# Patient Record
Sex: Male | Born: 1955
Health system: Southern US, Community
[De-identification: ages and names within clinical notes are randomized; demographics above are authoritative.]

## PROBLEM LIST (undated history)

## (undated) DIAGNOSIS — N200 Calculus of kidney: Secondary | ICD-10-CM

## (undated) DIAGNOSIS — M719 Bursopathy, unspecified: Secondary | ICD-10-CM

## (undated) DIAGNOSIS — E669 Obesity, unspecified: Secondary | ICD-10-CM

## (undated) DIAGNOSIS — E039 Hypothyroidism, unspecified: Secondary | ICD-10-CM

## (undated) DIAGNOSIS — Z5189 Encounter for other specified aftercare: Secondary | ICD-10-CM

## (undated) DIAGNOSIS — K219 Gastro-esophageal reflux disease without esophagitis: Secondary | ICD-10-CM

## (undated) DIAGNOSIS — B1920 Unspecified viral hepatitis C without hepatic coma: Secondary | ICD-10-CM

## (undated) DIAGNOSIS — L4050 Arthropathic psoriasis, unspecified: Secondary | ICD-10-CM

## (undated) DIAGNOSIS — M779 Enthesopathy, unspecified: Secondary | ICD-10-CM

## (undated) DIAGNOSIS — J45909 Unspecified asthma, uncomplicated: Secondary | ICD-10-CM

## (undated) DIAGNOSIS — I471 Supraventricular tachycardia, unspecified: Secondary | ICD-10-CM

## (undated) DIAGNOSIS — H269 Unspecified cataract: Secondary | ICD-10-CM

## (undated) DIAGNOSIS — T7840XA Allergy, unspecified, initial encounter: Secondary | ICD-10-CM

## (undated) DIAGNOSIS — G629 Polyneuropathy, unspecified: Secondary | ICD-10-CM

## (undated) DIAGNOSIS — K2970 Gastritis, unspecified, without bleeding: Secondary | ICD-10-CM

## (undated) DIAGNOSIS — K449 Diaphragmatic hernia without obstruction or gangrene: Secondary | ICD-10-CM

## (undated) DIAGNOSIS — M329 Systemic lupus erythematosus, unspecified: Secondary | ICD-10-CM

## (undated) DIAGNOSIS — E079 Disorder of thyroid, unspecified: Secondary | ICD-10-CM

## (undated) DIAGNOSIS — IMO0002 Reserved for concepts with insufficient information to code with codable children: Secondary | ICD-10-CM

## (undated) DIAGNOSIS — I10 Essential (primary) hypertension: Secondary | ICD-10-CM

## (undated) DIAGNOSIS — T859XXA Unspecified complication of internal prosthetic device, implant and graft, initial encounter: Secondary | ICD-10-CM

## (undated) DIAGNOSIS — J439 Emphysema, unspecified: Secondary | ICD-10-CM

## (undated) DIAGNOSIS — R011 Cardiac murmur, unspecified: Secondary | ICD-10-CM

## (undated) DIAGNOSIS — C801 Malignant (primary) neoplasm, unspecified: Secondary | ICD-10-CM

## (undated) DIAGNOSIS — M1990 Unspecified osteoarthritis, unspecified site: Secondary | ICD-10-CM

## (undated) DIAGNOSIS — D649 Anemia, unspecified: Secondary | ICD-10-CM

## (undated) DIAGNOSIS — J40 Bronchitis, not specified as acute or chronic: Secondary | ICD-10-CM

## (undated) DIAGNOSIS — M810 Age-related osteoporosis without current pathological fracture: Secondary | ICD-10-CM

## (undated) DIAGNOSIS — G4730 Sleep apnea, unspecified: Secondary | ICD-10-CM

## (undated) HISTORY — DX: Emphysema, unspecified: J43.9

## (undated) HISTORY — DX: Hypothyroidism, unspecified: E03.9

## (undated) HISTORY — DX: Arthropathic psoriasis, unspecified: L40.50

## (undated) HISTORY — DX: Systemic lupus erythematosus, unspecified: M32.9

## (undated) HISTORY — DX: Anemia, unspecified: D64.9

## (undated) HISTORY — DX: Unspecified asthma, uncomplicated: J45.909

## (undated) HISTORY — DX: Calculus of kidney: N20.0

## (undated) HISTORY — DX: Encounter for other specified aftercare: Z51.89

## (undated) HISTORY — PX: PATELLA ARTHROPLASTY: SUR73

## (undated) HISTORY — DX: Bursopathy, unspecified: M71.9

## (undated) HISTORY — DX: Polyneuropathy, unspecified: G62.9

## (undated) HISTORY — DX: Gastritis, unspecified, without bleeding: K29.70

## (undated) HISTORY — DX: Age-related osteoporosis without current pathological fracture: M81.0

## (undated) HISTORY — PX: JOINT REPLACEMENT: SHX530

## (undated) HISTORY — PX: SHOULDER ARTHROSCOPY: SHX128

## (undated) HISTORY — DX: Unspecified complication of internal prosthetic device, implant and graft, initial encounter: T85.9XXA

## (undated) HISTORY — DX: Malignant (primary) neoplasm, unspecified: C80.1

## (undated) HISTORY — DX: Unspecified cataract: H26.9

## (undated) HISTORY — DX: Supraventricular tachycardia: I47.1

## (undated) HISTORY — PX: SPINAL CORD STIMULATOR REMOVAL: SHX2423

## (undated) HISTORY — DX: Bronchitis, not specified as acute or chronic: J40

## (undated) HISTORY — DX: Gastro-esophageal reflux disease without esophagitis: K21.9

## (undated) HISTORY — DX: Unspecified osteoarthritis, unspecified site: M19.90

## (undated) HISTORY — DX: Essential (primary) hypertension: I10

## (undated) HISTORY — DX: Sleep apnea, unspecified: G47.30

## (undated) HISTORY — DX: Diaphragmatic hernia without obstruction or gangrene: K44.9

## (undated) HISTORY — DX: Obesity, unspecified: E66.9

## (undated) HISTORY — PX: SPINAL CORD STIMULATOR IMPLANT: SHX2422

## (undated) HISTORY — DX: Allergy, unspecified, initial encounter: T78.40XA

## (undated) HISTORY — DX: Enthesopathy, unspecified: M77.9

## (undated) HISTORY — PX: SPINE SURGERY: SHX786

## (undated) HISTORY — DX: Supraventricular tachycardia, unspecified: I47.10

## (undated) HISTORY — PX: EYE SURGERY: SHX253

## (undated) HISTORY — DX: Cardiac murmur, unspecified: R01.1

## (undated) HISTORY — DX: Unspecified viral hepatitis C without hepatic coma: B19.20

## (undated) HISTORY — PX: FRACTURE SURGERY: SHX138

## (undated) HISTORY — DX: Disorder of thyroid, unspecified: E07.9

## (undated) HISTORY — DX: Reserved for concepts with insufficient information to code with codable children: IMO0002

---

## 1998-11-22 ENCOUNTER — Ambulatory Visit (HOSPITAL_BASED_OUTPATIENT_CLINIC_OR_DEPARTMENT_OTHER): Admission: RE | Admit: 1998-11-22 | Discharge: 1998-11-22 | Payer: Self-pay | Admitting: Orthopedic Surgery

## 1999-01-02 ENCOUNTER — Ambulatory Visit (HOSPITAL_COMMUNITY): Admission: RE | Admit: 1999-01-02 | Discharge: 1999-01-02 | Payer: Self-pay | Admitting: Orthopedic Surgery

## 1999-01-02 ENCOUNTER — Encounter: Payer: Self-pay | Admitting: Orthopedic Surgery

## 1999-02-23 ENCOUNTER — Ambulatory Visit (HOSPITAL_BASED_OUTPATIENT_CLINIC_OR_DEPARTMENT_OTHER): Admission: RE | Admit: 1999-02-23 | Discharge: 1999-02-23 | Payer: Self-pay | Admitting: Orthopedic Surgery

## 1999-08-14 ENCOUNTER — Ambulatory Visit (HOSPITAL_BASED_OUTPATIENT_CLINIC_OR_DEPARTMENT_OTHER): Admission: RE | Admit: 1999-08-14 | Discharge: 1999-08-14 | Payer: Self-pay | Admitting: Orthopedic Surgery

## 2000-02-15 ENCOUNTER — Ambulatory Visit (HOSPITAL_BASED_OUTPATIENT_CLINIC_OR_DEPARTMENT_OTHER): Admission: RE | Admit: 2000-02-15 | Discharge: 2000-02-15 | Payer: Self-pay | Admitting: Orthopedic Surgery

## 2000-12-25 ENCOUNTER — Ambulatory Visit (HOSPITAL_BASED_OUTPATIENT_CLINIC_OR_DEPARTMENT_OTHER): Admission: RE | Admit: 2000-12-25 | Discharge: 2000-12-25 | Payer: Self-pay | Admitting: Orthopedic Surgery

## 2001-07-08 ENCOUNTER — Ambulatory Visit (HOSPITAL_BASED_OUTPATIENT_CLINIC_OR_DEPARTMENT_OTHER): Admission: RE | Admit: 2001-07-08 | Discharge: 2001-07-08 | Payer: Self-pay | Admitting: Orthopedic Surgery

## 2001-07-08 ENCOUNTER — Encounter (INDEPENDENT_AMBULATORY_CARE_PROVIDER_SITE_OTHER): Payer: Self-pay | Admitting: *Deleted

## 2001-10-07 ENCOUNTER — Ambulatory Visit (HOSPITAL_COMMUNITY): Admission: RE | Admit: 2001-10-07 | Discharge: 2001-10-07 | Payer: Self-pay | Admitting: Orthopedic Surgery

## 2001-10-07 ENCOUNTER — Encounter: Payer: Self-pay | Admitting: Orthopedic Surgery

## 2001-12-31 ENCOUNTER — Ambulatory Visit (HOSPITAL_BASED_OUTPATIENT_CLINIC_OR_DEPARTMENT_OTHER): Admission: RE | Admit: 2001-12-31 | Discharge: 2001-12-31 | Payer: Self-pay | Admitting: Orthopedic Surgery

## 2003-07-26 ENCOUNTER — Ambulatory Visit (HOSPITAL_COMMUNITY): Admission: RE | Admit: 2003-07-26 | Discharge: 2003-07-26 | Payer: Self-pay | Admitting: Orthopedic Surgery

## 2003-07-29 ENCOUNTER — Ambulatory Visit (HOSPITAL_BASED_OUTPATIENT_CLINIC_OR_DEPARTMENT_OTHER): Admission: RE | Admit: 2003-07-29 | Discharge: 2003-07-29 | Payer: Self-pay | Admitting: Orthopedic Surgery

## 2003-10-06 ENCOUNTER — Ambulatory Visit (HOSPITAL_BASED_OUTPATIENT_CLINIC_OR_DEPARTMENT_OTHER): Admission: RE | Admit: 2003-10-06 | Discharge: 2003-10-06 | Payer: Self-pay | Admitting: Orthopedic Surgery

## 2003-10-06 ENCOUNTER — Ambulatory Visit (HOSPITAL_COMMUNITY): Admission: RE | Admit: 2003-10-06 | Discharge: 2003-10-06 | Payer: Self-pay | Admitting: Orthopedic Surgery

## 2004-08-30 ENCOUNTER — Ambulatory Visit: Payer: Self-pay | Admitting: Anesthesiology

## 2004-09-26 ENCOUNTER — Ambulatory Visit: Payer: Self-pay | Admitting: Anesthesiology

## 2004-11-08 ENCOUNTER — Ambulatory Visit: Payer: Self-pay | Admitting: Anesthesiology

## 2004-12-12 ENCOUNTER — Ambulatory Visit: Payer: Self-pay | Admitting: Anesthesiology

## 2005-01-02 ENCOUNTER — Ambulatory Visit: Payer: Self-pay | Admitting: Anesthesiology

## 2005-01-24 ENCOUNTER — Ambulatory Visit: Payer: Self-pay | Admitting: Pain Medicine

## 2005-01-31 ENCOUNTER — Ambulatory Visit: Payer: Self-pay | Admitting: Pain Medicine

## 2005-02-07 ENCOUNTER — Ambulatory Visit: Payer: Self-pay | Admitting: Pain Medicine

## 2005-02-12 ENCOUNTER — Ambulatory Visit: Payer: Self-pay | Admitting: Anesthesiology

## 2005-03-20 ENCOUNTER — Ambulatory Visit: Payer: Self-pay | Admitting: Anesthesiology

## 2005-04-11 ENCOUNTER — Ambulatory Visit: Payer: Self-pay | Admitting: Pain Medicine

## 2005-04-18 ENCOUNTER — Ambulatory Visit: Payer: Self-pay | Admitting: Pain Medicine

## 2005-04-26 ENCOUNTER — Ambulatory Visit: Payer: Self-pay | Admitting: Pain Medicine

## 2005-05-16 ENCOUNTER — Ambulatory Visit: Payer: Self-pay | Admitting: Pain Medicine

## 2005-06-12 ENCOUNTER — Ambulatory Visit: Payer: Self-pay | Admitting: Pain Medicine

## 2005-07-18 ENCOUNTER — Ambulatory Visit: Payer: Self-pay | Admitting: Pain Medicine

## 2005-08-20 ENCOUNTER — Ambulatory Visit: Payer: Self-pay | Admitting: Physician Assistant

## 2005-09-17 ENCOUNTER — Ambulatory Visit: Payer: Self-pay | Admitting: Physician Assistant

## 2005-10-22 ENCOUNTER — Ambulatory Visit: Payer: Self-pay | Admitting: Physician Assistant

## 2005-11-21 ENCOUNTER — Ambulatory Visit: Payer: Self-pay | Admitting: Physician Assistant

## 2005-11-21 ENCOUNTER — Other Ambulatory Visit: Payer: Self-pay

## 2005-11-23 ENCOUNTER — Ambulatory Visit: Payer: Self-pay | Admitting: Pain Medicine

## 2005-11-30 ENCOUNTER — Ambulatory Visit: Payer: Self-pay | Admitting: Physician Assistant

## 2005-12-03 ENCOUNTER — Ambulatory Visit: Payer: Self-pay | Admitting: Pain Medicine

## 2005-12-24 ENCOUNTER — Ambulatory Visit: Payer: Self-pay | Admitting: Physician Assistant

## 2006-01-23 ENCOUNTER — Ambulatory Visit: Payer: Self-pay | Admitting: Pain Medicine

## 2006-02-20 ENCOUNTER — Ambulatory Visit: Payer: Self-pay | Admitting: Physician Assistant

## 2006-02-26 ENCOUNTER — Ambulatory Visit: Payer: Self-pay | Admitting: Pain Medicine

## 2006-03-05 ENCOUNTER — Ambulatory Visit: Payer: Self-pay | Admitting: Physician Assistant

## 2006-04-04 ENCOUNTER — Ambulatory Visit: Payer: Self-pay | Admitting: Physician Assistant

## 2006-05-01 ENCOUNTER — Ambulatory Visit: Payer: Self-pay | Admitting: Physician Assistant

## 2006-06-03 ENCOUNTER — Ambulatory Visit: Payer: Self-pay | Admitting: Physician Assistant

## 2006-06-25 ENCOUNTER — Ambulatory Visit: Payer: Self-pay | Admitting: Physician Assistant

## 2006-07-09 ENCOUNTER — Ambulatory Visit: Payer: Self-pay | Admitting: Pain Medicine

## 2006-07-31 ENCOUNTER — Ambulatory Visit: Payer: Self-pay | Admitting: Pain Medicine

## 2006-08-27 ENCOUNTER — Ambulatory Visit: Payer: Self-pay | Admitting: Physician Assistant

## 2006-10-07 ENCOUNTER — Ambulatory Visit: Payer: Self-pay | Admitting: Physician Assistant

## 2006-11-07 ENCOUNTER — Ambulatory Visit: Payer: Self-pay | Admitting: Physician Assistant

## 2006-12-06 ENCOUNTER — Ambulatory Visit: Payer: Self-pay | Admitting: Physician Assistant

## 2007-01-06 ENCOUNTER — Ambulatory Visit: Payer: Self-pay | Admitting: Physician Assistant

## 2007-01-14 ENCOUNTER — Ambulatory Visit: Payer: Self-pay | Admitting: Pain Medicine

## 2007-02-05 ENCOUNTER — Ambulatory Visit: Payer: Self-pay | Admitting: Physician Assistant

## 2007-03-10 ENCOUNTER — Ambulatory Visit: Payer: Self-pay | Admitting: Physician Assistant

## 2007-04-10 ENCOUNTER — Ambulatory Visit: Payer: Self-pay | Admitting: Physician Assistant

## 2007-05-13 ENCOUNTER — Ambulatory Visit: Payer: Self-pay | Admitting: Physician Assistant

## 2007-06-12 ENCOUNTER — Ambulatory Visit: Payer: Self-pay | Admitting: Physician Assistant

## 2007-07-11 ENCOUNTER — Ambulatory Visit: Payer: Self-pay | Admitting: Physician Assistant

## 2007-07-25 ENCOUNTER — Ambulatory Visit: Payer: Self-pay | Admitting: Physician Assistant

## 2007-08-26 ENCOUNTER — Ambulatory Visit: Payer: Self-pay | Admitting: Pain Medicine

## 2007-09-10 ENCOUNTER — Ambulatory Visit: Payer: Self-pay | Admitting: Physician Assistant

## 2007-10-09 ENCOUNTER — Ambulatory Visit: Payer: Self-pay | Admitting: Physician Assistant

## 2007-11-06 HISTORY — PX: COLONOSCOPY: SHX174

## 2007-11-14 ENCOUNTER — Ambulatory Visit: Payer: Self-pay | Admitting: Physician Assistant

## 2007-12-16 ENCOUNTER — Ambulatory Visit: Payer: Self-pay | Admitting: Physician Assistant

## 2008-01-20 ENCOUNTER — Ambulatory Visit: Payer: Self-pay | Admitting: Physician Assistant

## 2008-08-11 ENCOUNTER — Ambulatory Visit: Payer: Self-pay | Admitting: Pain Medicine

## 2008-08-31 ENCOUNTER — Encounter: Admission: RE | Admit: 2008-08-31 | Discharge: 2008-08-31 | Payer: Self-pay | Admitting: Pain Medicine

## 2008-09-13 ENCOUNTER — Ambulatory Visit: Payer: Self-pay | Admitting: Pain Medicine

## 2008-09-16 ENCOUNTER — Ambulatory Visit: Payer: Self-pay | Admitting: Pain Medicine

## 2008-10-07 ENCOUNTER — Ambulatory Visit: Payer: Self-pay | Admitting: Physician Assistant

## 2008-10-25 ENCOUNTER — Ambulatory Visit: Payer: Self-pay | Admitting: Pain Medicine

## 2008-11-09 ENCOUNTER — Ambulatory Visit: Payer: Self-pay | Admitting: Physician Assistant

## 2008-11-16 ENCOUNTER — Ambulatory Visit: Payer: Self-pay | Admitting: Pain Medicine

## 2008-12-08 ENCOUNTER — Ambulatory Visit: Payer: Self-pay | Admitting: Physician Assistant

## 2008-12-13 ENCOUNTER — Ambulatory Visit: Payer: Self-pay | Admitting: Pain Medicine

## 2009-01-11 ENCOUNTER — Ambulatory Visit: Payer: Self-pay | Admitting: Physician Assistant

## 2009-04-07 ENCOUNTER — Ambulatory Visit: Payer: Self-pay | Admitting: Physician Assistant

## 2009-05-31 ENCOUNTER — Ambulatory Visit: Payer: Self-pay | Admitting: Physician Assistant

## 2009-06-07 ENCOUNTER — Ambulatory Visit: Payer: Self-pay | Admitting: Pain Medicine

## 2009-06-14 ENCOUNTER — Ambulatory Visit: Payer: Self-pay | Admitting: Physician Assistant

## 2009-06-22 ENCOUNTER — Ambulatory Visit: Payer: Self-pay | Admitting: Pain Medicine

## 2009-10-06 ENCOUNTER — Ambulatory Visit: Payer: Self-pay | Admitting: Physician Assistant

## 2009-10-11 ENCOUNTER — Ambulatory Visit: Payer: Self-pay | Admitting: Pain Medicine

## 2009-10-24 ENCOUNTER — Ambulatory Visit: Payer: Self-pay

## 2009-11-02 ENCOUNTER — Ambulatory Visit: Payer: Self-pay | Admitting: Physician Assistant

## 2009-11-17 ENCOUNTER — Ambulatory Visit: Payer: Self-pay | Admitting: Pain Medicine

## 2009-12-15 ENCOUNTER — Ambulatory Visit: Payer: Self-pay | Admitting: Pain Medicine

## 2009-12-29 ENCOUNTER — Ambulatory Visit: Payer: Self-pay | Admitting: Unknown Physician Specialty

## 2010-01-17 ENCOUNTER — Ambulatory Visit: Payer: Self-pay | Admitting: Unknown Physician Specialty

## 2010-02-07 ENCOUNTER — Ambulatory Visit: Payer: Self-pay | Admitting: Unknown Physician Specialty

## 2010-04-05 ENCOUNTER — Ambulatory Visit: Payer: Self-pay | Admitting: Pain Medicine

## 2010-06-29 ENCOUNTER — Emergency Department: Payer: Self-pay | Admitting: Emergency Medicine

## 2010-10-26 ENCOUNTER — Ambulatory Visit: Payer: Self-pay | Admitting: Pain Medicine

## 2010-11-09 ENCOUNTER — Ambulatory Visit: Payer: Self-pay | Admitting: Pain Medicine

## 2010-11-27 ENCOUNTER — Ambulatory Visit: Payer: Self-pay | Admitting: Pain Medicine

## 2010-11-30 ENCOUNTER — Ambulatory Visit: Payer: Self-pay | Admitting: Pain Medicine

## 2010-12-04 ENCOUNTER — Ambulatory Visit: Payer: Self-pay | Admitting: Pain Medicine

## 2010-12-13 ENCOUNTER — Ambulatory Visit: Payer: Self-pay | Admitting: Pain Medicine

## 2011-01-18 ENCOUNTER — Ambulatory Visit: Payer: Self-pay | Admitting: Pain Medicine

## 2011-01-25 ENCOUNTER — Ambulatory Visit: Payer: Self-pay | Admitting: Pain Medicine

## 2011-02-06 ENCOUNTER — Ambulatory Visit: Payer: Self-pay | Admitting: Pain Medicine

## 2011-02-13 ENCOUNTER — Ambulatory Visit: Payer: Self-pay | Admitting: Pain Medicine

## 2011-02-20 ENCOUNTER — Ambulatory Visit: Payer: Self-pay | Admitting: Pain Medicine

## 2011-03-07 ENCOUNTER — Ambulatory Visit: Payer: Self-pay | Admitting: Pain Medicine

## 2011-03-23 NOTE — Op Note (Signed)
[place]. [facility]  Patient:    James [name], James [name] Visit Number: [id number] MRN: [id number]          Service Type: DSU Location: [facility] Attending Physician:  Ronne Binning Dictated by:   Nicki Reaper, M.D. Proc. Date: 07/08/01 Admit Date:  07/08/2001                             Operative Report  PREOPERATIVE DIAGNOSIS:  Status post reconstruction, right forearm, with pronator syndrome, synovitis posterior elbow.  POSTOPERATIVE DIAGNOSIS:  Status post reconstruction, right forearm, with pronator syndrome, synovitis posterior elbow.  PROCEDURES:  Release right pronator, median nerve, partial synovectomy right elbow.  SURGEON:  Nicki Reaper, M.D.  ASSISTANT:  Joaquin Courts, R.N.  ANESTHESIA:  Axillary block.  ANESTHESIOLOGIST:  Guadalupe Maple, M.D.  HISTORY:  The patient is a 55 year old male with a history of a _____ injury of his right elbow.  He has undergone multiple procedures, including radial head excision, reconstruction of the forearm, titanium prosthesis with removal.  He is admitted now for release of pronator and debridement of posterior compartment of his right elbow, having had an MRI revealing a discrete fullness.  DESCRIPTION OF PROCEDURE:  The patient was brought to the operating room, where an axillary block was carried out without difficulty.  He was prepped and draped using Betadine scrub and solution with the right arm free.  The limb was exsanguinated with an Esmarch bandage, tourniquet placed in high in the arm was inflated to 250 mmHg.  The pronator was approached first and the anterior incision made over the medial side of the antecubital fossa, carried down through subcutaneous tissue.  Bleeders were electrocauterized.  The medial brachiocutaneous nerve of the forearm was protected, the dissection carried down.  The lacertus fibrosus was incised, the dissection carried down to the artery and nerve, which were  immediately apparent.  The dissection carried distally.  A very tight band of the superficial head of the pronator was noted with a fibrous arcade.  The arcade was entirely released.  No further lesions were identified.  The nerve was without further compression. The wound was irrigated.  The subcutaneous tissue was closed with interrupted 4-0 Vicryl and the skin with interrupted 5-0 nylon sutures.  The lateral elbow was approached next, the old incision used, extended proximally, carried down through subcutaneous tissue.  The interval between the brachialis and the triceps was then opened with blunt and sharp dissection.  This was dissected down to the joint.  The joint was opened posteriorly.  A large area of fibrofatty tissue was present within the posterior compartment.  This was removed with rongeur.  Very mild degenerative changes were present.  The specimen was sent to pathology.  The anterior radiocapitellar area was then exposed from the posterior aspect and a partial synovectomy performed.  There was no area of discrete compression present.  The wound was copiously irrigated with saline, the interval closed of the synovial tissue with figure-of-eight 4-0 Vicryl sutures, the triceps fascia with 4-0 Vicryl, the subcutaneous tissue with 4-0 Vicryl, and the skin with interrupted 5-0 nylon sutures.  A sterile compressive dressing and long-arm splint were applied, and the patient tolerated the procedure well and was taken to the recovery room for observation in satisfactory condition.  He is discharged home, to return to the [facility] [place] in one week on Vicodin and Septra DS. Dictated by:  Nicki Reaper, M.D. Attending Physician:  Ronne Binning DD:  07/08/01 TD:  07/08/01 Job: [id number]

## 2011-03-23 NOTE — Op Note (Signed)
[place]. [facility]  Patient:    James [name], James [name]                       MRN: [id number] Proc. Date: 02/15/00 Attending:  Nicki Reaper, M.D. Dictator:   Nicki Reaper, M.D. CC:         Nicki Reaper, M.D. 2 copies                           Operative Report  PREOPERATIVE DIAGNOSIS:  Ulnar nerve palsy, right elbow.  POSTOPERATIVE DIAGNOSIS:  Ulnar nerve palsy, right elbow.  PROCEDURE:  Subcutaneous transposition of right ulnar nerve.  SURGEON:  Dr. Merlyn Lot.  ASSISTANT:  ______.  ANESTHESIA:  General.  ANESTHESIOLOGIST:  Dr. Sondra Come.  HISTORY OF PRESENT ILLNESS:  The patient is a 55 year old male with a history of injury to his right arm with a ______ injury.  He has undergone multiple procedures of this, and is now for subcutaneous transposition of ulnar nerve. MG nerve conduction has been positive, and he has not responded to conservative treatment.  DESCRIPTION OF PROCEDURE:  The patient was brought to the operating room where  general anesthesia was carried out without difficulty.  He was prepped and draped using Betadine scrubbing solution with the right arm free.  The limb was exsanguinated with an Esmarch bandage.  Tourniquet placed high on the arm.  It as inflated to 250 mmHg.  A curvilinear incision was made over the medial epicondyle, carried down through subcutaneous tissue.  Bleeders were electrocauterized. The dissection carried down to the ulnar nerve.  The lateral brachial cutaneous nerve of the forearm was identified and protected.  The fascia over the ulnar nerve was released.  Significant scarring was present with effusions through the canal where it entered the flexor carpi ulnaris.  A fasciotomy of the flexor carpi ulnaris as released.  A release of the tissue surrounding the nerve was released.  The ______ of ______ was also released proximally.  The nerve was then easily able to be transposed anteriorly.  The medial  intermuscular septum was excised, attached to the medial epicondyle.  A portion of the flexor carpi ulnaris origin was also elevated.  The nerve root was transposed anteriorly.  The wound irrigated.  The  medial intermuscular septum was then sutured across anterior to the fascia of the flexor muscle mass holding the nerve anteriorly.  The flexor carpi ulnaris origin was then sutured to the subcutaneous tissue.  This was done with figure-of-eight 4-0 Vicryl suture.  The nerve was held into position through full flexion and full extension anterior to the epicondyle.  The wound was again irrigated. Subcutaneous tissue closed with interrupted 4-0 Vicryl, and the skin with subcuticular 3-0 monocryl sutures.  Steri-Strips were applied.  Sterile compressive dressing and  long arm splint applied.  The patient tolerated the procedure well, and was taken to the recovery room for observation in satisfactory condition.  He is discharged home to return to the [facility] [place] in one week.  DISCHARGE MEDICATIONS:  Tylox and Septra DS. DD:  02/15/00 TD:  02/15/00 Job: [id number]

## 2011-03-23 NOTE — Op Note (Signed)
[place]. [facility]  Patient:    James [name]                      MRN: [id number] Proc. Date: 08/14/99 Adm. Date:  [id number] Attending:  Ronne Binning CC:         Nicki Reaper, M.D. (2)                           Operative Report  PREOPERATIVE DIAGNOSIS:  Carpal tunnel syndrome, left hand.  Tight scar, left thumb.  POSTOPERATIVE DIAGNOSIS:  Carpal tunnel syndrome, left hand.  Tight scar, left thumb.  OPERATION:  1. Z-plasty, left thumb scar.  2. Carpal tunnel release, left hand.  SURGEON:  Nicki Reaper, M.D.  ASSISTANT:  Joaquin Courts, R.N.  ANESTHESIA:  Forearm-based IV regional.  ANESTHESIOLOGIST:  Halford Decamp, M.D.  HISTORY:  The patient is a 55 year old male with a history of injury to his left hand.  He has undergone multiple procedures in the past for _______ ________. e is admitted now for carpal tunnel release.  He has not responded to conservative treatment.  EMG nerve conductions are positive.  DESCRIPTION OF PROCEDURE:  The patient was brought to the operating room where  forearm-based IV regional anesthetic was carried out without difficulty.  He was prepped and draped using Betadine scrub and solution, with the left arm free.  longitudinal incision was made in the palm, carried down through subcutaneous tissue.  Bleeders were electrocauterized.  The palmar fascia was split.  The superficial palmar arch identified.  The flexor tendon of the ring and little finger identified to the ulnar side of the median nerve.  The carpal retinaculum was incised with sharp dissection.  A right-angle and Sewall retractor were placed between skin and forearm fascia.  The fascia released for approximately 3 cm proximal to the wrist crease.  A tight band was still apparent due to the tightness.  A separate incision was then made on the volar forearm approximately 0.5 cm in length, carried down through the subcutaneous tissue.  The  remaining fascia was then released with without difficulty protecting the nerve.  No further lesions were identified.  Tenosynovial tissue was moderately thickened.  The wound was irrigated.  Skin was closed with interrupted 5-0 nylon sutures.  A separate  incision was then made over the scar in line with two Z-plasties.  These were deepened, elevated through the subcutaneous tissue.  These were easily transposed, the thumb pulled into further extension.  These were then sutured into position  with interrupted 5-0 nylon sutures after irrigation.  A sterile compressive dressing and splint was applied.  The patient tolerated the procedure well and was taken to the recovery room for  observation in satisfactory condition.  He is discharged home to return to [facility] [place] in one week on  [place] NX and Septra DS. DD:  08/14/99 TD:  08/14/99 Job: [id number]

## 2011-03-23 NOTE — Op Note (Signed)
[place]. [facility]  Patient:    James [name], James [name]                     MRN: [id number] Proc. Date: 12/25/00 Adm. Date:  [id number] Attending:  Ronne Binning                           Operative Report  PREOPERATIVE DIAGNOSIS:  Status post Essex-Lopresti reconstruction of right elbow.  POSTOPERATIVE DIAGNOSIS:  Status post Essex-Lopresti reconstruction of right elbow.  OPERATION PERFORMED:  Removal of radial head prosthesis, right elbow.  SURGEON:  Nicki Reaper, M.D.  ASSISTANTCarolyne Fiscal, RN  ANESTHESIA:  Axillary block.  ANESTHESIOLOGIST:  Dr.Singer.  INDICATIONS FOR PROCEDURE:  The patient is a 55 year old male with a history of an Essex-Lopresti injury to his right arm.  He has undergone reconstruction with interosseous membrane reconstruction, ulnar shortening, triangular fibrocartilage repair and radial high prosthesis.  He has had continued pain in the radial capitellar joint.  He is brought in now for removal of the prosthesis.  DESCRIPTION OF PROCEDURE:  The patient was brought to the operating room where an axillary block was carried out without difficulty.  He was prepped and draped using Betadine scrub and solution with the right arm free.  The limb was exsanguinated with an Esmarch bandage.  Tourniquet placed high on the arm was inflated to 250 mmHg.  The old incision was used and carried down through subcutaneous tissues.  Bleeders were electrocauterized.  The old interval between the anconeus extensors was split.  The dissection was carried down to the prosthesis.  A very significant capsule had formed about this.  This was partially excised revealing the radial head prosthesis.  Significant wear of the capitellum was evident with near complete loss of cartilage in the central aspect.  Retractors were placed.  The prosthesis was then removed with a mild amount of difficulty by positioning the elbow.  No further lesions  were identified.  The wound was copiously irrigated with saline.  The capsule was closed with figure-of-eight 4-0 [place] sutures.  The interval in the extensors closed with figure-of-eight 4-0 M[place]ilene.  The subcutaneous tissues with 4-0 Vicryl and the skin with subcuticular 4-0 Monocryl suture. Steri-Strips were applied.  Sterile compressive dressing and long arm splint applied.  The patient tolerated the procedure well and was taken to the recovery room for observation in satisfactory condition.  He is discharged home to return to the [facility] [place] in one week on [place] and Septra. DD:  12/25/00 TD:  12/25/00 Job: [id number]

## 2011-03-23 NOTE — Op Note (Signed)
NAME:  James [name], James [name]                        ACCOUNT NO.:  [id number]   MEDICAL RECORD NO.:  [id number]                   PATIENT TYPE:  AMB   LOCATION:  DSC                                  FACILITY:  [facility]   PHYSICIAN:  Cindee Salt, M.D.                    DATE OF BIRTH:  [birth year]   DATE OF PROCEDURE:  10/06/2003  DATE OF DISCHARGE:                                 OPERATIVE REPORT   PREOPERATIVE DIAGNOSIS:  Carpal tunnel syndrome, right hand.   POSTOPERATIVE DIAGNOSIS:  Carpal tunnel syndrome, right hand.   PROCEDURE:  Decompression of right median nerve.   SURGEON:  Cindee Salt, M.D.   ANESTHESIA:  Foreign based IV regional.   INDICATIONS FOR PROCEDURE:  The patient is a 55 year old male with a history  of Essex-Lopresti injury carpal tunnel syndrome, right hand.  This has not  responded to conservative treatment.  He has undergone reconstruction of the  Essex-Lopresti.  He is brought in now for release of his carpal tunnel.   DESCRIPTION OF PROCEDURE:  The patient is brought to the operating room  where a foreign based IV regional anesthetic was carried out without  difficulty.  It was prepped using Dura-Prep in the supine position, right  arm free.  A longitudinal incision was made in the palm carried down through  subcutaneous tissue.  Bleeders were electrocauterized.  Palmar fascia was  split.  Superficial palmar arch identified.  The flexor tendon to the ring  and little finger identified to the ulnar side of the median nerve.  The  carpal retinaculum was incised with sharp dissection.  A right angle  retractor was placed between skin and forearm fascia.  The fascia was  released for approximately 3 cm proximal to the wrist crease under direct  vision.  Canal was tight.  The compression of the nerve was apparent.  The  wound was irrigated.  The skin was closed with interrupted 5-0 nylon  sutures.  Sterile compressive dressing and splint were applied.  The patient  tolerated the procedure well and was taken to the recovery room for  observation in satisfactory condition.  He was given a ganglion block prior  to the procedure.  He is discharged on Percocet and Septra DS.                                               Cindee Salt, M.D.    GK/MEDQ  D:  10/06/2003  T:  10/06/2003  Job:  [id number]

## 2011-03-23 NOTE — Op Note (Signed)
[place]. [facility]  Patient:    James, [name] Visit Number: [id number] MRN: [id number]          Service Type: DSU Location: [facility] Attending Physician:  Ronne Binning Dictated by:   Nicki Reaper, M.D. Proc. Date: 12/31/01 Admit Date:  12/31/2001                             Operative Report  PREOPERATIVE DIAGNOSIS:  Status post ______ reconstruction with regrowth of proximal radius.  POSTOPERATIVE DIAGNOSIS:  Status post ______ reconstruction with regrowth of proximal radius.  OPERATION:  Excision of proximal radius exostosis, right arm.  SURGEON:  Nicki Reaper, M.D.  ASSISTANT:  Joaquin Courts, R.N.  ANESTHESIA:  Axillary and general.  ANESTHESIOLOGIST:  Maren Beach, M.D.  HISTORY:  The patient is a 55 year old male who suffered an injury to his right forearm, known as an ______.  He has undergone reconstruction.  He has done well, however, has developed a regrowth of the proximal radius and has had pain with pronation and supination.  DESCRIPTION OF PROCEDURE:  He was brought to the operating room where an axillary block was carried out without difficulty.  He was prepped and draped using Betadine scrubbing solution with the right arm free.  The block was not entirely effective; general anesthesia was then provided.  The old incision was used and carried down through subcutaneous tissue.  Bleeders were electrocauterized.  Dissection was carried down to the radiocapitellar joint. The joint was opened.  Significant scarring was present throughout the joint cavity.  The loss of cartilage on the capitellum was immediately apparent. The proximal aspect of the radius was then identified.  This was found to impinge on the ulna.  The proximal exostosis of the radius and portion of the radial neck were then removed using rongeur.  Adequate decompression was confirmed on x-ray.  The arm was then placed under compression and no  proximal migration of the radius was noted; this was confirmed on OEC.  The wound was copiously irrigated with saline.  The proximal radius was then covered with bone wax, excess was removed, the joint irrigated with saline and the capsule layers closed with 4-0 Vicryl and the skin with interrupted subcuticular 4-0 Monocryl sutures.  Steri-Strips were applied.  A sterile compressive dressing was applied along with sling.  The patient tolerated the procedure well and was taken to the recovery room for observation in satisfactory condition.  He is discharged home to return to the [facility] [place] in one week on Percocet and Keflex. Dictated by:   Nicki Reaper, M.D. Attending Physician:  Ronne Binning DD:  12/31/01 TD:  12/31/01 Job: [id number]

## 2011-04-10 ENCOUNTER — Ambulatory Visit: Payer: Self-pay | Admitting: Cardiology

## 2011-09-13 ENCOUNTER — Ambulatory Visit: Payer: Self-pay | Admitting: Pain Medicine

## 2011-09-20 ENCOUNTER — Ambulatory Visit: Payer: Self-pay | Admitting: Pain Medicine

## 2011-10-01 ENCOUNTER — Ambulatory Visit: Payer: Self-pay | Admitting: Pain Medicine

## 2011-10-08 ENCOUNTER — Ambulatory Visit: Payer: Self-pay | Admitting: Pain Medicine

## 2011-10-15 ENCOUNTER — Ambulatory Visit: Payer: Self-pay | Admitting: Pain Medicine

## 2011-11-14 ENCOUNTER — Ambulatory Visit: Payer: Self-pay | Admitting: Pain Medicine

## 2012-01-11 ENCOUNTER — Ambulatory Visit: Payer: Self-pay | Admitting: Unknown Physician Specialty

## 2012-03-10 ENCOUNTER — Ambulatory Visit: Payer: Self-pay | Admitting: Pain Medicine

## 2012-04-03 ENCOUNTER — Ambulatory Visit: Payer: Self-pay | Admitting: Orthopedic Surgery

## 2012-07-01 ENCOUNTER — Ambulatory Visit: Payer: Self-pay | Admitting: Pain Medicine

## 2012-07-03 ENCOUNTER — Ambulatory Visit: Payer: Self-pay | Admitting: Pain Medicine

## 2012-07-17 ENCOUNTER — Ambulatory Visit: Payer: Self-pay | Admitting: Pain Medicine

## 2012-07-31 ENCOUNTER — Ambulatory Visit: Payer: Self-pay | Admitting: Pain Medicine

## 2012-08-07 ENCOUNTER — Ambulatory Visit: Payer: Self-pay | Admitting: Pain Medicine

## 2012-08-11 ENCOUNTER — Ambulatory Visit: Payer: Self-pay | Admitting: Family Medicine

## 2012-08-21 ENCOUNTER — Ambulatory Visit: Payer: Self-pay | Admitting: Pain Medicine

## 2012-11-11 ENCOUNTER — Ambulatory Visit: Payer: Self-pay | Admitting: Family Medicine

## 2012-12-01 ENCOUNTER — Ambulatory Visit: Payer: Self-pay | Admitting: Pain Medicine

## 2013-03-19 ENCOUNTER — Ambulatory Visit: Payer: Self-pay | Admitting: Pain Medicine

## 2013-04-02 ENCOUNTER — Ambulatory Visit: Payer: Self-pay | Admitting: Pain Medicine

## 2013-04-03 ENCOUNTER — Ambulatory Visit: Payer: Self-pay | Admitting: Pain Medicine

## 2013-04-08 ENCOUNTER — Other Ambulatory Visit: Payer: Self-pay | Admitting: Pain Medicine

## 2013-04-08 LAB — MAGNESIUM: Magnesium: 1.8 mg/dL

## 2013-04-16 ENCOUNTER — Ambulatory Visit: Payer: Self-pay | Admitting: Pain Medicine

## 2013-04-23 ENCOUNTER — Ambulatory Visit: Payer: Self-pay | Admitting: Pain Medicine

## 2013-04-30 ENCOUNTER — Ambulatory Visit: Payer: Self-pay | Admitting: Pain Medicine

## 2013-05-13 ENCOUNTER — Ambulatory Visit: Payer: Self-pay | Admitting: Pain Medicine

## 2013-05-26 ENCOUNTER — Ambulatory Visit: Payer: Self-pay | Admitting: Pain Medicine

## 2013-06-15 ENCOUNTER — Ambulatory Visit: Payer: Self-pay | Admitting: Pain Medicine

## 2013-06-23 ENCOUNTER — Ambulatory Visit: Payer: Self-pay | Admitting: Pain Medicine

## 2013-07-10 ENCOUNTER — Ambulatory Visit: Payer: Self-pay | Admitting: Pain Medicine

## 2013-08-21 ENCOUNTER — Ambulatory Visit: Payer: Self-pay | Admitting: Pain Medicine

## 2013-09-04 ENCOUNTER — Ambulatory Visit: Payer: Self-pay | Admitting: Pain Medicine

## 2013-11-13 ENCOUNTER — Ambulatory Visit: Payer: Self-pay | Admitting: Pain Medicine

## 2014-02-24 ENCOUNTER — Other Ambulatory Visit: Payer: Self-pay | Admitting: Pain Medicine

## 2014-02-24 ENCOUNTER — Ambulatory Visit: Payer: Self-pay | Admitting: Pain Medicine

## 2014-02-24 LAB — BASIC METABOLIC PANEL
Anion Gap: 2 — ABNORMAL LOW (ref 7–16)
BUN: 15 mg/dL (ref 7–18)
CHLORIDE: 105 mmol/L (ref 98–107)
Calcium, Total: 9 mg/dL (ref 8.5–10.1)
Co2: 31 mmol/L (ref 21–32)
Creatinine: 1.14 mg/dL (ref 0.60–1.30)
EGFR (African American): >60
EGFR (Non-African Amer.): >60
Glucose: 95 mg/dL (ref 65–99)
OSMOLALITY: 276 (ref 275–301)
Potassium: 4.3 mmol/L (ref 3.5–5.1)
Sodium: 138 mmol/L (ref 136–145)

## 2014-02-24 LAB — MAGNESIUM: Magnesium: 2.1 mg/dL

## 2014-02-24 LAB — SEDIMENTATION RATE: Erythrocyte Sed Rate: 1 mm/hr (ref 0–20)

## 2014-03-15 DIAGNOSIS — I10 Essential (primary) hypertension: Secondary | ICD-10-CM | POA: Insufficient documentation

## 2014-03-19 DIAGNOSIS — I491 Atrial premature depolarization: Secondary | ICD-10-CM | POA: Insufficient documentation

## 2014-03-19 DIAGNOSIS — R002 Palpitations: Secondary | ICD-10-CM | POA: Insufficient documentation

## 2014-03-19 DIAGNOSIS — I471 Supraventricular tachycardia: Secondary | ICD-10-CM | POA: Insufficient documentation

## 2014-03-19 DIAGNOSIS — R931 Abnormal findings on diagnostic imaging of heart and coronary circulation: Secondary | ICD-10-CM | POA: Insufficient documentation

## 2014-03-23 ENCOUNTER — Ambulatory Visit: Payer: Self-pay | Admitting: Pain Medicine

## 2014-03-26 DIAGNOSIS — I4891 Unspecified atrial fibrillation: Secondary | ICD-10-CM | POA: Insufficient documentation

## 2014-04-14 ENCOUNTER — Ambulatory Visit: Payer: Self-pay | Admitting: Pain Medicine

## 2014-04-27 ENCOUNTER — Ambulatory Visit: Payer: Self-pay | Admitting: Pain Medicine

## 2014-04-30 ENCOUNTER — Ambulatory Visit: Payer: Self-pay | Admitting: Pain Medicine

## 2014-04-30 ENCOUNTER — Ambulatory Visit: Payer: Self-pay | Admitting: Gastroenterology

## 2014-06-04 ENCOUNTER — Ambulatory Visit: Payer: Self-pay | Admitting: Gastroenterology

## 2014-06-04 DIAGNOSIS — K2970 Gastritis, unspecified, without bleeding: Secondary | ICD-10-CM | POA: Insufficient documentation

## 2014-06-07 ENCOUNTER — Ambulatory Visit: Payer: Self-pay | Admitting: Pain Medicine

## 2014-06-07 LAB — PATHOLOGY REPORT

## 2014-09-17 ENCOUNTER — Ambulatory Visit: Payer: Self-pay | Admitting: Pain Medicine

## 2014-12-15 ENCOUNTER — Ambulatory Visit: Payer: Self-pay | Admitting: Unknown Physician Specialty

## 2015-02-04 ENCOUNTER — Other Ambulatory Visit: Payer: Self-pay

## 2015-02-04 NOTE — Patient Outreach (Signed)
[facility]) Care Management  02/04/2015  James [name] [birth year] [id number]   RN CM attempted to reach patient to discuss [facility] services. Patient was unavailable and HIPPA compliant voice mail message left with call back number.  RN CM will try again at a later date.

## 2015-02-07 ENCOUNTER — Other Ambulatory Visit: Payer: Self-pay

## 2015-02-07 DIAGNOSIS — I509 Heart failure, unspecified: Secondary | ICD-10-CM

## 2015-02-07 NOTE — Patient Outreach (Signed)
[facility]) Care Management  02/07/2015  James [name] [birth year] [id number]   Patient returned call.  RN CM explained the services of [facility].  Patient states he has various chronic diseases.  Patient states he has an enlarged heart with irregular beats and the cause is unknown.  Patient states he was a former smoker however he has quit many years ago.  Patient states his diabetes is under control with his bloods in 80-90 range. Patient states he understands how to self-manage his chronic diseases.  States his wife is a Merchandiser, retail and she is involved in his care.  Patient decline the services of [facility], however he asked to speak with our pharmacist.  Stated he has questions about medications and his intolerance to certain medication.  RN CM will make a referral to pharmacy for medication inquires.  RN CM will close this case for nursing involvement.  Maury Dus, RN, Ishmael Holter, [place] Telephonic Care Coordinator [phone]

## 2015-02-08 ENCOUNTER — Other Ambulatory Visit: Payer: Self-pay

## 2015-02-11 ENCOUNTER — Other Ambulatory Visit: Payer: Self-pay | Admitting: Pharmacist

## 2015-02-11 NOTE — Patient Outreach (Signed)
Mr. Retz was referred to me to follow up with about medication questions and about medication intolerance.   Patient reports intolerance to NSAIDs, even topicals such as diclofenac cream. Reports that he is currently taking Protonix and still has stomach acid complications when using any NSAID medications. Patient reports that both his PCP, Dr. Lovie Macadamia, as well as his GI physician have instructed him to not use any form of NSAIDs due to this intolerance.  Patient reports having arthritis and a variety of other sources of pain and asks about alternative methods for pain control, particularly when inflammation is present. Reviewed with patient the different classes of medications, including acetaminophen, opioids and steroids. Patient reports that he is chronically on both opioids and steroids. Further reviewed specific options within these classes and their relative benefits/risks. Patient verbalized understanding.   Harlow Asa, PharmD Clinical Pharmacist [facility] [phone]

## 2015-02-14 NOTE — Patient Outreach (Signed)
[facility]) Care Management  02/14/2015  James [name] 05-16-1956 [id number]   Received notification from [facility] and Harlow Asa to close case.  Patient refused to participate with [facility] services.  Case closed at this time.  Ronnell Freshwater. [place] CM Assistant Phone: [phone] Fax: [phone]

## 2015-02-27 NOTE — Op Note (Signed)
PATIENT NAME:  James [name], James [name] MR#:  [id number] DATE OF BIRTH:  11-29-1955  DATE OF PROCEDURE:  01/11/2012  PREOPERATIVE DIAGNOSIS: Internal derangement, left knee.   POSTOPERATIVE DIAGNOSES:  1. Complex tear medial meniscus.  2. Complex tear lateral meniscus.  3. Grade 3 patellofemoral chondromalacia.   PROCEDURES PERFORMED:  1. Diagnostic and operative arthroscopy, partial medial and lateral meniscectomy, left knee.  2. Chondroplasty and shaving patellofemoral joint, left knee.   SURGEON: Alysia Penna. Shaylin Blatt, MD  ASSISTANT: None.   ANESTHESIA: General.   ESTIMATED BLOOD LOSS: Negligible.   COMPLICATIONS: None.   BRIEF CLINICAL NOTE AND PATHOLOGY: Patient had persistent knee pain with mechanical symptoms. Work-up was compatible with internal derangement. Options, risks, and benefits were discussed and patient elected to proceed with operative intervention. At the time of the procedure, approximately 50% of the medial meniscus and 30% of the lateral meniscus were removed. There were grade 3 chondral changes about the patellofemoral joint. There were only mild chondral changes about the medial and lateral compartments.   DESCRIPTION OF PROCEDURE: Preop antibiotics, adequate general anesthesia, supine position, routine prepping and draping. Appropriate timeout was called. The knee was injected with Marcaine with epinephrine. Cannula introduced thru routine lateral joint line portal. Medial portal made with a spinal needle. Partial medial and lateral meniscectomies were performed with a punch motorized shaver and ArthroWand. Attention then turned to the patellofemoral joint were chondroplastic shaving was performed using the bur and the ArthroWand. This took approximately 10 minutes. The knee again thoroughly irrigated and reinspected. No further pathology noted. Again thoroughly irrigated and drained of fluid. Portals were closed with subcuticular Vicryl and skin glue. Knee injected with  Marcaine with epinephrine. Soft sterile dressing was applied. Patient was awakened, taken to the postanesthesia care unit having tolerated procedure well.   ____________________________ Alysia Penna. Mauri Pole, MD jcc:cms D: 01/11/2012 15:42:41 ET T: 01/11/2012 16:05:00 ET JOB#: [id number]  cc: Alysia Penna. Mauri Pole, MD, <Dictator>  Alysia Penna [name] MD ELECTRONICALLY SIGNED 01/15/2012 8:27

## 2015-03-30 ENCOUNTER — Other Ambulatory Visit: Payer: Self-pay | Admitting: Orthopedic Surgery

## 2015-03-30 DIAGNOSIS — M5441 Lumbago with sciatica, right side: Secondary | ICD-10-CM

## 2015-03-30 DIAGNOSIS — M5442 Lumbago with sciatica, left side: Principal | ICD-10-CM

## 2015-04-07 ENCOUNTER — Ambulatory Visit
Admission: RE | Admit: 2015-04-07 | Discharge: 2015-04-07 | Disposition: A | Discharge status: Home / Self Care | Payer: PPO | Source: Ambulatory Visit | Attending: Orthopedic Surgery | Admitting: Orthopedic Surgery

## 2015-04-07 DIAGNOSIS — Z9889 Other specified postprocedural states: Secondary | ICD-10-CM | POA: Diagnosis not present

## 2015-04-07 DIAGNOSIS — M5441 Lumbago with sciatica, right side: Secondary | ICD-10-CM

## 2015-04-07 DIAGNOSIS — M545 Low back pain: Secondary | ICD-10-CM | POA: Diagnosis present

## 2015-04-07 DIAGNOSIS — M5442 Lumbago with sciatica, left side: Secondary | ICD-10-CM

## 2015-04-07 DIAGNOSIS — M5186 Other intervertebral disc disorders, lumbar region: Secondary | ICD-10-CM | POA: Insufficient documentation

## 2015-04-07 DIAGNOSIS — M5187 Other intervertebral disc disorders, lumbosacral region: Secondary | ICD-10-CM | POA: Insufficient documentation

## 2015-04-07 DIAGNOSIS — G8929 Other chronic pain: Secondary | ICD-10-CM | POA: Diagnosis present

## 2015-09-12 ENCOUNTER — Other Ambulatory Visit: Payer: Self-pay | Admitting: Pain Medicine

## 2015-09-12 ENCOUNTER — Ambulatory Visit: Payer: PPO | Attending: Pain Medicine | Admitting: Pain Medicine

## 2015-09-12 ENCOUNTER — Encounter: Payer: Self-pay | Admitting: Pain Medicine

## 2015-09-12 VITALS — BP 139/76 | HR 78 | Temp 98.6°F | Resp 16 | Ht 71.0 in | Wt 290.0 lb

## 2015-09-12 DIAGNOSIS — Z79899 Other long term (current) drug therapy: Secondary | ICD-10-CM

## 2015-09-12 DIAGNOSIS — M9979 Connective tissue and disc stenosis of intervertebral foramina of abdomen and other regions: Secondary | ICD-10-CM | POA: Insufficient documentation

## 2015-09-12 DIAGNOSIS — M961 Postlaminectomy syndrome, not elsewhere classified: Secondary | ICD-10-CM

## 2015-09-12 DIAGNOSIS — T859XXS Unspecified complication of internal prosthetic device, implant and graft, sequela: Secondary | ICD-10-CM

## 2015-09-12 DIAGNOSIS — G8929 Other chronic pain: Secondary | ICD-10-CM | POA: Insufficient documentation

## 2015-09-12 DIAGNOSIS — Z5181 Encounter for therapeutic drug level monitoring: Secondary | ICD-10-CM

## 2015-09-12 DIAGNOSIS — E739 Lactose intolerance, unspecified: Secondary | ICD-10-CM | POA: Insufficient documentation

## 2015-09-12 DIAGNOSIS — M542 Cervicalgia: Secondary | ICD-10-CM

## 2015-09-12 DIAGNOSIS — I10 Essential (primary) hypertension: Secondary | ICD-10-CM | POA: Diagnosis not present

## 2015-09-12 DIAGNOSIS — E669 Obesity, unspecified: Secondary | ICD-10-CM | POA: Diagnosis not present

## 2015-09-12 DIAGNOSIS — K759 Inflammatory liver disease, unspecified: Secondary | ICD-10-CM | POA: Insufficient documentation

## 2015-09-12 DIAGNOSIS — M533 Sacrococcygeal disorders, not elsewhere classified: Secondary | ICD-10-CM | POA: Insufficient documentation

## 2015-09-12 DIAGNOSIS — I4891 Unspecified atrial fibrillation: Secondary | ICD-10-CM | POA: Diagnosis not present

## 2015-09-12 DIAGNOSIS — M4726 Other spondylosis with radiculopathy, lumbar region: Secondary | ICD-10-CM

## 2015-09-12 DIAGNOSIS — M25562 Pain in left knee: Secondary | ICD-10-CM | POA: Diagnosis not present

## 2015-09-12 DIAGNOSIS — M159 Polyosteoarthritis, unspecified: Secondary | ICD-10-CM

## 2015-09-12 DIAGNOSIS — M150 Primary generalized (osteo)arthritis: Secondary | ICD-10-CM

## 2015-09-12 DIAGNOSIS — M47812 Spondylosis without myelopathy or radiculopathy, cervical region: Secondary | ICD-10-CM

## 2015-09-12 DIAGNOSIS — K219 Gastro-esophageal reflux disease without esophagitis: Secondary | ICD-10-CM | POA: Diagnosis not present

## 2015-09-12 DIAGNOSIS — K259 Gastric ulcer, unspecified as acute or chronic, without hemorrhage or perforation: Secondary | ICD-10-CM | POA: Insufficient documentation

## 2015-09-12 DIAGNOSIS — Z79891 Long term (current) use of opiate analgesic: Secondary | ICD-10-CM

## 2015-09-12 DIAGNOSIS — M5412 Radiculopathy, cervical region: Secondary | ICD-10-CM

## 2015-09-12 DIAGNOSIS — M545 Low back pain, unspecified: Secondary | ICD-10-CM

## 2015-09-12 DIAGNOSIS — M25559 Pain in unspecified hip: Secondary | ICD-10-CM | POA: Insufficient documentation

## 2015-09-12 DIAGNOSIS — Z87891 Personal history of nicotine dependence: Secondary | ICD-10-CM | POA: Insufficient documentation

## 2015-09-12 DIAGNOSIS — F1190 Opioid use, unspecified, uncomplicated: Secondary | ICD-10-CM

## 2015-09-12 DIAGNOSIS — Z9889 Other specified postprocedural states: Secondary | ICD-10-CM | POA: Insufficient documentation

## 2015-09-12 DIAGNOSIS — M539 Dorsopathy, unspecified: Secondary | ICD-10-CM

## 2015-09-12 DIAGNOSIS — M1990 Unspecified osteoarthritis, unspecified site: Secondary | ICD-10-CM | POA: Insufficient documentation

## 2015-09-12 DIAGNOSIS — J45909 Unspecified asthma, uncomplicated: Secondary | ICD-10-CM | POA: Insufficient documentation

## 2015-09-12 DIAGNOSIS — G96198 Other disorders of meninges, not elsewhere classified: Secondary | ICD-10-CM

## 2015-09-12 DIAGNOSIS — M47816 Spondylosis without myelopathy or radiculopathy, lumbar region: Secondary | ICD-10-CM

## 2015-09-12 DIAGNOSIS — M792 Neuralgia and neuritis, unspecified: Secondary | ICD-10-CM

## 2015-09-12 DIAGNOSIS — R52 Pain, unspecified: Secondary | ICD-10-CM | POA: Diagnosis present

## 2015-09-12 DIAGNOSIS — M79601 Pain in right arm: Secondary | ICD-10-CM

## 2015-09-12 DIAGNOSIS — M8580 Other specified disorders of bone density and structure, unspecified site: Secondary | ICD-10-CM | POA: Insufficient documentation

## 2015-09-12 DIAGNOSIS — M25561 Pain in right knee: Secondary | ICD-10-CM

## 2015-09-12 DIAGNOSIS — G9619 Other disorders of meninges, not elsewhere classified: Secondary | ICD-10-CM

## 2015-09-12 DIAGNOSIS — M7918 Myalgia, other site: Secondary | ICD-10-CM

## 2015-09-12 DIAGNOSIS — G4730 Sleep apnea, unspecified: Secondary | ICD-10-CM | POA: Insufficient documentation

## 2015-09-12 DIAGNOSIS — M791 Myalgia: Secondary | ICD-10-CM

## 2015-09-12 DIAGNOSIS — G5641 Causalgia of right upper limb: Secondary | ICD-10-CM

## 2015-09-12 DIAGNOSIS — K649 Unspecified hemorrhoids: Secondary | ICD-10-CM | POA: Insufficient documentation

## 2015-09-12 DIAGNOSIS — F1120 Opioid dependence, uncomplicated: Secondary | ICD-10-CM

## 2015-09-12 DIAGNOSIS — I471 Supraventricular tachycardia: Secondary | ICD-10-CM | POA: Insufficient documentation

## 2015-09-12 DIAGNOSIS — M5416 Radiculopathy, lumbar region: Secondary | ICD-10-CM

## 2015-09-12 MED ORDER — FENTANYL 12 MCG/HR TD PT72: 12.5000 ug | MEDICATED_PATCH | TRANSDERMAL | Status: DC

## 2015-09-12 MED ORDER — OXYCODONE HCL 20 MG PO TABS: 20.0000 mg | ORAL_TABLET | Freq: Three times a day (TID) | ORAL | Status: DC

## 2015-09-12 MED ORDER — OXYCODONE HCL 20 MG PO TABS: 20.0000 mg | ORAL_TABLET | Freq: Three times a day (TID) | ORAL | Status: DC | PRN

## 2015-09-12 NOTE — Progress Notes (Signed)
Patient's Name: James [name] MRN: [id number] DOB: [birth year] DOS: 09/12/2015  Primary Reason(s) for Visit: Encounter for Medication Management. CC: Pain   HPI:   James [name] is a 59 y.o. year old, male patient, who returns today as an established patient. He has Airway hyperreactivity; Atrial fibrillation ([place]); Narrowing of intervertebral disc space; Type 2 diabetes mellitus ([place]); Abnormal echocardiogram; Gastric ulcer; Gastric catarrh; Acid reflux; Awareness of heartbeats; Hemorrhoid; Hepatitis; BP (high blood pressure); Lactose intolerance; Osteopenia; APC (atrial premature contractions); Apnea, sleep; Supraventricular tachycardia ([place]); Chronic pain; Long term current use of opiate analgesic; Long term prescription opiate use; Opiate use; Opiate dependence ([place]); Encounter for therapeutic drug level monitoring; Chronic low back pain (right side); Lumbar spondylosis; Chronic neck pain; Cervical spondylosis (C7 intravertebral body cyst); Chronic cervical radicular pain; Chronic lumbar radicular pain; Osteoarthritis; Chronic hip pain; Chronic bilateral knee pain; Complex regional pain syndrome type 2 of right upper extremity; Chronic pain of right upper extremity; 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 His primarily concern today is the Pain    The patient returns today indicating that he is having a lot of medical problems which are really adversely contributing to his pain. He is the one I change his medication dose, but he does admit that he is constantly having pain. Today's Pain Score: 8  Reported level of pain is incompatible with clinical obrservations. This may be  secondary to a possible lack of understanding on how the pain scale works. Pain Type: Chronic pain Pain Location: Back Pain Orientation: Lower Pain Descriptors / Indicators: Aching, Sharp, Shooting Pain Frequency: Constant  Date of Last Visit:   06/23/2015 Service Provided on Last Visit:   Medication management visit  Pharmacotherapy Review:   Side-effects or Adverse reactions: None reported. Effectiveness: Described as relatively effective, allowing for increase in activities of daily living (ADL). Onset of action: Within expected pharmacological parameters. Duration of action: Within normal limits for medication. Peak effect: Timing and results are as within normal expected parameters. [place] PMP: Compliant with practice rules and regulations. DST: Compliant with practice rules and regulations. Lab work: No new labs ordered by our practice. Treatment compliance: Compliant. Substance Use Disorder (SUD) Risk Level: Low Planned course of action: Continue therapy as is.  Allergies: James [name] is allergic to talwin; codeine; kenalog; and penicillins.  Meds: The patient has a current medication list which includes the following prescription(s): albuterol, vitamin c, vitamin d-3, cyanocobalamin, fentanyl, folic acid, levothyroxine, loratadine, methotrexate, metoprolol, multivitamin with minerals, oxycodone hcl, oxymetazoline, pantoprazole, sucralfate, temazepam, testosterone cypionate, fentanyl, and oxycodone hcl. Requested Prescriptions   Signed Prescriptions Disp Refills  . Oxycodone HCl 20 MG TABS 90 tablet 0    Sig: Take 1 tablet (20 mg total) by mouth every 8 (eight) hours.  . fentaNYL (DURAGESIC - DOSED MCG/HR) 12 MCG/HR 10 patch 0    Sig: Place 1 patch (12.5 mcg total) onto the skin every 3 (three) days.  . fentaNYL (DURAGESIC - DOSED MCG/HR) 12 MCG/HR 10 patch 0    Sig: Place 1 patch (12.5 mcg total) onto the skin every 3 (three) days.  . Oxycodone HCl 20 MG TABS 90 tablet 0     Sig: Take 1 tablet (20 mg total) by mouth every 8 (eight) hours as needed.  ROS: Constitutional: Afebrile, no chills, well hydrated and well nourished Gastrointestinal: negative Musculoskeletal:negative Neurological: negative Behavioral/Psych: negative  PFSH: Medical:  James [name]  has a past medical history of Allergy; Anemia; Arthritis; Asthma; Blood transfusion without reported diagnosis; Cancer ([place]); Cataract; GERD (gastroesophageal reflux disease); Heart murmur; Sleep apnea; Hypertension; Osteoporosis; Thyroid disease; Hypothyroidism; Supraventricular tachycardia ([place]); Obesity; Lupus ([place]); Hiatal hernia; Peripheral nerve disease ([place]); Hepatitis C; Bursitis; Tendonitis; Gastritis; and 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 Family: family history includes Arthritis in his father and mother; COPD in his father; Cancer in his father and mother; Diabetes in his mother; Hypertension in his father and mother; Stroke in his mother. Surgical:  has past surgical history that includes Spine surgery; Eye surgery; Fracture surgery (Right); Fracture surgery (Bilateral); Fracture surgery (Bilateral); Joint replacement (Right); Spinal cord stimulator implant; Spinal cord stimulator removal; Patella arthroplasty; and Shoulder arthroscopy (Bilateral). Tobacco:  reports that he has quit smoking. His smoking use included Cigarettes. He does not have any smokeless tobacco history on file. Alcohol:  reports that he does not drink alcohol. Drug:  reports that he does not use illicit drugs.  Physical Exam: Vitals:  Today's Vitals   09/12/15 1423  BP: 139/76  Pulse: 78  Temp: 98.6 F (37 C)  TempSrc: Oral  Resp: 16  Height: 5\' 11"  (1.803 m)  Weight: 290 lb (131.543 kg)  SpO2: 100%  PainSc: 8   PainLoc: Back  Calculated BMI: Body mass index is 40.46 kg/(m^2). General appearance: alert, cooperative, appears older than stated age, mild distress and  moderately obese Eyes: conjunctivae/corneas clear. PERRL, EOM's intact. Fundi benign. Lungs: No evidence respiratory distress, no audible rales or ronchi and no use of accessory muscles of respiration Neck: no adenopathy, no carotid bruit, no JVD, supple, symmetrical, trachea midline and thyroid not enlarged, symmetric, no tenderness/mass/nodules Back: symmetric, no curvature. ROM normal. No CVA tenderness. Extremities: extremities normal, atraumatic, no cyanosis or edema Pulses: 2+ and symmetric Skin: Skin color, texture, turgor normal. No rashes or lesions Neurologic: Grossly normal    Assessment: Encounter Diagnosis:  Primary Diagnosis: Chronic pain [G89.29]  Plan: James [name] was seen today for pain.  Diagnoses and all orders for this visit:  Chronic pain -     COMPLETE METABOLIC PANEL WITH GFR; Future -     C-reactive protein; Future -     Magnesium; Future -     Vitamin D2,D3 Panel; Future -     Sedimentation rate; Future -     Oxycodone HCl 20 MG TABS; Take 1 tablet (20 mg total) by mouth every 8 (eight) hours. -     fentaNYL (DURAGESIC - DOSED MCG/HR) 12 MCG/HR; Place 1 patch (12.5 mcg total) onto the skin every 3 (three) days.  Long term current use of opiate analgesic -     Drugs of abuse screen w/o alc, rtn urine-sln; Future  Long term prescription opiate use  Opiate use  Uncomplicated opioid dependence ([place])  Encounter for therapeutic drug level monitoring  Chronic low back pain  Osteoarthritis of spine with radiculopathy, lumbar region  Chronic neck pain  Cervical spondylosis  Chronic cervical radicular pain  Chronic lumbar radicular pain  Primary osteoarthritis involving multiple joints  Chronic hip pain, unspecified laterality  Chronic bilateral knee pain  Complex regional pain syndrome type 2 of right upper extremity  Chronic pain of right upper extremity  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 -     Oxycodone HCl 20 MG TABS; Take 1 tablet (20 mg total) by mouth every 8 (eight) hours as needed.     There are no Patient Instructions on file for this visit. Medications discontinued today:  Medications Discontinued During This Encounter  Medication Reason  . metoprolol (LOPRESSOR) 100 MG tablet Dose change  . Oxycodone HCl 20 MG TABS Reorder  . fentaNYL (DURAGESIC - DOSED MCG/HR) 12 MCG/HR Reorder   Medications administered today:  Mr. Falletta had no medications administered during this visit.  Primary Care Physician: [name], MD Location: [facility] Note by: Kathlen Brunswick. Dossie Arbour, M.D, DABA, DABAPM, DABPM, DABIPP, FIPP

## 2015-09-12 NOTE — Progress Notes (Signed)
The patient states that the past 2 mondays October 24 and 31, 2016; went to the dermatologist to have lymph nodes removed under both armpits. Area was injected with Kenalog and an antibiotic.

## 2015-09-12 NOTE — Progress Notes (Signed)
Pill Count: Fentanyl patch: # 5 Oxycodone:  # 49

## 2015-09-13 ENCOUNTER — Encounter: Payer: Self-pay | Admitting: Pain Medicine

## 2015-09-13 DIAGNOSIS — M47816 Spondylosis without myelopathy or radiculopathy, lumbar region: Secondary | ICD-10-CM | POA: Insufficient documentation

## 2015-09-13 DIAGNOSIS — M961 Postlaminectomy syndrome, not elsewhere classified: Secondary | ICD-10-CM | POA: Insufficient documentation

## 2015-09-13 DIAGNOSIS — G9619 Other disorders of meninges, not elsewhere classified: Secondary | ICD-10-CM

## 2015-09-13 DIAGNOSIS — G5641 Causalgia of right upper limb: Secondary | ICD-10-CM | POA: Insufficient documentation

## 2015-09-13 DIAGNOSIS — T859XXA Unspecified complication of internal prosthetic device, implant and graft, initial encounter: Secondary | ICD-10-CM | POA: Insufficient documentation

## 2015-09-13 DIAGNOSIS — M792 Neuralgia and neuritis, unspecified: Secondary | ICD-10-CM | POA: Insufficient documentation

## 2015-09-13 DIAGNOSIS — M25562 Pain in left knee: Secondary | ICD-10-CM

## 2015-09-13 DIAGNOSIS — M79601 Pain in right arm: Secondary | ICD-10-CM

## 2015-09-13 DIAGNOSIS — M7918 Myalgia, other site: Secondary | ICD-10-CM | POA: Insufficient documentation

## 2015-09-13 DIAGNOSIS — G8929 Other chronic pain: Secondary | ICD-10-CM | POA: Insufficient documentation

## 2015-09-13 DIAGNOSIS — G96198 Other disorders of meninges, not elsewhere classified: Secondary | ICD-10-CM | POA: Insufficient documentation

## 2015-09-13 DIAGNOSIS — M533 Sacrococcygeal disorders, not elsewhere classified: Secondary | ICD-10-CM

## 2015-09-13 HISTORY — DX: Unspecified complication of internal prosthetic device, implant and graft, initial encounter: T85.9XXA

## 2015-09-20 LAB — TOXASSURE SELECT 13 (MW), URINE: PDF: 0

## 2015-10-11 ENCOUNTER — Other Ambulatory Visit: Payer: Self-pay | Admitting: Pain Medicine

## 2015-10-17 DIAGNOSIS — E039 Hypothyroidism, unspecified: Secondary | ICD-10-CM | POA: Insufficient documentation

## 2015-10-24 DIAGNOSIS — R079 Chest pain, unspecified: Secondary | ICD-10-CM | POA: Insufficient documentation

## 2015-11-08 DIAGNOSIS — I10 Essential (primary) hypertension: Secondary | ICD-10-CM | POA: Diagnosis not present

## 2015-11-08 DIAGNOSIS — R931 Abnormal findings on diagnostic imaging of heart and coronary circulation: Secondary | ICD-10-CM | POA: Diagnosis not present

## 2015-11-08 DIAGNOSIS — R002 Palpitations: Secondary | ICD-10-CM | POA: Diagnosis not present

## 2015-11-08 DIAGNOSIS — I491 Atrial premature depolarization: Secondary | ICD-10-CM | POA: Diagnosis not present

## 2015-11-08 DIAGNOSIS — R0602 Shortness of breath: Secondary | ICD-10-CM | POA: Diagnosis not present

## 2015-11-08 DIAGNOSIS — I471 Supraventricular tachycardia: Secondary | ICD-10-CM | POA: Diagnosis not present

## 2015-11-08 DIAGNOSIS — I480 Paroxysmal atrial fibrillation: Secondary | ICD-10-CM | POA: Diagnosis not present

## 2015-11-08 DIAGNOSIS — R079 Chest pain, unspecified: Secondary | ICD-10-CM | POA: Diagnosis not present

## 2015-11-08 DIAGNOSIS — J45909 Unspecified asthma, uncomplicated: Secondary | ICD-10-CM | POA: Diagnosis not present

## 2015-11-11 DIAGNOSIS — I493 Ventricular premature depolarization: Secondary | ICD-10-CM | POA: Diagnosis not present

## 2015-11-28 DIAGNOSIS — R1013 Epigastric pain: Secondary | ICD-10-CM | POA: Diagnosis not present

## 2015-11-28 DIAGNOSIS — Z1211 Encounter for screening for malignant neoplasm of colon: Secondary | ICD-10-CM | POA: Diagnosis not present

## 2015-12-01 DIAGNOSIS — M1990 Unspecified osteoarthritis, unspecified site: Secondary | ICD-10-CM | POA: Diagnosis not present

## 2015-12-08 DIAGNOSIS — L409 Psoriasis, unspecified: Secondary | ICD-10-CM | POA: Diagnosis not present

## 2015-12-08 DIAGNOSIS — M1990 Unspecified osteoarthritis, unspecified site: Secondary | ICD-10-CM | POA: Diagnosis not present

## 2015-12-08 DIAGNOSIS — M150 Primary generalized (osteo)arthritis: Secondary | ICD-10-CM | POA: Diagnosis not present

## 2015-12-08 DIAGNOSIS — L4050 Arthropathic psoriasis, unspecified: Secondary | ICD-10-CM | POA: Insufficient documentation

## 2015-12-12 ENCOUNTER — Other Ambulatory Visit: Payer: Self-pay | Admitting: Pain Medicine

## 2015-12-12 ENCOUNTER — Ambulatory Visit: Payer: PPO | Attending: Pain Medicine | Admitting: Pain Medicine

## 2015-12-12 ENCOUNTER — Encounter: Payer: Self-pay | Admitting: Pain Medicine

## 2015-12-12 VITALS — BP 126/76 | HR 70 | Temp 98.5°F | Resp 16 | Ht 72.0 in | Wt 272.0 lb

## 2015-12-12 DIAGNOSIS — M549 Dorsalgia, unspecified: Secondary | ICD-10-CM | POA: Diagnosis not present

## 2015-12-12 DIAGNOSIS — G4730 Sleep apnea, unspecified: Secondary | ICD-10-CM | POA: Insufficient documentation

## 2015-12-12 DIAGNOSIS — Z79891 Long term (current) use of opiate analgesic: Secondary | ICD-10-CM

## 2015-12-12 DIAGNOSIS — M545 Low back pain, unspecified: Secondary | ICD-10-CM

## 2015-12-12 DIAGNOSIS — Z5181 Encounter for therapeutic drug level monitoring: Secondary | ICD-10-CM | POA: Diagnosis not present

## 2015-12-12 DIAGNOSIS — M5412 Radiculopathy, cervical region: Secondary | ICD-10-CM

## 2015-12-12 DIAGNOSIS — M25561 Pain in right knee: Secondary | ICD-10-CM | POA: Diagnosis not present

## 2015-12-12 DIAGNOSIS — M1990 Unspecified osteoarthritis, unspecified site: Secondary | ICD-10-CM | POA: Insufficient documentation

## 2015-12-12 DIAGNOSIS — I10 Essential (primary) hypertension: Secondary | ICD-10-CM | POA: Diagnosis not present

## 2015-12-12 DIAGNOSIS — G8929 Other chronic pain: Secondary | ICD-10-CM | POA: Diagnosis not present

## 2015-12-12 DIAGNOSIS — I4891 Unspecified atrial fibrillation: Secondary | ICD-10-CM | POA: Diagnosis not present

## 2015-12-12 DIAGNOSIS — K219 Gastro-esophageal reflux disease without esophagitis: Secondary | ICD-10-CM | POA: Diagnosis not present

## 2015-12-12 DIAGNOSIS — E119 Type 2 diabetes mellitus without complications: Secondary | ICD-10-CM | POA: Insufficient documentation

## 2015-12-12 DIAGNOSIS — M25562 Pain in left knee: Secondary | ICD-10-CM | POA: Diagnosis not present

## 2015-12-12 DIAGNOSIS — Z79899 Other long term (current) drug therapy: Secondary | ICD-10-CM | POA: Diagnosis not present

## 2015-12-12 DIAGNOSIS — M792 Neuralgia and neuritis, unspecified: Secondary | ICD-10-CM

## 2015-12-12 DIAGNOSIS — F1190 Opioid use, unspecified, uncomplicated: Secondary | ICD-10-CM | POA: Insufficient documentation

## 2015-12-12 DIAGNOSIS — M79606 Pain in leg, unspecified: Secondary | ICD-10-CM

## 2015-12-12 DIAGNOSIS — M150 Primary generalized (osteo)arthritis: Secondary | ICD-10-CM

## 2015-12-12 DIAGNOSIS — M159 Polyosteoarthritis, unspecified: Secondary | ICD-10-CM

## 2015-12-12 LAB — MAGNESIUM: MAGNESIUM: 2.1 mg/dL (ref 1.7–2.4)

## 2015-12-12 LAB — C-REACTIVE PROTEIN: CRP: <0.5 mg/dL (ref <1.0)

## 2015-12-12 LAB — COMPREHENSIVE METABOLIC PANEL
ALBUMIN: 4.5 g/dL (ref 3.5–5.0)
ALK PHOS: 47 U/L (ref 38–126)
ALT: 31 U/L (ref 17–63)
ANION GAP: 6 (ref 5–15)
AST: 30 U/L (ref 15–41)
BUN: 13 mg/dL (ref 6–20)
CALCIUM: 9.7 mg/dL (ref 8.9–10.3)
CO2: 29 mmol/L (ref 22–32)
Chloride: 103 mmol/L (ref 101–111)
Creatinine, Ser: 1.19 mg/dL (ref 0.61–1.24)
GFR calc non Af Amer: >60 mL/min (ref >60)
GLUCOSE: 102 mg/dL — AB (ref 65–99)
POTASSIUM: 4.4 mmol/L (ref 3.5–5.1)
SODIUM: 138 mmol/L (ref 135–145)
TOTAL PROTEIN: 7.7 g/dL (ref 6.5–8.1)
Total Bilirubin: 1.1 mg/dL (ref 0.3–1.2)

## 2015-12-12 LAB — SEDIMENTATION RATE: SED RATE: 4 mm/h (ref 0–20)

## 2015-12-12 MED ORDER — FENTANYL 12 MCG/HR TD PT72: 12.5000 ug | MEDICATED_PATCH | TRANSDERMAL | Status: DC

## 2015-12-12 MED ORDER — OXYCODONE HCL 20 MG PO TABS: 20.0000 mg | ORAL_TABLET | Freq: Three times a day (TID) | ORAL | Status: DC | PRN

## 2015-12-12 MED ORDER — OXYCODONE HCL 20 MG PO TABS: 20.0000 mg | ORAL_TABLET | Freq: Three times a day (TID) | ORAL | Status: DC

## 2015-12-12 NOTE — Patient Instructions (Signed)
Instructed to get labwork drawn at pre admit testing today.

## 2015-12-12 NOTE — Progress Notes (Signed)
In December 2016, had bronchitis and treated with antibiotics. Also received antibiotics for a skin infection (chest area). Had a couple axillary lymph nodes removed due to infection. Pill count: Fentanyl patch # 5 Oxycodone 20 mg # 48 1/2

## 2015-12-12 NOTE — Progress Notes (Signed)
Patient's Name: James [name] MRN: [id number] DOB: 1956/07/20 DOS: 12/12/2015  Primary Reason(s) for Visit: Encounter for Medication Management CC: Joint Pain and Back Pain   HPI  Mr. Shehata is a 60 y.o. year old, male patient, who returns today as an established patient. He has Airway hyperreactivity; Atrial fibrillation ([place]); Narrowing of intervertebral disc space; Type 2 diabetes mellitus ([place]); Abnormal echocardiogram; Gastric ulcer; Gastric catarrh; Acid reflux; Awareness of heartbeats; Hemorrhoid; Hepatitis; BP (high blood pressure); Lactose intolerance; Osteopenia; APC (atrial premature contractions); Apnea, sleep; Supraventricular tachycardia ([place]); Chronic pain; Long term current use of opiate analgesic; Long term prescription opiate use; Opiate use; Opiate dependence ([place]); Encounter for therapeutic drug level monitoring; Chronic low back pain (Location of Primary Source of Pain) (Bilateral) (R>L); Lumbar spondylosis; Chronic neck pain (Right); Cervical spondylosis (C7 intravertebral body cyst); Chronic cervical radicular pain (Right); Chronic lumbar radicular pain (Location of Secondary source of pain) (Bilateral) (R>L) (L5 Dermatome); Osteoarthritis; Chronic hip pain; Chronic knee pain (Bilateral) (R>L); Complex regional pain syndrome type II of upper extremity (Right); Chronic upper extremity pain (Right); 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 His primarily concern today is the Joint Pain and Back Pain   The patient comes in today clinics today for pharmacological management of his chronic pain. The patient indicates that his primary pain is in the lower back with the right side being worst on the left. His secondary pain is the lower extremities with the right also being worst on the left. In both instances the pain goes down to the top of the foot in what seems to be the L5 dermatomal distribution. On the left side he has pain on the top of the foot and the heel with some dysesthesias. On the right leg the pain is primarily in the area of the knee and then goes down to the top of the foot and into the big toe (L5 dermatome). His third worst pain is his neck where it hurts primarily in the right side. His fourth worst pain is the right shoulder and going into fingers #45 following an ulnar distribution and or C8 dermatomal distribution. Having said this, the patient does have a history of an ulnar as well as a radial nerve transposition both of which were done around 1999 and 2000.  Reported Pain Score: 6 , clinically he looks like a 2-3/10. Reported level is inconsistent with clinical obrservations. Pain Type: Chronic pain Pain Location: Back Pain Orientation: Upper, Mid, Lower Pain Descriptors / Indicators: Aching Pain Frequency: Constant  Date of Last Visit: 09/12/15 Service Provided on Last Visit: Med Refill  Pharmacotherapy  Medication(s): Duragesic 12.5 mcg/h every 72 hours plus oxycodone 20 mg 1 tablet by mouth every 8 hours when necessary for pain Onset of action: Within expected pharmacological parameters Time to Peak effect: Timing and results are as within normal expected parameters Analgesic Effect: More than 50% Activity Facilitation: Medication(s) allow patient to sit, stand, walk, and do the basic ADLs Perceived Effectiveness: Described as relatively effective,  allowing for increase in activities of daily living (ADL)  Side-effects or Adverse reactions: None reported Duration of action: Within normal limits for medication [place] PMP: Compliant with practice rules and regulations UDS Results: Last UDS done on 09/12/2015 came back within normal limits with no unexpected findings. UDS Interpretation: Patient appears to be compliant with practice rules and regulations Medication Assessment Form: Reviewed. Patient indicates being compliant with therapy Treatment compliance: Compliant Substance Use Disorder (SUD) Risk Level: Low Pharmacologic Plan: Continue therapy as is  Lab Work: Illicit Drugs No results found for: THCU, COCAINSCRNUR, PCPSCRNUR, MDMA, AMPHETMU, METHADONE, ETOH  Inflammation Markers Lab Results  Component Value Date   ESRSEDRATE 4 12/12/2015   CRP <0.5 12/12/2015    Renal Function Lab Results  Component Value Date   BUN 13 12/12/2015   CREATININE 1.19 12/12/2015   GFRAA >60 12/12/2015   GFRNONAA >60 12/12/2015    Hepatic Function Lab Results  Component Value Date   AST 30 12/12/2015   ALT 31 12/12/2015   ALBUMIN 4.5 12/12/2015    Electrolytes Lab Results  Component Value Date   NA 138 12/12/2015   K 4.4 12/12/2015   CL 103 12/12/2015   CALCIUM 9.7 12/12/2015   MG 2.1 12/12/2015    Allergies  Mr. Pippenger is allergic to lactose; talwin; codeine; kenalog; and penicillins.  Meds  The patient has a current medication list which includes the following prescription(s): albuterol, vitamin c, vitamin d-3, cyanocobalamin, fentanyl, fentanyl, folic acid, ipratropium-albuterol, levothyroxine, loratadine, methotrexate, metoprolol tartrate, multivitamin with minerals, oxycodone hcl, oxycodone hcl, oxymetazoline, pantoprazole, prednisone, sucralfate, temazepam, testosterone cypionate, fentanyl, and oxycodone hcl.  Current Outpatient Prescriptions on File Prior to Visit  Medication Sig  . albuterol (PROAIR HFA) 108 (90 BASE)  MCG/ACT inhaler Inhale 2 puffs into the lungs every 4 (four) hours as needed for wheezing or shortness of breath.   . Ascorbic Acid (VITAMIN C) 1000 MG tablet Take 1,000 mg by mouth daily.  . Cholecalciferol (VITAMIN D-3) 1000 UNITS CAPS Take 2,000 Units by mouth daily.   . cyanocobalamin 500 MCG tablet Take 500 mcg by mouth daily.  . folic acid (FOLVITE) 1 MG tablet Take 1 mg by mouth 2 (two) times daily.   Marland Kitchen levothyroxine (SYNTHROID, LEVOTHROID) 25 MCG tablet TAKE 1 TABLET EVERY MORNING ON AN EMPTY STOMACH WITH A GLASS OF WATERAT LEAST 30 TO 60 MINUTES BEFORE BREAKFAST  . Loratadine 10 MG CAPS Take 10 mg by mouth daily.   . methotrexate 50 MG/2ML injection Inject 8 mg as directed once a week.   . Multiple Vitamins-Minerals (MULTIVITAMIN WITH MINERALS) tablet 1 tablet daily.   Marland Kitchen oxymetazoline (NASAL DECONGESTANT SPRAY) 0.05 % nasal spray Place 1 spray into both nostrils daily as needed.   . pantoprazole (PROTONIX) 40 MG tablet Take 40 mg by mouth 2 (two) times daily.   . sucralfate (CARAFATE) 1 G tablet Take 1 g by mouth 4 (four) times daily.   . temazepam (RESTORIL) 15 MG capsule Take 15 mg by mouth at bedtime as needed for sleep.  Marland Kitchen testosterone cypionate (DEPOTESTOSTERONE CYPIONATE) 200 MG/ML injection INJECT 1 ML INTRAMUSCULARY EVERY 2 WEEKS   No current facility-administered medications on file prior to visit.    ROS  Constitutional: Afebrile, no chills, well hydrated and well nourished Gastrointestinal: negative Musculoskeletal:negative Neurological: negative Behavioral/Psych: negative  [place]  Medical:  Mr. Colaluca  has a past medical history of Allergy; Anemia; Arthritis; Asthma; Blood transfusion without reported diagnosis; Cancer ([place]); Cataract; GERD (gastroesophageal reflux disease); Heart murmur; Sleep apnea; Hypertension; Osteoporosis; Thyroid disease; Hypothyroidism; Supraventricular tachycardia (  [place]); Obesity; Lupus ([place]); Hiatal hernia; Peripheral nerve disease ([place]);  Hepatitis C; Bursitis; Tendonitis; Gastritis; 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[place]) (09/13/2015); Psoriatic arthritis ([place]); and Bronchitis. Family: family history includes Arthritis in his father and mother; COPD in his father; Cancer in his father and mother; Diabetes in his mother; Hypertension in his father and mother; Stroke in his mother. Surgical:  has past surgical history that includes Spine surgery; Eye surgery; Fracture surgery (Right); Fracture surgery (Bilateral); Fracture surgery (Bilateral); Joint replacement (Right); Spinal cord stimulator implant; Spinal cord stimulator removal; Patella arthroplasty; and Shoulder arthroscopy (Bilateral). Tobacco:  reports that he has quit smoking. His smoking use included Cigarettes. He does not have any smokeless tobacco history on file. Alcohol:  reports that he does not drink alcohol. Drug:  reports that he does not use illicit drugs.  Physical Exam  Vitals:  Today's Vitals   12/12/15 1048 12/12/15 1051  BP: 126/76   Pulse: 70   Temp: 98.5 F (36.9 C)   TempSrc: Oral   Resp: 16   Height: 6' (1.829 m)   Weight: 272 lb (123.378 kg)   SpO2: 99%   PainSc: 6  6   PainLoc: Back     Calculated BMI: Body mass index is 36.88 kg/(m^2).  General appearance: alert, cooperative, appears stated age, mild distress and moderately obese Eyes: PERLA Respiratory: No evidence respiratory distress, no audible rales or ronchi and no use of accessory muscles of respiration  Cervical Spine Inspection: Normal anatomy Alignment: Symetrical ROM: Decreased  Upper Extremities Inspection: No gross anomalies detected ROM: Decreased right shoulder Sensory: Normal Motor: Unremarkable   Thoracic Spine Inspection: No gross anomalies detected Alignment: Symetrical ROM: Adequate Palpation: WNL  Lumbar Spine Inspection: No gross anomalies detected Alignment: Symetrical ROM: Decreased Palpation: Tender Provocative  Tests:  Lumbar Hyperextension and rotation test:  Positive bilaterally Patrick's Maneuver: Positive bilaterally Gait: WNL  Lower Extremities Inspection: No gross anomalies detected ROM: Adequate Sensory:  Normal Motor: Grossly intact  Assessment & Plan  Primary Diagnosis & Pertinent Problem List: The primary encounter diagnosis was Chronic pain. Diagnoses of Encounter for therapeutic drug level monitoring, Long term current use of opiate analgesic, Chronic low back pain (right side), Chronic cervical radicular pain, Neuropathic pain, Primary osteoarthritis involving multiple joints, and Chronic pain of lower extremity, unspecified laterality were also pertinent to this visit.  Visit Diagnosis: 1. Chronic pain   2. Encounter for therapeutic drug level monitoring   3. Long term current use of opiate analgesic   4. Chronic low back pain (right side)   5. Chronic cervical radicular pain   6. Neuropathic pain   7. Primary osteoarthritis involving multiple joints   8. Chronic pain of lower extremity, unspecified laterality     Assessment: No problem-specific assessment & plan notes found for this encounter.   Plan of Care  Pharmacotherapy (Medications Ordered): Meds ordered this encounter  Medications  . fentaNYL (DURAGESIC - DOSED MCG/HR) 12 MCG/HR    Sig: Place 1 patch (12.5 mcg total) onto the skin every 3 (three) days.    Dispense:  10 patch    Refill:  0    Do not place this medication, or any other prescription from our practice, on "Automatic Refill". Patient may have prescription filled one day early if pharmacy is closed on scheduled refill date. Do not fill until: 02/24/16 To last until: 03/25/16  . fentaNYL (DURAGESIC - DOSED MCG/HR) 12 MCG/HR    Sig: Place 1 patch (12.5 mcg  total) onto the skin every 3 (three) days.    Dispense:  10 patch    Refill:  0    Do not place this medication, or any other prescription from our practice, on "Automatic Refill". Patient may  have prescription filled one day early if pharmacy is closed on scheduled refill date. Do not fill until: 12/26/15 To last until: 01/25/16  . Oxycodone HCl 20 MG TABS    Sig: Take 1 tablet (20 mg total) by mouth every 8 (eight) hours as needed.    Dispense:  90 tablet    Refill:  0    Do not place this medication, or any other prescription from our practice, on "Automatic Refill". Patient may have prescription filled one day early if pharmacy is closed on scheduled refill date. Do not fill until: 02/24/16 To last until: 03/25/16  . Oxycodone HCl 20 MG TABS    Sig: Take 1 tablet (20 mg total) by mouth every 8 (eight) hours.    Dispense:  90 tablet    Refill:  0    Do not place this medication, or any other prescription from our practice, on "Automatic Refill". Patient may have prescription filled one day early if pharmacy is closed on scheduled refill date. Do not fill until: 12/26/15 To last until: 01/25/16  . fentaNYL (DURAGESIC - DOSED MCG/HR) 12 MCG/HR    Sig: Place 1 patch (12.5 mcg total) onto the skin every 3 (three) days.    Dispense:  10 patch    Refill:  0    Do not place this medication, or any other prescription from our practice, on "Automatic Refill". Patient may have prescription filled one day early if pharmacy is closed on scheduled refill date. Do not fill until: 01/25/16 To last until: 02/24/16  . Oxycodone HCl 20 MG TABS    Sig: Take 1 tablet (20 mg total) by mouth every 8 (eight) hours as needed.    Dispense:  90 tablet    Refill:  0    Do not place this medication, or any other prescription from our practice, on "Automatic Refill". Patient may have prescription filled one day early if pharmacy is closed on scheduled refill date. Do not fill until: 01/25/16 To last until: 02/24/16    [facility] & Procedure Ordered: Orders Placed This Encounter  Procedures  . Drugs of abuse screen w/o alc, rtn urine-sln    Volume: 10 ml(s). Minimum 3 ml of urine is  needed. Document temperature of fresh sample. Indications: Long term (current) use of opiate analgesic (Z79.891) Test#: [id number] (Tox[employer]ure Select-13)  . Comprehensive metabolic panel    Order Specific Question:  Has the patient fasted?    Answer:  No  . C-reactive protein  . Magnesium  . Sedimentation rate  . Vitamin B12    Indication: Bone Pain (M89.9)  . Vitamin D pnl(25-hydrxy+1,25-dihy)-bld    Imaging Ordered: None  Interventional Therapies: Scheduled: None at this time. PRN Procedures: None at this time, but we may consider diagnostic bilateral lumbar facet block under fluoroscopic guidance and IV sedation. If this is effective, we may consider radiofrequency ablation.    Referral(s) or Consult(s): None at this time.  Medications administered during this visit: Mr. Grubert had no medications administered during this visit.  Future Appointments Date Time Provider [place]  03/07/2016 8:00 AM Milinda Pointer, MD [facility] None    Primary Care Physician: [name], MD Location: [facility] Outpatient Pain Management Facility Note by: Kathlen Brunswick. Dossie Arbour, M.D, DABA,  DABAPM, DABPM, DABIPP, FIPP

## 2015-12-15 DIAGNOSIS — G8929 Other chronic pain: Secondary | ICD-10-CM | POA: Insufficient documentation

## 2015-12-15 DIAGNOSIS — M79605 Pain in left leg: Secondary | ICD-10-CM

## 2015-12-15 DIAGNOSIS — M79604 Pain in right leg: Secondary | ICD-10-CM

## 2015-12-16 ENCOUNTER — Telehealth: Payer: Self-pay | Admitting: Pain Medicine

## 2015-12-16 NOTE — Telephone Encounter (Signed)
Would like to know labs result please, informed patient phys had to look at them first and then would let patient's know

## 2015-12-16 NOTE — Telephone Encounter (Signed)
Patient advised labs wnl

## 2015-12-17 LAB — TOXASSURE SELECT 13 (MW), URINE: PDF: 0

## 2015-12-17 NOTE — Progress Notes (Signed)
Quick Note:  Lab results reviewed and found to be within normal limits. ______ 

## 2015-12-17 NOTE — Progress Notes (Signed)
Quick Note:   Normal fasting (NPO x 8 hours) glucose levels are between 65-99 mg/dl, with 2 hour fasting, levels are usually less than 140 mg/dl. Any random blood glucose level greater than 200 mg/dl is considered to be Diabetes.  ______ 

## 2015-12-19 DIAGNOSIS — R079 Chest pain, unspecified: Secondary | ICD-10-CM | POA: Diagnosis not present

## 2015-12-19 DIAGNOSIS — I480 Paroxysmal atrial fibrillation: Secondary | ICD-10-CM | POA: Diagnosis not present

## 2015-12-19 DIAGNOSIS — R0602 Shortness of breath: Secondary | ICD-10-CM | POA: Diagnosis not present

## 2015-12-26 DIAGNOSIS — E119 Type 2 diabetes mellitus without complications: Secondary | ICD-10-CM | POA: Diagnosis not present

## 2015-12-26 DIAGNOSIS — R931 Abnormal findings on diagnostic imaging of heart and coronary circulation: Secondary | ICD-10-CM | POA: Diagnosis not present

## 2015-12-26 DIAGNOSIS — G4733 Obstructive sleep apnea (adult) (pediatric): Secondary | ICD-10-CM | POA: Diagnosis not present

## 2015-12-26 DIAGNOSIS — I471 Supraventricular tachycardia: Secondary | ICD-10-CM | POA: Diagnosis not present

## 2015-12-26 DIAGNOSIS — J4540 Moderate persistent asthma, uncomplicated: Secondary | ICD-10-CM | POA: Diagnosis not present

## 2015-12-26 DIAGNOSIS — I480 Paroxysmal atrial fibrillation: Secondary | ICD-10-CM | POA: Diagnosis not present

## 2015-12-26 DIAGNOSIS — R079 Chest pain, unspecified: Secondary | ICD-10-CM | POA: Diagnosis not present

## 2015-12-26 DIAGNOSIS — I10 Essential (primary) hypertension: Secondary | ICD-10-CM | POA: Diagnosis not present

## 2015-12-26 DIAGNOSIS — I491 Atrial premature depolarization: Secondary | ICD-10-CM | POA: Diagnosis not present

## 2016-01-05 DIAGNOSIS — L409 Psoriasis, unspecified: Secondary | ICD-10-CM | POA: Diagnosis not present

## 2016-01-05 DIAGNOSIS — M150 Primary generalized (osteo)arthritis: Secondary | ICD-10-CM | POA: Diagnosis not present

## 2016-01-05 DIAGNOSIS — M1990 Unspecified osteoarthritis, unspecified site: Secondary | ICD-10-CM | POA: Diagnosis not present

## 2016-01-11 ENCOUNTER — Other Ambulatory Visit: Payer: Self-pay | Admitting: Specialist

## 2016-01-11 DIAGNOSIS — R942 Abnormal results of pulmonary function studies: Secondary | ICD-10-CM | POA: Diagnosis not present

## 2016-01-11 DIAGNOSIS — R0602 Shortness of breath: Secondary | ICD-10-CM

## 2016-01-11 DIAGNOSIS — J449 Chronic obstructive pulmonary disease, unspecified: Secondary | ICD-10-CM | POA: Diagnosis not present

## 2016-01-11 DIAGNOSIS — G4733 Obstructive sleep apnea (adult) (pediatric): Secondary | ICD-10-CM | POA: Diagnosis not present

## 2016-01-20 ENCOUNTER — Ambulatory Visit: Payer: PPO | Admitting: Anesthesiology

## 2016-01-20 ENCOUNTER — Ambulatory Visit
Admission: RE | Admit: 2016-01-20 | Discharge: 2016-01-20 | Disposition: A | Discharge status: Home / Self Care | Payer: PPO | Source: Ambulatory Visit | Attending: Gastroenterology | Admitting: Gastroenterology

## 2016-01-20 ENCOUNTER — Encounter
Admission: RE | Disposition: A | Discharge status: Home / Self Care | Payer: Self-pay | Source: Ambulatory Visit | Attending: Gastroenterology

## 2016-01-20 ENCOUNTER — Encounter: Payer: Self-pay | Admitting: Anesthesiology

## 2016-01-20 DIAGNOSIS — L4050 Arthropathic psoriasis, unspecified: Secondary | ICD-10-CM | POA: Insufficient documentation

## 2016-01-20 DIAGNOSIS — I739 Peripheral vascular disease, unspecified: Secondary | ICD-10-CM | POA: Diagnosis not present

## 2016-01-20 DIAGNOSIS — D649 Anemia, unspecified: Secondary | ICD-10-CM | POA: Insufficient documentation

## 2016-01-20 DIAGNOSIS — Z884 Allergy status to anesthetic agent status: Secondary | ICD-10-CM | POA: Insufficient documentation

## 2016-01-20 DIAGNOSIS — Z888 Allergy status to other drugs, medicaments and biological substances status: Secondary | ICD-10-CM | POA: Insufficient documentation

## 2016-01-20 DIAGNOSIS — Z7952 Long term (current) use of systemic steroids: Secondary | ICD-10-CM | POA: Diagnosis not present

## 2016-01-20 DIAGNOSIS — I471 Supraventricular tachycardia: Secondary | ICD-10-CM | POA: Insufficient documentation

## 2016-01-20 DIAGNOSIS — K259 Gastric ulcer, unspecified as acute or chronic, without hemorrhage or perforation: Secondary | ICD-10-CM | POA: Diagnosis not present

## 2016-01-20 DIAGNOSIS — G4730 Sleep apnea, unspecified: Secondary | ICD-10-CM | POA: Insufficient documentation

## 2016-01-20 DIAGNOSIS — E039 Hypothyroidism, unspecified: Secondary | ICD-10-CM | POA: Insufficient documentation

## 2016-01-20 DIAGNOSIS — Z6838 Body mass index (BMI) 38.0-38.9, adult: Secondary | ICD-10-CM | POA: Diagnosis not present

## 2016-01-20 DIAGNOSIS — K2950 Unspecified chronic gastritis without bleeding: Secondary | ICD-10-CM | POA: Diagnosis not present

## 2016-01-20 DIAGNOSIS — I10 Essential (primary) hypertension: Secondary | ICD-10-CM | POA: Insufficient documentation

## 2016-01-20 DIAGNOSIS — Z79899 Other long term (current) drug therapy: Secondary | ICD-10-CM | POA: Insufficient documentation

## 2016-01-20 DIAGNOSIS — K2960 Other gastritis without bleeding: Secondary | ICD-10-CM | POA: Diagnosis not present

## 2016-01-20 DIAGNOSIS — M1990 Unspecified osteoarthritis, unspecified site: Secondary | ICD-10-CM | POA: Diagnosis not present

## 2016-01-20 DIAGNOSIS — R011 Cardiac murmur, unspecified: Secondary | ICD-10-CM | POA: Diagnosis not present

## 2016-01-20 DIAGNOSIS — M810 Age-related osteoporosis without current pathological fracture: Secondary | ICD-10-CM | POA: Diagnosis not present

## 2016-01-20 DIAGNOSIS — Z8719 Personal history of other diseases of the digestive system: Secondary | ICD-10-CM | POA: Diagnosis not present

## 2016-01-20 DIAGNOSIS — J45909 Unspecified asthma, uncomplicated: Secondary | ICD-10-CM | POA: Diagnosis not present

## 2016-01-20 DIAGNOSIS — E669 Obesity, unspecified: Secondary | ICD-10-CM | POA: Diagnosis not present

## 2016-01-20 DIAGNOSIS — Z881 Allergy status to other antibiotic agents status: Secondary | ICD-10-CM | POA: Diagnosis not present

## 2016-01-20 DIAGNOSIS — K2210 Ulcer of esophagus without bleeding: Secondary | ICD-10-CM | POA: Insufficient documentation

## 2016-01-20 DIAGNOSIS — Z882 Allergy status to sulfonamides status: Secondary | ICD-10-CM | POA: Insufficient documentation

## 2016-01-20 DIAGNOSIS — K2970 Gastritis, unspecified, without bleeding: Secondary | ICD-10-CM | POA: Diagnosis not present

## 2016-01-20 DIAGNOSIS — Z85828 Personal history of other malignant neoplasm of skin: Secondary | ICD-10-CM | POA: Insufficient documentation

## 2016-01-20 DIAGNOSIS — K3189 Other diseases of stomach and duodenum: Secondary | ICD-10-CM | POA: Diagnosis not present

## 2016-01-20 DIAGNOSIS — K2900 Acute gastritis without bleeding: Secondary | ICD-10-CM | POA: Insufficient documentation

## 2016-01-20 DIAGNOSIS — Z885 Allergy status to narcotic agent status: Secondary | ICD-10-CM | POA: Diagnosis not present

## 2016-01-20 DIAGNOSIS — M329 Systemic lupus erythematosus, unspecified: Secondary | ICD-10-CM | POA: Diagnosis not present

## 2016-01-20 DIAGNOSIS — Z880 Allergy status to penicillin: Secondary | ICD-10-CM | POA: Insufficient documentation

## 2016-01-20 DIAGNOSIS — K317 Polyp of stomach and duodenum: Secondary | ICD-10-CM | POA: Insufficient documentation

## 2016-01-20 DIAGNOSIS — K449 Diaphragmatic hernia without obstruction or gangrene: Secondary | ICD-10-CM | POA: Insufficient documentation

## 2016-01-20 DIAGNOSIS — Z8619 Personal history of other infectious and parasitic diseases: Secondary | ICD-10-CM | POA: Insufficient documentation

## 2016-01-20 DIAGNOSIS — R109 Unspecified abdominal pain: Secondary | ICD-10-CM | POA: Diagnosis not present

## 2016-01-20 DIAGNOSIS — K210 Gastro-esophageal reflux disease with esophagitis: Secondary | ICD-10-CM | POA: Insufficient documentation

## 2016-01-20 DIAGNOSIS — R1013 Epigastric pain: Secondary | ICD-10-CM | POA: Diagnosis not present

## 2016-01-20 DIAGNOSIS — K209 Esophagitis, unspecified: Secondary | ICD-10-CM | POA: Diagnosis not present

## 2016-01-20 HISTORY — PX: ESOPHAGOGASTRODUODENOSCOPY (EGD) WITH PROPOFOL: SHX5813

## 2016-01-20 SURGERY — ESOPHAGOGASTRODUODENOSCOPY (EGD) WITH PROPOFOL
Anesthesia: General

## 2016-01-20 MED ORDER — MIDAZOLAM HCL 5 MG/5ML IJ SOLN
INTRAMUSCULAR | Status: DC | PRN
  Administered 2016-01-20: 2 mg via INTRAVENOUS

## 2016-01-20 MED ORDER — FENTANYL CITRATE (PF) 100 MCG/2ML IJ SOLN
INTRAMUSCULAR | Status: DC | PRN
  Administered 2016-01-20: 50 ug via INTRAVENOUS

## 2016-01-20 MED ORDER — SODIUM CHLORIDE 0.9 % IV SOLN
INTRAVENOUS | Status: DC
  Administered 2016-01-20: 1000 mL via INTRAVENOUS
  Administered 2016-01-20: 08:00:00 via INTRAVENOUS

## 2016-01-20 MED ORDER — PROPOFOL 10 MG/ML IV BOLUS
INTRAVENOUS | Status: DC | PRN
  Administered 2016-01-20: 60 mg via INTRAVENOUS

## 2016-01-20 MED ORDER — LIDOCAINE HCL (CARDIAC) 20 MG/ML IV SOLN
INTRAVENOUS | Status: DC | PRN
  Administered 2016-01-20: 100 mg via INTRAVENOUS

## 2016-01-20 MED ORDER — IPRATROPIUM-ALBUTEROL 0.5-2.5 (3) MG/3ML IN SOLN
3.0000 mL | Freq: Once | RESPIRATORY_TRACT | Status: AC
  Administered 2016-01-20: 3 mL via RESPIRATORY_TRACT
  Filled 2016-01-20: qty 3

## 2016-01-20 MED ORDER — SODIUM CHLORIDE 0.9 % IV SOLN: INTRAVENOUS | Status: DC

## 2016-01-20 MED ORDER — PROPOFOL 500 MG/50ML IV EMUL
INTRAVENOUS | Status: DC | PRN
  Administered 2016-01-20: 180 ug/kg/min via INTRAVENOUS

## 2016-01-20 NOTE — Anesthesia Postprocedure Evaluation (Signed)
Anesthesia Post Note  Patient: James [name]  Procedure(s) Performed: Procedure(s) (LRB): ESOPHAGOGASTRODUODENOSCOPY (EGD) WITH PROPOFOL (N/A)  Patient location during evaluation: Endoscopy Anesthesia Type: General Level of consciousness: awake and alert Pain management: pain level controlled Vital Signs Assessment: post-procedure vital signs reviewed and stable Respiratory status: spontaneous breathing, nonlabored ventilation, respiratory function stable and patient connected to nasal cannula oxygen Cardiovascular status: blood pressure returned to baseline and stable Postop Assessment: no signs of nausea or vomiting Anesthetic complications: no    Last Vitals:  Filed Vitals:   01/20/16 0840 01/20/16 0850  BP: 110/71 107/66  Pulse: 81 78  Temp:    Resp: 16 21    Last Pain:  Filed Vitals:   01/20/16 0858  PainSc: 4                  Precious Haws Kayelyn Lemon

## 2016-01-20 NOTE — Anesthesia Preprocedure Evaluation (Addendum)
Anesthesia Evaluation  Patient identified by MRN, date of birth, ID band Patient awake    Reviewed: Allergy & Precautions, H&P , NPO status , Patient's Chart, lab work & pertinent test results  History of Anesthesia Complications (+) PONV and history of anesthetic complications  Airway Mallampati: III  TM Distance: <3 FB Neck ROM: limited    Dental  (+) Poor Dentition, Chipped, Missing   Pulmonary shortness of breath and with exertion, asthma , sleep apnea and Continuous Positive Airway Pressure Ventilation , former smoker,    Pulmonary exam normal breath sounds clear to auscultation       Cardiovascular Exercise Tolerance: Good hypertension, (-) angina+ DOE  (-) Past MI Normal cardiovascular exam+ Valvular Problems/Murmurs AI  Rhythm:regular Rate:Normal     Neuro/Psych  Neuromuscular disease negative psych ROS   GI/Hepatic hiatal hernia, PUD, GERD  Medicated and Controlled,(+) Hepatitis -, C  Endo/Other  diabetes, Type 2Hypothyroidism   Renal/GU negative Renal ROS  negative genitourinary   Musculoskeletal  (+) Arthritis ,   Abdominal   Peds  Hematology negative hematology ROS (+)   Anesthesia Other Findings Past Medical History:   Allergy                                                      Anemia                                                       Arthritis                                                    Asthma                                                       Blood transfusion without reported diagnosis                 Cancer ([place])                                                   Comment:skin   Cataract                                                       Comment:bilateral   GERD (gastroesophageal reflux disease)                       Heart murmur  Sleep apnea                                                  Hypertension                                                  Osteoporosis                                                 Thyroid disease                                              Hypothyroidism                                               Supraventricular tachycardia ([facility])                           Obesity                                                      Lupus ([place])                                                  Hiatal hernia                                                Peripheral nerve disease ([facility])                               Hepatitis C                                                  Bursitis                                                     Tendonitis  Gastritis                                                    Complication of implanted electronic neurostim* 09/13/2015    Psoriatic arthritis ([place])                                    Bronchitis                                                  Past Surgical History:   SPINE SURGERY                                                   Comment:cervical and thoracic and lumbar   EYE SURGERY                                                     Comment:removal of foreign object   FRACTURE SURGERY                                Right                Comment:arm   FRACTURE SURGERY                                Bilateral                Comment:fingers   FRACTURE SURGERY                                Bilateral                Comment:wrist   JOINT REPLACEMENT                               Right                Comment:radial head   SPINAL CORD STIMULATOR IMPLANT                                SPINAL CORD STIMULATOR REMOVAL                                PATELLA ARTHROPLASTY                                          SHOULDER ARTHROSCOPY  Bilateral             BMI    Body Mass Index   38.74 kg/m 2      Reproductive/Obstetrics negative OB ROS                             Anesthesia Physical Anesthesia Plan  ASA: III  Anesthesia Plan: General   Post-op Pain Management:    Induction:   Airway Management Planned:   Additional Equipment:   Intra-op Plan:   Post-operative Plan:   Informed Consent: I have reviewed the patients History and Physical, chart, labs and discussed the procedure including the risks, benefits and alternatives for the proposed anesthesia with the patient or authorized representative who has indicated his/her understanding and acceptance.   Dental Advisory Given  Plan Discussed with: Anesthesiologist, CRNA and Surgeon  Anesthesia Plan Comments:         Anesthesia Quick Evaluation

## 2016-01-20 NOTE — H&P (Signed)
Outpatient short stay form Pre-procedure 01/20/2016 7:45 AM Lollie Sails MD  Primary Physician: [name] MD  Reason for visit:  EGD  History of present illness:  Patient is a 60 year old male presenting today for EGD. He has a history of epigastric pain that is been chronic for quite some time. This has been evaluated in the past. He was found have gastric ulcer in the past. The symptoms seem be worsening over the past 3-4 months. He is taking pantoprazole 40 mg a day and Carafate on occasion. A take between one to 4 daily. It is some associated nausea but no vomiting. Rare heartburn. There is no dysphagia.    Current facility-administered medications:  .  0.9 %  sodium chloride infusion, , Intravenous, Continuous, Lollie Sails, MD, Last Rate: 20 mL/hr at 01/20/16 0719, 1,000 mL at 01/20/16 0719 .  0.9 %  sodium chloride infusion, , Intravenous, Continuous, Lollie Sails, MD  Prescriptions prior to admission  Medication Sig Dispense Refill Last Dose  . albuterol (PROAIR HFA) 108 (90 BASE) MCG/ACT inhaler Inhale 2 puffs into the lungs every 4 (four) hours as needed for wheezing or shortness of breath.    01/19/2016 at Unknown time  . Ascorbic Acid (VITAMIN C) 1000 MG tablet Take 1,000 mg by mouth daily.   Past Week at Unknown time  . Cholecalciferol (VITAMIN D-3) 1000 UNITS CAPS Take 2,000 Units by mouth daily.    Past Week at Unknown time  . cyanocobalamin 500 MCG tablet Take 500 mcg by mouth daily.   Past Week at Unknown time  . fentaNYL (DURAGESIC - DOSED MCG/HR) 12 MCG/HR Place 1 patch (12.5 mcg total) onto the skin every 3 (three) days. 10 patch 0 01/19/2016 at Unknown time  . fentaNYL (DURAGESIC - DOSED MCG/HR) 12 MCG/HR Place 1 patch (12.5 mcg total) onto the skin every 3 (three) days. 10 patch 0 01/19/2016 at Unknown time  . fentaNYL (DURAGESIC - DOSED MCG/HR) 12 MCG/HR Place 1 patch (12.5 mcg total) onto the skin every 3 (three) days. 10 patch 0 01/19/2016 at Unknown  time  . folic acid (FOLVITE) 1 MG tablet Take 1 mg by mouth 2 (two) times daily.    Past Week at Unknown time  . Ipratropium-Albuterol (COMBIVENT) 20-100 MCG/ACT AERS respimat Inhale 2 puffs into the lungs every 6 (six) hours as needed for wheezing or shortness of breath.    01/19/2016 at Unknown time  . levothyroxine (SYNTHROID, LEVOTHROID) 25 MCG tablet TAKE 1 TABLET EVERY MORNING ON AN EMPTY STOMACH WITH A GLASS OF WATERAT LEAST 30 TO 60 MINUTES BEFORE BREAKFAST   01/19/2016 at Unknown time  . Loratadine 10 MG CAPS Take 10 mg by mouth daily.    01/19/2016 at Unknown time  . methotrexate 50 MG/2ML injection Inject 8 mg as directed once a week.    01/19/2016 at Unknown time  . METOPROLOL TARTRATE PO Take 100 mg by mouth 2 (two) times daily.   01/20/2016 at 0400  . Multiple Vitamins-Minerals (MULTIVITAMIN WITH MINERALS) tablet 1 tablet daily.    Past Week at Unknown time  . Oxycodone HCl 20 MG TABS Take 1 tablet (20 mg total) by mouth every 8 (eight) hours as needed. 90 tablet 0 01/19/2016 at Unknown time  . Oxycodone HCl 20 MG TABS Take 1 tablet (20 mg total) by mouth every 8 (eight) hours. 90 tablet 0 01/19/2016 at Unknown time  . Oxycodone HCl 20 MG TABS Take 1 tablet (20 mg total) by mouth  every 8 (eight) hours as needed. 90 tablet 0 01/19/2016 at Unknown time  . oxymetazoline (NASAL DECONGESTANT SPRAY) 0.05 % nasal spray Place 1 spray into both nostrils daily as needed.    01/19/2016 at Unknown time  . pantoprazole (PROTONIX) 40 MG tablet Take 40 mg by mouth 2 (two) times daily.    01/19/2016 at Unknown time  . predniSONE (DELTASONE) 5 MG tablet Take 5 mg by mouth daily with breakfast. Tapering dose   Past Month at Unknown time  . sucralfate (CARAFATE) 1 G tablet Take 1 g by mouth 4 (four) times daily.    Past Week at Unknown time  . temazepam (RESTORIL) 15 MG capsule Take 15 mg by mouth at bedtime as needed for sleep.   Past Week at Unknown time  . testosterone cypionate (DEPOTESTOSTERONE CYPIONATE) 200  MG/ML injection INJECT 1 ML INTRAMUSCULARY EVERY 2 WEEKS   01/19/2016 at Unknown time     Allergies  Allergen Reactions  . Lactose Other (See Comments)    Stomach bleed  . Nsaids   . Prednisone   . Procaine   . Sulfa Antibiotics   . Talwin [Pentazocine] Other (See Comments)    tachycardia  . Triamcinolone Acetonide   . Codeine Rash    tachycardia  . Kenalog [Triamcinolone] Rash  . Penicillins Rash    tachycardia     Past Medical History  Diagnosis Date  . Allergy   . Anemia   . Arthritis   . Asthma   . Blood transfusion without reported diagnosis   . Cancer ([place])     skin  . Cataract     bilateral  . GERD (gastroesophageal reflux disease)   . Heart murmur   . Sleep apnea   . Hypertension   . Osteoporosis   . Thyroid disease   . Hypothyroidism   . Supraventricular tachycardia ([place])   . Obesity   . Lupus ([place])   . Hiatal hernia   . Peripheral nerve disease ([place])   . Hepatitis C   . Bursitis   . Tendonitis   . Gastritis   . 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 Psoriatic arthritis ([place])   . Bronchitis     Review of systems:      Physical Exam    Heart and lungs: Regular rate and rhythm without rub or gallop, lungs are bilaterally clear.    HEENT: Normocephalic atraumatic eyes are anicteric    Other:     Pertinant exam for procedure: Soft mild tenderness to palpation in the epigastric region. Bowel sounds positive normoactive. No rebound.    Planned proceedures: EGD and indicated procedures. I have discussed the risks benefits and complications of procedures to include not limited to bleeding, infection, perforation and the risk of sedation and the patient wishes to proceed.    Lollie Sails, MD Gastroenterology 01/20/2016  7:45 AM

## 2016-01-20 NOTE — Anesthesia Procedure Notes (Signed)
Date/Time: 01/20/2016 7:45 AM Performed by: Allean Found Pre-anesthesia Checklist: Patient identified, Emergency Drugs available, Suction available, Patient being monitored and Timeout performed Patient Re-evaluated:Patient Re-evaluated prior to inductionOxygen Delivery Method: Nasal cannula Intubation Type: IV induction

## 2016-01-20 NOTE — Op Note (Signed)
[facility] Gastroenterology Patient Name: James [name] Procedure Date: 01/20/2016 7:48 AM MRN: [id number] Account #: [id number] Date of Birth: May 21, 1956 Admit Type: Outpatient Age: 60 Room: [facility] ENDO ROOM 3 Gender: Male Note Status: Finalized Procedure:            Upper GI endoscopy Indications:          Epigastric abdominal pain Providers:            Lollie Sails, MD Referring MD:         Youlanda Roys. Lovie Macadamia, MD (Referring MD) Medicines:            Monitored Anesthesia Care Complications:        No immediate complications. Procedure:            Pre-Anesthesia Assessment:                       - ASA Grade Assessment: III - A patient with severe                        systemic disease.                       After obtaining informed consent, the endoscope was                        passed under direct vision. Throughout the procedure,                        the patient's blood pressure, pulse, and oxygen                        saturations were monitored continuously. The Endoscope                        was introduced through the mouth, and advanced to the                        third part of duodenum. The upper GI endoscopy was                        accomplished without difficulty. The patient tolerated                        the procedure well. Findings:      LA Grade C (one or more mucosal breaks continuous between tops of 2 or       more mucosal folds, less than 75% circumference) esophagitis with no       bleeding was found. Biopsies were taken with a cold forceps for       histology.      The exam of the esophagus was otherwise normal.      Diffuse minimal inflammation characterized by congestion (edema) and       erythema was found in the gastric body. Biopsies were taken with a cold       forceps for histology.      A single 3 mm sessile polyp with no bleeding and no stigmata of recent       bleeding was found in the gastric body. This was  biopsied with a cold       forceps for histology.      Patchy  mild inflammation characterized by congestion (edema) and       erosions was found at the incisura and in the prepyloric region of the       stomach. Biopsies were taken with a cold forceps for histology. Biopsies       were taken with a cold forceps for Helicobacter pylori testing.      The cardia and gastric fundus were normal on retroflexion.      Localized mildly scalloped mucosa was found in the second portion of the       duodenum. Biopsies were taken with a cold forceps for histology. Impression:           - LA Grade C erosive esophagitis. Biopsied.                       - Bile gastritis. Biopsied.                       - A single gastric polyp. Biopsied.                       - Erosive gastritis. Biopsied.                       - Scalloped mucosa was found in the duodenum, not                        consistent with celiac disease. Biopsied. Recommendation:       - Discharge patient to home.                       - Continue present medications. - Await pathology                        results. CT abdomen, consider gastric emptying study if                        negative. Procedure Code(s):    --- Professional ---                       [id number], Esophagogastroduodenoscopy, flexible, transoral;                        with biopsy, single or multiple Diagnosis Code(s):    --- Professional ---                       K20.8, Other esophagitis                       K29.60, Other gastritis without bleeding                       K31.7, Polyp of stomach and duodenum                       K31.89, Other diseases of stomach and duodenum                       R10.13, Epigastric pain CPT copyright 2016 [employer]. All rights reserved. The codes documented in this report are preliminary and upon coder review may  be revised to meet current compliance requirements. Lollie Sails, MD 01/20/2016 8:18:43 AM  This report  has been signed electronically. Number of Addenda: 0 Note Initiated On: 01/20/2016 7:48 AM      [facility]

## 2016-01-20 NOTE — Transfer of Care (Signed)
Immediate Anesthesia Transfer of Care Note  Patient: DOIS BILLING  Procedure(s) Performed: Procedure(s): ESOPHAGOGASTRODUODENOSCOPY (EGD) WITH PROPOFOL (N/A)  Patient Location: PACU  Anesthesia Type:General  Level of Consciousness: alert   Airway & Oxygen Therapy: Patient Spontanous Breathing and Patient connected to nasal cannula oxygen  Post-op Assessment: Report given to RN and Post -op Vital signs reviewed and stable  Post vital signs: Reviewed and stable  Last Vitals:  Filed Vitals:   01/20/16 0709  BP: 131/83  Pulse: 74  Temp: 36.2 C  Resp: 16    Complications: No apparent anesthesia complications

## 2016-01-23 LAB — SURGICAL PATHOLOGY

## 2016-01-25 ENCOUNTER — Ambulatory Visit
Admission: RE | Admit: 2016-01-25 | Discharge: 2016-01-25 | Disposition: A | Discharge status: Home / Self Care | Payer: PPO | Source: Ambulatory Visit | Attending: Specialist | Admitting: Specialist

## 2016-01-25 ENCOUNTER — Encounter: Payer: Self-pay | Admitting: Gastroenterology

## 2016-01-25 DIAGNOSIS — R942 Abnormal results of pulmonary function studies: Secondary | ICD-10-CM | POA: Diagnosis not present

## 2016-01-25 DIAGNOSIS — R0602 Shortness of breath: Secondary | ICD-10-CM | POA: Diagnosis not present

## 2016-01-25 DIAGNOSIS — I2510 Atherosclerotic heart disease of native coronary artery without angina pectoris: Secondary | ICD-10-CM | POA: Insufficient documentation

## 2016-01-25 DIAGNOSIS — R918 Other nonspecific abnormal finding of lung field: Secondary | ICD-10-CM | POA: Diagnosis not present

## 2016-02-03 ENCOUNTER — Other Ambulatory Visit: Payer: Self-pay | Admitting: Specialist

## 2016-02-03 DIAGNOSIS — R918 Other nonspecific abnormal finding of lung field: Secondary | ICD-10-CM

## 2016-02-16 DIAGNOSIS — Z79899 Other long term (current) drug therapy: Secondary | ICD-10-CM | POA: Diagnosis not present

## 2016-02-16 DIAGNOSIS — L409 Psoriasis, unspecified: Secondary | ICD-10-CM | POA: Diagnosis not present

## 2016-02-16 DIAGNOSIS — M150 Primary generalized (osteo)arthritis: Secondary | ICD-10-CM | POA: Diagnosis not present

## 2016-02-16 DIAGNOSIS — M1990 Unspecified osteoarthritis, unspecified site: Secondary | ICD-10-CM | POA: Diagnosis not present

## 2016-02-16 DIAGNOSIS — R0602 Shortness of breath: Secondary | ICD-10-CM | POA: Diagnosis not present

## 2016-02-20 DIAGNOSIS — K210 Gastro-esophageal reflux disease with esophagitis: Secondary | ICD-10-CM | POA: Diagnosis not present

## 2016-02-20 DIAGNOSIS — J4540 Moderate persistent asthma, uncomplicated: Secondary | ICD-10-CM | POA: Diagnosis not present

## 2016-02-20 DIAGNOSIS — E039 Hypothyroidism, unspecified: Secondary | ICD-10-CM | POA: Diagnosis not present

## 2016-02-20 DIAGNOSIS — E291 Testicular hypofunction: Secondary | ICD-10-CM | POA: Diagnosis not present

## 2016-02-20 DIAGNOSIS — R5382 Chronic fatigue, unspecified: Secondary | ICD-10-CM | POA: Diagnosis not present

## 2016-02-20 DIAGNOSIS — G8929 Other chronic pain: Secondary | ICD-10-CM | POA: Diagnosis not present

## 2016-02-23 DIAGNOSIS — R918 Other nonspecific abnormal finding of lung field: Secondary | ICD-10-CM | POA: Diagnosis not present

## 2016-02-23 DIAGNOSIS — R05 Cough: Secondary | ICD-10-CM | POA: Diagnosis not present

## 2016-02-23 DIAGNOSIS — G4733 Obstructive sleep apnea (adult) (pediatric): Secondary | ICD-10-CM | POA: Diagnosis not present

## 2016-02-23 DIAGNOSIS — J310 Chronic rhinitis: Secondary | ICD-10-CM | POA: Diagnosis not present

## 2016-02-23 DIAGNOSIS — J449 Chronic obstructive pulmonary disease, unspecified: Secondary | ICD-10-CM | POA: Diagnosis not present

## 2016-02-23 DIAGNOSIS — J45909 Unspecified asthma, uncomplicated: Secondary | ICD-10-CM | POA: Diagnosis not present

## 2016-03-07 ENCOUNTER — Ambulatory Visit: Payer: PPO | Attending: Pain Medicine | Admitting: Pain Medicine

## 2016-03-07 ENCOUNTER — Encounter: Payer: Self-pay | Admitting: Pain Medicine

## 2016-03-07 VITALS — BP 129/75 | HR 73 | Temp 97.8°F | Resp 16 | Ht 71.0 in | Wt 275.0 lb

## 2016-03-07 DIAGNOSIS — M25562 Pain in left knee: Secondary | ICD-10-CM | POA: Insufficient documentation

## 2016-03-07 DIAGNOSIS — L4050 Arthropathic psoriasis, unspecified: Secondary | ICD-10-CM | POA: Insufficient documentation

## 2016-03-07 DIAGNOSIS — G4730 Sleep apnea, unspecified: Secondary | ICD-10-CM | POA: Diagnosis not present

## 2016-03-07 DIAGNOSIS — M791 Myalgia: Secondary | ICD-10-CM | POA: Diagnosis not present

## 2016-03-07 DIAGNOSIS — J40 Bronchitis, not specified as acute or chronic: Secondary | ICD-10-CM | POA: Diagnosis not present

## 2016-03-07 DIAGNOSIS — M4726 Other spondylosis with radiculopathy, lumbar region: Secondary | ICD-10-CM | POA: Insufficient documentation

## 2016-03-07 DIAGNOSIS — G8929 Other chronic pain: Secondary | ICD-10-CM

## 2016-03-07 DIAGNOSIS — Z79891 Long term (current) use of opiate analgesic: Secondary | ICD-10-CM | POA: Diagnosis not present

## 2016-03-07 DIAGNOSIS — M549 Dorsalgia, unspecified: Secondary | ICD-10-CM | POA: Diagnosis not present

## 2016-03-07 DIAGNOSIS — R079 Chest pain, unspecified: Secondary | ICD-10-CM | POA: Insufficient documentation

## 2016-03-07 DIAGNOSIS — Z87891 Personal history of nicotine dependence: Secondary | ICD-10-CM | POA: Diagnosis not present

## 2016-03-07 DIAGNOSIS — M25559 Pain in unspecified hip: Secondary | ICD-10-CM | POA: Diagnosis not present

## 2016-03-07 DIAGNOSIS — I491 Atrial premature depolarization: Secondary | ICD-10-CM | POA: Diagnosis not present

## 2016-03-07 DIAGNOSIS — K219 Gastro-esophageal reflux disease without esophagitis: Secondary | ICD-10-CM | POA: Insufficient documentation

## 2016-03-07 DIAGNOSIS — K259 Gastric ulcer, unspecified as acute or chronic, without hemorrhage or perforation: Secondary | ICD-10-CM | POA: Insufficient documentation

## 2016-03-07 DIAGNOSIS — E739 Lactose intolerance, unspecified: Secondary | ICD-10-CM | POA: Insufficient documentation

## 2016-03-07 DIAGNOSIS — M8580 Other specified disorders of bone density and structure, unspecified site: Secondary | ICD-10-CM | POA: Diagnosis not present

## 2016-03-07 DIAGNOSIS — R918 Other nonspecific abnormal finding of lung field: Secondary | ICD-10-CM | POA: Insufficient documentation

## 2016-03-07 DIAGNOSIS — E039 Hypothyroidism, unspecified: Secondary | ICD-10-CM | POA: Insufficient documentation

## 2016-03-07 DIAGNOSIS — G5641 Causalgia of right upper limb: Secondary | ICD-10-CM | POA: Diagnosis not present

## 2016-03-07 DIAGNOSIS — M545 Low back pain, unspecified: Secondary | ICD-10-CM

## 2016-03-07 DIAGNOSIS — E119 Type 2 diabetes mellitus without complications: Secondary | ICD-10-CM | POA: Insufficient documentation

## 2016-03-07 DIAGNOSIS — M25561 Pain in right knee: Secondary | ICD-10-CM | POA: Diagnosis not present

## 2016-03-07 DIAGNOSIS — I4891 Unspecified atrial fibrillation: Secondary | ICD-10-CM | POA: Diagnosis not present

## 2016-03-07 DIAGNOSIS — Z5181 Encounter for therapeutic drug level monitoring: Secondary | ICD-10-CM | POA: Diagnosis not present

## 2016-03-07 DIAGNOSIS — F1190 Opioid use, unspecified, uncomplicated: Secondary | ICD-10-CM | POA: Diagnosis not present

## 2016-03-07 DIAGNOSIS — M47816 Spondylosis without myelopathy or radiculopathy, lumbar region: Secondary | ICD-10-CM | POA: Insufficient documentation

## 2016-03-07 DIAGNOSIS — M542 Cervicalgia: Secondary | ICD-10-CM | POA: Diagnosis not present

## 2016-03-07 DIAGNOSIS — M79606 Pain in leg, unspecified: Secondary | ICD-10-CM

## 2016-03-07 DIAGNOSIS — M5412 Radiculopathy, cervical region: Secondary | ICD-10-CM

## 2016-03-07 DIAGNOSIS — M1990 Unspecified osteoarthritis, unspecified site: Secondary | ICD-10-CM | POA: Diagnosis not present

## 2016-03-07 MED ORDER — OXYCODONE HCL 20 MG PO TABS: 20.0000 mg | ORAL_TABLET | Freq: Three times a day (TID) | ORAL | Status: DC | PRN

## 2016-03-07 MED ORDER — FENTANYL 12 MCG/HR TD PT72: 12.5000 ug | MEDICATED_PATCH | TRANSDERMAL | Status: DC

## 2016-03-07 MED ORDER — OXYCODONE HCL 20 MG PO TABS: 20.0000 mg | ORAL_TABLET | Freq: Three times a day (TID) | ORAL | Status: DC

## 2016-03-07 NOTE — Progress Notes (Signed)
Patient's Name: James [name]  Patient type: Established  MRN: [id number]  Service setting: Ambulatory outpatient  DOB: Nov 15, 1955  Location: [facility]  DOS: 03/07/2016  Primary Care Physician: [name], MD  Note by: Kathlen Brunswick. Dossie Arbour, M.D, DABA, DABAPM, DABPM, Milagros Evener, FIPP  Referring Physician: [name], MD  Specialty: Board-Certified Interventional Pain Management  Last Visit to Pain Management: 12/16/2015   Primary Reason(s) for Visit: Encounter for prescription drug management (Level of risk: moderate) CC: Back Pain   HPI  James [name] is a 60 y.o. year old, male patient, who returns today as an established patient. He has Airway hyperreactivity; Atrial fibrillation ([place]); Narrowing of intervertebral disc space; Abnormal echocardiogram; Gastric ulcer; Gastric catarrh; Acid reflux; Awareness of heartbeats; Hemorrhoid; Hepatitis; BP (high blood pressure); Lactose intolerance; Osteopenia; APC (atrial premature contractions); Apnea, sleep; Supraventricular tachycardia ([place]); Chronic pain; Long term current use of opiate analgesic; Long term prescription opiate use; Opiate use (120 MME/Day); Opiate dependence ([place]); Encounter for therapeutic drug level monitoring; Chronic low back pain (Location of Primary Source of Pain) (Bilateral) (R>L); Lumbar spondylosis; Chronic neck pain (Right); Cervical spondylosis (C7 intravertebral body cyst); Chronic cervical radicular pain (Right); Chronic lumbar radicular pain (Location of Secondary source of pain) (Bilateral) (R>L) (L5 Dermatome); Osteoarthritis; Chronic hip pain; Chronic knee pain (Bilateral) (R>L); Complex regional pain syndrome type II of upper extremity (Right); Chronic upper extremity pain (Right); 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); Controlled type 2 diabetes mellitus without complication ([place]); Chest pain; Acquired hypothyroidism; Chronic lower extremity pain (Location of Secondary source of pain) (Bilateral) (R>L); Psoriasis with arthropathy ([place]); and Adult hypothyroidism on his problem list.. His primarily concern today is the Back Pain   Pain Assessment: Self-Reported Pain Score: 3  Reported level is compatible with observation Pain Type: Chronic pain Pain Location: Back Pain Orientation: Lower, Mid Pain Descriptors / Indicators: Throbbing, Pressure Pain Frequency: Constant  The patient comes into the clinics today for pharmacological management of his chronic pain. I last saw this patient on 12/16/2015. His body mass index is 38.37 kg/(m^2). The patient  reports that he does not use illicit drugs.  Date of Last Visit: 12/12/15 Service Provided on Last Visit: Med Refill  Controlled Substance Pharmacotherapy Assessment & REMS (Risk Evaluation and Mitigation Strategy)  Analgesic: Fentanyl patch 12.5 mcg/h every 72 hours + oxycodone IR 20 mg every 6 hours (60 mg/day) Pill Count: Oxycodone 20 mg 54 out of 90 remaining. Filled 02-24-16. Duragesic patch 12 mcg/hr #5 out of 5 (1 box) remaining. Filled 02-24-16 MME/day: 120 mg/day Pharmacokinetics: Onset of action (Liberation/Absorption): Within expected pharmacological parameters Time to Peak effect (Distribution): Timing and results are as within normal expected parameters Duration of action (Metabolism/Excretion): Within normal limits for medication Pharmacodynamics: Analgesic Effect: More than 50% Activity Facilitation: Medication(s) allow patient to sit, stand, walk, and do the basic ADLs Perceived Effectiveness: Described as relatively  effective, allowing for increase in activities of daily living (ADL) Side-effects or Adverse reactions: None reported Monitoring: [place] PMP: Online review of the past 58-month period conducted. Compliant with practice rules and regulations UDS Results/interpretation: The patient's last UDS was done on 12/12/2015 and it came back within normal limits with the exception of the absence of  temazepam which he had indicated that he was taking and it did not show up. Medication Assessment Form: Reviewed. Patient indicates being compliant with therapy Treatment compliance: Compliant Risk Assessment: Aberrant Behavior: None observed today Substance Use Disorder (SUD) Risk Level: No change since last visit Risk of opioid abuse or dependence: 0.7-3.0% with doses ? 36 MME/day and 6.1-26% with doses ? 120 MME/day. Opioid Risk Tool (ORT) Score: Total Score: 0 Low Risk for SUD (Score <3) Depression Scale Score: PHQ-2: PHQ-2 Total Score: 0 No depression (0) PHQ-9: PHQ-9 Total Score: 0 No depression (0-4)  Pharmacologic Plan: No change in therapy, at this time  Laboratory Chemistry  Inflammation Markers Lab Results  Component Value Date   ESRSEDRATE 4 12/12/2015   CRP <0.5 12/12/2015    Renal Function Lab Results  Component Value Date   BUN 13 12/12/2015   CREATININE 1.19 12/12/2015   GFRAA >60 12/12/2015   GFRNONAA >60 12/12/2015    Hepatic Function Lab Results  Component Value Date   AST 30 12/12/2015   ALT 31 12/12/2015   ALBUMIN 4.5 12/12/2015    Electrolytes Lab Results  Component Value Date   NA 138 12/12/2015   K 4.4 12/12/2015   CL 103 12/12/2015   CALCIUM 9.7 12/12/2015   MG 2.1 12/12/2015    Pain Modulating Vitamins No results found for: [place], [id number], [id number], [id number], VITAMINB12  Coagulation Parameters No results found for: INR, LABPROT  Note: I personally reviewed the above data. Results made available to patient.  Recent Diagnostic Imaging  Ct Chest  Wo Contrast  01/25/2016  CLINICAL DATA:  Difficulty breathing over the past 6 to 8 months. History of multiple episodes bronchitis. EXAM: CT CHEST WITHOUT CONTRAST TECHNIQUE: Multidetector CT imaging of the chest was performed following the standard protocol without IV contrast. COMPARISON:  None. FINDINGS: Mediastinum/Nodes: No chest wall, supraclavicular or axillary lymphadenopathy. Thyroid gland is grossly normal. The heart is normal in size. No pericardial effusion. Three-vessel coronary artery calcifications are noted. The aorta is normal in caliber. Scattered atherosclerotic calcifications at the aortic arch. No mediastinal or hilar mass or adenopathy. The esophagus is grossly normal. Lungs/Pleura: Mild emphysematous changes are noted. No pulmonary infiltrates or pleural effusion. Small scattered pulmonary nodules are noted. These are likely benign but will require followup. 2-3 mm nodules are noted in the left upper lobe on image 19. Small calcified granuloma is noted in the superior segment of the left lower lobe on image number 22. Small calcified granuloma noted in the left lower lobe on image number 34. 5 mm nodule in the lingula on image number 34. Sub 3 mm pulmonary nodule in the right upper lobe on image number 26. Two 3.5 mm nodules in the right lower lobe on image number 30. Upper abdomen: No significant findings. Musculoskeletal: No significant findings. IMPRESSION: 1. Multiple small bilateral pulmonary nodules as described above. Some of these are calcified granulomas. The other lesions are likely benign but I would recommend a followup noncontrast chest CT in 12 months to document stability. 2. No mediastinal or hilar mass or adenopathy. 3. Mild emphysematous changes and pulmonary scarring. 4. Three-vessel coronary artery calcifications, advanced for age. Electronically Signed   By: Marijo Sanes M.D.   On: 01/25/2016 11:18    Meds  The patient has a current medication list which includes the  following prescription(s): albuterol, vitamin c, budesonide-formoterol, vitamin d-3, cyanocobalamin, fentanyl, fentanyl, fentanyl, folic acid, ipratropium-albuterol, levothyroxine, loratadine, methotrexate, metoprolol tartrate, multivitamin with minerals, oxycodone hcl, oxycodone hcl,  oxycodone hcl, pantoprazole, prednisone, sucralfate, temazepam, testosterone cypionate, tizanidine, and oxymetazoline.  Current Outpatient Prescriptions on File Prior to Visit  Medication Sig  . albuterol (PROAIR HFA) 108 (90 BASE) MCG/ACT inhaler Inhale 2 puffs into the lungs every 4 (four) hours as needed for wheezing or shortness of breath.   . Ascorbic Acid (VITAMIN C) 1000 MG tablet Take 1,000 mg by mouth daily.  . Cholecalciferol (VITAMIN D-3) 1000 UNITS CAPS Take 2,000 Units by mouth daily.   . cyanocobalamin 500 MCG tablet Take 500 mcg by mouth daily.  . folic acid (FOLVITE) 1 MG tablet Take 1 mg by mouth 2 (two) times daily.   . Ipratropium-Albuterol (COMBIVENT) 20-100 MCG/ACT AERS respimat Inhale 2 puffs into the lungs every 6 (six) hours as needed for wheezing or shortness of breath.   . levothyroxine (SYNTHROID, LEVOTHROID) 25 MCG tablet TAKE 1 TABLET EVERY MORNING ON AN EMPTY STOMACH WITH A GLASS OF WATERAT LEAST 30 TO 60 MINUTES BEFORE BREAKFAST  . Loratadine 10 MG CAPS Take 10 mg by mouth daily.   . methotrexate 50 MG/2ML injection Inject 8 mg as directed once a week.   Marland Kitchen METOPROLOL TARTRATE PO Take 100 mg by mouth 2 (two) times daily.  . Multiple Vitamins-Minerals (MULTIVITAMIN WITH MINERALS) tablet 1 tablet daily.   . pantoprazole (PROTONIX) 40 MG tablet Take 40 mg by mouth 2 (two) times daily.   . predniSONE (DELTASONE) 5 MG tablet Take 5 mg by mouth daily with breakfast. Tapering dose  . sucralfate (CARAFATE) 1 G tablet Take 1 g by mouth 4 (four) times daily.   . temazepam (RESTORIL) 15 MG capsule Take 15 mg by mouth at bedtime as needed for sleep.  Marland Kitchen testosterone cypionate (DEPOTESTOSTERONE  CYPIONATE) 200 MG/ML injection INJECT 1 ML INTRAMUSCULARY EVERY 2 WEEKS  . oxymetazoline (NASAL DECONGESTANT SPRAY) 0.05 % nasal spray Place 1 spray into both nostrils daily as needed.    No current facility-administered medications on file prior to visit.    ROS  Constitutional: Denies any fever or chills Gastrointestinal: No reported hemesis, hematochezia, vomiting, or acute GI distress Musculoskeletal: Denies any acute onset joint swelling, redness, loss of ROM, or weakness Neurological: No reported episodes of acute onset apraxia, aphasia, dysarthria, agnosia, amnesia, paralysis, loss of coordination, or loss of consciousness  Allergies  James [name] is allergic to lactose; nsaids; prednisone; procaine; sulfa antibiotics; talwin; codeine; and penicillins.  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 Family: family history includes Arthritis in his father and mother; COPD in his father; Cancer in his father and mother; Diabetes in his mother; Hypertension in his father and mother; Stroke in his mother. Surgical:  has past surgical history that includes Spine surgery; Eye surgery; Fracture surgery (Right); Fracture surgery (Bilateral); Fracture surgery (Bilateral); Joint replacement (Right); Spinal cord stimulator implant; Spinal cord stimulator removal; Patella arthroplasty; Shoulder arthroscopy (Bilateral); and Esophagogastroduodenoscopy (egd) with propofol (N/A,  01/20/2016). Tobacco:  reports that he has quit smoking. His smoking use included Cigarettes. He does not have any smokeless tobacco history on file. Alcohol:  reports that he does not drink alcohol. Drug:  reports that he does not  use illicit drugs.  Constitutional Exam  Vitals: Blood pressure 129/75, pulse 73, temperature 97.8 F (36.6 C), temperature source Oral, resp. rate 16, height 5\' 11"  (1.803 m), weight 275 lb (124.739 kg), SpO2 99 %. General appearance: Well nourished, well developed, and well hydrated. In no acute distress Calculated BMI/Body habitus: Body mass index is 38.37 kg/(m^2). (35-39.9 kg/m2) Severe obesity (Class II) - 136% higher incidence of chronic pain Psych/Mental status: Alert and oriented x 3 (person, place, & time) Eyes: PERLA Respiratory: No evidence of acute respiratory distress  Cervical Spine Exam  Inspection: No masses, redness, or swelling Alignment: Symmetrical ROM: Functional: Limited ROM Active: Decreased ROM Stability: No instability detected Muscle strength & Tone: Functionally intact Sensory: Unimpaired Palpation: Tender  Upper Extremity (UE) Exam    Side: Right upper extremity  Side: Left upper extremity  Inspection: No masses, redness, swelling, or asymmetry  Inspection: No masses, redness, swelling, or asymmetry  ROM:  ROM:  Functional: Unrestricted ROM  Functional: Unrestricted ROM  Active: Adequate ROM  Active: Adequate ROM  Muscle strength & Tone: Functionally intact  Muscle strength & Tone: Functionally intact  Sensory: Pain pattern appears Dermatomal, following a C8 distribution   Sensory: Unimpaired, following a C8 distribution.   Palpation: Non-contributory  Palpation: Non-contributory   Thoracic Spine Exam  Inspection: No masses, redness, or swelling Alignment: Symmetrical ROM: Functional: Unrestricted ROM Active: Adequate ROM Stability: No instability detected Sensory: Unimpaired Muscle strength & Tone: Functionally  intact Palpation: No complaints of tenderness  Lumbar Spine Exam  Inspection: No masses, redness, or swelling Alignment: Symmetrical ROM: Functional: Limited ROM Active: Decreased ROM Stability: No instability detected Muscle strength & Tone: Functionally intact Sensory: Unimpaired Palpation: Tender Provocative Tests: Lumbar Hyperextension and rotation test: Positive for bilateral lumbar facet pain. Patrick's Maneuver: deferred  Gait & Posture Assessment  Gait: Unaffected Posture: WNL  Lower Extremity Exam    Side: Right lower extremity  Side: Left lower extremity  Inspection: No masses, redness, swelling, or asymmetry ROM:  Inspection: No masses, redness, swelling, or asymmetry ROM:  Functional: Unrestricted ROM  Functional: Unrestricted ROM  Active: Adequate ROM  Active: Adequate ROM  Muscle strength & Tone: Functionally intact  Muscle strength & Tone: Functionally intact  Sensory: Pain pattern appears Dermatomal, following an L5 distribution.   Sensory: Pain pattern appears Dermatomal, following an L5 distribution.   Palpation: Non-contributory  Palpation: Non-contributory   Assessment & Plan  Primary Diagnosis & Pertinent Problem List: The primary encounter diagnosis was Chronic pain. Diagnoses of Encounter for therapeutic drug level monitoring, Long term current use of opiate analgesic, Opiate use (120 MME/Day), Chronic low back pain (Location of Primary Source of Pain) (Bilateral) (R>L), Chronic pain of lower extremity, unspecified laterality, Chronic cervical radicular pain (Right), and Chronic neck pain (Right) were also pertinent to this visit.  Visit Diagnosis: 1. Chronic pain   2. Encounter for therapeutic drug level monitoring   3. Long term current use of opiate analgesic   4. Opiate use (120 MME/Day)   5. Chronic low back pain (Location of Primary Source of Pain) (Bilateral) (R>L)   6. Chronic pain of lower extremity, unspecified laterality   7. Chronic  cervical radicular pain (Right)   8. Chronic neck pain (Right)     Problems updated and reviewed during this visit: Problem  Chronic Pain  Opiate use (120 MME/Day)   Fentanyl patch 12.5 every 72 hours + oxycodone IR 20 mg every 6 hours (60 mg/day)   Controlled Type 2 Diabetes Mellitus  Without Complication ([facility])  Psoriasis With Arthropathy ([facility])   Overview:  Overview:  Overview:  Methotrexate   Chest Pain  Adult Hypothyroidism    Problem-specific Plan(s): No problem-specific assessment & plan notes found for this encounter.  No new assessment & plan notes have been filed under this hospital service since the last note was generated. Service: Pain Management   Plan of Care   Problem List Items Addressed This Visit      High   Chronic cervical radicular pain (Right) (Chronic)   Relevant Medications   tiZANidine (ZANAFLEX) 4 MG capsule   Other Relevant Orders   CERVICAL EPIDURAL STEROID INJECTION   Chronic low back pain (Location of Primary Source of Pain) (Bilateral) (R>L) (Chronic)   Relevant Medications   tiZANidine (ZANAFLEX) 4 MG capsule   fentaNYL (DURAGESIC - DOSED MCG/HR) 12 MCG/HR   fentaNYL (DURAGESIC - DOSED MCG/HR) 12 MCG/HR   fentaNYL (DURAGESIC - DOSED MCG/HR) 12 MCG/HR   Oxycodone HCl 20 MG TABS   Oxycodone HCl 20 MG TABS   Oxycodone HCl 20 MG TABS   Other Relevant Orders   LUMBAR FACET(MEDIAL BRANCH NERVE BLOCK) MBNB   Chronic lower extremity pain (Location of Secondary source of pain) (Bilateral) (R>L) (Chronic)   Relevant Orders   LUMBAR EPIDURAL STEROID INJECTION   Chronic neck pain (Right) (Chronic)   Relevant Medications   tiZANidine (ZANAFLEX) 4 MG capsule   fentaNYL (DURAGESIC - DOSED MCG/HR) 12 MCG/HR   fentaNYL (DURAGESIC - DOSED MCG/HR) 12 MCG/HR   fentaNYL (DURAGESIC - DOSED MCG/HR) 12 MCG/HR   Oxycodone HCl 20 MG TABS   Oxycodone HCl 20 MG TABS   Oxycodone HCl 20 MG TABS   Other Relevant Orders   CERVICAL FACET (MEDIAL BRANCH NERVE  BLOCK)    Chronic pain - Primary (Chronic)   Relevant Medications   tiZANidine (ZANAFLEX) 4 MG capsule   fentaNYL (DURAGESIC - DOSED MCG/HR) 12 MCG/HR   fentaNYL (DURAGESIC - DOSED MCG/HR) 12 MCG/HR   fentaNYL (DURAGESIC - DOSED MCG/HR) 12 MCG/HR   Oxycodone HCl 20 MG TABS   Oxycodone HCl 20 MG TABS   Oxycodone HCl 20 MG TABS     Medium   Encounter for therapeutic drug level monitoring   Long term current use of opiate analgesic (Chronic)   Relevant Orders   ToxASSURE Select 13 (MW), Urine   Opiate use (120 MME/Day) (Chronic)       Pharmacotherapy (Medications Ordered): Meds ordered this encounter  Medications  . fentaNYL (DURAGESIC - DOSED MCG/HR) 12 MCG/HR    Sig: Place 1 patch (12.5 mcg total) onto the skin every 3 (three) days.    Dispense:  10 patch    Refill:  0    Do not place this medication, or any other prescription from our practice, on "Automatic Refill". Patient may have prescription filled one day early if pharmacy is closed on scheduled refill date. Do not fill until: 03/25/16 To last until: 04/24/16  . fentaNYL (DURAGESIC - DOSED MCG/HR) 12 MCG/HR    Sig: Place 1 patch (12.5 mcg total) onto the skin every 3 (three) days.    Dispense:  10 patch    Refill:  0    Do not place this medication, or any other prescription from our practice, on "Automatic Refill". Patient may have prescription filled one day early if pharmacy is closed on scheduled refill date. Do not fill until: 04/24/16 To last until: 05/24/16  . fentaNYL (DURAGESIC - DOSED MCG/HR) 12 MCG/HR  Sig: Place 1 patch (12.5 mcg total) onto the skin every 3 (three) days.    Dispense:  10 patch    Refill:  0    Do not place this medication, or any other prescription from our practice, on "Automatic Refill". Patient may have prescription filled one day early if pharmacy is closed on scheduled refill date. Do not fill until: 05/24/16 To last until: 06/23/16  . Oxycodone HCl 20 MG TABS    Sig: Take 1  tablet (20 mg total) by mouth every 8 (eight) hours as needed.    Dispense:  90 tablet    Refill:  0    Do not place this medication, or any other prescription from our practice, on "Automatic Refill". Patient may have prescription filled one day early if pharmacy is closed on scheduled refill date. Do not fill until: 03/25/16 To last until: 04/24/16  . Oxycodone HCl 20 MG TABS    Sig: Take 1 tablet (20 mg total) by mouth every 8 (eight) hours.    Dispense:  90 tablet    Refill:  0    Do not place this medication, or any other prescription from our practice, on "Automatic Refill". Patient may have prescription filled one day early if pharmacy is closed on scheduled refill date. Do not fill until: 04/24/16 To last until: 05/24/16  . Oxycodone HCl 20 MG TABS    Sig: Take 1 tablet (20 mg total) by mouth every 8 (eight) hours as needed.    Dispense:  90 tablet    Refill:  0    Do not place this medication, or any other prescription from our practice, on "Automatic Refill". Patient may have prescription filled one day early if pharmacy is closed on scheduled refill date. Do not fill until: 05/24/16 To last until: 06/23/16    [facility] & Procedure Ordered: Orders Placed This Encounter  Procedures  . CERVICAL EPIDURAL STEROID INJECTION  . CERVICAL FACET (MEDIAL BRANCH NERVE BLOCK)   . LUMBAR EPIDURAL STEROID INJECTION  . LUMBAR FACET(MEDIAL BRANCH NERVE BLOCK) MBNB  . ToxASSURE Select 13 (MW), Urine    Imaging Ordered: None  Interventional Therapies: Scheduled:  None at this point.    Considering:  Palliative treatments.    PRN Procedures:   1. For the arm pain and numbness, right-sided cervical epidural steroid injection under fluoroscopic guidance and IV sedation.  2. For the lower extremity pain, right-sided L4-5 lumbar epidural steroid injection under fluoroscopic guidance and IV sedation.  3. For the low back pain, diagnostic bilateral lumbar facet block under fluoroscopic  guidance and IV sedation.  4. For the neck pain, diagnostic bilateral cervical facet block under fluoroscopic guidance and IV sedation.    Referral(s) or Consult(s): None at this time.  New Prescriptions   No medications on file    Medications administered during this visit: Mr. Herdegen had no medications administered during this visit.  Requested PM Follow-up: Return in about 3 months (around 05/30/2016) for Medication Management, (3-Mo), Procedure (PRN - Patient will call).  Future Appointments Date Time Provider [place]  06/13/2016 7:40 AM Milinda Pointer, MD [facility] None  01/24/2017 10:30 AM [facility]    Primary Care Physician: [name], MD Location: [facility] Note by: Kathlen Brunswick. Dossie Arbour, M.D, DABA, DABAPM, DABPM, DABIPP, FIPP  Pain Score Disclaimer: We use the NRS-11 scale. This is a self-reported, subjective measurement of pain severity with only modest accuracy. It is used primarily to identify changes within  a particular patient. It must be understood that outpatient pain scales are significantly less accurate that those used for research, where they can be applied under ideal controlled circumstances with minimal exposure to variables. In reality, the score is likely to be a combination of pain intensity and pain affect, where pain affect describes the degree of emotional arousal or changes in action readiness caused by the sensory experience of pain. Factors such as social and work situation, setting, emotional state, anxiety levels, expectation, and prior pain experience may influence pain perception and show large inter-individual differences that may also be affected by time variables.

## 2016-03-07 NOTE — Patient Instructions (Signed)
Facet Blocks Patient Information  Description: The facets are joints in the spine between the vertebrae.  Like any joints in the body, facets can become irritated and painful.  Arthritis can also effect the facets.  By injecting steroids and local anesthetic in and around these joints, we can temporarily block the nerve supply to them.  Steroids act directly on irritated nerves and tissues to reduce selling and inflammation which often leads to decreased pain.  Facet blocks may be done anywhere along the spine from the neck to the low back depending upon the location of your pain.   After numbing the skin with local anesthetic (like Novocaine), a small needle is passed onto the facet joints under x-ray guidance.  You may experience a sensation of pressure while this is being done.  The entire block usually lasts about 15-25 minutes.   Conditions which may be treated by facet blocks:   Low back/buttock pain  Neck/shoulder pain  Certain types of headaches  Preparation for the injection:  1. Do not eat any solid food or dairy products within 8 hours of your appointment. 2. You may drink clear liquid up to 3 hours before appointment.  Clear liquids include water, black coffee, juice or soda.  No milk or cream please. 3. You may take your regular medication, including pain medications, with a sip of water before your appointment.  Diabetics should hold regular insulin (if taken separately) and take 1/2 normal NPH dose the morning of the procedure.  Carry some sugar containing items with you to your appointment. 4. A driver must accompany you and be prepared to drive you home after your procedure. 5. Bring all your current medications with you. 6. An IV may be inserted and sedation may be given at the discretion of the physician. 7. A blood pressure cuff, EKG and other monitors will often be applied during the procedure.  Some patients may need to have extra oxygen administered for a short  period. 8. You will be asked to provide medical information, including your allergies and medications, prior to the procedure.  We must know immediately if you are taking blood thinners (like Coumadin/Warfarin) or if you are allergic to IV iodine contrast (dye).  We must know if you could possible be pregnant.  Possible side-effects:   Bleeding from needle site  Infection (rare, may require surgery)  Nerve injury (rare)  Numbness & tingling (temporary)  Difficulty urinating (rare, temporary)  Spinal headache (a headache worse with upright posture)  Light-headedness (temporary)  Pain at injection site (serveral days)  Decreased blood pressure (rare, temporary)  Weakness in arm/leg (temporary)  Pressure sensation in back/neck (temporary)   Call if you experience:   Fever/chills associated with headache or increased back/neck pain  Headache worsened by an upright position  New onset, weakness or numbness of an extremity below the injection site  Hives or difficulty breathing (go to the emergency room)  Inflammation or drainage at the injection site(s)  Severe back/neck pain greater than usual  New symptoms which are concerning to you  Please note:  Although the local anesthetic injected can often make your back or neck feel good for several hours after the injection, the pain will likely return. It takes 3-7 days for steroids to work.  You may not notice any pain relief for at least one week.  If effective, we will often do a series of 2-3 injections spaced 3-6 weeks apart to maximally decrease your pain.  After the initial   series, you may be a candidate for a more permanent nerve block of the facets.  If you have any questions, please call [phone] [facility]  Epidural Steroid Injection Patient Information  Description: The epidural space surrounds the nerves as they exit the spinal cord.  In some patients, the nerves can  be compressed and inflamed by a bulging disc or a tight spinal canal (spinal stenosis).  By injecting steroids into the epidural space, we can bring irritated nerves into direct contact with a potentially helpful medication.  These steroids act directly on the irritated nerves and can reduce swelling and inflammation which often leads to decreased pain.  Epidural steroids may be injected anywhere along the spine and from the neck to the low back depending upon the location of your pain.   After numbing the skin with local anesthetic (like Novocaine), a small needle is passed into the epidural space slowly.  You may experience a sensation of pressure while this is being done.  The entire block usually last less than 10 minutes.  Conditions which may be treated by epidural steroids:   Low back and leg pain  Neck and arm pain  Spinal stenosis  Post-laminectomy syndrome  Herpes zoster (shingles) pain  Pain from compression fractures  Preparation for the injection:  1. Do not eat any solid food or dairy products within 8 hours of your appointment.  2. You may drink clear liquids up to 3 hours before appointment.  Clear liquids include water, black coffee, juice or soda.  No milk or cream please. 3. You may take your regular medication, including pain medications, with a sip of water before your appointment  Diabetics should hold regular insulin (if taken separately) and take 1/2 normal NPH dos the morning of the procedure.  Carry some sugar containing items with you to your appointment. 4. A driver must accompany you and be prepared to drive you home after your procedure.  5. Bring all your current medications with your. 6. An IV may be inserted and sedation may be given at the discretion of the physician.   7. A blood pressure cuff, EKG and other monitors will often be applied during the procedure.  Some patients may need to have extra oxygen administered for a short period. 8. You will be  asked to provide medical information, including your allergies, prior to the procedure.  We must know immediately if you are taking blood thinners (like Coumadin/Warfarin)  Or if you are allergic to IV iodine contrast (dye). We must know if you could possible be pregnant.  Possible side-effects:  Bleeding from needle site  Infection (rare, may require surgery)  Nerve injury (rare)  Numbness & tingling (temporary)  Difficulty urinating (rare, temporary)  Spinal headache ( a headache worse with upright posture)  Light -headedness (temporary)  Pain at injection site (several days)  Decreased blood pressure (temporary)  Weakness in arm/leg (temporary)  Pressure sensation in back/neck (temporary)  Call if you experience:  Fever/chills associated with headache or increased back/neck pain.  Headache worsened by an upright position.  New onset weakness or numbness of an extremity below the injection site  Hives or difficulty breathing (go to the emergency room)  Inflammation or drainage at the infection site  Severe back/neck pain  Any new symptoms which are concerning to you  Please note:  Although the local anesthetic injected can often make your back or neck feel good for several hours after the injection,   the pain will likely return.  It takes 3-7 days for steroids to work in the epidural space.  You may not notice any pain relief for at least that one week.  If effective, we will often do a series of three injections spaced 3-6 weeks apart to maximally decrease your pain.  After the initial series, we generally will wait several months before considering a repeat injection of the same type.  If you have any questions, please call [phone] [facility] 

## 2016-03-07 NOTE — Progress Notes (Signed)
Safety precautions to be maintained throughout the outpatient stay will include: orient to surroundings, keep bed in low position, maintain call bell within reach at all times, provide assistance with transfer out of bed and ambulation.   Oxycodone 20 mg  54 out of 90 remaining.  Filled 02-24-16. Duragesic patch 12 mcg/hr #5 out of 5  (1 box) remaining. Filled 02-24-16.

## 2016-03-13 LAB — TOXASSURE SELECT 13 (MW), URINE

## 2016-03-14 DIAGNOSIS — G4733 Obstructive sleep apnea (adult) (pediatric): Secondary | ICD-10-CM | POA: Diagnosis not present

## 2016-04-14 DIAGNOSIS — G4733 Obstructive sleep apnea (adult) (pediatric): Secondary | ICD-10-CM | POA: Diagnosis not present

## 2016-05-03 DIAGNOSIS — M150 Primary generalized (osteo)arthritis: Secondary | ICD-10-CM | POA: Diagnosis not present

## 2016-05-03 DIAGNOSIS — M791 Myalgia: Secondary | ICD-10-CM | POA: Diagnosis not present

## 2016-05-03 DIAGNOSIS — M1990 Unspecified osteoarthritis, unspecified site: Secondary | ICD-10-CM | POA: Diagnosis not present

## 2016-05-03 DIAGNOSIS — Z79899 Other long term (current) drug therapy: Secondary | ICD-10-CM | POA: Diagnosis not present

## 2016-05-14 DIAGNOSIS — G4733 Obstructive sleep apnea (adult) (pediatric): Secondary | ICD-10-CM | POA: Diagnosis not present

## 2016-05-24 ENCOUNTER — Telehealth: Payer: Self-pay | Admitting: *Deleted

## 2016-05-24 NOTE — Telephone Encounter (Signed)
I'm sending a request to [employer] for prior authorization for the LESI and I will contact patient to schedule after auth is recvd

## 2016-05-24 NOTE — Telephone Encounter (Signed)
Please schedule patient for lesi with sedation.

## 2016-05-24 NOTE — Telephone Encounter (Signed)
PT CALL PATIENT DUE TO L3, L4 AND DOWN BOTH LEGS HE'S HAVING PAIN.Marland KitchenMarland KitchenTHANKS

## 2016-05-30 ENCOUNTER — Ambulatory Visit: Payer: PPO | Attending: Pain Medicine | Admitting: Pain Medicine

## 2016-05-30 ENCOUNTER — Encounter: Payer: Self-pay | Admitting: Pain Medicine

## 2016-05-30 VITALS — BP 122/97 | HR 69 | Temp 98.0°F | Resp 18 | Ht 71.0 in | Wt 280.0 lb

## 2016-05-30 DIAGNOSIS — K219 Gastro-esophageal reflux disease without esophagitis: Secondary | ICD-10-CM | POA: Diagnosis not present

## 2016-05-30 DIAGNOSIS — L4050 Arthropathic psoriasis, unspecified: Secondary | ICD-10-CM | POA: Diagnosis not present

## 2016-05-30 DIAGNOSIS — Z79891 Long term (current) use of opiate analgesic: Secondary | ICD-10-CM | POA: Insufficient documentation

## 2016-05-30 DIAGNOSIS — M8580 Other specified disorders of bone density and structure, unspecified site: Secondary | ICD-10-CM | POA: Insufficient documentation

## 2016-05-30 DIAGNOSIS — I10 Essential (primary) hypertension: Secondary | ICD-10-CM | POA: Diagnosis not present

## 2016-05-30 DIAGNOSIS — M47816 Spondylosis without myelopathy or radiculopathy, lumbar region: Secondary | ICD-10-CM | POA: Diagnosis not present

## 2016-05-30 DIAGNOSIS — I491 Atrial premature depolarization: Secondary | ICD-10-CM | POA: Insufficient documentation

## 2016-05-30 DIAGNOSIS — K649 Unspecified hemorrhoids: Secondary | ICD-10-CM | POA: Insufficient documentation

## 2016-05-30 DIAGNOSIS — E739 Lactose intolerance, unspecified: Secondary | ICD-10-CM | POA: Diagnosis not present

## 2016-05-30 DIAGNOSIS — I471 Supraventricular tachycardia: Secondary | ICD-10-CM | POA: Insufficient documentation

## 2016-05-30 DIAGNOSIS — Z6839 Body mass index (BMI) 39.0-39.9, adult: Secondary | ICD-10-CM | POA: Insufficient documentation

## 2016-05-30 DIAGNOSIS — E119 Type 2 diabetes mellitus without complications: Secondary | ICD-10-CM | POA: Insufficient documentation

## 2016-05-30 DIAGNOSIS — G9619 Other disorders of meninges, not elsewhere classified: Secondary | ICD-10-CM | POA: Diagnosis not present

## 2016-05-30 DIAGNOSIS — G4730 Sleep apnea, unspecified: Secondary | ICD-10-CM | POA: Diagnosis not present

## 2016-05-30 DIAGNOSIS — M47892 Other spondylosis, cervical region: Secondary | ICD-10-CM | POA: Insufficient documentation

## 2016-05-30 DIAGNOSIS — E039 Hypothyroidism, unspecified: Secondary | ICD-10-CM | POA: Insufficient documentation

## 2016-05-30 DIAGNOSIS — R918 Other nonspecific abnormal finding of lung field: Secondary | ICD-10-CM | POA: Insufficient documentation

## 2016-05-30 DIAGNOSIS — Z87891 Personal history of nicotine dependence: Secondary | ICD-10-CM | POA: Insufficient documentation

## 2016-05-30 DIAGNOSIS — K259 Gastric ulcer, unspecified as acute or chronic, without hemorrhage or perforation: Secondary | ICD-10-CM | POA: Diagnosis not present

## 2016-05-30 DIAGNOSIS — K759 Inflammatory liver disease, unspecified: Secondary | ICD-10-CM | POA: Diagnosis not present

## 2016-05-30 DIAGNOSIS — G8929 Other chronic pain: Secondary | ICD-10-CM | POA: Diagnosis not present

## 2016-05-30 DIAGNOSIS — M542 Cervicalgia: Secondary | ICD-10-CM | POA: Diagnosis not present

## 2016-05-30 DIAGNOSIS — M25562 Pain in left knee: Secondary | ICD-10-CM | POA: Insufficient documentation

## 2016-05-30 DIAGNOSIS — G5641 Causalgia of right upper limb: Secondary | ICD-10-CM | POA: Diagnosis not present

## 2016-05-30 DIAGNOSIS — R079 Chest pain, unspecified: Secondary | ICD-10-CM | POA: Insufficient documentation

## 2016-05-30 DIAGNOSIS — I4891 Unspecified atrial fibrillation: Secondary | ICD-10-CM | POA: Diagnosis not present

## 2016-05-30 DIAGNOSIS — F1190 Opioid use, unspecified, uncomplicated: Secondary | ICD-10-CM

## 2016-05-30 DIAGNOSIS — M5416 Radiculopathy, lumbar region: Secondary | ICD-10-CM

## 2016-05-30 DIAGNOSIS — M545 Low back pain: Secondary | ICD-10-CM | POA: Diagnosis not present

## 2016-05-30 DIAGNOSIS — J45909 Unspecified asthma, uncomplicated: Secondary | ICD-10-CM | POA: Insufficient documentation

## 2016-05-30 DIAGNOSIS — M533 Sacrococcygeal disorders, not elsewhere classified: Secondary | ICD-10-CM | POA: Insufficient documentation

## 2016-05-30 MED ORDER — OXYCODONE HCL 20 MG PO TABS: 20.0000 mg | ORAL_TABLET | Freq: Three times a day (TID) | ORAL | 0 refills | Status: DC

## 2016-05-30 MED ORDER — FENTANYL 12 MCG/HR TD PT72: 12.5000 ug | MEDICATED_PATCH | TRANSDERMAL | 0 refills | Status: DC

## 2016-05-30 MED ORDER — OXYCODONE HCL 20 MG PO TABS: 20.0000 mg | ORAL_TABLET | Freq: Three times a day (TID) | ORAL | 0 refills | Status: DC | PRN

## 2016-05-30 NOTE — Patient Instructions (Signed)
STOP PLAQUINIL FOR 11 DAYS PRIOR TO PROCEDURE DO NOT EAT OR DRINK FOR 8 HOURS BRING A DRIVER TAKE BLOOD PRESSURE MEDICATION THE MORNING OF PROCEDURE          Epidural Steroid Injection Patient Information  Description: The epidural space surrounds the nerves as they exit the spinal cord.  In some patients, the nerves can be compressed and inflamed by a bulging disc or a tight spinal canal (spinal stenosis).  By injecting steroids into the epidural space, we can bring irritated nerves into direct contact with a potentially helpful medication.  These steroids act directly on the irritated nerves and can reduce swelling and inflammation which often leads to decreased pain.  Epidural steroids may be injected anywhere along the spine and from the neck to the low back depending upon the location of your pain.   After numbing the skin with local anesthetic (like Novocaine), a small needle is passed into the epidural space slowly.  You may experience a sensation of pressure while this is being done.  The entire block usually last less than 10 minutes.  Conditions which may be treated by epidural steroids:   Low back and leg pain  Neck and arm pain  Spinal stenosis  Post-laminectomy syndrome  Herpes zoster (shingles) pain  Pain from compression fractures  Preparation for the injection:  1. Do not eat any solid food or dairy products within 8 hours of your appointment.  2. You may drink clear liquids up to 3 hours before appointment.  Clear liquids include water, black coffee, juice or soda.  No milk or cream please. 3. You may take your regular medication, including pain medications, with a sip of water before your appointment  Diabetics should hold regular insulin (if taken separately) and take 1/2 normal NPH dos the morning of the procedure.  Carry some sugar containing items with you to your appointment. 4. A driver must accompany you and be prepared to drive you home after your  procedure.  5. Bring all your current medications with your. 6. An IV may be inserted and sedation may be given at the discretion of the physician.   7. A blood pressure cuff, EKG and other monitors will often be applied during the procedure.  Some patients may need to have extra oxygen administered for a short period. 8. You will be asked to provide medical information, including your allergies, prior to the procedure.  We must know immediately if you are taking blood thinners (like Coumadin/Warfarin)  Or if you are allergic to IV iodine contrast (dye). We must know if you could possible be pregnant.  Possible side-effects:  Bleeding from needle site  Infection (rare, may require surgery)  Nerve injury (rare)  Numbness & tingling (temporary)  Difficulty urinating (rare, temporary)  Spinal headache ( a headache worse with upright posture)  Light -headedness (temporary)  Pain at injection site (several days)  Decreased blood pressure (temporary)  Weakness in arm/leg (temporary)  Pressure sensation in back/neck (temporary)  Call if you experience:  Fever/chills associated with headache or increased back/neck pain.  Headache worsened by an upright position.  New onset weakness or numbness of an extremity below the injection site  Hives or difficulty breathing (go to the emergency room)  Inflammation or drainage at the infection site  Severe back/neck pain  Any new symptoms which are concerning to you  Please note:  Although the local anesthetic injected can often make your back or neck feel good for several hours  after the injection, the pain will likely return.  It takes 3-7 days for steroids to work in the epidural space.  You may not notice any pain relief for at least that one week.  If effective, we will often do a series of three injections spaced 3-6 weeks apart to maximally decrease your pain.  After the initial series, we generally will wait several months  before considering a repeat injection of the same type.  If you have any questions, please call [phone] [facility]

## 2016-05-30 NOTE — Progress Notes (Signed)
Patient's Name: James [name]  Patient type: Established  MRN: [id number]  Service setting: Ambulatory outpatient  DOB: 1955-11-11  Location: [facility]  DOS: 05/30/2016  Primary Care Physician: [name], MD  Note by: Kathlen Brunswick. Dossie Arbour, M.D, DABA, DABAPM, DABPM, Milagros Evener, FIPP  Referring Physician: [name], MD  Specialty: Board-Certified Interventional Pain Management  Last Visit to Pain Management: 05/24/2016   Primary Reason(s) for Visit: Encounter for prescription drug management (Level of risk: moderate) CC: Back Pain (low) and Neck Pain (right primarily)   HPI  Mr. Steger is a 60 y.o. year old, male patient, who returns today as an established patient. He has Airway hyperreactivity; Atrial fibrillation ([place]); Narrowing of intervertebral disc space; Abnormal echocardiogram; Gastric ulcer; Gastric catarrh; Acid reflux; Awareness of heartbeats; Hemorrhoid; Hepatitis; BP (high blood pressure); Lactose intolerance; Osteopenia; APC (atrial premature contractions); Apnea, sleep; Supraventricular tachycardia ([place]); Chronic pain; Long term current use of opiate analgesic; Long term prescription opiate use; Opiate use (120 MME/Day); Opiate dependence ([place]); Encounter for therapeutic drug level monitoring; Chronic low back pain (Location of Primary Source of Pain) (Bilateral) (R>L); Lumbar spondylosis; Chronic neck pain (Right); Cervical spondylosis (C7 intravertebral body cyst); Chronic cervical radicular pain (Right); Chronic lumbar radicular pain (Location of Secondary source of pain) (Bilateral) (L>R) (L5 Dermatome); Osteoarthritis; Chronic hip pain; Chronic knee pain (Bilateral) (R>L); Complex regional pain syndrome type II of upper extremity (Right); Chronic upper extremity pain (Right); 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[place]); Myofascial pain syndrome, cervical (rhomboid muscles) (intermittent); Lumbar facet syndrome  (Location of Primary Source of Pain) (Bilateral) (R>L); Chronic knee pain (Left); Failed back surgical syndrome; Epidural fibrosis; Musculoskeletal pain; Neurogenic pain; Neuropathic pain; Chronic sacroiliac joint pain (Bilateral) (R>L); Psoriatic arthritis ([place]); Controlled type 2 diabetes mellitus without complication ([place]); Chest pain; Acquired hypothyroidism; Chronic lower extremity pain (Location of Secondary source of pain) (Bilateral) (L>R); Psoriasis with arthropathy ([place]); and Hypothyroidism on his problem list.. His primarily concern today is the Back Pain (low) and Neck Pain (right primarily)   Pain Assessment: Self-Reported Pain Score: 4              Reported level is compatible with observation       Pain Type: Chronic pain Pain Location: Back (neck) Pain Orientation: Lower Pain Descriptors / Indicators: Sharp, Numbness, Stabbing Pain Frequency: Constant  The patient comes into the clinics today for pharmacological management of his chronic pain. I last saw this patient on 03/07/2016. The patient  reports that he does not use drugs. His body mass index is 39.05 kg/m. The patient comes in today indicating that he is concerned about the L3 hemangioma. Today I detected time to explain to him what that was. The patient indicates having a lot more pain than usual. The pain is not in a different area compared to last year, it has just increased significantly. He is having difficulty standing up and he is also having pain going all the way down into the bottom of his feet. He indicates that the left leg seems to be worst on the right. Hyperextension and rotation is positive for bilateral lumbar facet pain. Patrick's maneuver was positive bilaterally but not for SI joint pain or hip joint pain it was more in the center of the lower back suggesting a discogenic processes. He indicates having a lot more weakness in the legs and therefore we will be ordering a nerve conduction test of the lower  extremities.  Date of Last Visit: 03/07/16  Service Provided on Last Visit: Med Refill  Controlled Substance Pharmacotherapy Assessment & REMS (Risk Evaluation and Mitigation Strategy)  Analgesic: Fentanyl patch 12.5 mcg/h every 72 hours + oxycodone IR 20 mg every 6 hours (60 mg/day) MME/day: 120 mg/day Pill Count: Oxycodone pill count # 73/90  Filled 05-24-16. Fentanyl patch 12 mcg  #7/10   Filled 05-24-16. Pharmacokinetics: Onset of action (Liberation/Absorption): Within expected pharmacological parameters Time to Peak effect (Distribution): Timing and results are as within normal expected parameters Duration of action (Metabolism/Excretion): Within normal limits for medication Pharmacodynamics: Analgesic Effect: More than 50% Activity Facilitation: Medication(s) allow patient to sit, stand, walk, and do the basic ADLs Perceived Effectiveness: Described as relatively effective, allowing for increase in activities of daily living (ADL) Side-effects or Adverse reactions: None reported Monitoring: [place] PMP: Online review of the past 32-month period conducted. Compliant with practice rules and regulations Last UDS on record: ToxAssure Select 13  Date Value Ref Range Status  03/07/2016 FINAL  Final    Comment:    ==================================================================== TOXASSURE SELECT 13 (MW) ==================================================================== Test                             Result       Flag       Units Drug Present and Declared for Prescription Verification   Oxycodone                      1784         EXPECTED   ng/mg creat   Oxymorphone                    983          EXPECTED   ng/mg creat   Noroxycodone                   >3876        EXPECTED   ng/mg creat   Noroxymorphone                 418          EXPECTED   ng/mg creat    Sources of oxycodone are scheduled prescription medications.    Oxymorphone, noroxycodone, and noroxymorphone are expected     metabolites of oxycodone. Oxymorphone is also available as a    scheduled prescription medication.   Fentanyl                       2            EXPECTED   ng/mg creat   Norfentanyl                    47           EXPECTED   ng/mg creat    Source of fentanyl is a scheduled prescription medication,    including IV, patch, and transmucosal formulations. Norfentanyl    is an expected metabolite of fentanyl. Drug Absent but Declared for Prescription Verification   Temazepam                      Not Detected UNEXPECTED ng/mg creat ==================================================================== Test                      Result    Flag   Units      Ref Range  Creatinine              258              mg/dL      >=20 ==================================================================== Declared Medications:  The flagging and interpretation on this report are based on the  following declared medications.  Unexpected results may arise from  inaccuracies in the declared medications.  **Note: The testing scope of this panel includes these medications:  Fentanyl (Duragesic)  Oxycodone  Temazepam (Restoril)  **Note: The testing scope of this panel does not include following  reported medications:  Albuterol (Combivent)  Albuterol (Proventil)  Budenoside (Symbicort)  Cyanocobalamin  Folic acid (Folvite)  Formoterol (Symbicort)  Ipratropium (Combivent)  Levothyroxine (Synthroid)  Loratadine  Methotrexate  Metoprolol  Multivitamin (MVI)  Oxymetazoline (Afrin)  Pantoprazole (Protonix)  Prednisone (Deltasone)  Sucralfate (Carafate)  Testosterone (Depo-Testosterone)  Tizanidine (Zanaflex)  Vitamin C  Vitamin D3 ==================================================================== For clinical consultation, please call [phone]. ====================================================================    UDS interpretation: Compliant Patient informed of the CDC guidelines and  recommendations to stay away from the concomitant use of benzodiazepines and opioids due to the increased risk of respiratory depression and death. Medication Assessment Form: Reviewed. Patient indicates being compliant with therapy Treatment compliance: Compliant Risk Assessment: Aberrant Behavior: None observed today Substance Use Disorder (SUD) Risk Level: No change since last visit Risk of opioid abuse or dependence: 0.7-3.0% with doses ? 36 MME/day and 6.1-26% with doses ? 120 MME/day. Opioid Risk Tool (ORT) Score: Total Score: 0    Depression Scale Score: PHQ-2: PHQ-2 Total Score: 0       PHQ-9: PHQ-9 Total Score: 0        Pharmacologic Plan: No change in therapy, at this time  Laboratory Chemistry  Inflammation Markers Lab Results  Component Value Date   ESRSEDRATE 4 12/12/2015   CRP <0.5 12/12/2015    Renal Function Lab Results  Component Value Date   BUN 13 12/12/2015   CREATININE 1.19 12/12/2015   GFRAA >60 12/12/2015   GFRNONAA >60 12/12/2015    Hepatic Function Lab Results  Component Value Date   AST 30 12/12/2015   ALT 31 12/12/2015   ALBUMIN 4.5 12/12/2015    Electrolytes Lab Results  Component Value Date   NA 138 12/12/2015   K 4.4 12/12/2015   CL 103 12/12/2015   CALCIUM 9.7 12/12/2015   MG 2.1 12/12/2015    Pain Modulating Vitamins No results found for: [place], VD125OH2TOT, [id number], [id number], 25OHVITD1, 25OHVITD2, 25OHVITD3, VITAMINB12  Coagulation Parameters No results found for: INR, LABPROT, APTT, PLT  Cardiovascular No results found for: BNP, HGB, HCT  Note: Lab results reviewed.  Recent Diagnostic Imaging  Ct Chest Wo Contrast  Result Date: 01/25/2016 CLINICAL DATA:  Difficulty breathing over the past 6 to 8 months. History of multiple episodes bronchitis. EXAM: CT CHEST WITHOUT CONTRAST TECHNIQUE: Multidetector CT imaging of the chest was performed following the standard protocol without IV contrast. COMPARISON:  None. FINDINGS:  Mediastinum/Nodes: No chest wall, supraclavicular or axillary lymphadenopathy. Thyroid gland is grossly normal. The heart is normal in size. No pericardial effusion. Three-vessel coronary artery calcifications are noted. The aorta is normal in caliber. Scattered atherosclerotic calcifications at the aortic arch. No mediastinal or hilar mass or adenopathy. The esophagus is grossly normal. Lungs/Pleura: Mild emphysematous changes are noted. No pulmonary infiltrates or pleural effusion. Small scattered pulmonary nodules are noted. These are likely benign but will require followup. 2-3 mm nodules are noted in the left upper lobe  on image 19. Small calcified granuloma is noted in the superior segment of the left lower lobe on image number 22. Small calcified granuloma noted in the left lower lobe on image number 34. 5 mm nodule in the lingula on image number 34. Sub 3 mm pulmonary nodule in the right upper lobe on image number 26. Two 3.5 mm nodules in the right lower lobe on image number 30. Upper abdomen: No significant findings. Musculoskeletal: No significant findings. IMPRESSION: 1. Multiple small bilateral pulmonary nodules as described above. Some of these are calcified granulomas. The other lesions are likely benign but I would recommend a followup noncontrast chest CT in 12 months to document stability. 2. No mediastinal or hilar mass or adenopathy. 3. Mild emphysematous changes and pulmonary scarring. 4. Three-vessel coronary artery calcifications, advanced for age. Electronically Signed   By: Marijo Sanes M.D.   On: 01/25/2016 11:18    Meds  The patient has a current medication list which includes the following prescription(s): adalimumab, albuterol, azelastine, budesonide-formoterol, fentanyl, fentanyl, fentanyl, hydroxychloroquine, levothyroxine, loratadine, methotrexate, metoprolol, multivitamin with minerals, oxycodone hcl, oxycodone hcl, oxycodone hcl, pantoprazole, prednisone, prednisone,  promethazine, sucralfate, temazepam, testosterone cypionate, tizanidine, albuterol, vitamin c, vitamin c, azelastine, vitamin d-3, vitamin d3, cyanocobalamin, cyanocobalamin, hydroxychloroquine, ipratropium-albuterol, levothyroxine, metoprolol tartrate, oxymetazoline, pantoprazole, sucralfate, temazepam, testosterone cypionate, tizanidine, and tizanidine.  Current Outpatient Prescriptions on File Prior to Visit  Medication Sig  . albuterol (PROAIR HFA) 108 (90 BASE) MCG/ACT inhaler Inhale 2 puffs into the lungs every 4 (four) hours as needed for wheezing or shortness of breath.   . budesonide-formoterol (SYMBICORT) 80-4.5 MCG/ACT inhaler Inhale 2 puffs into the lungs 2 (two) times daily.  Marland Kitchen levothyroxine (SYNTHROID, LEVOTHROID) 25 MCG tablet TAKE 1 TABLET EVERY MORNING ON AN EMPTY STOMACH WITH A GLASS OF WATERAT LEAST 30 TO 60 MINUTES BEFORE BREAKFAST  . Loratadine 10 MG CAPS Take 10 mg by mouth daily.   . methotrexate 50 MG/2ML injection Inject 50 mg/m2 as directed once a week.   . Multiple Vitamins-Minerals (MULTIVITAMIN WITH MINERALS) tablet 1 tablet daily.   . pantoprazole (PROTONIX) 40 MG tablet Take 40 mg by mouth 2 (two) times daily.   . predniSONE (DELTASONE) 5 MG tablet Take 5 mg by mouth daily with breakfast. Tapering dose  . sucralfate (CARAFATE) 1 G tablet Take 1 g by mouth 4 (four) times daily.   . temazepam (RESTORIL) 15 MG capsule Take 15 mg by mouth at bedtime as needed for sleep.  Marland Kitchen testosterone cypionate (DEPOTESTOSTERONE CYPIONATE) 200 MG/ML injection INJECT 1 ML INTRAMUSCULARY EVERY 2 WEEKS  . tiZANidine (ZANAFLEX) 4 MG capsule Take 4 mg by mouth 3 (three) times daily as needed for muscle spasms.  . Ascorbic Acid (VITAMIN C) 1000 MG tablet Take 1,000 mg by mouth daily.  . Cholecalciferol (VITAMIN D-3) 1000 UNITS CAPS Take 2,000 Units by mouth daily.   . cyanocobalamin 500 MCG tablet Take 500 mcg by mouth daily.  . Ipratropium-Albuterol (COMBIVENT) 20-100 MCG/ACT AERS respimat  Inhale 2 puffs into the lungs every 6 (six) hours as needed for wheezing or shortness of breath.   . METOPROLOL TARTRATE PO Take 100 mg by mouth 2 (two) times daily.  Marland Kitchen oxymetazoline (NASAL DECONGESTANT SPRAY) 0.05 % nasal spray Place 1 spray into both nostrils daily as needed.    No current facility-administered medications on file prior to visit.     ROS  Constitutional: Denies any fever or chills Gastrointestinal: No reported hemesis, hematochezia, vomiting, or acute GI distress Musculoskeletal: Denies  any acute onset joint swelling, redness, loss of ROM, or weakness Neurological: No reported episodes of acute onset apraxia, aphasia, dysarthria, agnosia, amnesia, paralysis, loss of coordination, or loss of consciousness  Allergies  Mr. Collinson is allergic to penicillins; lactose; nsaids; prednisone; procaine; sulfa antibiotics; sulfasalazine; talwin [pentazocine]; and codeine.  [place]  Medical:  Mr. 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 Family: family history includes Arthritis in his father and mother; COPD in his father; Cancer in his father and mother; Diabetes in his mother; Hypertension in his father and mother; Stroke in his mother. Surgical:  has a past surgical history that includes Spine surgery; Eye surgery; Fracture surgery (Right); Fracture surgery (Bilateral); Fracture surgery (Bilateral); Joint replacement (Right); Spinal cord stimulator implant; Spinal cord stimulator  removal; Patella arthroplasty; Shoulder arthroscopy (Bilateral); and Esophagogastroduodenoscopy (egd) with propofol (N/A, 01/20/2016). Tobacco:  reports that he has quit smoking. His smoking use included Cigarettes. He does not have any smokeless tobacco history on file. Alcohol:  reports that he does not drink alcohol. Drug:  reports that he does not use drugs.  Constitutional Exam  Vitals: Blood pressure (!) 122/97, pulse 69, temperature 98 F (36.7 C), resp. rate 18, height 5\' 11"  (1.803 m), weight 280 lb (127 kg), SpO2 98 %. General appearance: Well nourished, well developed, and well hydrated. In no acute distress Calculated BMI/Body habitus: Body mass index is 39.05 kg/m. (35-39.9 kg/m2) Severe obesity (Class II) - 136% higher incidence of chronic pain Psych/Mental status: Alert and oriented x 3 (person, place, & time) Eyes: PERLA Respiratory: No evidence of acute respiratory distress  Cervical Spine Exam  Inspection: No masses, redness, or swelling Alignment: Symmetrical ROM: Functional: ROM is within functional limits [facility]) Stability: No instability detected Muscle strength & Tone: Functionally intact Sensory: Unimpaired Palpation: No complaints of tenderness  Upper Extremity (UE) Exam    Side: Right upper extremity  Side: Left upper extremity  Inspection: No masses, redness, swelling, or asymmetry  Inspection: No masses, redness, swelling, or asymmetry  ROM:  ROM:  Functional: ROM is within functional limits [facility])        Functional: ROM is within functional limits [facility])        Muscle strength & Tone: Functionally intact  Muscle strength & Tone: Functionally intact  Sensory: Unimpaired  Sensory: Unimpaired  Palpation: No complaints of tenderness  Palpation: No complaints of tenderness   Thoracic Spine Exam  Inspection: No masses, redness, or swelling Alignment: Symmetrical ROM: Functional: ROM is within functional limits [facility]) Stability: No instability detected Sensory:  Unimpaired Muscle strength & Tone: Functionally intact Palpation: No complaints of tenderness  Lumbar Spine Exam  Inspection: Well healed scar from previous spine surgery detected Alignment: Symmetrical ROM: Functional: Decreased ROM Stability: No instability detected Muscle strength & Tone: Increased muscle tone and tension Sensory: Movement-associated pain Palpation: Area tender to palpation Provocative Tests: Lumbar Hyperextension and rotation test: Positive bilaterally for facet joint pain. Patrick's Maneuver: Positive For pain in the center of the lower back but not for pain over the SI area or hip area.           Gait & Posture Assessment  Ambulation: Unassisted Gait:  Antalgic Posture: Poor   Lower Extremity Exam    Side: Right lower extremity  Side: Left lower extremity  Inspection: No masses, redness, swelling, or asymmetry ROM:  Inspection: No masses, redness, swelling, or asymmetry ROM:  Functional: ROM is within functional limits [facility])        Functional: ROM is within functional limits [facility])        Muscle strength & Tone: Functionally intact  Muscle strength & Tone: Functionally intact  Sensory: Unimpaired  Sensory: Unimpaired  Palpation: No complaints of tenderness  Palpation: No complaints of tenderness   Assessment & Plan  Primary Diagnosis & Pertinent Problem List: The primary encounter diagnosis was Chronic pain. Diagnoses of Long term current use of opiate analgesic, Opiate use (120 MME/Day), Lumbar facet syndrome (Location of Primary Source of Pain) (Bilateral) (R>L), Chronic lumbar radicular pain (Location of Secondary source of pain) (Bilateral) (R>L) (L5 Dermatome), and Chronic low back pain (Location of Primary Source of Pain) (Bilateral) (R>L) were also pertinent to this visit.  Visit Diagnosis: 1. Chronic pain   2. Long term current use of opiate analgesic   3. Opiate use (120 MME/Day)   4. Lumbar facet syndrome (Location of Primary Source of Pain)  (Bilateral) (R>L)   5. Chronic lumbar radicular pain (Location of Secondary source of pain) (Bilateral) (R>L) (L5 Dermatome)   6. Chronic low back pain (Location of Primary Source of Pain) (Bilateral) (R>L)     Problems updated and reviewed during this visit: Problem  Chronic lower extremity pain (Location of Secondary source of pain) (Bilateral) (L>R)  Chronic lumbar radicular pain (Location of Secondary source of pain) (Bilateral) (L>R) (L5 Dermatome)  Hypothyroidism    Problem-specific Plan(s): No problem-specific Assessment & Plan notes found for this encounter.  No new Assessment & Plan notes have been filed under this hospital service since the last note was generated. Service: Pain Management   Plan of Care   Problem List Items Addressed This Visit      High   Chronic low back pain (Location of Primary Source of Pain) (Bilateral) (R>L) (Chronic)   Relevant Medications   fentaNYL (DURAGESIC - DOSED MCG/HR) 12 MCG/HR   fentaNYL (DURAGESIC - DOSED MCG/HR) 12 MCG/HR   fentaNYL (DURAGESIC - DOSED MCG/HR) 12 MCG/HR   Oxycodone HCl 20 MG TABS   Oxycodone HCl 20 MG TABS   Oxycodone HCl 20 MG TABS   Adalimumab (HUMIRA PEN) 40 MG/0.8ML PNKT   tiZANidine (ZANAFLEX) 4 MG tablet   predniSONE (DELTASONE) 5 MG tablet   tiZANidine (ZANAFLEX) 4 MG tablet   Other Relevant Orders   Lumbar Epidural Injection   Chronic lumbar radicular pain (Location of Secondary source of pain) (Bilateral) (L>R) (L5 Dermatome) (Chronic)   Relevant Orders   NCV with EMG(electromyography)   Lumbar Epidural Injection   Chronic pain - Primary (Chronic)   Relevant Medications   fentaNYL (DURAGESIC - DOSED MCG/HR) 12 MCG/HR   fentaNYL (DURAGESIC - DOSED MCG/HR) 12 MCG/HR   fentaNYL (DURAGESIC - DOSED MCG/HR) 12 MCG/HR   Oxycodone HCl 20 MG TABS   Oxycodone HCl 20 MG TABS   Oxycodone HCl 20 MG TABS   Adalimumab (HUMIRA PEN) 40 MG/0.8ML PNKT   tiZANidine (ZANAFLEX) 4 MG tablet   predniSONE (DELTASONE)  5 MG tablet   tiZANidine (ZANAFLEX) 4 MG tablet   Lumbar facet syndrome (Location of Primary Source of Pain) (Bilateral) (R>L) (Chronic)   Relevant Medications   fentaNYL (DURAGESIC - DOSED MCG/HR) 12 MCG/HR   fentaNYL (DURAGESIC -  DOSED MCG/HR) 12 MCG/HR   fentaNYL (DURAGESIC - DOSED MCG/HR) 12 MCG/HR   Oxycodone HCl 20 MG TABS   Oxycodone HCl 20 MG TABS   Oxycodone HCl 20 MG TABS   Adalimumab (HUMIRA PEN) 40 MG/0.8ML PNKT   tiZANidine (ZANAFLEX) 4 MG tablet   predniSONE (DELTASONE) 5 MG tablet   tiZANidine (ZANAFLEX) 4 MG tablet     Medium   Long term current use of opiate analgesic (Chronic)   Opiate use (120 MME/Day) (Chronic)    Other Visit Diagnoses   None.      Pharmacotherapy (Medications Ordered): Meds ordered this encounter  Medications  . fentaNYL (DURAGESIC - DOSED MCG/HR) 12 MCG/HR    Sig: Place 1 patch (12.5 mcg total) onto the skin every 3 (three) days.    Dispense:  10 patch    Refill:  0    Do not place this medication, or any other prescription from our practice, on "Automatic Refill". Patient may have prescription filled one day early if pharmacy is closed on scheduled refill date. Do not fill until: 06/23/16 To last until: 07/23/16  . fentaNYL (DURAGESIC - DOSED MCG/HR) 12 MCG/HR    Sig: Place 1 patch (12.5 mcg total) onto the skin every 3 (three) days.    Dispense:  10 patch    Refill:  0    Do not place this medication, or any other prescription from our practice, on "Automatic Refill". Patient may have prescription filled one day early if pharmacy is closed on scheduled refill date. Do not fill until: 07/23/16 To last until: 08/22/16  . fentaNYL (DURAGESIC - DOSED MCG/HR) 12 MCG/HR    Sig: Place 1 patch (12.5 mcg total) onto the skin every 3 (three) days.    Dispense:  10 patch    Refill:  0    Do not place this medication, or any other prescription from our practice, on "Automatic Refill". Patient may have prescription filled one day early if  pharmacy is closed on scheduled refill date. Do not fill until: 08/22/16 To last until: 09/21/16  . Oxycodone HCl 20 MG TABS    Sig: Take 1 tablet (20 mg total) by mouth every 8 (eight) hours as needed.    Dispense:  90 tablet    Refill:  0    Do not place this medication, or any other prescription from our practice, on "Automatic Refill". Patient may have prescription filled one day early if pharmacy is closed on scheduled refill date. Do not fill until: 06/23/16 To last until: 07/23/16  . Oxycodone HCl 20 MG TABS    Sig: Take 1 tablet (20 mg total) by mouth every 8 (eight) hours.    Dispense:  90 tablet    Refill:  0    Do not place this medication, or any other prescription from our practice, on "Automatic Refill". Patient may have prescription filled one day early if pharmacy is closed on scheduled refill date. Do not fill until: 07/23/16 To last until: 08/22/16  . Oxycodone HCl 20 MG TABS    Sig: Take 1 tablet (20 mg total) by mouth every 8 (eight) hours as needed.    Dispense:  90 tablet    Refill:  0    Do not place this medication, or any other prescription from our practice, on "Automatic Refill". Patient may have prescription filled one day early if pharmacy is closed on scheduled refill date. Do not fill until: 08/22/16 To last until: 09/21/16    [facility]  Procedure Ordered: Orders Placed This Encounter  Procedures  . Lumbar Epidural Injection  . NCV with EMG(electromyography)    Imaging Ordered: None  Interventional Therapies: Scheduled:  Left sided lumbar epidural steroid injection under fluoroscopic guidance, with or without sedation.    Considering:  Palliative treatments.    PRN Procedures:  1. For the arm pain and numbness, right-sided cervical epidural steroid injection under fluoroscopic guidance and IV sedation.  2. For the lower extremity pain, right-sided L4-5 lumbar epidural steroid injection under fluoroscopic guidance and IV sedation.  3. For the  low back pain, diagnostic bilateral lumbar facet block under fluoroscopic guidance and IV sedation.  4. For the neck pain, diagnostic bilateral cervical facet block under fluoroscopic guidance and IV sedation.    Referral(s) or Consult(s): None at this time.  New Prescriptions   No medications on file    Medications administered during this visit: Mr. Curb had no medications administered during this visit.  Requested PM Follow-up: Return in about 3 months (around 09/10/2016) for Med-Mgmt, (3-Mo), Schedule Procedure.  Future Appointments Date Time Provider Rupert  01/24/2017 10:30 AM [facility]    Primary Care Physician: [name], MD Location: [facility] Note by: Kathlen Brunswick. Dossie Arbour, M.D, DABA, DABAPM, DABPM, DABIPP, FIPP  Pain Score Disclaimer: We use the NRS-11 scale. This is a self-reported, subjective measurement of pain severity with only modest accuracy. It is used primarily to identify changes within a particular patient. It must be understood that outpatient pain scales are significantly less accurate that those used for research, where they can be applied under ideal controlled circumstances with minimal exposure to variables. In reality, the score is likely to be a combination of pain intensity and pain affect, where pain affect describes the degree of emotional arousal or changes in action readiness caused by the sensory experience of pain. Factors such as social and work situation, setting, emotional state, anxiety levels, expectation, and prior pain experience may influence pain perception and show large inter-individual differences that may also be affected by time variables.  Patient instructions provided during this appointment: Patient Instructions   STOP PLAQUINIL FOR 11 DAYS PRIOR TO PROCEDURE DO NOT EAT OR DRINK FOR 8 HOURS BRING A DRIVER TAKE BLOOD PRESSURE MEDICATION THE MORNING OF  PROCEDURE          Epidural Steroid Injection Patient Information  Description: The epidural space surrounds the nerves as they exit the spinal cord.  In some patients, the nerves can be compressed and inflamed by a bulging disc or a tight spinal canal (spinal stenosis).  By injecting steroids into the epidural space, we can bring irritated nerves into direct contact with a potentially helpful medication.  These steroids act directly on the irritated nerves and can reduce swelling and inflammation which often leads to decreased pain.  Epidural steroids may be injected anywhere along the spine and from the neck to the low back depending upon the location of your pain.   After numbing the skin with local anesthetic (like Novocaine), a small needle is passed into the epidural space slowly.  You may experience a sensation of pressure while this is being done.  The entire block usually last less than 10 minutes.  Conditions which may be treated by epidural steroids:   Low back and leg pain  Neck and arm pain  Spinal stenosis  Post-laminectomy syndrome  Herpes zoster (shingles) pain  Pain from compression fractures  Preparation for the injection:  5.  Do not eat any solid food or dairy products within 8 hours of your appointment.  6. You may drink clear liquids up to 3 hours before appointment.  Clear liquids include water, black coffee, juice or soda.  No milk or cream please. 7. You may take your regular medication, including pain medications, with a sip of water before your appointment  Diabetics should hold regular insulin (if taken separately) and take 1/2 normal NPH dos the morning of the procedure.  Carry some sugar containing items with you to your appointment. 8. A driver must accompany you and be prepared to drive you home after your procedure.  9. Bring all your current medications with your. 10. An IV may be inserted and sedation may be given at the discretion of the  physician.   11. A blood pressure cuff, EKG and other monitors will often be applied during the procedure.  Some patients may need to have extra oxygen administered for a short period. 12. You will be asked to provide medical information, including your allergies, prior to the procedure.  We must know immediately if you are taking blood thinners (like Coumadin/Warfarin)  Or if you are allergic to IV iodine contrast (dye). We must know if you could possible be pregnant.  Possible side-effects:  Bleeding from needle site  Infection (rare, may require surgery)  Nerve injury (rare)  Numbness & tingling (temporary)  Difficulty urinating (rare, temporary)  Spinal headache ( a headache worse with upright posture)  Light -headedness (temporary)  Pain at injection site (several days)  Decreased blood pressure (temporary)  Weakness in arm/leg (temporary)  Pressure sensation in back/neck (temporary)  Call if you experience:  Fever/chills associated with headache or increased back/neck pain.  Headache worsened by an upright position.  New onset weakness or numbness of an extremity below the injection site  Hives or difficulty breathing (go to the emergency room)  Inflammation or drainage at the infection site  Severe back/neck pain  Any new symptoms which are concerning to you  Please note:  Although the local anesthetic injected can often make your back or neck feel good for several hours after the injection, the pain will likely return.  It takes 3-7 days for steroids to work in the epidural space.  You may not notice any pain relief for at least that one week.  If effective, we will often do a series of three injections spaced 3-6 weeks apart to maximally decrease your pain.  After the initial series, we generally will wait several months before considering a repeat injection of the same type.  If you have any questions, please call [phone] [facility]

## 2016-05-30 NOTE — Progress Notes (Signed)
Safety precautions to be maintained throughout the outpatient stay will include: orient to surroundings, keep bed in low position, maintain call bell within reach at all times, provide assistance with transfer out of bed and ambulation. Oxycodone pill count # 73/90  Filled 05-24-16 Fentanyl patch 12 mcg  #7/10   Filled 05-24-16

## 2016-06-13 ENCOUNTER — Ambulatory Visit: Payer: PPO | Admitting: Pain Medicine

## 2016-06-14 DIAGNOSIS — G4733 Obstructive sleep apnea (adult) (pediatric): Secondary | ICD-10-CM | POA: Diagnosis not present

## 2016-06-20 DIAGNOSIS — R202 Paresthesia of skin: Secondary | ICD-10-CM | POA: Diagnosis not present

## 2016-06-20 DIAGNOSIS — R200 Anesthesia of skin: Secondary | ICD-10-CM | POA: Diagnosis not present

## 2016-06-26 ENCOUNTER — Ambulatory Visit: Payer: PPO | Attending: Pain Medicine | Admitting: Pain Medicine

## 2016-06-26 ENCOUNTER — Encounter: Payer: Self-pay | Admitting: Pain Medicine

## 2016-06-26 VITALS — BP 111/75 | HR 67 | Temp 97.5°F | Resp 12 | Ht 71.0 in | Wt 260.0 lb

## 2016-06-26 DIAGNOSIS — I10 Essential (primary) hypertension: Secondary | ICD-10-CM | POA: Diagnosis not present

## 2016-06-26 DIAGNOSIS — G4730 Sleep apnea, unspecified: Secondary | ICD-10-CM | POA: Insufficient documentation

## 2016-06-26 DIAGNOSIS — M25552 Pain in left hip: Secondary | ICD-10-CM | POA: Diagnosis not present

## 2016-06-26 DIAGNOSIS — M25562 Pain in left knee: Secondary | ICD-10-CM | POA: Diagnosis not present

## 2016-06-26 DIAGNOSIS — M4726 Other spondylosis with radiculopathy, lumbar region: Secondary | ICD-10-CM | POA: Diagnosis not present

## 2016-06-26 DIAGNOSIS — M533 Sacrococcygeal disorders, not elsewhere classified: Secondary | ICD-10-CM | POA: Diagnosis not present

## 2016-06-26 DIAGNOSIS — M545 Low back pain: Secondary | ICD-10-CM

## 2016-06-26 DIAGNOSIS — E119 Type 2 diabetes mellitus without complications: Secondary | ICD-10-CM | POA: Diagnosis not present

## 2016-06-26 DIAGNOSIS — E038 Other specified hypothyroidism: Secondary | ICD-10-CM | POA: Insufficient documentation

## 2016-06-26 DIAGNOSIS — K649 Unspecified hemorrhoids: Secondary | ICD-10-CM | POA: Insufficient documentation

## 2016-06-26 DIAGNOSIS — M25561 Pain in right knee: Secondary | ICD-10-CM | POA: Diagnosis not present

## 2016-06-26 DIAGNOSIS — I491 Atrial premature depolarization: Secondary | ICD-10-CM | POA: Diagnosis not present

## 2016-06-26 DIAGNOSIS — K219 Gastro-esophageal reflux disease without esophagitis: Secondary | ICD-10-CM | POA: Diagnosis not present

## 2016-06-26 DIAGNOSIS — R079 Chest pain, unspecified: Secondary | ICD-10-CM | POA: Diagnosis not present

## 2016-06-26 DIAGNOSIS — M542 Cervicalgia: Secondary | ICD-10-CM | POA: Insufficient documentation

## 2016-06-26 DIAGNOSIS — L4050 Arthropathic psoriasis, unspecified: Secondary | ICD-10-CM | POA: Diagnosis not present

## 2016-06-26 DIAGNOSIS — M4722 Other spondylosis with radiculopathy, cervical region: Secondary | ICD-10-CM | POA: Diagnosis not present

## 2016-06-26 DIAGNOSIS — J45909 Unspecified asthma, uncomplicated: Secondary | ICD-10-CM | POA: Insufficient documentation

## 2016-06-26 DIAGNOSIS — M5416 Radiculopathy, lumbar region: Secondary | ICD-10-CM | POA: Diagnosis not present

## 2016-06-26 DIAGNOSIS — I471 Supraventricular tachycardia: Secondary | ICD-10-CM | POA: Insufficient documentation

## 2016-06-26 DIAGNOSIS — K259 Gastric ulcer, unspecified as acute or chronic, without hemorrhage or perforation: Secondary | ICD-10-CM | POA: Insufficient documentation

## 2016-06-26 DIAGNOSIS — M8580 Other specified disorders of bone density and structure, unspecified site: Secondary | ICD-10-CM | POA: Insufficient documentation

## 2016-06-26 DIAGNOSIS — E039 Hypothyroidism, unspecified: Secondary | ICD-10-CM | POA: Diagnosis not present

## 2016-06-26 DIAGNOSIS — K759 Inflammatory liver disease, unspecified: Secondary | ICD-10-CM | POA: Diagnosis not present

## 2016-06-26 DIAGNOSIS — G9619 Other disorders of meninges, not elsewhere classified: Secondary | ICD-10-CM | POA: Diagnosis not present

## 2016-06-26 DIAGNOSIS — I4891 Unspecified atrial fibrillation: Secondary | ICD-10-CM | POA: Insufficient documentation

## 2016-06-26 DIAGNOSIS — G8929 Other chronic pain: Secondary | ICD-10-CM | POA: Insufficient documentation

## 2016-06-26 MED ORDER — IOPAMIDOL (ISOVUE-M 200) INJECTION 41%: 10.0000 mL | Freq: Once | INTRAMUSCULAR | Status: DC

## 2016-06-26 MED ORDER — LIDOCAINE HCL (PF) 1 % IJ SOLN
10.0000 mL | Freq: Once | INTRAMUSCULAR | Status: DC
  Filled 2016-06-26: qty 10

## 2016-06-26 MED ORDER — ROPIVACAINE HCL 2 MG/ML IJ SOLN
INTRAMUSCULAR | Status: AC
  Administered 2016-06-26: 15:00:00
  Filled 2016-06-26: qty 10

## 2016-06-26 MED ORDER — ROPIVACAINE HCL 2 MG/ML IJ SOLN: 2.0000 mL | Freq: Once | INTRAMUSCULAR | Status: DC

## 2016-06-26 MED ORDER — TRIAMCINOLONE ACETONIDE 40 MG/ML IJ SUSP
INTRAMUSCULAR | Status: AC
  Administered 2016-06-26: 15:00:00
  Filled 2016-06-26: qty 1

## 2016-06-26 MED ORDER — FENTANYL CITRATE (PF) 100 MCG/2ML IJ SOLN
INTRAMUSCULAR | Status: AC
  Administered 2016-06-26: 100 ug
  Filled 2016-06-26: qty 2

## 2016-06-26 MED ORDER — LIDOCAINE HCL (PF) 1 % IJ SOLN
INTRAMUSCULAR | Status: AC
  Administered 2016-06-26: 15:00:00
  Filled 2016-06-26: qty 5

## 2016-06-26 MED ORDER — MIDAZOLAM HCL 5 MG/5ML IJ SOLN
INTRAMUSCULAR | Status: AC
  Administered 2016-06-26: 4 mg
  Filled 2016-06-26: qty 5

## 2016-06-26 MED ORDER — TRIAMCINOLONE ACETONIDE 40 MG/ML IJ SUSP: 40.0000 mg | Freq: Once | INTRAMUSCULAR | Status: DC

## 2016-06-26 MED ORDER — IOPAMIDOL (ISOVUE-M 200) INJECTION 41%
INTRAMUSCULAR | Status: AC
  Administered 2016-06-26: 14:00:00
  Filled 2016-06-26: qty 10

## 2016-06-26 MED ORDER — LACTATED RINGERS IV SOLN
1000.0000 mL | Freq: Once | INTRAVENOUS | Status: AC
  Administered 2016-06-26: 1000 mL via INTRAVENOUS

## 2016-06-26 MED ORDER — SODIUM CHLORIDE 0.9% FLUSH: 2.0000 mL | Freq: Once | INTRAVENOUS | Status: DC

## 2016-06-26 MED ORDER — SODIUM CHLORIDE 0.9 % IJ SOLN
INTRAMUSCULAR | Status: AC
  Administered 2016-06-26: 15:00:00
  Filled 2016-06-26: qty 20

## 2016-06-26 MED ORDER — MIDAZOLAM HCL 5 MG/5ML IJ SOLN: 1.0000 mg | INTRAMUSCULAR | Status: DC | PRN

## 2016-06-26 MED ORDER — FENTANYL CITRATE (PF) 100 MCG/2ML IJ SOLN: 25.0000 ug | INTRAMUSCULAR | Status: DC | PRN

## 2016-06-26 NOTE — Patient Instructions (Addendum)
Epidural Steroid Injection Patient Information  Description: The epidural space surrounds the nerves as they exit the spinal cord.  In some patients, the nerves can be compressed and inflamed by a bulging disc or a tight spinal canal (spinal stenosis).  By injecting steroids into the epidural space, we can bring irritated nerves into direct contact with a potentially helpful medication.  These steroids act directly on the irritated nerves and can reduce swelling and inflammation which often leads to decreased pain.  Epidural steroids may be injected anywhere along the spine and from the neck to the low back depending upon the location of your pain.   After numbing the skin with local anesthetic (like Novocaine), a small needle is passed into the epidural space slowly.  You may experience a sensation of pressure while this is being done.  The entire block usually last less than 10 minutes.  Conditions which may be treated by epidural steroids:   Low back and leg pain  Neck and arm pain  Spinal stenosis  Post-laminectomy syndrome  Herpes zoster (shingles) pain  Pain from compression fractures  Preparation for the injection:  1. Do not eat any solid food or dairy products within 8 hours of your appointment.  2. You may drink clear liquids up to 3 hours before appointment.  Clear liquids include water, black coffee, juice or soda.  No milk or cream please. 3. You may take your regular medication, including pain medications, with a sip of water before your appointment  Diabetics should hold regular insulin (if taken separately) and take 1/2 normal NPH dos the morning of the procedure.  Carry some sugar containing items with you to your appointment. 4. A driver must accompany you and be prepared to drive you home after your procedure.  5. Bring all your current medications with your. 6. An IV may be inserted and sedation may be given at the discretion of the physician.   7. A blood pressure  cuff, EKG and other monitors will often be applied during the procedure.  Some patients may need to have extra oxygen administered for a short period. 8. You will be asked to provide medical information, including your allergies, prior to the procedure.  We must know immediately if you are taking blood thinners (like Coumadin/Warfarin)  Or if you are allergic to IV iodine contrast (dye). We must know if you could possible be pregnant.  Possible side-effects:  Bleeding from needle site  Infection (rare, may require surgery)  Nerve injury (rare)  Numbness & tingling (temporary)  Difficulty urinating (rare, temporary)  Spinal headache ( a headache worse with upright posture)  Light -headedness (temporary)  Pain at injection site (several days)  Decreased blood pressure (temporary)  Weakness in arm/leg (temporary)  Pressure sensation in back/neck (temporary)  Call if you experience:  Fever/chills associated with headache or increased back/neck pain.  Headache worsened by an upright position.  New onset weakness or numbness of an extremity below the injection site  Hives or difficulty breathing (go to the emergency room)  Inflammation or drainage at the infection site  Severe back/neck pain  Any new symptoms which are concerning to you  Please note:  Although the local anesthetic injected can often make your back or neck feel good for several hours after the injection, the pain will likely return.  It takes 3-7 days for steroids to work in the epidural space.  You may not notice any pain relief for at least that one week.    If effective, we will often do a series of three injections spaced 3-6 weeks apart to maximally decrease your pain.  After the initial series, we generally will wait several months before considering a repeat injection of the same type.  If you have any questions, please call [phone] [facility]  Pain Management  Discharge Instructions  General Discharge Instructions :  If you need to reach your doctor call: Monday-Friday 8:00 am - 4:00 pm at [phone] or toll free [phone].  After clinic hours [phone] to have operator reach doctor.  Bring all of your medication bottles to all your appointments in the pain clinic.  To cancel or reschedule your appointment with Pain Management please remember to call 24 hours in advance to avoid a fee.  Refer to the educational materials which you have been given on: General Risks, I had my Procedure. Discharge Instructions, Post Sedation.  Post Procedure Instructions:  The drugs you were given will stay in your system until tomorrow, so for the next 24 hours you should not drive, make any legal decisions or drink any alcoholic beverages.  You may eat anything you prefer, but it is better to start with liquids then soups and crackers, and gradually work up to solid foods.  Please notify your doctor immediately if you have any unusual bleeding, trouble breathing or pain that is not related to your normal pain.  Depending on the type of procedure that was done, some parts of your body may feel week and/or numb.  This usually clears up by tonight or the next day.  Walk with the use of an assistive device or accompanied by an adult for the 24 hours.  You may use ice on the affected area for the first 24 hours.  Put ice in a Ziploc bag and cover with a towel and place against area 15 minutes on 15 minutes off.  You may switch to heat after 24 hours.  IMPORTANT: Please fill out the post procedure pain diary and bring it with you at your next appointment.

## 2016-06-26 NOTE — Progress Notes (Signed)
Safety precautions to be maintained throughout the outpatient stay will include: orient to surroundings, keep bed in low position, maintain call bell within reach at all times, provide assistance with transfer out of bed and ambulation.  

## 2016-06-26 NOTE — Progress Notes (Signed)
Patient's Name: James [name]  Patient type: Established  MRN: [id number]  Service setting: Ambulatory outpatient  DOB: [birth year]  Location: [facility]  DOS: 06/26/2016  Primary Care Physician: [name], MD  Note by: Kathlen Brunswick. Dossie Arbour, M.D, DABA, DABAPM, DABPM, Milagros Evener, FIPP  Referring Physician: Milinda Pointer, MD  Specialty: Board-Certified Interventional Pain Management  Last Visit to Pain Management: 05/30/2016   Primary Reason(s) for Visit: Interventional Pain Management Treatment. CC: Back Pain (lower); Knee Pain (both); and Hip Pain (both)  Chronic radicular lumbar pain [M54.16, G89.29]   Procedure:  Anesthesia, Analgesia, Anxiolysis:  Type: Therapeutic Inter-Laminar Epidural Steroid Injection Region: Lumbar Level: L4-5 Level. Laterality: Right-Sided Paramedial  Indications: 1. Chronic lumbar radicular pain (Location of Secondary source of pain) (Bilateral) (R>L) (L5 Dermatome)   2. Chronic low back pain (Location of Primary Source of Pain) (Bilateral) (R>L)     Pre-procedure Pain Score: 6/10 Reported level of pain is compatible with clinical observations Post-procedure Pain Score: 4   Type: Moderate (Conscious) Sedation & Local Anesthesia Local Anesthetic: Lidocaine 1% Route: Intravenous (IV) IV Access: Secured Sedation: Meaningful verbal contact was maintained at all times during the procedure  Indication(s): Analgesia & Anxiolysis   Pre-Procedure Assessment:  James [name] is a 60 y.o. year old, male patient, seen today for interventional treatment. He has Airway hyperreactivity; Atrial fibrillation ([place]); Narrowing of intervertebral disc space; Abnormal echocardiogram; Gastric ulcer; Gastric catarrh; Acid reflux; Awareness of heartbeats; Hemorrhoid; Hepatitis; BP (high blood pressure); Lactose intolerance; Osteopenia; APC (atrial premature contractions); Apnea, sleep; Supraventricular tachycardia ([place]); Chronic pain; Long term  current use of opiate analgesic; Long term prescription opiate use; Opiate use (120 MME/Day); Opiate dependence ([place]); Encounter for therapeutic drug level monitoring; Chronic low back pain (Location of Primary Source of Pain) (Bilateral) (R>L); Lumbar spondylosis; Chronic neck pain (Right); Cervical spondylosis (C7 intravertebral body cyst); Chronic cervical radicular pain (Right); Chronic lumbar radicular pain (Location of Secondary source of pain) (Bilateral) (L>R) (L5 Dermatome); Osteoarthritis; Chronic hip pain; Chronic knee pain (Bilateral) (R>L); Complex regional pain syndrome type II of upper extremity (Right); Chronic upper extremity pain (Right); 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[place]); Myofascial pain syndrome, cervical (rhomboid muscles) (intermittent); Lumbar facet syndrome (Location of Primary Source of Pain) (Bilateral) (R>L); Chronic knee pain (Left); Failed back surgical syndrome; Epidural fibrosis; Musculoskeletal pain; Neurogenic pain; Neuropathic pain; Chronic sacroiliac joint pain (Bilateral) (R>L); Psoriatic arthritis ([place]); Controlled type 2 diabetes mellitus without complication ([place]); Chest pain; Acquired hypothyroidism; Chronic lower extremity pain (Location of Secondary source of pain) (Bilateral) (L>R); Psoriasis with arthropathy ([place]); and Hypothyroidism on his problem list.. His primarily concern today is the Back Pain (lower); Knee Pain (both); and Hip Pain (both)   Pain Type: Chronic pain Pain Location: Back Pain Orientation: Lower Pain Descriptors / Indicators: Aching, Constant, Sharp, Radiating Pain Frequency: Constant  Date of Last Visit: 05/30/16 Service Provided on Last Visit: Med Refill  Coagulation Parameters No results found for: INR, LABPROT, APTT, PLT  Verification of the correct person, correct site (including marking of site), and correct procedure were performed and confirmed by the patient.  Consent: Secured. Under the  influence of no sedatives a written informed consent was obtained, after having provided information on the risks and possible complications. To fulfill our ethical and legal obligations, as recommended by the American Medical Association's Code of Ethics, we have provided information to the patient about our clinical impression; the nature and purpose of the treatment or procedure; the risks, benefits,  and possible complications of the intervention; alternatives; the risk(s) and benefit(s) of the alternative treatment(s) or procedure(s); and the risk(s) and benefit(s) of doing nothing. The patient was provided information about the risks and possible complications associated with the procedure. These include, but are not limited to, failure to achieve desired goals, infection, bleeding, organ or nerve damage, allergic reactions, paralysis, and death. In the case of spinal procedures these may include, but are not limited to, failure to achieve desired goals, infection, bleeding, organ or nerve damage, allergic reactions, paralysis, and death. In addition, the patient was informed that Medicine is not an exact science; therefore, there is also the possibility of unforeseen risks and possible complications that may result in a catastrophic outcome. The patient indicated having understood very clearly. We have given the patient no guarantees and we have made no promises. Enough time was given to the patient to ask questions, all of which were answered to the patient's satisfaction.  Consent Attestation: I, the ordering provider, attest that I have discussed with the patient the benefits, risks, side-effects, alternatives, likelihood of achieving goals, and potential problems during recovery for the procedure that I have provided informed consent.  Pre-Procedure Preparation: Safety Precautions: Allergies reviewed. Appropriate site, procedure, and patient were confirmed by following the Joint Commission's  Universal Protocol (UP.01.01.01), in the form of a "Time Out". The patient was asked to confirm marked site and procedure, before commencing. The patient was asked about blood thinners, or active infections, both of which were denied. Patient was assessed for positional comfort and all pressure points were checked before starting procedure. Allergies: He is allergic to penicillins; lactose; nsaids; prednisone; procaine; sulfa antibiotics; sulfasalazine; talwin [pentazocine]; and codeine.. Infection Control Precautions: Sterile technique used. Standard Universal Precautions were taken as recommended by the Department of [facility] for Disease Control and Prevention (CDC). Standard pre-surgical skin prep was conducted. Respiratory hygiene and cough etiquette was practiced. Hand hygiene observed. Safe injection practices and needle disposal techniques followed. SDV (single dose vial) medications used. Medications properly checked for expiration dates and contaminants. Personal protective equipment (PPE) used: Surgical mask. Sterile Radiation-resistant gloves. Monitoring:  As per clinic protocol. Vitals:   06/26/16 1445 06/26/16 1455 06/26/16 1505 06/26/16 1515  BP: 102/70 95/63 117/70 111/75  Pulse: 70 65 73 67  Resp: _0 Temp: 97.5 F (36.4 C)     TempSrc: Temporal     SpO2: 96% 94% 96% 96%  Weight:      Height:      Calculated BMI: Body mass index is 36.26 kg/m.  Description of Procedure Process:  Time-out: "Time-out" completed before starting procedure, as per protocol. Position: Prone Target Area: For Epidural Steroid injections, the target area is the  interlaminar space, initially targeting the lower border of the superior vertebral body lamina. Approach: Posterior approach. Area Prepped: Entire Posterior Lumbosacral Region Prepping solution: ChloraPrep (2% chlorhexidine gluconate and 70% isopropyl alcohol) Safety Precautions: Aspiration looking for blood return was  conducted prior to all injections. At no point did we inject any substances, as a needle was being advanced. No attempts were made at seeking any paresthesias. Safe injection practices and needle disposal techniques used. Medications properly checked for expiration dates. SDV (single dose vial) medications used.         Description of the Procedure: Protocol guidelines were followed. The patient was placed in position over the fluoroscopy table. The target area was identified and the area prepped in the usual manner. Skin desensitized  using vapocoolant spray. Skin & deeper tissues infiltrated with local anesthetic. Appropriate amount of time allowed to pass for local anesthetics to take effect. The procedure needle was introduced through the skin, ipsilateral to the reported pain, and advanced to the target area. Bone was contacted and the needle walked caudad, until the lamina was cleared. The epidural space was identified using "loss-of-resistance technique" with 2-3 ml of PF-NaCl (0.9% NSS), in a 5cc LOR glass syringe. Proper needle placement secured. Negative aspiration confirmed. Solution injected in intermittent fashion, asking for systemic symptoms every 0.5cc of injectate. The needles were then removed and the area cleansed, making sure to leave some of the prepping solution back to take advantage of its long term bactericidal properties. EBL: None Materials & Medications Used:  Needle(s) Used: 20g - 10cm, Tuohy-style epidural needle Medication(s): Please see chart orders for medication and dosing details.  Imaging Guidance:  Type of Imaging Technique: Fluoroscopy Guidance (Spinal) Indication(s): Assistance in needle guidance and placement for procedures requiring needle placement in or near specific anatomical locations not easily accessible without such assistance. Exposure Time: Please see nurses notes. Contrast: Before injecting any contrast, we confirmed that the patient did not have an  allergy to iodine, shellfish, or radiological contrast. Once satisfactory needle placement was completed at the desired level, radiological contrast was injected. Injection was conducted under continuous fluoroscopic guidance. Injection of contrast accomplished without complications. See chart for type and volume of contrast used. Fluoroscopic Guidance: I was personally present in the fluoroscopy suite, where the patient was placed in position for the procedure, over the fluoroscopy-compatible table. Fluoroscopy was manipulated, using "Tunnel Vision Technique", to obtain the best possible view of the target area, on the affected side. Parallax error was corrected before commencing the procedure. A "direction-depth-direction" technique was used to introduce the needle under continuous pulsed fluoroscopic guidance. Once the target was reached, antero-posterior, oblique, and lateral fluoroscopic projection views were taken to confirm needle placement in all planes. Permanently recorded images stored by scanning into EMR. Interpretation: Intraoperative imaging interpretation by performing Physician. Adequate needle placement confirmed in AP & Oblique Views. Appropriate spread of contrast to desired area. No evidence of afferent or efferent intravascular uptake. No intrathecal or subarachnoid spread observed. Permanent images scanned into the patient's record.  Antibiotic Prophylaxis:  Indication(s): No indications identified. Type:  Antibiotics Given (last 72 hours)    None       Post-operative Assessment:   Complications: No immediate post-treatment complications were observed. Relevant Post-operative Information:  Disposition: Return to clinic for follow-up evaluation. The patient tolerated the entire procedure well. A repeat set of vitals were taken after the procedure and the patient was kept under observation following institutional policy, for this procedure. Post-procedural neurological assessment  was performed, showing return to baseline, prior to discharge. The patient was discharged home, once institutional criteria were met. The patient was provided with post-procedure discharge instructions, including a section on how to identify potential problems. Should any problems arise concerning this procedure, the patient was given instructions to immediately contact us, at any time, without hesitation. In any case, we plan to contact the patient by telephone for a follow-up status report regarding this interventional procedure. Comments:  No additional relevant information.  Plan of Care   Problem List Items Addressed This Visit      High   Chronic low back pain (Location of Primary Source of Pain) (Bilateral) (R>L) (Chronic)   Relevant Medications   fentaNYL (SUBLIMAZE) 100 MCG/2ML injection (Completed)  triamcinolone acetonide (KENALOG-40) 40 MG/ML injection (Completed)   fentaNYL (SUBLIMAZE) injection 25-50 mcg   triamcinolone acetonide (KENALOG-40) injection 40 mg   predniSONE (DELTASONE) 5 MG tablet   Chronic lumbar radicular pain (Location of Secondary source of pain) (Bilateral) (L>R) (L5 Dermatome) - Primary (Chronic)   Relevant Medications   fentaNYL (SUBLIMAZE) injection 25-50 mcg   lactated ringers infusion 1,000 mL (Completed)   midazolam (VERSED) 5 MG/5ML injection 1-2 mg   iopamidol (ISOVUE-M) 41 % intrathecal injection 10 mL   triamcinolone acetonide (KENALOG-40) injection 40 mg   lidocaine (PF) (XYLOCAINE) 1 % injection 10 mL   sodium chloride flush (NS) 0.9 % injection 2 mL   ropivacaine (PF) 2 mg/ml (0.2%) (NAROPIN) epidural 2 mL    Other Visit Diagnoses   None.     Requested PM Follow-up: Return in about 2 weeks (around 07/10/2016) for Post-Procedure evaluation.  Future Appointments Date Time Provider [place]  07/12/2016 8:20 AM Milinda Pointer, MD [facility] None  09/10/2016 8:00 AM Milinda Pointer, MD [facility] None  01/24/2017 10:30 AM [facility]    Primary Care Physician: [name], MD Location: [facility] Note by: Kathlen Brunswick. Dossie Arbour, M.D, DABA, DABAPM, DABPM, DABIPP, FIPP  Disclaimer:  Medicine is not an exact science. The only guarantee in medicine is that nothing is guaranteed. It is important to note that the decision to proceed with this intervention was based on the information collected from the patient. The Data and conclusions were drawn from the patient's questionnaire, the interview, and the physical examination. Because the information was provided in large part by the patient, it cannot be guaranteed that it has not been purposely or unconsciously manipulated. Every effort has been made to obtain as much relevant data as possible for this evaluation. It is important to note that the conclusions that lead to this procedure are derived in large part from the available data. Always take into account that the treatment will also be dependent on availability of resources and existing treatment guidelines, considered by other Pain Management Practitioners as being common knowledge and practice, at the time of the intervention. For Medico-Legal purposes, it is also important to point out that variation in procedural techniques and pharmacological choices are the acceptable norm. The indications, contraindications, technique, and results of the above procedure should only be interpreted and judged by a Board-Certified Interventional Pain Specialist with extensive familiarity and expertise in the same exact procedure and technique. Attempts at providing opinions without similar or greater experience and expertise than that of the treating physician will be considered as inappropriate and unethical, and shall result in a formal complaint to the state medical board and applicable specialty societies.

## 2016-06-27 ENCOUNTER — Telehealth: Payer: Self-pay | Admitting: *Deleted

## 2016-06-27 DIAGNOSIS — M1990 Unspecified osteoarthritis, unspecified site: Secondary | ICD-10-CM | POA: Diagnosis not present

## 2016-06-27 DIAGNOSIS — Z79899 Other long term (current) drug therapy: Secondary | ICD-10-CM | POA: Diagnosis not present

## 2016-06-27 NOTE — Telephone Encounter (Signed)
No problems post procedure. 

## 2016-07-03 DIAGNOSIS — R200 Anesthesia of skin: Secondary | ICD-10-CM | POA: Insufficient documentation

## 2016-07-03 DIAGNOSIS — R202 Paresthesia of skin: Secondary | ICD-10-CM

## 2016-07-03 DIAGNOSIS — M150 Primary generalized (osteo)arthritis: Secondary | ICD-10-CM | POA: Diagnosis not present

## 2016-07-03 DIAGNOSIS — M1990 Unspecified osteoarthritis, unspecified site: Secondary | ICD-10-CM | POA: Diagnosis not present

## 2016-07-03 DIAGNOSIS — M791 Myalgia: Secondary | ICD-10-CM | POA: Diagnosis not present

## 2016-07-03 DIAGNOSIS — L409 Psoriasis, unspecified: Secondary | ICD-10-CM | POA: Diagnosis not present

## 2016-07-12 ENCOUNTER — Encounter: Payer: Self-pay | Admitting: Pain Medicine

## 2016-07-12 ENCOUNTER — Ambulatory Visit: Payer: PPO | Attending: Pain Medicine | Admitting: Pain Medicine

## 2016-07-12 VITALS — BP 123/79 | HR 74 | Temp 98.1°F | Resp 16 | Ht 71.0 in | Wt 265.0 lb

## 2016-07-12 DIAGNOSIS — M542 Cervicalgia: Secondary | ICD-10-CM | POA: Diagnosis not present

## 2016-07-12 DIAGNOSIS — I4891 Unspecified atrial fibrillation: Secondary | ICD-10-CM | POA: Insufficient documentation

## 2016-07-12 DIAGNOSIS — E739 Lactose intolerance, unspecified: Secondary | ICD-10-CM | POA: Insufficient documentation

## 2016-07-12 DIAGNOSIS — R200 Anesthesia of skin: Secondary | ICD-10-CM | POA: Insufficient documentation

## 2016-07-12 DIAGNOSIS — G4730 Sleep apnea, unspecified: Secondary | ICD-10-CM | POA: Diagnosis not present

## 2016-07-12 DIAGNOSIS — M79605 Pain in left leg: Secondary | ICD-10-CM | POA: Insufficient documentation

## 2016-07-12 DIAGNOSIS — Z79891 Long term (current) use of opiate analgesic: Secondary | ICD-10-CM | POA: Diagnosis not present

## 2016-07-12 DIAGNOSIS — I491 Atrial premature depolarization: Secondary | ICD-10-CM | POA: Diagnosis not present

## 2016-07-12 DIAGNOSIS — M4722 Other spondylosis with radiculopathy, cervical region: Secondary | ICD-10-CM | POA: Diagnosis not present

## 2016-07-12 DIAGNOSIS — G5641 Causalgia of right upper limb: Secondary | ICD-10-CM | POA: Diagnosis not present

## 2016-07-12 DIAGNOSIS — E119 Type 2 diabetes mellitus without complications: Secondary | ICD-10-CM | POA: Insufficient documentation

## 2016-07-12 DIAGNOSIS — M8580 Other specified disorders of bone density and structure, unspecified site: Secondary | ICD-10-CM | POA: Insufficient documentation

## 2016-07-12 DIAGNOSIS — G8929 Other chronic pain: Secondary | ICD-10-CM | POA: Diagnosis not present

## 2016-07-12 DIAGNOSIS — K759 Inflammatory liver disease, unspecified: Secondary | ICD-10-CM | POA: Insufficient documentation

## 2016-07-12 DIAGNOSIS — M25559 Pain in unspecified hip: Secondary | ICD-10-CM | POA: Diagnosis not present

## 2016-07-12 DIAGNOSIS — E038 Other specified hypothyroidism: Secondary | ICD-10-CM | POA: Insufficient documentation

## 2016-07-12 DIAGNOSIS — M79604 Pain in right leg: Secondary | ICD-10-CM | POA: Insufficient documentation

## 2016-07-12 DIAGNOSIS — M961 Postlaminectomy syndrome, not elsewhere classified: Secondary | ICD-10-CM

## 2016-07-12 DIAGNOSIS — M79606 Pain in leg, unspecified: Secondary | ICD-10-CM | POA: Insufficient documentation

## 2016-07-12 DIAGNOSIS — M539 Dorsopathy, unspecified: Secondary | ICD-10-CM

## 2016-07-12 DIAGNOSIS — M25562 Pain in left knee: Secondary | ICD-10-CM | POA: Diagnosis not present

## 2016-07-12 DIAGNOSIS — M549 Dorsalgia, unspecified: Secondary | ICD-10-CM | POA: Diagnosis present

## 2016-07-12 DIAGNOSIS — M545 Low back pain: Secondary | ICD-10-CM | POA: Insufficient documentation

## 2016-07-12 DIAGNOSIS — K649 Unspecified hemorrhoids: Secondary | ICD-10-CM | POA: Insufficient documentation

## 2016-07-12 DIAGNOSIS — M5416 Radiculopathy, lumbar region: Secondary | ICD-10-CM | POA: Insufficient documentation

## 2016-07-12 DIAGNOSIS — K259 Gastric ulcer, unspecified as acute or chronic, without hemorrhage or perforation: Secondary | ICD-10-CM | POA: Diagnosis not present

## 2016-07-12 DIAGNOSIS — M25561 Pain in right knee: Secondary | ICD-10-CM | POA: Insufficient documentation

## 2016-07-12 DIAGNOSIS — M47816 Spondylosis without myelopathy or radiculopathy, lumbar region: Secondary | ICD-10-CM

## 2016-07-12 DIAGNOSIS — M791 Myalgia: Secondary | ICD-10-CM | POA: Diagnosis not present

## 2016-07-12 DIAGNOSIS — Z87891 Personal history of nicotine dependence: Secondary | ICD-10-CM | POA: Insufficient documentation

## 2016-07-12 DIAGNOSIS — M5417 Radiculopathy, lumbosacral region: Secondary | ICD-10-CM | POA: Insufficient documentation

## 2016-07-12 DIAGNOSIS — M4726 Other spondylosis with radiculopathy, lumbar region: Secondary | ICD-10-CM | POA: Diagnosis not present

## 2016-07-12 DIAGNOSIS — G9619 Other disorders of meninges, not elsewhere classified: Secondary | ICD-10-CM | POA: Insufficient documentation

## 2016-07-12 DIAGNOSIS — I471 Supraventricular tachycardia: Secondary | ICD-10-CM | POA: Insufficient documentation

## 2016-07-12 DIAGNOSIS — R94130 Abnormal response to nerve stimulation, unspecified: Secondary | ICD-10-CM

## 2016-07-12 DIAGNOSIS — K219 Gastro-esophageal reflux disease without esophagitis: Secondary | ICD-10-CM | POA: Insufficient documentation

## 2016-07-12 DIAGNOSIS — R918 Other nonspecific abnormal finding of lung field: Secondary | ICD-10-CM | POA: Insufficient documentation

## 2016-07-12 DIAGNOSIS — M533 Sacrococcygeal disorders, not elsewhere classified: Secondary | ICD-10-CM | POA: Insufficient documentation

## 2016-07-12 DIAGNOSIS — L4050 Arthropathic psoriasis, unspecified: Secondary | ICD-10-CM | POA: Insufficient documentation

## 2016-07-12 NOTE — Patient Instructions (Signed)

## 2016-07-12 NOTE — Progress Notes (Signed)
Patient here for f/up after procedure visit.  Patient c/o pain at site of tumor L3, L4.  States that the procedure really aggravated this area, but pain that was procedure was being peformed for is better, lower back and into legs.  Dr Melrose Nakayama did a nerve conduction test approx 1 week prior to procedure and was supposed to send results to our office.   Safety precautions to be maintained throughout the outpatient stay will include: orient to surroundings, keep bed in low position, maintain call bell within reach at all times, provide assistance with transfer out of bed and ambulation.

## 2016-07-12 NOTE — Progress Notes (Signed)
Dorsalis pedis and posterior tibialis pulses audible bilaterally  Via doppler and cap refill of digits < 3 seconds.

## 2016-07-12 NOTE — Progress Notes (Signed)
Patient's Name: James [name]  MRN: [id number]  Referring Provider: [name], MD  DOB: 02/14/1956  PCP: [name], MD  DOS: 07/12/2016  Note by: Kathlen Brunswick. Dossie Arbour, MD  Service setting: Ambulatory outpatient  Specialty: Interventional Pain Management  Location: [facility] (AMB) Pain Management Facility    Patient type: Established   Primary Reason(s) for Visit: Encounter for post-procedure evaluation of chronic illness with mild to moderate exacerbation CC: Back Pain; Hip Pain; and Leg Pain   HPI  [name] is a 60 y.o. year old, male patient, who returns today as an established patient. He has Airway hyperreactivity; Atrial fibrillation ([place]); Narrowing of intervertebral disc space; Abnormal echocardiogram; Gastric ulcer; Gastric catarrh; Acid reflux; Awareness of heartbeats; Hemorrhoid; Hepatitis; BP (high blood pressure); Lactose intolerance; Osteopenia; APC (atrial premature contractions); Apnea, sleep; Supraventricular tachycardia ([place]); Chronic pain; Long term current use of opiate analgesic; Long term prescription opiate use; Opiate use (120 MME/Day); Opiate dependence ([place]); Encounter for therapeutic drug level monitoring; Chronic low back pain (Location of Primary Source of Pain) (Bilateral) (R>L); Lumbar spondylosis; Chronic neck pain (Right); Cervical spondylosis (C7 intravertebral body cyst); Chronic cervical radicular pain (Right); Chronic lumbar radicular pain (Location of Secondary source of pain) (Bilateral) (L>R) (L5 Dermatome); Osteoarthritis; Chronic hip pain; Chronic knee pain (Bilateral) (R>L); Complex regional pain syndrome type II of upper extremity (Right); Chronic upper extremity pain (Right); 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 Raquel Sarna); Epidural fibrosis; Musculoskeletal pain; Neurogenic pain; Neuropathic pain; Chronic sacroiliac joint pain (Bilateral) (R>L); Psoriatic arthritis ([place]); Controlled type 2 diabetes mellitus without complication ([place]); Chest pain; Acquired hypothyroidism; Chronic lower extremity pain (Location of Secondary source of pain) (Bilateral) (L>R); Psoriasis with arthropathy ([place]); Hypothyroidism; Numbness and tingling of both legs; and Abnormal nerve conduction studies (06/20/16) on his problem list.. His primarily concern today is the Back Pain; Hip Pain; and Leg Pain   Pain Assessment: Self-Reported Pain Score: 5              Reported level is compatible with observation       Pain Type: Chronic pain Pain Location: Back Pain Orientation: Lower Pain Descriptors / Indicators: Aching, Constant, Sharp, Radiating Pain Frequency: Constant  The patient comes into the clinics today for post-procedure evaluation on the interventional treatment done on 06/26/2016. Has had problems with right foot getting cold and purple, as well as pain. It is associated with his low back pain. His NCT came back possitive for a right lumbosacral radiculopathy. Symptoms are new, including worsening, therefore, we will be ordering a repeat lumbar MRI. Blood flow of feet checked by palpation and doppler. Posterior tibialis and dorsalis pedis were both WNL. Capillary filling, and color were also WNL. He refers having some problems with urination. He is also having quite a bit of problems to this standing straight.  Date of Last Visit: 06/26/16 Service Provided on Last Visit: Procedure  Post-Procedure Assessment  Procedure done on last visit: Right L4-5 LESI, under fluoroscopy and IV sedation. Side-effects or Adverse reactions: Transient post-op increase in pain in the lower back. Sedation: Sedation given  Results: Ultra-Short Term Relief (First 1 hour after procedure): 50 %  Analgesia during this  period is likely to be Local Anesthetic and/or IV Sedative (Analgesic/Anxiolitic) related Short Term Relief (Initial 4-6 hrs after procedure): 50 % Complete  relief confirms area to be the source of pain Long Term Relief : 50 % (1 week ago the pain started coming back.  when the pain started back he reports being worse for 3 days and unable to get out of bed,  right leg was "useless"  and left foot went numb) Long-term benefit would suggest an inflammatory etiology to the pain         Current Relief (Now): 25%  Persistent relief would suggest effective anti-inflammatory effects from steroids Interpretation of Results: Some benefit observe, but he had a flare up of the back pain.  Laboratory Chemistry  Inflammation Markers Lab Results  Component Value Date   ESRSEDRATE 4 12/12/2015   CRP <0.5 12/12/2015    Renal Function Lab Results  Component Value Date   BUN 13 12/12/2015   CREATININE 1.19 12/12/2015   GFRAA >60 12/12/2015   GFRNONAA >60 12/12/2015    Hepatic Function Lab Results  Component Value Date   AST 30 12/12/2015   ALT 31 12/12/2015   ALBUMIN 4.5 12/12/2015    Electrolytes Lab Results  Component Value Date   NA 138 12/12/2015   K 4.4 12/12/2015   CL 103 12/12/2015   CALCIUM 9.7 12/12/2015   MG 2.1 12/12/2015    Pain Modulating Vitamins No results found for: [place], VD125OH2TOT, [id number], [id number], 25OHVITD1, 25OHVITD2, 25OHVITD3, VITAMINB12  Coagulation Parameters No results found for: INR, LABPROT, APTT, PLT  Cardiovascular No results found for: BNP, HGB, HCT  Note: Lab results reviewed.  Recent Diagnostic Imaging  Ct Chest Wo Contrast  Result Date: 01/25/2016 CLINICAL DATA:  Difficulty breathing over the past 6 to 8 months. History of multiple episodes bronchitis. EXAM: CT CHEST WITHOUT CONTRAST TECHNIQUE: Multidetector CT imaging of the chest was performed following the standard protocol without IV contrast. COMPARISON:  None. FINDINGS:  Mediastinum/Nodes: No chest wall, supraclavicular or axillary lymphadenopathy. Thyroid gland is grossly normal. The heart is normal in size. No pericardial effusion. Three-vessel coronary artery calcifications are noted. The aorta is normal in caliber. Scattered atherosclerotic calcifications at the aortic arch. No mediastinal or hilar mass or adenopathy. The esophagus is grossly normal. Lungs/Pleura: Mild emphysematous changes are noted. No pulmonary infiltrates or pleural effusion. Small scattered pulmonary nodules are noted. These are likely benign but will require followup. 2-3 mm nodules are noted in the left upper lobe on image 19. Small calcified granuloma is noted in the superior segment of the left lower lobe on image number 22. Small calcified granuloma noted in the left lower lobe on image number 34. 5 mm nodule in the lingula on image number 34. Sub 3 mm pulmonary nodule in the right upper lobe on image number 26. Two 3.5 mm nodules in the right lower lobe on image number 30. Upper abdomen: No significant findings. Musculoskeletal: No significant findings. IMPRESSION: 1. Multiple small bilateral pulmonary nodules as described above. Some of these are calcified granulomas. The other lesions are likely benign but I would recommend a followup noncontrast chest CT in 12 months to document stability. 2. No mediastinal or hilar mass or adenopathy. 3. Mild emphysematous changes and pulmonary scarring. 4. Three-vessel coronary artery calcifications, advanced for age. Electronically Signed   By: Marijo Sanes M.D.   On: 01/25/2016 11:18    Meds  The patient has a current medication list which includes the following prescription(s): albuterol, budesonide-formoterol, fentanyl, fentanyl, fentanyl, levothyroxine, loratadine, methotrexate, metoprolol, oxycodone hcl, oxycodone hcl, oxycodone hcl, pantoprazole, promethazine, sucralfate, temazepam, testosterone cypionate, and tizanidine.  Current  Outpatient  Prescriptions on File Prior to Visit  Medication Sig  . albuterol (PROAIR HFA) 108 (90 BASE) MCG/ACT inhaler Inhale 2 puffs into the lungs every 4 (four) hours as needed for wheezing or shortness of breath.   . budesonide-formoterol (SYMBICORT) 80-4.5 MCG/ACT inhaler Inhale 2 puffs into the lungs 2 (two) times daily.  . fentaNYL (DURAGESIC - DOSED MCG/HR) 12 MCG/HR Place 1 patch (12.5 mcg total) onto the skin every 3 (three) days.  . fentaNYL (DURAGESIC - DOSED MCG/HR) 12 MCG/HR Place 1 patch (12.5 mcg total) onto the skin every 3 (three) days.  . fentaNYL (DURAGESIC - DOSED MCG/HR) 12 MCG/HR Place 1 patch (12.5 mcg total) onto the skin every 3 (three) days.  Marland Kitchen levothyroxine (SYNTHROID, LEVOTHROID) 25 MCG tablet TAKE 1 TABLET EVERY MORNING ON AN EMPTY STOMACH WITH A GLASS OF WATERAT LEAST 30 TO 60 MINUTES BEFORE BREAKFAST  . Loratadine 10 MG CAPS Take 10 mg by mouth daily.   . methotrexate 50 MG/2ML injection Inject 50 mg/m2 as directed once a week.   . metoprolol (LOPRESSOR) 100 MG tablet TAKE 1 TABLET BY MOUTH TWICE A DAY  . Oxycodone HCl 20 MG TABS Take 1 tablet (20 mg total) by mouth every 8 (eight) hours as needed.  . Oxycodone HCl 20 MG TABS Take 1 tablet (20 mg total) by mouth every 8 (eight) hours.  . Oxycodone HCl 20 MG TABS Take 1 tablet (20 mg total) by mouth every 8 (eight) hours as needed.  . pantoprazole (PROTONIX) 40 MG tablet Take 40 mg by mouth 2 (two) times daily.   . promethazine (PHENERGAN) 12.5 MG tablet Take by mouth every 4 (four) hours as needed.   . sucralfate (CARAFATE) 1 G tablet Take 1 g by mouth 4 (four) times daily.   . temazepam (RESTORIL) 15 MG capsule Take 15 mg by mouth at bedtime as needed for sleep.  Marland Kitchen testosterone cypionate (DEPOTESTOSTERONE CYPIONATE) 200 MG/ML injection INJECT 1 ML INTRAMUSCULARY EVERY 2 WEEKS  . tiZANidine (ZANAFLEX) 4 MG capsule Take 4 mg by mouth 3 (three) times daily as needed for muscle spasms.   No current facility-administered  medications on file prior to visit.     ROS  Constitutional: Denies any fever or chills Gastrointestinal: No reported hemesis, hematochezia, vomiting, or acute GI distress Musculoskeletal: Denies any acute onset joint swelling, redness, loss of ROM, or weakness Neurological: No reported episodes of acute onset apraxia, aphasia, dysarthria, agnosia, amnesia, paralysis, loss of coordination, or loss of consciousness  Allergies  Mr. Digangi is allergic to penicillins; lactose; nsaids; prednisone; procaine; sulfa antibiotics; sulfasalazine; talwin [pentazocine]; and codeine.  [place]  Medical:  Mr. Magness  has a past medical history of Allergy; Anemia; Arthritis; Asthma; Blood transfusion without reported diagnosis; Bronchitis; Bursitis; Cancer ([place]); Cataract; 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[place]) (09/13/2015); Gastritis; GERD (gastroesophageal reflux disease); Heart murmur; Hepatitis C; Hiatal hernia; Hypertension; Hypothyroidism; Lupus ([place]); Obesity; Osteoporosis; Peripheral nerve disease ([place]); Psoriatic arthritis ([place]); Sleep apnea; Supraventricular tachycardia ([place]); Tendonitis; and Thyroid disease. Family: family history includes Arthritis in his father and mother; COPD in his father; Cancer in his father and mother; Diabetes in his mother; Hypertension in his father and mother; Stroke in his mother. Surgical:  has a past surgical history that includes Spine surgery; Eye surgery; Fracture surgery (Right); Fracture surgery (Bilateral); Fracture surgery (Bilateral); Joint replacement (Right); Spinal cord stimulator implant; Spinal cord stimulator removal; Patella arthroplasty; Shoulder arthroscopy (Bilateral); and Esophagogastroduodenoscopy (egd) with propofol (N/A, 01/20/2016). Tobacco:  reports that he has quit smoking. His smoking use included Cigarettes. His smokeless tobacco use includes Chew. Alcohol:  reports that he does not drink alcohol. Drug:  reports  that he does not use drugs.  Constitutional Exam  Vitals: Blood pressure 123/79, pulse 74, temperature 98.1 F (36.7 C), temperature source Oral, resp. rate 16, height 5\' 11"  (1.803 m), weight 265 lb (120.2 kg), SpO2 96 %. General appearance: Well nourished, well developed, and well hydrated. In no acute distress Calculated BMI/Body habitus: Body mass index is 36.96 kg/m. (35-39.9 kg/m2) Severe obesity (Class II) - 136% higher incidence of chronic pain Psych/Mental status: Alert and oriented x 3 (person, place, & time) Eyes: PERLA Respiratory: No evidence of acute respiratory distress  Cervical Spine Exam  Inspection: No masses, redness, or swelling Alignment: Symmetrical Functional ROM: Limited ROM Stability: No instability detected Muscle strength & Tone: Functionally intact Sensory: Unimpaired Palpation: Non-contributory  Upper Extremity (UE) Exam    Side: Right upper extremity  Side: Left upper extremity  Inspection: No masses, redness, swelling, or asymmetry  Inspection: No masses, redness, swelling, or asymmetry  Functional ROM: ROM appears unrestricted          Functional ROM: ROM appears unrestricted          Muscle strength & Tone: Functionally intact  Muscle strength & Tone: Functionally intact  Sensory: Unimpaired  Sensory: Unimpaired  Palpation: Non-contributory  Palpation: Non-contributory   Thoracic Spine Exam  Inspection: No masses, redness, or swelling Alignment: Symmetrical Functional ROM: ROM appears unrestricted Stability: No instability detected Sensory: Unimpaired Muscle strength & Tone: Functionally intact Palpation: Non-contributory  Lumbar Spine Exam  Inspection: No masses, redness, or swelling Alignment: Symmetrical Functional ROM: Minimal ROM Stability: No instability detected Muscle strength & Tone: Increased muscle tone over affected area Sensory: Movement-associated pain Palpation: Complains of area being tender to palpation Provocative  Tests: Lumbar Hyperextension and rotation test: Positive bilaterally for facet joint pain. Patrick's Maneuver: evaluation deferred today              Gait & Posture Assessment  Ambulation: Unassisted Gait: Antalgic Posture: Difficulty standing up straight, due to pain   Lower Extremity Exam    Side: Right lower extremity  Side: Left lower extremity  Inspection: No masses, redness, swelling, or asymmetry  Inspection: No masses, redness, swelling, or asymmetry  Functional ROM: ROM appears unrestricted          Functional ROM: ROM appears unrestricted          Muscle strength & Tone: Unable to heel walk with the right leg  Muscle strength & Tone: Able to Toe-walk & Heel-walk without problems  Sensory: Unimpaired  Sensory: Unimpaired  Palpation: Non-contributory  Palpation: Non-contributory    Assessment & Plan  Primary Diagnosis & Pertinent Problem List: The primary encounter diagnosis was Chronic pain. Diagnoses of Chronic low back pain (Location of Primary Source of Pain) (Bilateral) (R>L), Chronic pain of lower extremity, unspecified laterality, Chronic lumbar radicular pain (Location of Secondary source of pain) (Bilateral) (L>R) (L5 Dermatome), Lumbar facet syndrome (Location of Primary Source of Pain) (Bilateral) (R>L), Abnormal nerve conduction studies (06/20/16), and 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) were also pertinent to this visit.  Visit Diagnosis: 1. Chronic pain   2. Chronic low back pain (Location of Primary Source of Pain) (Bilateral) (R>L)   3. Chronic pain of lower extremity, unspecified laterality   4. Chronic lumbar radicular pain (Location of Secondary source of pain) (Bilateral) (L>R) (L5 Dermatome)  5. Lumbar facet syndrome (Location of Primary Source of Pain) (Bilateral) (R>L)   6. Abnormal nerve conduction studies (06/20/16)   7. 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)     Problem-specific Plan(s): No  problem-specific Assessment & Plan notes found for this encounter.   Plan of Care   Problem List Items Addressed This Visit      High   Abnormal nerve conduction studies (06/20/16)   Relevant Orders   Ambulatory referral to Neurosurgery   MR Lumbar Spine Wo Contrast   Chronic low back pain (Location of Primary Source of Pain) (Bilateral) (R>L) (Chronic)   Chronic lower extremity pain (Location of Secondary source of pain) (Bilateral) (L>R) (Chronic)   Relevant Orders   Ambulatory referral to Neurosurgery   Chronic lumbar radicular pain (Location of Secondary source of pain) (Bilateral) (L>R) (L5 Dermatome) (Chronic)   Relevant Orders   Ambulatory referral to Neurosurgery   MR Lumbar Spine Wo Contrast   Chronic pain - Primary (Chronic)   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) (Chronic)   Lumbar facet syndrome (Location of Primary Source of Pain) (Bilateral) (R>L) (Chronic)    Other Visit Diagnoses   None.      Pharmacotherapy (Medications Ordered): No orders of the defined types were placed in this encounter.   Lab-work & Procedure Ordered: Orders Placed This Encounter  Procedures  . MR Lumbar Spine Wo Contrast    Standing Status:   Future    Standing Expiration Date:   07/12/2017    Scheduling Instructions:     Please provide canal diameter in millimeters when describing any spinal stenosis.    Order Specific Question:   Reason for Exam (SYMPTOM  OR DIAGNOSIS REQUIRED)    Answer:   Lumbar radiculopathy/radiculitis    Order Specific Question:   Preferred imaging location?    Answer:   [facility]    Order Specific Question:   Does the patient have a pacemaker or implanted devices?    Answer:   No    Order Specific Question:   What is the patient's sedation requirement?    Answer:   No Sedation    Order Specific Question:   Call Results- Best Contact Number?    Answer:   [id number] (Pain Clinic facility) (Dr. Dossie Arbour)  . Ambulatory  referral to Neurosurgery    Referral Priority:   Routine    Referral Type:   Surgical    Referral Reason:   Specialty Services Required    Requested Specialty:   Neurosurgery    Number of Visits Requested:   1    Imaging Ordered: AMB REFERRAL TO NEUROSURGERY MR LUMBAR SPINE WO CONTRAST  Interventional Therapies: Scheduled: None at this time.    Considering: 1. Right-sided cervical epidural steroid injection.  2. Right-sided L4-5 lumbar epidural steroid injection.  3. Diagnostic bilateral lumbar facet block. 4. Possible bilateral lumbar facet radiofrequency ablation. 5. Diagnostic bilateral cervical facet block.  6. Possible bilateral cervical facet radiofrequency ablation.    PRN Procedures: 1. For the arm pain and numbness, right-sided cervical epidural steroid injection under fluoroscopic guidance and IV sedation.  2. For the lower extremity pain, right-sided L4-5 lumbar epidural steroid injection under fluoroscopic guidance and IV sedation.  3. For the low back pain, diagnostic bilateral lumbar facet block under fluoroscopic guidance and IV sedation.  4. For the neck pain, diagnostic bilateral cervical facet block under fluoroscopic guidance and IV sedation.    Referral(s) or Consult(s):  None at this time.  Medications administered during this visit: Mr. Heinzman had no medications administered during this visit.  Requested PM Follow-up: Return for Keep prior appointment, Med-Mgmt, (PRN) Procedure.  Future Appointments Date Time Provider [address]  09/10/2016 8:00 AM Milinda Pointer, MD [facility] None  01/24/2017 10:30 AM [facility]    Primary Care Physician: [name], MD Location: [facility] Note by: Kathlen Brunswick. Dossie Arbour, M.D, DABA, DABAPM, DABPM, DABIPP, FIPP  Pain Score Disclaimer: We use the NRS-11 scale. This is a self-reported, subjective measurement of pain severity with only modest accuracy.  It is used primarily to identify changes within a particular patient. It must be understood that outpatient pain scales are significantly less accurate that those used for research, where they can be applied under ideal controlled circumstances with minimal exposure to variables. In reality, the score is likely to be a combination of pain intensity and pain affect, where pain affect describes the degree of emotional arousal or changes in action readiness caused by the sensory experience of pain. Factors such as social and work situation, setting, emotional state, anxiety levels, expectation, and prior pain experience may influence pain perception and show large inter-individual differences that may also be affected by time variables.  Patient instructions provided during this appointment: Patient Instructions   Facet Blocks Patient Information  Description: The facets are joints in the spine between the vertebrae.  Like any joints in the body, facets can become irritated and painful.  Arthritis can also effect the facets.  By injecting steroids and local anesthetic in and around these joints, we can temporarily block the nerve supply to them.  Steroids act directly on irritated nerves and tissues to reduce selling and inflammation which often leads to decreased pain.  Facet blocks may be done anywhere along the spine from the neck to the low back depending upon the location of your pain.   After numbing the skin with local anesthetic (like Novocaine), a small needle is passed onto the facet joints under x-ray guidance.  You may experience a sensation of pressure while this is being done.  The entire block usually lasts about 15-25 minutes.   Conditions which may be treated by facet blocks:   Low back/buttock pain  Neck/shoulder pain  Certain types of headaches  Preparation for the injection:  1. Do not eat any solid food or dairy products within 8 hours of your appointment. 2. You may drink  clear liquid up to 3 hours before appointment.  Clear liquids include water, black coffee, juice or soda.  No milk or cream please. 3. You may take your regular medication, including pain medications, with a sip of water before your appointment.  Diabetics should hold regular insulin (if taken separately) and take 1/2 normal NPH dose the morning of the procedure.  Carry some sugar containing items with you to your appointment. 4. A driver must accompany you and be prepared to drive you home after your procedure. 5. Bring all your current medications with you. 6. An IV may be inserted and sedation may be given at the discretion of the physician. 7. A blood pressure cuff, EKG and other monitors will often be applied during the procedure.  Some patients may need to have extra oxygen administered for a short period. 8. You will be asked to provide medical information, including your allergies and medications, prior to the procedure.  We must know immediately if you are taking blood thinners (like Coumadin/Warfarin) or  if you are allergic to IV iodine contrast (dye).  We must know if you could possible be pregnant.  Possible side-effects:   Bleeding from needle site  Infection (rare, may require surgery)  Nerve injury (rare)  Numbness & tingling (temporary)  Difficulty urinating (rare, temporary)  Spinal headache (a headache worse with upright posture)  Light-headedness (temporary)  Pain at injection site (serveral days)  Decreased blood pressure (rare, temporary)  Weakness in arm/leg (temporary)  Pressure sensation in back/neck (temporary)   Call if you experience:   Fever/chills associated with headache or increased back/neck pain  Headache worsened by an upright position  New onset, weakness or numbness of an extremity below the injection site  Hives or difficulty breathing (go to the emergency room)  Inflammation or drainage at the injection site(s)  Severe back/neck  pain greater than usual  New symptoms which are concerning to you  Please note:  Although the local anesthetic injected can often make your back or neck feel good for several hours after the injection, the pain will likely return. It takes 3-7 days for steroids to work.  You may not notice any pain relief for at least one week.  If effective, we will often do a series of 2-3 injections spaced 3-6 weeks apart to maximally decrease your pain.  After the initial series, you may be a candidate for a more permanent nerve block of the facets.  If you have any questions, please call #[phone]  What are the risk, side effects and possible complications? Generally speaking, most procedures are safe.  However, with any procedure there are risks, side effects, and the possibility of complications.  The risks and complications are dependent upon the sites that are lesioned, or the type of nerve block to be performed.  The closer the procedure is to the spine, the more serious the risks are.  Great care is taken when placing the radio frequency needles, block needles or lesioning probes, but sometimes complications can occur. 1. Infection: Any time there is an injection through the skin, there is a risk of infection.  This is why sterile conditions are used for these blocks.  There are four possible types of infection. 1. Localized skin infection. 2. Central Nervous System Infection-This can be in the form of Meningitis, which can be deadly. 3. Epidural Infections-This can be in the form of an epidural abscess, which can cause pressure inside of the spine, causing compression of the spinal cord with subsequent paralysis. This would require an emergency surgery to decompress, and there are no guarantees that the patient would recover from the paralysis. 4. Discitis-This is an infection of the intervertebral discs.  It occurs in  about 1% of discography procedures.  It is difficult to treat and it may lead to surgery.        2. Pain: the needles have to go through skin and soft tissues, will cause soreness.       3. Damage to internal structures:  The nerves to be lesioned may be near blood vessels or    other nerves which can be potentially damaged.       4. Bleeding: Bleeding is more common if the patient is taking blood thinners such as  aspirin, Coumadin, Ticiid, Plavix, etc., or if he/she have some genetic predisposition  such as hemophilia. Bleeding into the spinal canal can cause compression of the spinal  cord with subsequent paralysis.  This  would require an emergency surgery to  decompress and there are no guarantees that the patient would recover from the  paralysis.       5. Pneumothorax:  Puncturing of a lung is a possibility, every time a needle is introduced in  the area of the chest or upper back.  Pneumothorax refers to free air around the  collapsed lung(s), inside of the thoracic cavity (chest cavity).  Another two possible  complications related to a similar event would include: Hemothorax and Chylothorax.   These are variations of the Pneumothorax, where instead of air around the collapsed  lung(s), you may have blood or chyle, respectively.       6. Spinal headaches: They may occur with any procedures in the area of the spine.       7. Persistent CSF (Cerebro-Spinal Fluid) leakage: This is a rare problem, but may occur  with prolonged intrathecal or epidural catheters either due to the formation of a fistulous  track or a dural tear.       8. Nerve damage: By working so close to the spinal cord, there is always a possibility of  nerve damage, which could be as serious as a permanent spinal cord injury with  paralysis.       9. Death:  Although rare, severe deadly allergic reactions known as "Anaphylactic  reaction" can occur to any of the medications used.      10. Worsening of the symptoms:  We can always  make thing worse.  What are the chances of something like this happening? Chances of any of this occuring are extremely low.  By statistics, you have more of a chance of getting killed in a motor vehicle accident: while driving to the hospital than any of the above occurring .  Nevertheless, you should be aware that they are possibilities.  In general, it is similar to taking a shower.  Everybody knows that you can slip, hit your head and get killed.  Does that mean that you should not shower again?  Nevertheless always keep in mind that statistics do not mean anything if you happen to be on the wrong side of them.  Even if a procedure has a 1 (one) in a 1,000,000 (million) chance of going wrong, it you happen to be that one..Also, keep in mind that by statistics, you have more of a chance of having something go wrong when taking medications.  Who should not have this procedure? If you are on a blood thinning medication (e.g. Coumadin, Plavix, see list of "Blood Thinners"), or if you have an active infection going on, you should not have the procedure.  If you are taking any blood thinners, please inform your physician.  How should I prepare for this procedure?  Do not eat or drink anything at least six hours prior to the procedure.  Bring a driver with you .  It cannot be a taxi.  Come accompanied by an adult that can drive you back, and that is strong enough to help you if your legs get weak or numb from the local anesthetic.  Take all of your medicines the morning of the procedure with just enough water to swallow them.  If you have diabetes, make sure that you are scheduled to have your procedure done first thing in the morning, whenever possible.  If you have diabetes, take only half of your insulin dose and notify our nurse that you have done so as soon as you  arrive at the clinic.  If you are diabetic, but only take blood sugar pills (oral hypoglycemic), then do not take them on the  morning of your procedure.  You may take them after you have had the procedure.  Do not take aspirin or any aspirin-containing medications, at least eleven (11) days prior to the procedure.  They may prolong bleeding.  Wear loose fitting clothing that may be easy to take off and that you would not mind if it got stained with Betadine or blood.  Do not wear any jewelry or perfume  Remove any nail coloring.  It will interfere with some of our monitoring equipment.  NOTE: Remember that this is not meant to be interpreted as a complete list of all possible complications.  Unforeseen problems may occur.  BLOOD THINNERS The following drugs contain aspirin or other products, which can cause increased bleeding during surgery and should not be taken for 2 weeks prior to and 1 week after surgery.  If you should need take something for relief of minor pain, you may take acetaminophen which is found in Tylenol,m Datril, Anacin-3 and Panadol. It is not blood thinner. The products listed below are.  Do not take any of the products listed below in addition to any listed on your instruction sheet.  A.P.C or A.P.C with Codeine Codeine Phosphate Capsules #3 Ibuprofen Ridaura  ABC compound Congesprin Imuran rimadil  Advil Cope Indocin Robaxisal  Alka-Seltzer Effervescent Pain Reliever and Antacid Coricidin or Coricidin-D  Indomethacin Rufen  Alka-Seltzer plus Cold Medicine Cosprin Ketoprofen S-A-C Tablets  Anacin Analgesic Tablets or Capsules Coumadin Korlgesic Salflex  Anacin Extra Strength Analgesic tablets or capsules CP-2 Tablets Lanoril Salicylate  Anaprox Cuprimine Capsules Levenox Salocol  Anexsia-D Dalteparin Magan Salsalate  Anodynos Darvon compound Magnesium Salicylate Sine-off  Ansaid Dasin Capsules Magsal Sodium Salicylate  Anturane Depen Capsules Marnal Soma  APF Arthritis pain formula Dewitt's Pills Measurin Stanback  Argesic Dia-Gesic Meclofenamic Sulfinpyrazone  Arthritis Bayer Timed  Release Aspirin Diclofenac Meclomen Sulindac  Arthritis pain formula Anacin Dicumarol Medipren Supac  Analgesic (Safety coated) Arthralgen Diffunasal Mefanamic Suprofen  Arthritis Strength Bufferin Dihydrocodeine Mepro Compound Suprol  Arthropan liquid Dopirydamole Methcarbomol with Aspirin Synalgos  ASA tablets/Enseals Disalcid Micrainin Tagament  Ascriptin Doan's Midol Talwin  Ascriptin A/D Dolene Mobidin Tanderil  Ascriptin Extra Strength Dolobid Moblgesic Ticlid  Ascriptin with Codeine Doloprin or Doloprin with Codeine Momentum Tolectin  Asperbuf Duoprin Mono-gesic Trendar  Aspergum Duradyne Motrin or Motrin IB Triminicin  Aspirin plain, buffered or enteric coated Durasal Myochrisine Trigesic  Aspirin Suppositories Easprin Nalfon Trillsate  Aspirin with Codeine Ecotrin Regular or Extra Strength Naprosyn Uracel  Atromid-S Efficin Naproxen Ursinus  Auranofin Capsules Elmiron Neocylate Vanquish  Axotal Emagrin Norgesic Verin  Azathioprine Empirin or Empirin with Codeine Normiflo Vitamin E  Azolid Emprazil Nuprin Voltaren  Bayer Aspirin plain, buffered or children's or timed BC Tablets or powders Encaprin Orgaran Warfarin Sodium  Buff-a-Comp Enoxaparin Orudis Zorpin  Buff-a-Comp with Codeine Equegesic Os-Cal-Gesic   Buffaprin Excedrin plain, buffered or Extra Strength Oxalid   Bufferin Arthritis Strength Feldene Oxphenbutazone   Bufferin plain or Extra Strength Feldene Capsules Oxycodone with Aspirin   Bufferin with Codeine Fenoprofen Fenoprofen Pabalate or Pabalate-SF   Buffets II Flogesic Panagesic   Buffinol plain or Extra Strength Florinal or Florinal with Codeine Panwarfarin   Buf-Tabs Flurbiprofen Penicillamine   Butalbital Compound Four-way cold tablets Penicillin   Butazolidin Fragmin Pepto-Bismol   Carbenicillin Geminisyn Percodan   Carna Arthritis Reliever Geopen Persantine  Carprofen Gold's salt Persistin   Chloramphenicol Goody's Phenylbutazone   Chloromycetin  [place] Piroxlcam   Clmetidine heparin Plaquenil   Cllnoril Hyco-pap Ponstel   Clofibrate Hydroxy chloroquine Propoxyphen         Before stopping any of these medications, be sure to consult the physician who ordered them.  Some, such as Coumadin (Warfarin) are ordered to prevent or treat serious conditions such as "deep thrombosis", "pumonary embolisms", and other heart problems.  The amount of time that you may need off of the medication may also vary with the medication and the reason for which you were taking it.  If you are taking any of these medications, please make sure you notify your pain physician before you undergo any procedures.

## 2016-07-13 ENCOUNTER — Other Ambulatory Visit: Payer: Self-pay

## 2016-07-15 DIAGNOSIS — G4733 Obstructive sleep apnea (adult) (pediatric): Secondary | ICD-10-CM | POA: Diagnosis not present

## 2016-08-14 DIAGNOSIS — G4733 Obstructive sleep apnea (adult) (pediatric): Secondary | ICD-10-CM | POA: Diagnosis not present

## 2016-08-22 DIAGNOSIS — R05 Cough: Secondary | ICD-10-CM | POA: Diagnosis not present

## 2016-08-23 DIAGNOSIS — G4700 Insomnia, unspecified: Secondary | ICD-10-CM | POA: Diagnosis not present

## 2016-08-23 DIAGNOSIS — G4733 Obstructive sleep apnea (adult) (pediatric): Secondary | ICD-10-CM | POA: Diagnosis not present

## 2016-08-23 DIAGNOSIS — J439 Emphysema, unspecified: Secondary | ICD-10-CM | POA: Diagnosis not present

## 2016-08-23 DIAGNOSIS — R05 Cough: Secondary | ICD-10-CM | POA: Diagnosis not present

## 2016-08-23 DIAGNOSIS — J309 Allergic rhinitis, unspecified: Secondary | ICD-10-CM | POA: Diagnosis not present

## 2016-09-03 DIAGNOSIS — K219 Gastro-esophageal reflux disease without esophagitis: Secondary | ICD-10-CM | POA: Diagnosis not present

## 2016-09-03 DIAGNOSIS — K224 Dyskinesia of esophagus: Secondary | ICD-10-CM | POA: Diagnosis not present

## 2016-09-03 DIAGNOSIS — E119 Type 2 diabetes mellitus without complications: Secondary | ICD-10-CM | POA: Diagnosis not present

## 2016-09-03 DIAGNOSIS — R079 Chest pain, unspecified: Secondary | ICD-10-CM | POA: Diagnosis not present

## 2016-09-03 DIAGNOSIS — I480 Paroxysmal atrial fibrillation: Secondary | ICD-10-CM | POA: Diagnosis not present

## 2016-09-03 DIAGNOSIS — I10 Essential (primary) hypertension: Secondary | ICD-10-CM | POA: Diagnosis not present

## 2016-09-03 DIAGNOSIS — I491 Atrial premature depolarization: Secondary | ICD-10-CM | POA: Diagnosis not present

## 2016-09-03 DIAGNOSIS — G4733 Obstructive sleep apnea (adult) (pediatric): Secondary | ICD-10-CM | POA: Diagnosis not present

## 2016-09-03 DIAGNOSIS — I471 Supraventricular tachycardia: Secondary | ICD-10-CM | POA: Diagnosis not present

## 2016-09-03 DIAGNOSIS — R931 Abnormal findings on diagnostic imaging of heart and coronary circulation: Secondary | ICD-10-CM | POA: Diagnosis not present

## 2016-09-10 ENCOUNTER — Encounter: Payer: PPO | Admitting: Pain Medicine

## 2016-09-11 ENCOUNTER — Encounter: Payer: Self-pay | Admitting: Pain Medicine

## 2016-09-11 ENCOUNTER — Ambulatory Visit: Payer: PPO | Attending: Pain Medicine | Admitting: Pain Medicine

## 2016-09-11 VITALS — BP 122/75 | HR 68 | Temp 98.5°F | Resp 16 | Ht 72.0 in | Wt 262.0 lb

## 2016-09-11 DIAGNOSIS — Z880 Allergy status to penicillin: Secondary | ICD-10-CM | POA: Insufficient documentation

## 2016-09-11 DIAGNOSIS — M79604 Pain in right leg: Secondary | ICD-10-CM

## 2016-09-11 DIAGNOSIS — Z79891 Long term (current) use of opiate analgesic: Secondary | ICD-10-CM | POA: Insufficient documentation

## 2016-09-11 DIAGNOSIS — G894 Chronic pain syndrome: Secondary | ICD-10-CM | POA: Diagnosis not present

## 2016-09-11 DIAGNOSIS — M79605 Pain in left leg: Secondary | ICD-10-CM | POA: Diagnosis not present

## 2016-09-11 DIAGNOSIS — R079 Chest pain, unspecified: Secondary | ICD-10-CM | POA: Diagnosis not present

## 2016-09-11 DIAGNOSIS — M5416 Radiculopathy, lumbar region: Secondary | ICD-10-CM

## 2016-09-11 DIAGNOSIS — Z79899 Other long term (current) drug therapy: Secondary | ICD-10-CM | POA: Insufficient documentation

## 2016-09-11 DIAGNOSIS — Z5181 Encounter for therapeutic drug level monitoring: Secondary | ICD-10-CM | POA: Insufficient documentation

## 2016-09-11 DIAGNOSIS — M545 Low back pain: Secondary | ICD-10-CM | POA: Insufficient documentation

## 2016-09-11 DIAGNOSIS — G8929 Other chronic pain: Secondary | ICD-10-CM | POA: Diagnosis not present

## 2016-09-11 DIAGNOSIS — E119 Type 2 diabetes mellitus without complications: Secondary | ICD-10-CM | POA: Insufficient documentation

## 2016-09-11 DIAGNOSIS — F1190 Opioid use, unspecified, uncomplicated: Secondary | ICD-10-CM

## 2016-09-11 DIAGNOSIS — R200 Anesthesia of skin: Secondary | ICD-10-CM | POA: Insufficient documentation

## 2016-09-11 DIAGNOSIS — M25562 Pain in left knee: Secondary | ICD-10-CM | POA: Diagnosis not present

## 2016-09-11 DIAGNOSIS — E039 Hypothyroidism, unspecified: Secondary | ICD-10-CM | POA: Insufficient documentation

## 2016-09-11 DIAGNOSIS — R94130 Abnormal response to nerve stimulation, unspecified: Secondary | ICD-10-CM

## 2016-09-11 DIAGNOSIS — M542 Cervicalgia: Secondary | ICD-10-CM | POA: Diagnosis not present

## 2016-09-11 DIAGNOSIS — Z87891 Personal history of nicotine dependence: Secondary | ICD-10-CM | POA: Insufficient documentation

## 2016-09-11 MED ORDER — OXYCODONE HCL 20 MG PO TABS: 20.0000 mg | ORAL_TABLET | Freq: Three times a day (TID) | ORAL | 0 refills | Status: DC | PRN

## 2016-09-11 MED ORDER — OXYCODONE HCL 20 MG PO TABS: 20.0000 mg | ORAL_TABLET | Freq: Three times a day (TID) | ORAL | 0 refills | Status: DC

## 2016-09-11 MED ORDER — FENTANYL 12 MCG/HR TD PT72: 12.5000 ug | MEDICATED_PATCH | TRANSDERMAL | 0 refills | Status: DC

## 2016-09-11 NOTE — Progress Notes (Signed)
Patient's Name: James [name]  MRN: [id number]  Referring Provider: [name], MD  DOB: May 28, 1956  PCP: [name], MD  DOS: 09/11/2016  Note by: Kathlen Brunswick. Dossie Arbour, MD  Service setting: Ambulatory outpatient  Specialty: Interventional Pain Management  Location: [facility] (AMB) Pain Management Facility    Patient type: Established   Primary Reason(s) for Visit: Encounter for prescription drug management (Level of risk: moderate) CC: Back Pain (from cervical  spine all the way down to coccyx)  HPI  Mr. Mendonca is a 60 y.o. year old, male patient, who comes today for a medication management evaluation. He has Airway hyperreactivity; Atrial fibrillation ([place]); Narrowing of intervertebral disc space; Abnormal echocardiogram; Gastric ulcer; Gastric catarrh; Acid reflux; Awareness of heartbeats; Hemorrhoid; Hepatitis; BP (high blood pressure); Lactose intolerance; Osteopenia; APC (atrial premature contractions); Apnea, sleep; Supraventricular tachycardia ([place]); Chronic pain; Long term current use of opiate analgesic; Long term prescription opiate use; Opiate use (120 MME/Day); Opiate dependence ([place]); Encounter for therapeutic drug level monitoring; Chronic low back pain (Location of Primary Source of Pain) (Bilateral) (R>L); Lumbar spondylosis; Chronic neck pain (Right); Cervical spondylosis (C7 intravertebral body cyst); Chronic cervical radicular pain (Right); Chronic lumbar radicular pain (Location of Secondary source of pain) (Bilateral) (L>R) (L5 Dermatome); Osteoarthritis; Chronic hip pain; Chronic knee pain (Bilateral) (R>L); Complex regional pain syndrome type II of upper extremity (Right); Chronic upper extremity pain (Right); 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 Raquel Sarna); Epidural fibrosis; Musculoskeletal pain; Neurogenic pain; Neuropathic pain; Chronic sacroiliac joint pain (Bilateral) (R>L); Psoriatic arthritis ([place]); Controlled type 2 diabetes mellitus without complication ([place]); Chest pain; Acquired hypothyroidism; Chronic lower extremity pain (Location of Secondary source of pain) (Bilateral) (L>R); Psoriasis with arthropathy ([place]); Hypothyroidism; Numbness and tingling of both legs; and Abnormal nerve conduction studies (06/20/16) on his problem list. His primarily concern today is the Back Pain (from cervical  spine all the way down to coccyx)  Pain Assessment: Self-Reported Pain Score: 5 /10 Clinically the patient looks like a 2/10 Reported level is inconsistent with clinical observations. Information on the proper use of the pain score provided to the patient today. Pain Type: Chronic pain Pain Location: Back (whole) Pain Descriptors / Indicators: Constant, Aching, Stabbing, Throbbing Pain Frequency: Constant  Mr. Madlock was last seen on 07/12/2016 for medication management. During today's appointment we reviewed Mr. Coluccio chronic pain status, as well as his outpatient medication regimen.  The patient  reports that he does not use drugs. His body mass index is 35.53 kg/m.  Further details on both, my assessment(s), as well as the proposed treatment plan, please see below.  Controlled Substance Pharmacotherapy Assessment REMS (Risk Evaluation and Mitigation Strategy)  Analgesic:Fentanyl patch 12.5 mcg/h every 72 hours + oxycodone IR 20 mg every 6 hours (60 mg/day) MME/day:120 mg/day Azalee Course, RN  09/11/2016  8:10 AM  Sign at close encounter Nursing Pain Medication Assessment:  Safety precautions to be maintained throughout the outpatient stay will include: orient to surroundings, keep bed in low position, maintain call bell within reach at all times, provide assistance with transfer out of bed and  ambulation.  Medication Inspection Compliance: Pill count conducted under aseptic conditions, in front of the patient. Neither the pills nor the bottle was removed from the patient's sight at any time. Once count was completed pills were  immediately returned to the patient in their original bottle.  Medication #1: Oxycodone IR Pill Count: 47 of 90 pills remain Bottle Appearance: Standard pharmacy container. Clearly labeled. Filled Date: 110 / 24 / 2017 Medication last intake:09/11/16 at 5am  Medication #2: Duragesic Pill Count: 5 of 10 pills remain Bottle Appearance: Standard pharmacy container. Clearly labeled. Filled Date: 10 / 24 / 2017 Medication last intake: 09/10/16 1130pm   Pharmacokinetics: Liberation and absorption (onset of action): WNL Distribution (time to peak effect): WNL Metabolism and excretion (duration of action): WNL         Pharmacodynamics: Desired effects: Analgesia: The patient reports >50% benefit. Reported improvement in function: The patient reports medication allows him to accomplish basic ADLs. Clinically meaningful improvement in function (CMIF): Sustained CMIF goals met Perceived effectiveness: Described as relatively effective, allowing for increase in activities of daily living (ADL) Undesirable effects: Side-effects or Adverse reactions: None reported Monitoring: [place] PMP: Online review of the past 65-monthperiod conducted. Compliant with practice rules and regulations List of all UDS test(s) done:  Lab Results  Component Value Date   TOXASSSELUR FINAL 03/07/2016   TOXASSSELUR FINAL 12/12/2015   TOXASSSELUR FINAL 09/12/2015   Last UDS on record: ToxAssure Select 13  Date Value Ref Range Status  03/07/2016 FINAL  Final    Comment:    ==================================================================== TOXASSURE SELECT 13 (MW) ==================================================================== Test                             Result       Flag        Units Drug Present and Declared for Prescription Verification   Oxycodone                      1784         EXPECTED   ng/mg creat   Oxymorphone                    983          EXPECTED   ng/mg creat   Noroxycodone                   >3876        EXPECTED   ng/mg creat   Noroxymorphone                 418          EXPECTED   ng/mg creat    Sources of oxycodone are scheduled prescription medications.    Oxymorphone, noroxycodone, and noroxymorphone are expected    metabolites of oxycodone. Oxymorphone is also available as a    scheduled prescription medication.   Fentanyl                       2            EXPECTED   ng/mg creat   Norfentanyl                    47           EXPECTED   ng/mg creat    Source of fentanyl is a scheduled prescription medication,    including IV, patch, and transmucosal formulations. Norfentanyl    is an expected metabolite of fentanyl. Drug Absent but Declared for Prescription Verification   Temazepam  Not Detected UNEXPECTED ng/mg creat ==================================================================== Test                      Result    Flag   Units      Ref Range   Creatinine              258              mg/dL      >=20 ==================================================================== Declared Medications:  The flagging and interpretation on this report are based on the  following declared medications.  Unexpected results may arise from  inaccuracies in the declared medications.  **Note: The testing scope of this panel includes these medications:  Fentanyl (Duragesic)  Oxycodone  Temazepam (Restoril)  **Note: The testing scope of this panel does not include following  reported medications:  Albuterol (Combivent)  Albuterol (Proventil)  Budenoside (Symbicort)  Cyanocobalamin  Folic acid (Folvite)  Formoterol (Symbicort)  Ipratropium (Combivent)  Levothyroxine (Synthroid)  Loratadine  Methotrexate  Metoprolol   Multivitamin (MVI)  Oxymetazoline (Afrin)  Pantoprazole (Protonix)  Prednisone (Deltasone)  Sucralfate (Carafate)  Testosterone (Depo-Testosterone)  Tizanidine (Zanaflex)  Vitamin C  Vitamin D3 ==================================================================== For clinical consultation, please call [phone]. ====================================================================    UDS interpretation: Compliant          Medication Assessment Form: Reviewed. Patient indicates being compliant with therapy Treatment compliance: Compliant Risk Assessment Profile: Aberrant behavior: See prior evaluations. None observed or detected today Comorbid factors increasing risk of overdose: See prior notes. No additional risks detected today Risk of substance use disorder (SUD): Low Opioid Risk Tool (ORT) Total Score: 0  Interpretation Table:  Score <3 = Low Risk for SUD  Score between 4-7 = Moderate Risk for SUD  Score >8 = High Risk for Opioid Abuse   Risk Mitigation Strategies:  Patient Counseling: Covered Patient-Prescriber Agreement (PPA): Present and active  Notification to other healthcare providers: Done  Pharmacologic Plan: No change in therapy, at this time  Laboratory Chemistry  Inflammation Markers Lab Results  Component Value Date   ESRSEDRATE 4 12/12/2015   CRP <0.5 12/12/2015   Renal Function Lab Results  Component Value Date   BUN 13 12/12/2015   CREATININE 1.19 12/12/2015   GFRAA >60 12/12/2015   GFRNONAA >60 12/12/2015   Hepatic Function Lab Results  Component Value Date   AST 30 12/12/2015   ALT 31 12/12/2015   ALBUMIN 4.5 12/12/2015   Electrolytes Lab Results  Component Value Date   NA 138 12/12/2015   K 4.4 12/12/2015   CL 103 12/12/2015   CALCIUM 9.7 12/12/2015   MG 2.1 12/12/2015   Pain Modulating Vitamins No results found for: [place], VD125OH2TOT, OA4166AY3, KZ6010XN2, 25OHVITD1, 25OHVITD2, 25OHVITD3, VITAMINB12 Coagulation  Parameters No results found for: INR, LABPROT, APTT, PLT Cardiovascular No results found for: BNP, HGB, HCT Note: Lab results reviewed.  Recent Diagnostic Imaging Review  Ct Chest Wo Contrast Result Date: 01/25/2016 CLINICAL DATA:  Difficulty breathing over the past 6 to 8 months. History of multiple episodes bronchitis. EXAM: CT CHEST WITHOUT CONTRAST TECHNIQUE: Multidetector CT imaging of the chest was performed following the standard protocol without IV contrast. COMPARISON:  None. FINDINGS: Mediastinum/Nodes: No chest wall, supraclavicular or axillary lymphadenopathy. Thyroid gland is grossly normal. The heart is normal in size. No pericardial effusion. Three-vessel coronary artery calcifications are noted. The aorta is normal in caliber. Scattered atherosclerotic calcifications at the aortic arch. No mediastinal or hilar mass or adenopathy. The esophagus is grossly  normal. Lungs/Pleura: Mild emphysematous changes are noted. No pulmonary infiltrates or pleural effusion. Small scattered pulmonary nodules are noted. These are likely benign but will require followup. 2-3 mm nodules are noted in the left upper lobe on image 19. Small calcified granuloma is noted in the superior segment of the left lower lobe on image number 22. Small calcified granuloma noted in the left lower lobe on image number 34. 5 mm nodule in the lingula on image number 34. Sub 3 mm pulmonary nodule in the right upper lobe on image number 26. Two 3.5 mm nodules in the right lower lobe on image number 30. Upper abdomen: No significant findings. Musculoskeletal: No significant findings. IMPRESSION: 1. Multiple small bilateral pulmonary nodules as described above. Some of these are calcified granulomas. The other lesions are likely benign but I would recommend a followup noncontrast chest CT in 12 months to document stability. 2. No mediastinal or hilar mass or adenopathy. 3. Mild emphysematous changes and pulmonary scarring. 4.  Three-vessel coronary artery calcifications, advanced for age. Electronically Signed   By: Rudie Meyer M.D.   On: 01/25/2016 11:18   Note: Imaging results reviewed.  Meds  The patient has a current medication list which includes the following prescription(s): albuterol, azelastine, budesonide-formoterol, fentanyl, fentanyl, fentanyl, levothyroxine, loratadine, methotrexate, methotrexate, metoprolol, oxycodone hcl, oxycodone hcl, oxycodone hcl, pantoprazole, promethazine, sucralfate, temazepam, testosterone cypionate, and tizanidine.  Current Outpatient Prescriptions on File Prior to Visit  Medication Sig  . albuterol (PROAIR HFA) 108 (90 BASE) MCG/ACT inhaler Inhale 2 puffs into the lungs every 4 (four) hours as needed for wheezing or shortness of breath.   . budesonide-formoterol (SYMBICORT) 80-4.5 MCG/ACT inhaler Inhale 2 puffs into the lungs 2 (two) times daily.  Marland Kitchen levothyroxine (SYNTHROID, LEVOTHROID) 25 MCG tablet TAKE 1 TABLET EVERY MORNING ON AN EMPTY STOMACH WITH A GLASS OF WATERAT LEAST 30 TO 60 MINUTES BEFORE BREAKFAST  . Loratadine 10 MG CAPS Take 10 mg by mouth daily.   . methotrexate 50 MG/2ML injection Inject 50 mg/m2 as directed once a week.   . metoprolol (LOPRESSOR) 100 MG tablet TAKE 1 TABLET BY MOUTH TWICE A DAY  . pantoprazole (PROTONIX) 40 MG tablet Take 40 mg by mouth 2 (two) times daily.   . promethazine (PHENERGAN) 12.5 MG tablet Take by mouth every 4 (four) hours as needed.   . sucralfate (CARAFATE) 1 G tablet Take 1 g by mouth 4 (four) times daily.   . temazepam (RESTORIL) 15 MG capsule Take 15 mg by mouth at bedtime as needed for sleep.  Marland Kitchen testosterone cypionate (DEPOTESTOSTERONE CYPIONATE) 200 MG/ML injection INJECT 1 ML INTRAMUSCULARY EVERY 2 WEEKS  . tiZANidine (ZANAFLEX) 4 MG capsule Take 4 mg by mouth 3 (three) times daily as needed for muscle spasms.   No current facility-administered medications on file prior to visit.    ROS  Constitutional: Denies any  fever or chills Gastrointestinal: No reported hemesis, hematochezia, vomiting, or acute GI distress Musculoskeletal: Denies any acute onset joint swelling, redness, loss of ROM, or weakness Neurological: No reported episodes of acute onset apraxia, aphasia, dysarthria, agnosia, amnesia, paralysis, loss of coordination, or loss of consciousness  Allergies  Mr. Cohick is allergic to penicillins; lactose; nsaids; prednisone; procaine; sulfa antibiotics; sulfasalazine; talwin [pentazocine]; and codeine.  PFSH  Drug: [name]  reports that he does not use drugs. Alcohol:  reports that he does not drink alcohol. Tobacco:  reports that he has quit smoking. His smoking use included Cigarettes. His smokeless tobacco use  includes Chew. Medical:  has a past medical history of Allergy; Anemia; Arthritis; Asthma; Blood transfusion without reported diagnosis; Bronchitis; Bursitis; Cancer (HCC); Cataract; 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09/13/2015); Gastritis; GERD (gastroesophageal reflux disease); Heart murmur; Hepatitis C; Hiatal hernia; Hypertension; Hypothyroidism; Lupus; Obesity; Osteoporosis; Peripheral nerve disease (HCC); Psoriatic arthritis (HCC); Sleep apnea; Supraventricular tachycardia (HCC); Tendonitis; and Thyroid disease. Family: family history includes Arthritis in his father and mother; COPD in his father; Cancer in his father and mother; Diabetes in his mother; Hypertension in his father and mother; Stroke in his mother.  Past Surgical History:  Procedure Laterality Date  . ESOPHAGOGASTRODUODENOSCOPY (EGD) WITH PROPOFOL N/A 01/20/2016   Procedure: ESOPHAGOGASTRODUODENOSCOPY (EGD) WITH PROPOFOL;  Surgeon: Christena Deem, MD;  [facility] ENDOSCOPY;  Service: Endoscopy;  Laterality: N/A;  . EYE SURGERY     removal of foreign object  . FRACTURE SURGERY Right    arm  . FRACTURE SURGERY Bilateral    fingers  . FRACTURE SURGERY Bilateral    wrist   . JOINT REPLACEMENT Right    radial head  . PATELLA ARTHROPLASTY    . SHOULDER ARTHROSCOPY Bilateral   . SPINAL CORD STIMULATOR IMPLANT    . SPINAL CORD STIMULATOR REMOVAL    . SPINE SURGERY     cervical and thoracic and lumbar   Constitutional Exam  General appearance: Well nourished, well developed, and well hydrated. In no apparent acute distress Vitals:   09/11/16 0800  BP: 122/75  Pulse: 68  Resp: 16  Temp: 98.5 F (36.9 C)  TempSrc: Oral  Weight: 262 lb (118.8 kg)  Height: 6' (1.829 m)  PF: 98 L/min   BMI Assessment: Estimated body mass index is 35.53 kg/m as calculated from the following:   Height as of this encounter: 6' (1.829 m).   Weight as of this encounter: 262 lb (118.8 kg).  BMI interpretation table: BMI level Category Range association with higher incidence of chronic pain  <18 kg/m2 Underweight   18.5-24.9 kg/m2 Ideal body weight   25-29.9 kg/m2 Overweight Increased incidence by 20%  30-34.9 kg/m2 Obese (Class I) Increased incidence by 68%  35-39.9 kg/m2 Severe obesity (Class II) Increased incidence by 136%  >40 kg/m2 Extreme obesity (Class III) Increased incidence by 254%   BMI Readings from Last 4 Encounters:  09/11/16 35.53 kg/m  07/12/16 36.96 kg/m  06/26/16 36.26 kg/m  05/30/16 39.05 kg/m   Wt Readings from Last 4 Encounters:  09/11/16 262 lb (118.8 kg)  07/12/16 265 lb (120.2 kg)  06/26/16 260 lb (117.9 kg)  05/30/16 280 lb (127 kg)  Psych/Mental status: Alert, oriented x 3 (person, place, & time) Eyes: PERLA Respiratory: No evidence of acute respiratory distress  Cervical Spine Exam  Inspection: No masses, redness, or swelling Alignment: Symmetrical Functional ROM: Diminished ROM Stability: No instability detected Muscle strength & Tone: Functionally intact Sensory: Movement-associated discomfort Palpation: Non-contributory  Upper Extremity (UE) Exam    Side: Right upper extremity  Side: Left upper extremity  Inspection: No  masses, redness, swelling, or asymmetry  Inspection: No masses, redness, swelling, or asymmetry  Functional ROM: Unrestricted ROM         Functional ROM: Unrestricted ROM          Muscle strength & Tone: Functionally intact  Muscle strength & Tone: Functionally intact  Sensory: Unimpaired  Sensory: Unimpaired  Palpation: Non-contributory  Palpation: Non-contributory   Thoracic Spine Exam  Inspection: No masses, redness, or swelling Alignment: Symmetrical Functional ROM: Unrestricted  ROM Stability: No instability detected Sensory: Unimpaired Muscle strength & Tone: Functionally intact Palpation: Non-contributory  Lumbar Spine Exam  Inspection: No masses, redness, or swelling Alignment: Symmetrical Functional ROM: Diminished ROM Stability: No instability detected Muscle strength & Tone: Functionally intact Sensory: Movement-associated discomfort Palpation: Non-contributory Provocative Tests: Lumbar Hyperextension and rotation test: evaluation deferred today       Patrick's Maneuver: evaluation deferred today              Gait & Posture Assessment  Ambulation: Unassisted Gait: Relatively normal for age and body habitus Posture: WNL   Lower Extremity Exam    Side: Right lower extremity  Side: Left lower extremity  Inspection: No masses, redness, swelling, or asymmetry  Inspection: No masses, redness, swelling, or asymmetry  Functional ROM: Unrestricted ROM          Functional ROM: Unrestricted ROM          Muscle strength & Tone: Functionally intact  Muscle strength & Tone: Functionally intact  Sensory: Unimpaired  Sensory: Unimpaired  Palpation: Non-contributory  Palpation: Non-contributory   Assessment  Primary Diagnosis & Pertinent Problem List: The primary encounter diagnosis was Chronic low back pain (Location of Primary Source of Pain) (Bilateral) (R>L). Diagnoses of Chronic pain syndrome, Chronic lower extremity pain (Location of Secondary source of pain) (Bilateral)  (L>R), Chronic lumbar radicular pain (Location of Secondary source of pain) (Bilateral) (L>R) (L5 Dermatome), Long term current use of opiate analgesic, Opiate use (120 MME/Day), and Abnormal nerve conduction studies (06/20/16) were also pertinent to this visit.  Visit Diagnosis: 1. Chronic low back pain (Location of Primary Source of Pain) (Bilateral) (R>L)   2. Chronic pain syndrome   3. Chronic lower extremity pain (Location of Secondary source of pain) (Bilateral) (L>R)   4. Chronic lumbar radicular pain (Location of Secondary source of pain) (Bilateral) (L>R) (L5 Dermatome)   5. Long term current use of opiate analgesic   6. Opiate use (120 MME/Day)   7. Abnormal nerve conduction studies (06/20/16)    Plan of Care  Pharmacotherapy (Medications Ordered): Meds ordered this encounter  Medications  . Oxycodone HCl 20 MG TABS    Sig: Take 1 tablet (20 mg total) by mouth every 8 (eight) hours as needed.    Dispense:  90 tablet    Refill:  0    Do not place this medication, or any other prescription from our practice, on "Automatic Refill". Patient may have prescription filled one day early if pharmacy is closed on scheduled refill date. Do not fill until: 09/21/16 To last until: 10/21/16  . Oxycodone HCl 20 MG TABS    Sig: Take 1 tablet (20 mg total) by mouth every 8 (eight) hours.    Dispense:  90 tablet    Refill:  0    Do not place this medication, or any other prescription from our practice, on "Automatic Refill". Patient may have prescription filled one day early if pharmacy is closed on scheduled refill date. Do not fill until: 10/21/16 To last until: 11/20/16  . Oxycodone HCl 20 MG TABS    Sig: Take 1 tablet (20 mg total) by mouth every 8 (eight) hours as needed.    Dispense:  90 tablet    Refill:  0    Do not place this medication, or any other prescription from our practice, on "Automatic Refill". Patient may have prescription filled one day early if pharmacy is closed on  scheduled refill date. Do not fill until: 11/20/16 To last  until: 12/20/16  . fentaNYL (DURAGESIC - DOSED MCG/HR) 12 MCG/HR    Sig: Place 1 patch (12.5 mcg total) onto the skin every 3 (three) days.    Dispense:  10 patch    Refill:  0    Do not place this medication, or any other prescription from our practice, on "Automatic Refill". Patient may have prescription filled one day early if pharmacy is closed on scheduled refill date. Do not fill until: 09/21/16 To last until: 10/21/16  . fentaNYL (DURAGESIC - DOSED MCG/HR) 12 MCG/HR    Sig: Place 1 patch (12.5 mcg total) onto the skin every 3 (three) days.    Dispense:  10 patch    Refill:  0    Do not place this medication, or any other prescription from our practice, on "Automatic Refill". Patient may have prescription filled one day early if pharmacy is closed on scheduled refill date. Do not fill until: 10/21/16 To last until: 11/20/16  . fentaNYL (DURAGESIC - DOSED MCG/HR) 12 MCG/HR    Sig: Place 1 patch (12.5 mcg total) onto the skin every 3 (three) days.    Dispense:  10 patch    Refill:  0    Do not place this medication, or any other prescription from our practice, on "Automatic Refill". Patient may have prescription filled one day early if pharmacy is closed on scheduled refill date. Do not fill until: 11/20/16 To last until: 12/20/16   New Prescriptions   No medications on file   Medications administered today: Mr. Hoogendoorn had no medications administered during this visit. Lab-work, procedure(s), and/or referral(s): No orders of the defined types were placed in this encounter.  Imaging and/or referral(s): MR LUMBAR SPINE WO CONTRAST  Interventional therapies: Planned, scheduled, and/or pending:   None at this time.    Considering:   Right-sided cervical epidural steroid injection.  Right-sided L4-5 lumbar epidural steroid injection.  Diagnostic bilateral lumbar facet block. Possible bilateral lumbar facet  radiofrequency ablation. Diagnostic bilateral cervical facet block.  Possible bilateral cervical facet radiofrequency ablation.    Palliative PRN treatment(s):   For the arm pain and numbness, right-sided cervical epidural steroid injection under fluoroscopic guidance and IV sedation.  For the lower extremity pain, right-sided L4-5 lumbar epidural steroid injection under fluoroscopic guidance and IV sedation.  For the low back pain, diagnostic bilateral lumbar facet block under fluoroscopic guidance and IV sedation.  For the neck pain, diagnostic bilateral cervical facet block under fluoroscopic guidance and IV sedation.    Provider-requested follow-up: Return in about 3 months (around 12/12/2016) for Med-Mgmt, In addition, (PRN) Procedure.  Future Appointments Date Time Provider [place]  12/06/2016 8:00 AM Milinda Pointer, MD [facility] None  01/24/2017 10:30 AM [facility]   Primary Care Physician: [name], MD Location: [facility] Note by: Kathlen Brunswick. Dossie Arbour, M.D, DABA, DABAPM, DABPM, DABIPP, FIPP  Pain Score Disclaimer: We use the NRS-11 scale. This is a self-reported, subjective measurement of pain severity with only modest accuracy. It is used primarily to identify changes within a particular patient. It must be understood that outpatient pain scales are significantly less accurate that those used for research, where they can be applied under ideal controlled circumstances with minimal exposure to variables. In reality, the score is likely to be a combination of pain intensity and pain affect, where pain affect describes the degree of emotional arousal or changes in action readiness caused by the sensory experience of pain. Factors such as social  and work situation, setting, emotional state, anxiety levels, expectation, and prior pain experience may influence pain perception and show large inter-individual differences that may  also be affected by time variables.  Patient instructions provided during this appointment: Patient Instructions  Pain Management Discharge Instructions  General Discharge Instructions :  If you need to reach your doctor call: Monday-Friday 8:00 am - 4:00 pm at [phone] or toll free [phone].  After clinic hours [phone] to have operator reach doctor.  Bring all of your medication bottles to all your appointments in the pain clinic.  To cancel or reschedule your appointment with Pain Management please remember to call 24 hours in advance to avoid a fee.  Refer to the educational materials which you have been given on: General Risks, I had my Procedure. Discharge Instructions, Post Sedation.  Post Procedure Instructions:  The drugs you were given will stay in your system until tomorrow, so for the next 24 hours you should not drive, make any legal decisions or drink any alcoholic beverages.  You may eat anything you prefer, but it is better to start with liquids then soups and crackers, and gradually work up to solid foods.  Please notify your doctor immediately if you have any unusual bleeding, trouble breathing or pain that is not related to your normal pain.  Depending on the type of procedure that was done, some parts of your body may feel week and/or numb.  This usually clears up by tonight or the next day.  Walk with the use of an assistive device or accompanied by an adult for the 24 hours.  You may use ice on the affected area for the first 24 hours.  Put ice in a Ziploc bag and cover with a towel and place against area 15 minutes on 15 minutes off.  You may switch to heat after 24 hours.

## 2016-09-11 NOTE — Patient Instructions (Signed)

## 2016-09-11 NOTE — Progress Notes (Signed)
Nursing Pain Medication Assessment:  Safety precautions to be maintained throughout the outpatient stay will include: orient to surroundings, keep bed in low position, maintain call bell within reach at all times, provide assistance with transfer out of bed and ambulation.  Medication Inspection Compliance: Pill count conducted under aseptic conditions, in front of the patient. Neither the pills nor the bottle was removed from the patient's sight at any time. Once count was completed pills were immediately returned to the patient in their original bottle.  Medication #1: Oxycodone IR Pill Count: 47 of 90 pills remain Bottle Appearance: Standard pharmacy container. Clearly labeled. Filled Date: 64 / 24 / 2017 Medication last intake:09/11/16 at 5am  Medication #2: Duragesic Pill Count: 5 of 10 pills remain Bottle Appearance: Standard pharmacy container. Clearly labeled. Filled Date: 10 / 24 / 2017 Medication last intake: 09/10/16 1130pm

## 2016-09-14 DIAGNOSIS — G4733 Obstructive sleep apnea (adult) (pediatric): Secondary | ICD-10-CM | POA: Diagnosis not present

## 2016-10-04 DIAGNOSIS — M1990 Unspecified osteoarthritis, unspecified site: Secondary | ICD-10-CM | POA: Diagnosis not present

## 2016-10-14 DIAGNOSIS — G4733 Obstructive sleep apnea (adult) (pediatric): Secondary | ICD-10-CM | POA: Diagnosis not present

## 2016-11-01 DIAGNOSIS — Z79899 Other long term (current) drug therapy: Secondary | ICD-10-CM | POA: Diagnosis not present

## 2016-11-01 DIAGNOSIS — Z23 Encounter for immunization: Secondary | ICD-10-CM | POA: Diagnosis not present

## 2016-11-01 DIAGNOSIS — M150 Primary generalized (osteo)arthritis: Secondary | ICD-10-CM | POA: Diagnosis not present

## 2016-11-01 DIAGNOSIS — L409 Psoriasis, unspecified: Secondary | ICD-10-CM | POA: Diagnosis not present

## 2016-11-01 DIAGNOSIS — M1990 Unspecified osteoarthritis, unspecified site: Secondary | ICD-10-CM | POA: Diagnosis not present

## 2016-11-13 DIAGNOSIS — G4733 Obstructive sleep apnea (adult) (pediatric): Secondary | ICD-10-CM | POA: Diagnosis not present

## 2016-11-14 DIAGNOSIS — G4733 Obstructive sleep apnea (adult) (pediatric): Secondary | ICD-10-CM | POA: Diagnosis not present

## 2016-11-16 DIAGNOSIS — S8012XA Contusion of left lower leg, initial encounter: Secondary | ICD-10-CM | POA: Diagnosis not present

## 2016-11-16 DIAGNOSIS — S8002XA Contusion of left knee, initial encounter: Secondary | ICD-10-CM | POA: Diagnosis not present

## 2016-11-16 DIAGNOSIS — M1712 Unilateral primary osteoarthritis, left knee: Secondary | ICD-10-CM | POA: Diagnosis not present

## 2016-11-19 ENCOUNTER — Other Ambulatory Visit: Payer: Self-pay | Admitting: Neurological Surgery

## 2016-11-19 DIAGNOSIS — R200 Anesthesia of skin: Secondary | ICD-10-CM

## 2016-11-19 DIAGNOSIS — M47816 Spondylosis without myelopathy or radiculopathy, lumbar region: Secondary | ICD-10-CM | POA: Diagnosis not present

## 2016-11-26 ENCOUNTER — Other Ambulatory Visit (HOSPITAL_COMMUNITY): Payer: Self-pay

## 2016-11-29 ENCOUNTER — Ambulatory Visit: Payer: PPO

## 2016-12-06 ENCOUNTER — Encounter: Payer: Self-pay | Admitting: Pain Medicine

## 2016-12-06 ENCOUNTER — Other Ambulatory Visit: Payer: Self-pay | Admitting: Gastroenterology

## 2016-12-06 ENCOUNTER — Other Ambulatory Visit
Admission: RE | Admit: 2016-12-06 | Discharge: 2016-12-06 | Disposition: A | Discharge status: Home / Self Care | Payer: PPO | Source: Ambulatory Visit | Attending: Pain Medicine | Admitting: Pain Medicine

## 2016-12-06 ENCOUNTER — Ambulatory Visit: Payer: PPO | Attending: Pain Medicine | Admitting: Pain Medicine

## 2016-12-06 VITALS — BP 139/87 | HR 67 | Temp 98.7°F | Resp 16 | Ht 72.0 in | Wt 247.0 lb

## 2016-12-06 DIAGNOSIS — R1310 Dysphagia, unspecified: Secondary | ICD-10-CM | POA: Diagnosis not present

## 2016-12-06 DIAGNOSIS — Z87891 Personal history of nicotine dependence: Secondary | ICD-10-CM | POA: Insufficient documentation

## 2016-12-06 DIAGNOSIS — E039 Hypothyroidism, unspecified: Secondary | ICD-10-CM | POA: Diagnosis not present

## 2016-12-06 DIAGNOSIS — E119 Type 2 diabetes mellitus without complications: Secondary | ICD-10-CM | POA: Diagnosis not present

## 2016-12-06 DIAGNOSIS — F1190 Opioid use, unspecified, uncomplicated: Secondary | ICD-10-CM

## 2016-12-06 DIAGNOSIS — R109 Unspecified abdominal pain: Secondary | ICD-10-CM

## 2016-12-06 DIAGNOSIS — M25552 Pain in left hip: Secondary | ICD-10-CM | POA: Insufficient documentation

## 2016-12-06 DIAGNOSIS — G894 Chronic pain syndrome: Secondary | ICD-10-CM | POA: Insufficient documentation

## 2016-12-06 DIAGNOSIS — M5416 Radiculopathy, lumbar region: Secondary | ICD-10-CM | POA: Diagnosis not present

## 2016-12-06 DIAGNOSIS — M79605 Pain in left leg: Secondary | ICD-10-CM | POA: Insufficient documentation

## 2016-12-06 DIAGNOSIS — M25551 Pain in right hip: Secondary | ICD-10-CM | POA: Insufficient documentation

## 2016-12-06 DIAGNOSIS — Z79899 Other long term (current) drug therapy: Secondary | ICD-10-CM | POA: Diagnosis not present

## 2016-12-06 DIAGNOSIS — M25561 Pain in right knee: Secondary | ICD-10-CM | POA: Insufficient documentation

## 2016-12-06 DIAGNOSIS — R040 Epistaxis: Secondary | ICD-10-CM | POA: Diagnosis not present

## 2016-12-06 DIAGNOSIS — I4891 Unspecified atrial fibrillation: Secondary | ICD-10-CM | POA: Diagnosis not present

## 2016-12-06 DIAGNOSIS — R634 Abnormal weight loss: Secondary | ICD-10-CM | POA: Diagnosis not present

## 2016-12-06 DIAGNOSIS — Z79891 Long term (current) use of opiate analgesic: Secondary | ICD-10-CM

## 2016-12-06 DIAGNOSIS — G8929 Other chronic pain: Secondary | ICD-10-CM

## 2016-12-06 DIAGNOSIS — M545 Low back pain, unspecified: Secondary | ICD-10-CM

## 2016-12-06 DIAGNOSIS — Z5181 Encounter for therapeutic drug level monitoring: Secondary | ICD-10-CM | POA: Insufficient documentation

## 2016-12-06 DIAGNOSIS — M25562 Pain in left knee: Secondary | ICD-10-CM | POA: Insufficient documentation

## 2016-12-06 DIAGNOSIS — R1013 Epigastric pain: Secondary | ICD-10-CM | POA: Diagnosis not present

## 2016-12-06 DIAGNOSIS — D649 Anemia, unspecified: Secondary | ICD-10-CM | POA: Diagnosis not present

## 2016-12-06 DIAGNOSIS — K224 Dyskinesia of esophagus: Secondary | ICD-10-CM | POA: Diagnosis not present

## 2016-12-06 DIAGNOSIS — M79604 Pain in right leg: Secondary | ICD-10-CM | POA: Diagnosis not present

## 2016-12-06 DIAGNOSIS — Z880 Allergy status to penicillin: Secondary | ICD-10-CM | POA: Diagnosis not present

## 2016-12-06 DIAGNOSIS — G5641 Causalgia of right upper limb: Secondary | ICD-10-CM

## 2016-12-06 LAB — COMPREHENSIVE METABOLIC PANEL
ALT: 33 U/L (ref 17–63)
ANION GAP: 6 (ref 5–15)
AST: 35 U/L (ref 15–41)
Albumin: 4.4 g/dL (ref 3.5–5.0)
Alkaline Phosphatase: 47 U/L (ref 38–126)
BILIRUBIN TOTAL: 1.6 mg/dL — AB (ref 0.3–1.2)
BUN: 14 mg/dL (ref 6–20)
CHLORIDE: 103 mmol/L (ref 101–111)
CO2: 30 mmol/L (ref 22–32)
Calcium: 9.6 mg/dL (ref 8.9–10.3)
Creatinine, Ser: 1.26 mg/dL — ABNORMAL HIGH (ref 0.61–1.24)
Glucose, Bld: 119 mg/dL — ABNORMAL HIGH (ref 65–99)
POTASSIUM: 4.6 mmol/L (ref 3.5–5.1)
Sodium: 139 mmol/L (ref 135–145)
TOTAL PROTEIN: 7.5 g/dL (ref 6.5–8.1)

## 2016-12-06 LAB — SEDIMENTATION RATE: SED RATE: 9 mm/h (ref 0–20)

## 2016-12-06 LAB — MAGNESIUM: Magnesium: 2.2 mg/dL (ref 1.7–2.4)

## 2016-12-06 LAB — C-REACTIVE PROTEIN

## 2016-12-06 LAB — VITAMIN B12: Vitamin B-12: 423 pg/mL (ref 180–914)

## 2016-12-06 MED ORDER — FENTANYL 12 MCG/HR TD PT72: 12.5000 ug | MEDICATED_PATCH | TRANSDERMAL | 0 refills | Status: DC

## 2016-12-06 MED ORDER — OXYCODONE HCL 20 MG PO TABS: 20.0000 mg | ORAL_TABLET | Freq: Three times a day (TID) | ORAL | 0 refills | Status: DC | PRN

## 2016-12-06 MED ORDER — OXYCODONE HCL 20 MG PO TABS: 20.0000 mg | ORAL_TABLET | Freq: Three times a day (TID) | ORAL | 0 refills | Status: DC

## 2016-12-06 NOTE — Progress Notes (Signed)
Nursing Pain Medication Assessment:  Safety precautions to be maintained throughout the outpatient stay will include: orient to surroundings, keep bed in low position, maintain call bell within reach at all times, provide assistance with transfer out of bed and ambulation.  Medication Inspection Compliance: Pill count conducted under aseptic conditions, in front of the patient. Neither the pills nor the bottle was removed from the patient's sight at any time. Once count was completed pills were immediately returned to the patient in their original bottle.  Medication #1: Fentanyl patch Pill/Patch Count: 8 of 10 pills remain Bottle Appearance: Standard pharmacy container. Clearly labeled. Filled Date: 01 / 29 / 2018 Last Medication intake:  Yesterday  Medication #2: Oxycodone IR Pill/Patch Count: 81 of 90 pills remain Bottle Appearance: Standard pharmacy container. Clearly labeled. Filled Date: 01 /29 / 2018 Last Medication intake:  Today

## 2016-12-06 NOTE — Progress Notes (Signed)
Patient's Name: James [name]  MRN: [id number]  Referring Provider: [name], MD  DOB: 1955-12-05  PCP: [name], MD  DOS: 12/06/2016  Note by: Kathlen Brunswick. Dossie Arbour, MD  Service setting: Ambulatory outpatient  Specialty: Interventional Pain Management  Location: [facility] (AMB) Pain Management Facility    Patient type: Established   Primary Reason(s) for Visit: Encounter for prescription drug management (Level of risk: moderate) CC: Back Pain (upper, middle, lower); Hip Pain (bilateral); and Knee Pain (bilateral)  HPI  James [name] is a 61 y.o. year old, male patient, who comes today for a medication management evaluation. He has Airway hyperreactivity; Atrial fibrillation ([place]); Narrowing of intervertebral disc space; Abnormal echocardiogram; Gastric ulcer; Gastric catarrh; Acid reflux; Awareness of heartbeats; Hemorrhoid; Hepatitis; BP (high blood pressure); Lactose intolerance; Osteopenia; APC (atrial premature contractions); Apnea, sleep; Supraventricular tachycardia ([place]); Long term current use of opiate analgesic; Long term prescription opiate use; Opiate use (120 MME/Day); Opiate dependence ([place]); Encounter for therapeutic drug level monitoring; Chronic low back pain (Location of Primary Source of Pain) (Bilateral) (R>L); Lumbar spondylosis; Chronic neck pain (Right); Cervical spondylosis (C7 intravertebral body cyst); Chronic cervical radicular pain (Right); Chronic lumbar radicular pain (Location of Secondary source of pain) (Bilateral) (L>R) (L5 Dermatome); Osteoarthritis; Chronic hip pain; Chronic knee pain (Bilateral) (R>L); Complex regional pain syndrome type II of upper extremity (Right); Chronic upper extremity pain (Right); 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 Raquel Sarna); Epidural fibrosis; Musculoskeletal pain; Neurogenic pain; Neuropathic pain; Chronic sacroiliac joint pain (Bilateral) (R>L); Psoriatic arthritis ([place]); Controlled type 2 diabetes mellitus without complication ([place]); Chest pain; Acquired hypothyroidism; Chronic lower extremity pain (Location of Secondary source of pain) (Bilateral) (L>R); Psoriasis with arthropathy ([place]); Hypothyroidism; Numbness and tingling of both legs; Abnormal nerve conduction studies (06/20/16); and Chronic pain syndrome on his problem list. His primarily concern today is the Back Pain (upper, middle, lower); Hip Pain (bilateral); and Knee Pain (bilateral)  Pain Assessment: Self-Reported Pain Score: 5 /10             Reported level is compatible with observation.       Pain Type: Chronic pain Pain Location: Back Pain Orientation: Lower Pain Descriptors / Indicators: Burning, Throbbing Pain Frequency: Constant  James [name] was last seen on 09/11/2016 for medication management. During today's appointment we reviewed James [name] chronic pain status, as well as his outpatient medication regimen. The patient is in the middle of changing his primary care physician. He is also pending to have a cervical and a lumbar MRI next Thursday. He is loosing weight and feeling extremely sick due to esophageal problems. He is currently being worked up for this. He has been having problems with anemia and nosebleeds. Clearly he has some medical issues that need to be worked up by his primary care physician. He indicates having several specialists looking at him. He is seeing a rheumatologist, a gastroenterologist, and a neurosurgeon. He has been experiencing more numbness and weakness of the upper extremities and he has been having more falls.  The patient  reports that he does not use drugs. His body mass index is 33.5 kg/m.  Further details on both, my assessment(s), as well as the proposed  treatment plan, please see below.  Controlled Substance Pharmacotherapy Assessment REMS (Risk Evaluation and Mitigation Strategy)  Analgesic:Fentanyl patch 12.5 mcg/h  every 72 hours + oxycodone IR 20 mg every 6 hours (60 mg/day) MME/day:120 mg/day Landis Martins, RN  12/06/2016  8:11 AM  Sign at close encounter Nursing Pain Medication Assessment:  Safety precautions to be maintained throughout the outpatient stay will include: orient to surroundings, keep bed in low position, maintain call bell within reach at all times, provide assistance with transfer out of bed and ambulation.  Medication Inspection Compliance: Pill count conducted under aseptic conditions, in front of the patient. Neither the pills nor the bottle was removed from the patient's sight at any time. Once count was completed pills were immediately returned to the patient in their original bottle.  Medication #1: Fentanyl patch Pill/Patch Count: 8 of 10 pills remain Bottle Appearance: Standard pharmacy container. Clearly labeled. Filled Date: 01 / 29 / 2018 Last Medication intake:  Yesterday  Medication #2: Oxycodone IR Pill/Patch Count: 81 of 90 pills remain Bottle Appearance: Standard pharmacy container. Clearly labeled. Filled Date: 01 /29 / 2018 Last Medication intake:  Today   Pharmacokinetics: Liberation and absorption (onset of action): WNL Distribution (time to peak effect): WNL Metabolism and excretion (duration of action): WNL         Pharmacodynamics: Desired effects: Analgesia: James [name] reports >50% benefit. Functional ability: Patient reports that medication allows him to accomplish basic ADLs Clinically meaningful improvement in function (CMIF): Sustained CMIF goals met Perceived effectiveness: Described as relatively effective, allowing for increase in activities of daily living (ADL) Undesirable effects: Side-effects or Adverse reactions: None reported Monitoring: [place] PMP: Online review of the  past 66-monthperiod conducted. Compliant with practice rules and regulations List of all UDS test(s) done:  Lab Results  Component Value Date   TOXASSSELUR FINAL 03/07/2016   TOXASSSELUR FINAL 12/12/2015   TOXASSSELUR FINAL 09/12/2015   Last UDS on record: ToxAssure Select 13  Date Value Ref Range Status  03/07/2016 FINAL  Final    Comment:    ==================================================================== TOXASSURE SELECT 13 (MW) ==================================================================== Test                             Result       Flag       Units Drug Present and Declared for Prescription Verification   Oxycodone                      1784         EXPECTED   ng/mg creat   Oxymorphone                    983          EXPECTED   ng/mg creat   Noroxycodone                   >3876        EXPECTED   ng/mg creat   Noroxymorphone                 418          EXPECTED   ng/mg creat    Sources of oxycodone are scheduled prescription medications.    Oxymorphone, noroxycodone, and noroxymorphone are expected    metabolites of oxycodone. Oxymorphone is also available as a    scheduled prescription medication.   Fentanyl                       2  EXPECTED   ng/mg creat   Norfentanyl                    47           EXPECTED   ng/mg creat    Source of fentanyl is a scheduled prescription medication,    including IV, patch, and transmucosal formulations. Norfentanyl    is an expected metabolite of fentanyl. Drug Absent but Declared for Prescription Verification   Temazepam                      Not Detected UNEXPECTED ng/mg creat ==================================================================== Test                      Result    Flag   Units      Ref Range   Creatinine              258              mg/dL      >=20 ==================================================================== Declared Medications:  The flagging and interpretation on this report are based on  the  following declared medications.  Unexpected results may arise from  inaccuracies in the declared medications.  **Note: The testing scope of this panel includes these medications:  Fentanyl (Duragesic)  Oxycodone  Temazepam (Restoril)  **Note: The testing scope of this panel does not include following  reported medications:  Albuterol (Combivent)  Albuterol (Proventil)  Budenoside (Symbicort)  Cyanocobalamin  Folic acid (Folvite)  Formoterol (Symbicort)  Ipratropium (Combivent)  Levothyroxine (Synthroid)  Loratadine  Methotrexate  Metoprolol  Multivitamin (MVI)  Oxymetazoline (Afrin)  Pantoprazole (Protonix)  Prednisone (Deltasone)  Sucralfate (Carafate)  Testosterone (Depo-Testosterone)  Tizanidine (Zanaflex)  Vitamin C  Vitamin D3 ==================================================================== For clinical consultation, please call [phone]. ====================================================================    UDS interpretation: Compliant          Medication Assessment Form: Reviewed. Patient indicates being compliant with therapy Treatment compliance: Compliant Risk Assessment Profile: Aberrant behavior: See prior evaluations. None observed or detected today Comorbid factors increasing risk of overdose: See prior notes. No additional risks detected today Risk of substance use disorder (SUD): Low Opioid Risk Tool (ORT) Total Score: 0  Interpretation Table:  Score <3 = Low Risk for SUD  Score between 4-7 = Moderate Risk for SUD  Score >8 = High Risk for Opioid Abuse   Risk Mitigation Strategies:  Patient Counseling: Covered Patient-Prescriber Agreement (PPA): Present and active  Notification to other healthcare providers: Done  Pharmacologic Plan: No change in therapy, at this time  Laboratory Chemistry  Inflammation Markers Lab Results  Component Value Date   ESRSEDRATE 4 12/12/2015   CRP <0.5 12/12/2015   Renal Function Lab Results   Component Value Date   BUN 13 12/12/2015   CREATININE 1.19 12/12/2015   GFRAA >60 12/12/2015   GFRNONAA >60 12/12/2015   Hepatic Function Lab Results  Component Value Date   AST 30 12/12/2015   ALT 31 12/12/2015   ALBUMIN 4.5 12/12/2015   Electrolytes Lab Results  Component Value Date   NA 138 12/12/2015   K 4.4 12/12/2015   CL 103 12/12/2015   CALCIUM 9.7 12/12/2015   MG 2.1 12/12/2015   Pain Modulating Vitamins No results found for: [place], VD125OH2TOT, [id number], NL9767HA1, 25OHVITD1, 25OHVITD2, 25OHVITD3, VITAMINB12 Coagulation Parameters No results found for: INR, LABPROT, APTT, PLT Cardiovascular No results found for: BNP, HGB, HCT Note: Lab results reviewed.  Recent Diagnostic  Imaging Review  Ct Chest Wo Contrast Result Date: 01/25/2016 CLINICAL DATA:  Difficulty breathing over the past 6 to 8 months. History of multiple episodes bronchitis. EXAM: CT CHEST WITHOUT CONTRAST TECHNIQUE: Multidetector CT imaging of the chest was performed following the standard protocol without IV contrast. COMPARISON:  None. FINDINGS: Mediastinum/Nodes: No chest wall, supraclavicular or axillary lymphadenopathy. Thyroid gland is grossly normal. The heart is normal in size. No pericardial effusion. Three-vessel coronary artery calcifications are noted. The aorta is normal in caliber. Scattered atherosclerotic calcifications at the aortic arch. No mediastinal or hilar mass or adenopathy. The esophagus is grossly normal. Lungs/Pleura: Mild emphysematous changes are noted. No pulmonary infiltrates or pleural effusion. Small scattered pulmonary nodules are noted. These are likely benign but will require followup. 2-3 mm nodules are noted in the left upper lobe on image 19. Small calcified granuloma is noted in the superior segment of the left lower lobe on image number 22. Small calcified granuloma noted in the left lower lobe on image number 34. 5 mm nodule in the lingula on image number 34. Sub 3  mm pulmonary nodule in the right upper lobe on image number 26. Two 3.5 mm nodules in the right lower lobe on image number 30. Upper abdomen: No significant findings. Musculoskeletal: No significant findings. IMPRESSION: 1. Multiple small bilateral pulmonary nodules as described above. Some of these are calcified granulomas. The other lesions are likely benign but I would recommend a followup noncontrast chest CT in 12 months to document stability. 2. No mediastinal or hilar mass or adenopathy. 3. Mild emphysematous changes and pulmonary scarring. 4. Three-vessel coronary artery calcifications, advanced for age. Electronically Signed   By: Marijo Sanes M.D.   On: 01/25/2016 11:18   Note: Imaging results reviewed.          Meds  The patient has a current medication list which includes the following prescription(s): albuterol, azelastine, budesonide-formoterol, fentanyl, fentanyl, fentanyl, levothyroxine, loratadine, methotrexate, metoprolol, oxycodone hcl, oxycodone hcl, oxycodone hcl, promethazine, sucralfate, temazepam, testosterone cypionate, and tizanidine.  Current Outpatient Prescriptions on File Prior to Visit  Medication Sig  . albuterol (PROAIR HFA) 108 (90 BASE) MCG/ACT inhaler Inhale 2 puffs into the lungs every 4 (four) hours as needed for wheezing or shortness of breath.   Marland Kitchen azelastine (ASTELIN) 0.1 % nasal spray Place 1 spray into both nostrils 2 (two) times daily.   . budesonide-formoterol (SYMBICORT) 80-4.5 MCG/ACT inhaler Inhale 2 puffs into the lungs 2 (two) times daily.  Marland Kitchen levothyroxine (SYNTHROID, LEVOTHROID) 25 MCG tablet TAKE 1 TABLET EVERY MORNING ON AN EMPTY STOMACH WITH A GLASS OF WATERAT LEAST 30 TO 60 MINUTES BEFORE BREAKFAST  . Loratadine 10 MG CAPS Take 10 mg by mouth daily.   . methotrexate 50 MG/2ML injection Inject 50 mg/m2 as directed once a week.   . metoprolol (LOPRESSOR) 100 MG tablet TAKE 1 TABLET BY MOUTH TWICE A DAY  . promethazine (PHENERGAN) 12.5 MG tablet  Take by mouth every 4 (four) hours as needed.   . sucralfate (CARAFATE) 1 G tablet Take 1 g by mouth 4 (four) times daily.   . temazepam (RESTORIL) 15 MG capsule Take 15 mg by mouth at bedtime as needed for sleep.  Marland Kitchen testosterone cypionate (DEPOTESTOSTERONE CYPIONATE) 200 MG/ML injection INJECT 1 ML INTRAMUSCULARY EVERY 2 WEEKS  . tiZANidine (ZANAFLEX) 4 MG capsule Take 4 mg by mouth 3 (three) times daily as needed for muscle spasms.   No current facility-administered medications on file prior to visit.  ROS  Constitutional: Denies any fever or chills Gastrointestinal: No reported hemesis, hematochezia, vomiting, or acute GI distress Musculoskeletal: Denies any acute onset joint swelling, redness, loss of ROM, or weakness Neurological: No reported episodes of acute onset apraxia, aphasia, dysarthria, agnosia, amnesia, paralysis, loss of coordination, or loss of consciousness  Allergies  Mr. Emert is allergic to penicillins; lactose; nsaids; prednisone; procaine; sulfa antibiotics; sulfasalazine; talwin [pentazocine]; and codeine.  [place]  Drug: Mr. Beasley  reports that he does not use drugs. Alcohol:  reports that he does not drink alcohol. Tobacco:  reports that he has quit smoking. His smoking use included Cigarettes. His smokeless tobacco use includes Chew. 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 Family: family history includes Arthritis in his father and mother; COPD in his father; Cancer in  his father and mother; Diabetes in his mother; Hypertension in his father and mother; Stroke in his mother.  Past Surgical History:  Procedure Laterality Date  . ESOPHAGOGASTRODUODENOSCOPY (EGD) WITH PROPOFOL N/A 01/20/2016   Procedure: ESOPHAGOGASTRODUODENOSCOPY (EGD) WITH PROPOFOL;  Surgeon: Lollie Sails, MD;  [facility] ENDOSCOPY;  Service: Endoscopy;  Laterality: N/A;  . EYE SURGERY     removal of foreign object  . FRACTURE SURGERY Right    arm  . FRACTURE SURGERY Bilateral    fingers  . FRACTURE SURGERY Bilateral    wrist  . JOINT REPLACEMENT Right    radial head  . PATELLA ARTHROPLASTY    . SHOULDER ARTHROSCOPY Bilateral   . SPINAL CORD STIMULATOR IMPLANT    . SPINAL CORD STIMULATOR REMOVAL    . SPINE SURGERY     cervical and thoracic and lumbar   Constitutional Exam  General appearance: Well nourished, well developed, and well hydrated. In no apparent acute distress Vitals:   12/06/16 0802  BP: 139/87  Pulse: 67  Resp: 16  Temp: 98.7 F (37.1 C)  TempSrc: Oral  SpO2: (!) 65%  Weight: 247 lb (112 kg)  Height: 6' (1.829 m)   BMI Assessment: Estimated body mass index is 33.5 kg/m as calculated from the following:   Height as of this encounter: 6' (1.829 m).   Weight as of this encounter: 247 lb (112 kg).  BMI interpretation table: BMI level Category Range association with higher incidence of chronic pain  <18 kg/m2 Underweight   18.5-24.9 kg/m2 Ideal body weight   25-29.9 kg/m2 Overweight Increased incidence by 20%  30-34.9 kg/m2 Obese (Class I) Increased incidence by 68%  35-39.9 kg/m2 Severe obesity (Class II) Increased incidence by 136%  >40 kg/m2 Extreme obesity (Class III) Increased incidence by 254%   BMI Readings from Last 4 Encounters:  12/06/16 33.50 kg/m  09/11/16 35.53 kg/m  07/12/16 36.96 kg/m  06/26/16 36.26 kg/m   Wt Readings from Last 4 Encounters:  12/06/16 247 lb (112 kg)  09/11/16 262 lb (118.8 kg)  07/12/16 265 lb (120.2  kg)  06/26/16 260 lb (117.9 kg)  Psych/Mental status: Alert, oriented x 3 (person, place, & time)       Eyes: PERLA Respiratory: No evidence of acute respiratory distress  Cervical Spine Exam  Inspection: Well healed scar from previous spine surgery detected Alignment: Symmetrical  Functional ROM: Decreased ROM Stability: No instability detected Muscle strength & Tone: Functionally intact Sensory: Movement-associated pain Palpation: Complains of area being tender to palpation  Upper Extremity (UE) Exam    Side: Right upper extremity  Side: Left upper extremity  Inspection: No masses, redness, swelling, or asymmetry  Inspection: No masses, redness, swelling, or asymmetry  Functional ROM: Unrestricted ROM          Functional ROM: Unrestricted ROM          Muscle strength & Tone: Functionally intact  Muscle strength & Tone: Functionally intact  Sensory: Unimpaired  Sensory: Unimpaired  Palpation: Non-contributory  Palpation: Non-contributory   Thoracic Spine Exam  Inspection: No masses, redness, or swelling Alignment: Symmetrical Functional ROM: Unrestricted ROM Stability: No instability detected Sensory: Unimpaired Muscle strength & Tone: Functionally intact Palpation: Non-contributory  Lumbar Spine Exam  Inspection: No masses, redness, or swelling Alignment: Symmetrical Functional ROM: Decreased ROM Stability: No instability detected Muscle strength & Tone: Functionally intact Sensory: Movement-associated pain Palpation: Complains of area being tender to palpation Provocative Tests: Lumbar Hyperextension and rotation test: evaluation deferred today       Patrick's Maneuver: evaluation deferred today              Gait & Posture Assessment  Ambulation: Unassisted Gait: Relatively normal for age and body habitus Posture: WNL   Lower Extremity Exam    Side: Right lower extremity  Side: Left lower extremity  Inspection: No masses, redness, swelling, or asymmetry   Inspection: No masses, redness, swelling, or asymmetry  Functional ROM: Unrestricted ROM          Functional ROM: Unrestricted ROM          Muscle strength & Tone: Functionally intact  Muscle strength & Tone: Functionally intact  Sensory: Unimpaired  Sensory: Unimpaired  Palpation: Non-contributory  Palpation: Non-contributory   Assessment  Primary Diagnosis & Pertinent Problem List: The primary encounter diagnosis was Chronic pain syndrome. Diagnoses of Chronic low back pain (Location of Primary Source of Pain) (Bilateral) (R>L), Chronic lower extremity pain (Location of Secondary source of pain) (Bilateral) (L>R), Chronic lumbar radicular pain (Location of Secondary source of pain) (Bilateral) (L>R) (L5 Dermatome), Complex regional pain syndrome type II of upper extremity (Right), Long term current use of opiate analgesic, and Opiate use (120 MME/Day) were also pertinent to this visit.  Status Diagnosis  Controlled Controlled Controlled 1. Chronic pain syndrome   2. Chronic low back pain (Location of Primary Source of Pain) (Bilateral) (R>L)   3. Chronic lower extremity pain (Location of Secondary source of pain) (Bilateral) (L>R)   4. Chronic lumbar radicular pain (Location of Secondary source of pain) (Bilateral) (L>R) (L5 Dermatome)   5. Complex regional pain syndrome type II of upper extremity (Right)   6. Long term current use of opiate analgesic   7. Opiate use (120 MME/Day)      Plan of Care  Pharmacotherapy (Medications Ordered): Meds ordered this encounter  Medications  . Oxycodone HCl 20 MG TABS    Sig: Take 1 tablet (20 mg total) by mouth every 8 (eight) hours as needed.    Dispense:  90 tablet    Refill:  0    Do not place this medication, or any other prescription from our practice, on "Automatic Refill". Patient may have prescription filled one day early if pharmacy is closed on scheduled refill date. Do not fill until: 12/20/16 To last until: 01/19/17  . Oxycodone  HCl 20 MG TABS  Sig: Take 1 tablet (20 mg total) by mouth every 8 (eight) hours.    Dispense:  90 tablet    Refill:  0    Do not place this medication, or any other prescription from our practice, on "Automatic Refill". Patient may have prescription filled one day early if pharmacy is closed on scheduled refill date. Do not fill until: 01/19/17 To last until: 02/18/17  . Oxycodone HCl 20 MG TABS    Sig: Take 1 tablet (20 mg total) by mouth every 8 (eight) hours as needed.    Dispense:  90 tablet    Refill:  0    Do not place this medication, or any other prescription from our practice, on "Automatic Refill". Patient may have prescription filled one day early if pharmacy is closed on scheduled refill date. Do not fill until: 02/18/17 To last until: 03/20/17  . fentaNYL (DURAGESIC - DOSED MCG/HR) 12 MCG/HR    Sig: Place 1 patch (12.5 mcg total) onto the skin every 3 (three) days.    Dispense:  10 patch    Refill:  0    Do not place this medication, or any other prescription from our practice, on "Automatic Refill". Patient may have prescription filled one day early if pharmacy is closed on scheduled refill date. Do not fill until: 12/20/16 To last until: 01/19/17  . fentaNYL (DURAGESIC - DOSED MCG/HR) 12 MCG/HR    Sig: Place 1 patch (12.5 mcg total) onto the skin every 3 (three) days.    Dispense:  10 patch    Refill:  0    Do not place this medication, or any other prescription from our practice, on "Automatic Refill". Patient may have prescription filled one day early if pharmacy is closed on scheduled refill date. Do not fill until: 01/19/17 To last until: 02/18/17  . fentaNYL (DURAGESIC - DOSED MCG/HR) 12 MCG/HR    Sig: Place 1 patch (12.5 mcg total) onto the skin every 3 (three) days.    Dispense:  10 patch    Refill:  0    Do not place this medication, or any other prescription from our practice, on "Automatic Refill". Patient may have prescription filled one day early if  pharmacy is closed on scheduled refill date. Do not fill until: 02/18/17 To last until: 03/20/17   New Prescriptions   No medications on file   Medications administered today: Mr. Retherford had no medications administered during this visit. Lab-work, procedure(s), and/or referral(s): Orders Placed This Encounter  Procedures  . ToxASSURE Select 13 (MW), Urine  . Comprehensive metabolic panel  . C-reactive protein  . Magnesium  . Sedimentation rate  . Vitamin B12  . 25-Hydroxyvitamin D Lcms D2+D3   Imaging and/or referral(s): None  Interventional therapies: Planned, scheduled, and/or pending:   Not at this time.   Considering:   Right-sided cervical epidural steroid injection.  Right-sided L4-5 lumbar epidural steroid injection.  Diagnostic bilateral lumbar facet block. Possible bilateral lumbar facet radiofrequency ablation. Diagnostic bilateral cervical facet block.  Possible bilateral cervical facet radiofrequency ablation.   Palliative PRN treatment(s):   For the arm pain and numbness, right-sided cervical epidural steroid injectionunder fluoroscopic guidance and IV sedation.  For the lower extremity pain, right-sided L4-5 lumbar epidural steroid injectionunder fluoroscopic guidance and IV sedation.  For the low back pain, diagnostic bilateral lumbar facet blockunder fluoroscopic guidance and IV sedation.  For the neck pain, diagnostic bilateral cervical facet blockunder fluoroscopic guidance and IV sedation.    Provider-requested follow-up:  Return in about 3 months (around 03/05/2017) for (MD) Med-Mgmt, in addition, (PRN) procedure.  Future Appointments Date Time Provider [place]  12/13/2016 1:00 PM [facility] [place]  12/13/2016 2:00 PM [facility] [place]  12/13/2016 3:00 PM [facility]  01/24/2017 10:30 AM [facility]  03/07/2017 8:00 AM Milinda Pointer, MD [facility] None   Primary Care Physician: [name],  MD Location: [facility] Note by: Kathlen Brunswick. Dossie Arbour, M.D, DABA, DABAPM, DABPM, DABIPP, FIPP Date: 12/06/2016; Time: 9:04 AM  Pain Score Disclaimer: We use the NRS-11 scale. This is a self-reported, subjective measurement of pain severity with only modest accuracy. It is used primarily to identify changes within a particular patient. It must be understood that outpatient pain scales are significantly less accurate that those used for research, where they can be applied under ideal controlled circumstances with minimal exposure to variables. In reality, the score is likely to be a combination of pain intensity and pain affect, where pain affect describes the degree of emotional arousal or changes in action readiness caused by the sensory experience of pain. Factors such as social and work situation, setting, emotional state, anxiety levels, expectation, and prior pain experience may influence pain perception and show large inter-individual differences that may also be affected by time variables.  Patient instructions provided during this appointment: There are no Patient Instructions on file for this visit.

## 2016-12-09 LAB — 25-HYDROXY VITAMIN D LCMS D2+D3
25-Hydroxy, Vitamin D-2: <1 ng/mL
25-Hydroxy, Vitamin D-3: 42 ng/mL

## 2016-12-09 LAB — 25-HYDROXYVITAMIN D LCMS D2+D3: 25-HYDROXY, VITAMIN D: 42 ng/mL

## 2016-12-12 LAB — TOXASSURE SELECT 13 (MW), URINE

## 2016-12-13 ENCOUNTER — Ambulatory Visit
Admission: RE | Admit: 2016-12-13 | Discharge: 2016-12-13 | Disposition: A | Discharge status: Home / Self Care | Payer: PPO | Source: Ambulatory Visit | Attending: Neurological Surgery | Admitting: Neurological Surgery

## 2016-12-13 DIAGNOSIS — M48061 Spinal stenosis, lumbar region without neurogenic claudication: Secondary | ICD-10-CM | POA: Insufficient documentation

## 2016-12-13 DIAGNOSIS — R200 Anesthesia of skin: Secondary | ICD-10-CM

## 2016-12-13 DIAGNOSIS — M4802 Spinal stenosis, cervical region: Secondary | ICD-10-CM | POA: Insufficient documentation

## 2016-12-13 DIAGNOSIS — M5126 Other intervertebral disc displacement, lumbar region: Secondary | ICD-10-CM | POA: Insufficient documentation

## 2016-12-13 DIAGNOSIS — M47892 Other spondylosis, cervical region: Secondary | ICD-10-CM | POA: Insufficient documentation

## 2016-12-13 DIAGNOSIS — M47896 Other spondylosis, lumbar region: Secondary | ICD-10-CM | POA: Insufficient documentation

## 2016-12-13 DIAGNOSIS — M47812 Spondylosis without myelopathy or radiculopathy, cervical region: Secondary | ICD-10-CM | POA: Diagnosis not present

## 2016-12-15 DIAGNOSIS — G4733 Obstructive sleep apnea (adult) (pediatric): Secondary | ICD-10-CM | POA: Diagnosis not present

## 2016-12-17 ENCOUNTER — Ambulatory Visit
Admission: RE | Admit: 2016-12-17 | Discharge: 2016-12-17 | Disposition: A | Discharge status: Home / Self Care | Payer: PPO | Source: Ambulatory Visit | Attending: Gastroenterology | Admitting: Gastroenterology

## 2016-12-17 DIAGNOSIS — N281 Cyst of kidney, acquired: Secondary | ICD-10-CM | POA: Insufficient documentation

## 2016-12-17 DIAGNOSIS — R109 Unspecified abdominal pain: Secondary | ICD-10-CM | POA: Diagnosis not present

## 2016-12-17 DIAGNOSIS — R634 Abnormal weight loss: Secondary | ICD-10-CM | POA: Insufficient documentation

## 2016-12-17 DIAGNOSIS — R93422 Abnormal radiologic findings on diagnostic imaging of left kidney: Secondary | ICD-10-CM | POA: Diagnosis not present

## 2016-12-17 DIAGNOSIS — R1110 Vomiting, unspecified: Secondary | ICD-10-CM | POA: Diagnosis not present

## 2016-12-17 MED ORDER — IOPAMIDOL (ISOVUE-300) INJECTION 61%
100.0000 mL | Freq: Once | INTRAVENOUS | Status: AC | PRN
  Administered 2016-12-17: 100 mL via INTRAVENOUS

## 2016-12-20 ENCOUNTER — Telehealth: Payer: Self-pay | Admitting: *Deleted

## 2016-12-20 NOTE — Telephone Encounter (Signed)
Results in EPIC for Dr. Dossie Arbour to review.

## 2016-12-21 ENCOUNTER — Other Ambulatory Visit: Payer: Self-pay | Admitting: Gastroenterology

## 2016-12-21 DIAGNOSIS — R1314 Dysphagia, pharyngoesophageal phase: Secondary | ICD-10-CM | POA: Diagnosis not present

## 2016-12-21 DIAGNOSIS — R93429 Abnormal radiologic findings on diagnostic imaging of unspecified kidney: Secondary | ICD-10-CM

## 2016-12-21 DIAGNOSIS — R1310 Dysphagia, unspecified: Secondary | ICD-10-CM | POA: Diagnosis not present

## 2016-12-24 DIAGNOSIS — M5136 Other intervertebral disc degeneration, lumbar region: Secondary | ICD-10-CM | POA: Diagnosis not present

## 2016-12-24 DIAGNOSIS — M9903 Segmental and somatic dysfunction of lumbar region: Secondary | ICD-10-CM | POA: Diagnosis not present

## 2016-12-25 DIAGNOSIS — R200 Anesthesia of skin: Secondary | ICD-10-CM | POA: Diagnosis not present

## 2016-12-25 DIAGNOSIS — R202 Paresthesia of skin: Secondary | ICD-10-CM | POA: Diagnosis not present

## 2016-12-28 DIAGNOSIS — M9903 Segmental and somatic dysfunction of lumbar region: Secondary | ICD-10-CM | POA: Diagnosis not present

## 2016-12-28 DIAGNOSIS — M5136 Other intervertebral disc degeneration, lumbar region: Secondary | ICD-10-CM | POA: Diagnosis not present

## 2017-01-01 DIAGNOSIS — M9903 Segmental and somatic dysfunction of lumbar region: Secondary | ICD-10-CM | POA: Diagnosis not present

## 2017-01-01 DIAGNOSIS — M5136 Other intervertebral disc degeneration, lumbar region: Secondary | ICD-10-CM | POA: Diagnosis not present

## 2017-01-02 ENCOUNTER — Ambulatory Visit
Admission: RE | Admit: 2017-01-02 | Discharge: 2017-01-02 | Disposition: A | Discharge status: Home / Self Care | Payer: PPO | Source: Ambulatory Visit | Attending: Gastroenterology | Admitting: Gastroenterology

## 2017-01-02 DIAGNOSIS — R002 Palpitations: Secondary | ICD-10-CM | POA: Diagnosis not present

## 2017-01-02 DIAGNOSIS — R93429 Abnormal radiologic findings on diagnostic imaging of unspecified kidney: Secondary | ICD-10-CM

## 2017-01-02 DIAGNOSIS — R931 Abnormal findings on diagnostic imaging of heart and coronary circulation: Secondary | ICD-10-CM | POA: Diagnosis not present

## 2017-01-02 DIAGNOSIS — N281 Cyst of kidney, acquired: Secondary | ICD-10-CM | POA: Diagnosis not present

## 2017-01-02 DIAGNOSIS — I471 Supraventricular tachycardia: Secondary | ICD-10-CM | POA: Diagnosis not present

## 2017-01-02 DIAGNOSIS — I10 Essential (primary) hypertension: Secondary | ICD-10-CM | POA: Diagnosis not present

## 2017-01-02 DIAGNOSIS — I491 Atrial premature depolarization: Secondary | ICD-10-CM | POA: Diagnosis not present

## 2017-01-02 DIAGNOSIS — I480 Paroxysmal atrial fibrillation: Secondary | ICD-10-CM | POA: Diagnosis not present

## 2017-01-02 DIAGNOSIS — R109 Unspecified abdominal pain: Secondary | ICD-10-CM | POA: Diagnosis not present

## 2017-01-02 MED ORDER — GADOBENATE DIMEGLUMINE 529 MG/ML IV SOLN
20.0000 mL | Freq: Once | INTRAVENOUS | Status: AC | PRN
  Administered 2017-01-02: 20 mL via INTRAVENOUS

## 2017-01-04 ENCOUNTER — Other Ambulatory Visit: Payer: Self-pay | Admitting: Orthopedic Surgery

## 2017-01-04 DIAGNOSIS — M2352 Chronic instability of knee, left knee: Secondary | ICD-10-CM | POA: Diagnosis not present

## 2017-01-04 DIAGNOSIS — M25562 Pain in left knee: Secondary | ICD-10-CM

## 2017-01-04 DIAGNOSIS — M2392 Unspecified internal derangement of left knee: Secondary | ICD-10-CM | POA: Diagnosis not present

## 2017-01-12 DIAGNOSIS — G4733 Obstructive sleep apnea (adult) (pediatric): Secondary | ICD-10-CM | POA: Diagnosis not present

## 2017-01-16 NOTE — Progress Notes (Signed)
-   Normal fasting (NPO x 8 hours) glucose levels are between 65-99 mg/dl, with 2 hour fasting, levels are usually less than 140 mg/dl. Any random blood glucose level greater than 200 mg/dl is considered to be Diabetes.  - Normal Creatinine levels are between 0.5 and 0.9 mg/dl for our lab. Any condition that impairs the function of the kidneys is likely to raise the creatinine level in the blood. The most common causes of longstanding (chronic) kidney disease in adults are high blood pressure and diabetes. Other causes of elevated blood creatinine levels include drugs, ingestion of a large amount of dietary meat, kidney infections, rhabdomyolysis (abnormal muscle breakdown), and urinary tract obstruction.  - This is a blood test that measures the amount of a substance called bilirubin. This test is used to find out how well your liver is working. It is often given as part of a panel of tests that measure liver function. A small amount of bilirubin in your blood is normal, but a high level may be a sign of liver disease. - The liver makes bile to help you digest food, and bile contains bilirubin. Most bilirubin comes from the body's normal process of breaking down old red blood cells. A healthy liver can normally get rid of bilirubin. But when you have liver problems, it can build up in your body to unhealthy levels. - The reference range of total bilirubin is 0.2-1.2 mg/dL. The reference range of direct bilirubin is 0.1-0.4 mg/dL. - Elevated bilirubin levels (>2.5-3 mg/dL) cause jaundice and can be classified into different anatomical sites of pathology: prehepatic (increased bilirubin production), hepatic (liver dysfunction), or posthepatic (duct obstruction). - Because Total Bilirubin is the sum of the conjugated and the unconjugated bilirubin, elevated levels may require further testing.

## 2017-01-17 ENCOUNTER — Ambulatory Visit (INDEPENDENT_AMBULATORY_CARE_PROVIDER_SITE_OTHER): Payer: PPO | Admitting: General Surgery

## 2017-01-17 ENCOUNTER — Encounter: Payer: Self-pay | Admitting: General Surgery

## 2017-01-17 VITALS — BP 122/86 | HR 74 | Resp 14 | Ht 71.0 in | Wt 241.0 lb

## 2017-01-17 DIAGNOSIS — R109 Unspecified abdominal pain: Secondary | ICD-10-CM

## 2017-01-17 DIAGNOSIS — R634 Abnormal weight loss: Secondary | ICD-10-CM | POA: Diagnosis not present

## 2017-01-17 MED ORDER — ONDANSETRON HCL 4 MG PO TABS: ORAL_TABLET | ORAL | 0 refills | Status: DC

## 2017-01-17 NOTE — Progress Notes (Signed)
Patient ID: James [name], male   DOB: [birth year], 61 y.o.   MRN: [id number]  Chief Complaint  Patient presents with  . Abdominal Pain    HPI James [name] is a 61 y.o. male here today for evaluation of epigastric pain, weight loss and constipation, referred by Dr James [name]. He states he started having symptoms back around Christmas. Upper and lower abdominal pain is off and on constantly. He describes the pain as sharp, cramping, and achey. Gets worse after eating. Not triggered by any particular foods. Patients states the more he eats the worse the pain is. He does have nausea with occasional vomiting.  Notes esophageal spasms. Gas pains especially hurt. He hasn't had a BM in about 3 weeks. He has lost 50lbs over the last 3 months.    CT scan done 12-17-16. I have reviewed the history of present illness with the patient.  HPI  Past Medical History:  Diagnosis Date  . Allergy   . Anemia   . Arthritis   . Asthma   . Blood transfusion without reported diagnosis   . Bronchitis   . Bursitis   . Cancer ([place])    skin  . Cataract    bilateral  . 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 Gastritis   . GERD (gastroesophageal reflux disease)   . Heart murmur   . Hepatitis C   . Hiatal hernia   . Hypertension   . Hypothyroidism   . Lupus   . Obesity   . Osteoporosis   . Peripheral nerve disease ([place])   . Psoriatic arthritis ([place])   . Sleep apnea   . Supraventricular tachycardia ([place])   . Tendonitis   . Thyroid disease     Past Surgical History:  Procedure Laterality Date  . COLONOSCOPY  2009   in [place]  . ESOPHAGOGASTRODUODENOSCOPY (EGD) WITH PROPOFOL N/A 01/20/2016   Procedure: ESOPHAGOGASTRODUODENOSCOPY (EGD) WITH PROPOFOL;  Surgeon: Lollie Sails, MD;  Location: [facility] ENDOSCOPY;  Service: Endoscopy;  Laterality: N/A;  . EYE SURGERY     removal of foreign object  . FRACTURE SURGERY Right    arm  . FRACTURE SURGERY Bilateral     fingers  . FRACTURE SURGERY Bilateral    wrist  . JOINT REPLACEMENT Right    radial head  . PATELLA ARTHROPLASTY    . SHOULDER ARTHROSCOPY Bilateral   . SPINAL CORD STIMULATOR IMPLANT    . SPINAL CORD STIMULATOR REMOVAL    . SPINE SURGERY     cervical and thoracic and lumbar    Family History  Problem Relation Age of Onset  . Cancer Mother   . Arthritis Mother   . Diabetes Mother   . Stroke Mother   . Hypertension Mother   . Cancer Father   . Arthritis Father   . COPD Father   . Hypertension Father     Social History Social History  Substance Use Topics  . Smoking status: Former Smoker    Types: Cigarettes  . Smokeless tobacco: Current User    Types: Chew  . Alcohol use No    Allergies  Allergen Reactions  . Penicillins Rash    tachycardia  . Lactose Other (See Comments)    Other reaction(s): Unknown Stomach bleed  . Nsaids   . Prednisone   . Procaine     Other reaction(s): Other (See Comments) Tachycardia  . Sulfa Antibiotics Nausea Only    Other reaction(s): Unknown  . Sulfasalazine  Other reaction(s): Other (See Comments) Severe joint pain  . Talwin [Pentazocine] Other (See Comments) and Nausea Only    tachycardia  . Codeine Rash    tachycardia    Current Outpatient Prescriptions  Medication Sig Dispense Refill  . albuterol (PROAIR HFA) 108 (90 BASE) MCG/ACT inhaler Inhale 2 puffs into the lungs every 4 (four) hours as needed for wheezing or shortness of breath.     Marland Kitchen azelastine (ASTELIN) 0.1 % nasal spray Place 1 spray into both nostrils 2 (two) times daily.     . budesonide-formoterol (SYMBICORT) 80-4.5 MCG/ACT inhaler Inhale 2 puffs into the lungs 2 (two) times daily.    Derrill Memo ON 02/18/2017] fentaNYL (DURAGESIC - DOSED MCG/HR) 12 MCG/HR Place 1 patch (12.5 mcg total) onto the skin every 3 (three) days. 10 patch 0  . levothyroxine (SYNTHROID, LEVOTHROID) 25 MCG tablet TAKE 1 TABLET EVERY MORNING ON AN EMPTY STOMACH WITH A GLASS OF WATERAT  LEAST 30 TO 60 MINUTES BEFORE BREAKFAST    . Loratadine 10 MG CAPS Take 10 mg by mouth daily.     . methotrexate 50 MG/2ML injection Inject 50 mg/m2 as directed once a week.     . metoprolol (LOPRESSOR) 100 MG tablet TAKE 1 TABLET BY MOUTH TWICE A DAY    . [START ON 02/18/2017] Oxycodone HCl 20 MG TABS Take 1 tablet (20 mg total) by mouth every 8 (eight) hours as needed. 90 tablet 0  . promethazine (PHENERGAN) 12.5 MG tablet Take by mouth every 4 (four) hours as needed.     . sucralfate (CARAFATE) 1 G tablet Take 1 g by mouth 4 (four) times daily.     . temazepam (RESTORIL) 15 MG capsule Take 15 mg by mouth at bedtime as needed for sleep.    Marland Kitchen testosterone cypionate (DEPOTESTOSTERONE CYPIONATE) 200 MG/ML injection INJECT 1 ML INTRAMUSCULARY EVERY 2 WEEKS    . tiZANidine (ZANAFLEX) 4 MG capsule Take 4 mg by mouth 3 (three) times daily as needed for muscle spasms.    . ondansetron (ZOFRAN) 4 MG tablet Take one (1) pill every six hours as needed for nausea. 20 tablet 0   No current facility-administered medications for this visit.     Review of Systems Review of Systems  Constitutional: Negative.   Respiratory: Negative.   Cardiovascular: Negative.   Gastrointestinal: Positive for abdominal pain, constipation, nausea and vomiting.    Blood pressure 122/86, pulse 74, resp. rate 14, height 5\' 11"  (1.803 m), weight 241 lb (109.3 kg).  Physical Exam Physical Exam  Constitutional: He appears well-developed and well-nourished.  HENT:  Mouth/Throat: Oropharynx is clear and moist.  Eyes: Conjunctivae are normal. No scleral icterus.  Neck: Neck supple.  Cardiovascular: Normal rate, regular rhythm and normal heart sounds.   Pulmonary/Chest: Effort normal and breath sounds normal.  Abdominal: Soft. Bowel sounds are normal. There is no splenomegaly or hepatomegaly. There is tenderness ( Mild epigastric tenderness) in the epigastric area. No hernia.  Lymphadenopathy:    He has no cervical  adenopathy.    He has no axillary adenopathy.       Right: No inguinal adenopathy present.       Left: No inguinal adenopathy present.  Neurological: He is alert.  Skin: Skin is warm and dry.  Psychiatric: His behavior is normal.    Data Reviewed Abdominal CT  - evidence of mild mesenteritis. Otherwise negative for any sign of malignancy.  Assessment   No apparent cause for his symptoms. CT findings are  of questionable significance. Recommended Duplex study of mesenteric vessels      Plan    Duplex scan of mesenteric vessels  This has been scheduled for 02-14-17 at 10 am at [facility]. Patient has been placed on the waiting list.   Follow up after scan.    Patient was also prescribed Zofran 4 mg one by mouth every 6 hours as needed for nausea #20 no refills. This was sent in to patient's pharmacy today.  This information has been scribed by Karie Fetch RN, BSN,BC.   Keyonni Percival G 01/18/2017, 8:56 AM

## 2017-01-17 NOTE — Patient Instructions (Addendum)
The patient is aware to call back for any questions or concerns.  

## 2017-01-18 ENCOUNTER — Ambulatory Visit: Payer: PPO

## 2017-01-24 ENCOUNTER — Ambulatory Visit
Admission: RE | Admit: 2017-01-24 | Discharge: 2017-01-24 | Disposition: A | Discharge status: Home / Self Care | Payer: PPO | Source: Ambulatory Visit | Attending: Specialist | Admitting: Specialist

## 2017-01-24 DIAGNOSIS — I2510 Atherosclerotic heart disease of native coronary artery without angina pectoris: Secondary | ICD-10-CM | POA: Insufficient documentation

## 2017-01-24 DIAGNOSIS — J439 Emphysema, unspecified: Secondary | ICD-10-CM | POA: Insufficient documentation

## 2017-01-24 DIAGNOSIS — I700 Atherosclerosis of aorta: Secondary | ICD-10-CM | POA: Insufficient documentation

## 2017-01-24 DIAGNOSIS — R918 Other nonspecific abnormal finding of lung field: Secondary | ICD-10-CM | POA: Insufficient documentation

## 2017-01-24 DIAGNOSIS — R911 Solitary pulmonary nodule: Secondary | ICD-10-CM | POA: Diagnosis not present

## 2017-01-25 DIAGNOSIS — E039 Hypothyroidism, unspecified: Secondary | ICD-10-CM | POA: Diagnosis not present

## 2017-01-25 DIAGNOSIS — R109 Unspecified abdominal pain: Secondary | ICD-10-CM | POA: Diagnosis not present

## 2017-01-25 DIAGNOSIS — I10 Essential (primary) hypertension: Secondary | ICD-10-CM | POA: Diagnosis not present

## 2017-01-25 DIAGNOSIS — K5900 Constipation, unspecified: Secondary | ICD-10-CM | POA: Diagnosis not present

## 2017-01-25 DIAGNOSIS — E349 Endocrine disorder, unspecified: Secondary | ICD-10-CM | POA: Insufficient documentation

## 2017-01-25 DIAGNOSIS — R634 Abnormal weight loss: Secondary | ICD-10-CM | POA: Diagnosis not present

## 2017-02-05 DIAGNOSIS — R0609 Other forms of dyspnea: Secondary | ICD-10-CM | POA: Diagnosis not present

## 2017-02-05 DIAGNOSIS — G4733 Obstructive sleep apnea (adult) (pediatric): Secondary | ICD-10-CM | POA: Diagnosis not present

## 2017-02-05 DIAGNOSIS — J439 Emphysema, unspecified: Secondary | ICD-10-CM | POA: Diagnosis not present

## 2017-02-05 DIAGNOSIS — J310 Chronic rhinitis: Secondary | ICD-10-CM | POA: Diagnosis not present

## 2017-02-05 DIAGNOSIS — R918 Other nonspecific abnormal finding of lung field: Secondary | ICD-10-CM | POA: Diagnosis not present

## 2017-02-12 DIAGNOSIS — G4733 Obstructive sleep apnea (adult) (pediatric): Secondary | ICD-10-CM | POA: Diagnosis not present

## 2017-02-13 DIAGNOSIS — M1990 Unspecified osteoarthritis, unspecified site: Secondary | ICD-10-CM | POA: Diagnosis not present

## 2017-02-13 DIAGNOSIS — M150 Primary generalized (osteo)arthritis: Secondary | ICD-10-CM | POA: Diagnosis not present

## 2017-02-13 DIAGNOSIS — M791 Myalgia: Secondary | ICD-10-CM | POA: Diagnosis not present

## 2017-02-13 DIAGNOSIS — Z79899 Other long term (current) drug therapy: Secondary | ICD-10-CM | POA: Diagnosis not present

## 2017-02-14 ENCOUNTER — Encounter (INDEPENDENT_AMBULATORY_CARE_PROVIDER_SITE_OTHER): Payer: PPO

## 2017-02-18 ENCOUNTER — Ambulatory Visit: Payer: PPO | Admitting: General Surgery

## 2017-02-26 ENCOUNTER — Ambulatory Visit: Payer: PPO | Admitting: Pain Medicine

## 2017-03-06 NOTE — Progress Notes (Addendum)
Patient's Name: James [name]  MRN: [id number]  Referring Provider: [name], MD  DOB: [birth year]  PCP: [name], MD  DOS: 03/07/2017  Note by: Kathlen Brunswick. Dossie Arbour, MD  Service setting: Ambulatory outpatient  Specialty: Interventional Pain Management  Location: [facility] (AMB) Pain Management Facility    Patient type: Established   Primary Reason(s) for Visit: Encounter for prescription drug management (Level of risk: moderate) CC: Back Pain (lumbar and cervical)  HPI  James [name] is a 61 y.o. year old, male patient, who comes today for a medication management evaluation. He has Airway hyperreactivity; Atrial fibrillation ([place]); Narrowing of intervertebral disc space; Abnormal echocardiogram; Gastric ulcer; Gastric catarrh; Acid reflux; Awareness of heartbeats; Hemorrhoid; Hepatitis; BP (high blood pressure); Lactose intolerance; Osteopenia; APC (atrial premature contractions); Apnea, sleep; Supraventricular tachycardia ([place]); Long term current use of opiate analgesic; Long term prescription opiate use; Opiate use (120 MME/Day); Opiate dependence ([place]); Encounter for therapeutic drug level monitoring; Chronic low back pain (Location of Primary Source of Pain) (Bilateral) (R>L); Lumbar spondylosis; Chronic neck pain (Right); Cervical spondylosis (C7 intravertebral body cyst); Chronic cervical radicular pain (Right); Chronic lumbar radicular pain (Location of Secondary source of pain) (Bilateral) (L>R) (L5 Dermatome); Osteoarthritis; Chronic hip pain; Chronic knee pain (Bilateral) (R>L); Complex regional pain syndrome type II of upper extremity (Right); Chronic upper extremity pain (Right); 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);  Epidural fibrosis; Musculoskeletal pain; Neurogenic pain; Neuropathic pain; Chronic sacroiliac joint pain (Bilateral) (R>L); Psoriatic arthritis ([place]); Controlled type 2 diabetes mellitus without complication ([place]); Chest pain; Acquired hypothyroidism; Chronic lower extremity pain (Location of Secondary source of pain) (Bilateral) (L>R); Psoriasis with arthropathy ([place]); Hypothyroidism; Numbness and tingling of both legs; Abnormal nerve conduction studies (06/20/16); Chronic pain syndrome; and Hypotestosteronism on his problem list. His primarily concern today is the Back Pain (lumbar and cervical)  Pain Assessment: Self-Reported Pain Score: 4 /10 Clinically the patient looks like a 3/10 Reported level is inconsistent with clinical observations. Information on the proper use of the pain scale provided to the patient today Pain Type: Chronic pain Pain Location: Back Pain Orientation: Lower, Upper Pain Descriptors / Indicators: Aching, Stabbing, Throbbing, Sharp Pain Frequency: Constant  James [name] was last scheduled for an appointment on 12/06/2016 for medication management. During today's appointment we reviewed James [name] chronic pain status, as well as his outpatient medication regimen. He comes in today indicating that he is having a lot of pain both in the cervical region in the lumbar region. His worst pain seems to be in the lumbar region and into bilateral with the right side being worse than the left. This pain also goes down both lower extremities and in the case of the right lower extremity goes down to the bottom of the foot while on the left it goes to the top of the foot in the toes in what appears to be an S1 distribution on the right side and an L5 distribution on the left. He does have a prior lumbar surgery at the L3-4 level. He would like for [place] to get started in treating his low back pain first, followed later by his cervical or neck pain.  The patient  reports that he does not  use drugs. His body mass index is 32.55 kg/m.  Further details on both, my assessment(s), as  well as the proposed treatment plan, please see below.  Controlled Substance Pharmacotherapy Assessment REMS (Risk Evaluation and Mitigation Strategy)  Analgesic:Fentanyl patch 12.5 mcg/h every 72 hours + oxycodone IR 20 mg every 6 hours (60 mg/day) MME/day:120 mg/day  James Shorter, RN  03/07/2017  8:01 AM  Signed Nursing Pain Medication Assessment:  Safety precautions to be maintained throughout the outpatient stay will include: orient to surroundings, keep bed in low position, maintain call bell within reach at all times, provide assistance with transfer out of bed and ambulation.  Medication Inspection Compliance: Pill count conducted under aseptic conditions, in front of the patient. Neither the pills nor the bottle was removed from the patient's sight at any time. Once count was completed pills were immediately returned to the patient in their original bottle.  Medication #1:  Oxycodone Pill/Patch Count: 66 of 90 pills remain Pill/Patch Appearance: Markings consistent with prescribed medication Bottle Appearance: Standard pharmacy container. Clearly labeled. Filled Date: 04 / 25 / 2018 Last Medication intake:  Today  Medication #2: Fentanyl patch Pill/Patch Count: 7 of 10 patches remain Pill/Patch Appearance: Markings consistent with prescribed medication Bottle Appearance: Standard pharmacy container. Clearly labeled. Filled Date: 04/ 25 / 2018 Last Medication intake:  Today   Pharmacokinetics: Liberation and absorption (onset of action): WNL Distribution (time to peak effect): WNL Metabolism and excretion (duration of action): WNL         Pharmacodynamics: Desired effects: Analgesia: Mr. Chirico reports >50% benefit. Functional ability: Patient reports that medication allows him to accomplish basic ADLs Clinically meaningful improvement in function (CMIF): Sustained CMIF goals  met Perceived effectiveness: Described as relatively effective, allowing for increase in activities of daily living (ADL) Undesirable effects: Side-effects or Adverse reactions: None reported Monitoring: [place] PMP: Online review of the past 4-monthperiod conducted. Compliant with practice rules and regulations List of all UDS test(s) done:  Lab Results  Component Value Date   TOXASSSELUR FINAL 12/06/2016   TOXASSSELUR FINAL 03/07/2016   TOXASSSELUR FINAL 12/12/2015   TRingstedFINAL 09/12/2015   Last UDS on record: Tox[employer] Select 13  Date Value Ref Range Status  12/06/2016 FINAL  Final    Comment:    ==================================================================== TOXASSURE SELECT 13 (MW) ==================================================================== Test                             Result       Flag       Units Drug Present and Declared for Prescription Verification   Oxazepam                       24           EXPECTED   ng/mg creat   Temazepam                      33           EXPECTED   ng/mg creat    Oxazepam and temazepam are expected metabolites of diazepam.    Oxazepam is also an expected metabolite of other benzodiazepine    drugs, including chlordiazepoxide, prazepam, clorazepate,    halazepam, and temazepam.  Oxazepam and temazepam are available    as scheduled prescription medications.   Oxycodone                      2572         EXPECTED  ng/mg creat   Oxymorphone                    196          EXPECTED   ng/mg creat   Noroxycodone                   >3788        EXPECTED   ng/mg creat   Noroxymorphone                 1452         EXPECTED   ng/mg creat    Sources of oxycodone are scheduled prescription medications.    Oxymorphone, noroxycodone, and noroxymorphone are expected    metabolites of oxycodone. Oxymorphone is also available as a    scheduled prescription medication.   Fentanyl                       5            EXPECTED   ng/mg creat    Norfentanyl                    77           EXPECTED   ng/mg creat    Source of fentanyl is a scheduled prescription medication,    including IV, patch, and transmucosal formulations. Norfentanyl    is an expected metabolite of fentanyl. ==================================================================== Test                      Result    Flag   Units      Ref Range   Creatinine              264              mg/dL      >=20 ==================================================================== Declared Medications:  The flagging and interpretation on this report are based on the  following declared medications.  Unexpected results may arise from  inaccuracies in the declared medications.  **Note: The testing scope of this panel includes these medications:  Fentanyl (Duragesic)  Oxycodone  Temazepam (Restoril)  **Note: The testing scope of this panel does not include following  reported medications:  Albuterol  Azelastine  Budenoside (Symbicort)  Formoterol (Symbicort)  Levothyroxine  Loratadine  Methotrexate  Metoprolol (Lopressor)  Promethazine (Phenergan)  Sucralfate (Carafate)  Testosterone (Depo-Testosterone)  Tizanidine (Zanaflex) ==================================================================== For clinical consultation, please call [phone]. ====================================================================    UDS interpretation: Compliant          Medication Assessment Form: Reviewed. Patient indicates being compliant with therapy Treatment compliance: Compliant Risk Assessment Profile: Aberrant behavior: See prior evaluations. None observed or detected today Comorbid factors increasing risk of overdose: See prior notes. No additional risks detected today Risk of substance use disorder (SUD): Low Opioid Risk Tool (ORT) Total Score: 0  Interpretation Table:  Score <3 = Low Risk for SUD  Score between 4-7 = Moderate Risk for SUD  Score >8 = High Risk  for Opioid Abuse   Risk Mitigation Strategies:  Patient Counseling: Covered Patient-Prescriber Agreement (PPA): Present and active  Notification to other healthcare providers: Done  Pharmacologic Plan: No change in therapy, at this time  Laboratory Chemistry  Inflammation Markers Lab Results  Component Value Date   CRP <0.80 12/06/2016   ESRSEDRATE 9 12/06/2016   (CRP: Acute Phase) (ESR: Chronic Phase) Renal Function Markers Lab Results  Component Value Date   BUN 14 12/06/2016   CREATININE 1.26 (H) 12/06/2016   GFRAA >60 12/06/2016   GFRNONAA >60 12/06/2016   Hepatic Function Markers Lab Results  Component Value Date   AST 35 12/06/2016   ALT 33 12/06/2016   ALBUMIN 4.4 12/06/2016   ALKPHOS 47 12/06/2016   Electrolytes Lab Results  Component Value Date   NA 139 12/06/2016   K 4.6 12/06/2016   CL 103 12/06/2016   CALCIUM 9.6 12/06/2016   MG 2.2 12/06/2016   Neuropathy Markers Lab Results  Component Value Date   VITAMINB12 423 12/06/2016   Bone Pathology Markers Lab Results  Component Value Date   ALKPHOS 47 12/06/2016   25OHVITD1 42 12/06/2016   25OHVITD2 <1.0 12/06/2016   25OHVITD3 42 12/06/2016   CALCIUM 9.6 12/06/2016   Coagulation Parameters No results found for: INR, LABPROT, APTT, PLT Cardiovascular Markers No results found for: BNP, HGB, HCT Note: Lab results reviewed.  Recent Diagnostic Imaging Review  Ct Chest Wo Contrast  Result Date: 01/24/2017 CLINICAL DATA:  Chest pain EXAM: CT CHEST WITHOUT CONTRAST TECHNIQUE: Multidetector CT imaging of the chest was performed following the standard protocol without IV contrast. COMPARISON:  01/25/2016 FINDINGS: Cardiovascular: Aortic atherosclerosis noted. Calcification within the LAD, RCA and left circumflex coronary artery noted. No pericardial effusion. Mediastinum/Nodes: The trachea appears patent and is midline. Normal appearance of the esophagus. No mediastinal or hilar adenopathy. Lungs/Pleura:  There is no pleural fluid. Moderate changes of centrilobular emphysema. Stable 4 mm left upper lobe lung nodule, image 46 of series 3. The pulmonary nodule within the superior segment of the left lower lobe is measuring 2 mm, image 55 of series 3. Within the lingula there is a 5 mm lung nodule which is unchanged, image 92 of series 3. Left lower lobe lung nodule is stable measuring 4 mm, image 94 of series 3. 4 mm nodule within the posterior right lower lobe is stable, image 81 of series 3. No new or enlarging pulmonary nodules. , Upper Abdomen: No acute abnormality. Musculoskeletal: Degenerative disc disease identified. No aggressive lytic or sclerotic bone lesions. IMPRESSION: 1. No acute cardiopulmonary abnormalities. Stable pulmonary nodules measuring up to 5 mm when compared with 01/25/2016. No further followup of these lung not poles is indicated at this time. This recommendation follows the consensus statement: Guidelines for Management of Incidental Pulmonary Nodules Detected on CT Images: From the Fleischner Society 2017; Radiology 2017; 284:228-243. 2. Aortic atherosclerosis and coronary artery calcification. 3. Emphysema. Electronically Signed   By: Kerby Moors M.D.   On: 01/24/2017 13:44   Note: Imaging results reviewed.          Meds  The patient has a current medication list which includes the following prescription(s): albuterol, azelastine, budesonide-formoterol, fentanyl, fentanyl, fentanyl, levothyroxine, loratadine, methotrexate, metoprolol tartrate, ondansetron, oxycodone hcl, oxycodone hcl, oxycodone hcl, promethazine, sucralfate, temazepam, testosterone cypionate, and tizanidine.  Current Outpatient Prescriptions on File Prior to Visit  Medication Sig  . albuterol (PROAIR HFA) 108 (90 BASE) MCG/ACT inhaler Inhale 2 puffs into the lungs every 4 (four) hours as needed for wheezing or shortness of breath.   Marland Kitchen azelastine (ASTELIN) 0.1 % nasal spray Place 1 spray into both nostrils 2  (two) times daily.   . budesonide-formoterol (SYMBICORT) 80-4.5 MCG/ACT inhaler Inhale 2 puffs into the lungs 2 (two) times daily.  Marland Kitchen levothyroxine (SYNTHROID, LEVOTHROID) 25 MCG tablet TAKE 1 TABLET EVERY MORNING ON AN EMPTY STOMACH WITH A GLASS OF WATERAT LEAST 30 TO  [place]  . Loratadine 10 MG CAPS Take 10 mg by mouth daily.   . methotrexate 50 MG/2ML injection Inject 50 mg/m2 as directed once a week.   . metoprolol (LOPRESSOR) 100 MG tablet TAKE 1 TABLET BY MOUTH TWICE A DAY  . ondansetron (ZOFRAN) 4 MG tablet Take one (1) pill every six hours as needed for nausea.  . promethazine (PHENERGAN) 12.5 MG tablet Take by mouth every 4 (four) hours as needed.   . sucralfate (CARAFATE) 1 G tablet Take 1 g by mouth 4 (four) times daily.   . temazepam (RESTORIL) 15 MG capsule Take 15 mg by mouth at bedtime as needed for sleep.  Marland Kitchen testosterone cypionate (DEPOTESTOSTERONE CYPIONATE) 200 MG/ML injection INJECT 1 ML INTRAMUSCULARY EVERY 2 WEEKS  . tiZANidine (ZANAFLEX) 4 MG capsule Take 4 mg by mouth 3 (three) times daily as needed for muscle spasms.   No current facility-administered medications on file prior to visit.    ROS  Constitutional: Denies any fever or chills Gastrointestinal: No reported hemesis, hematochezia, vomiting, or acute GI distress Musculoskeletal: Denies any acute onset joint swelling, redness, loss of ROM, or weakness Neurological: No reported episodes of acute onset apraxia, aphasia, dysarthria, agnosia, amnesia, paralysis, loss of coordination, or loss of consciousness  Allergies  Mr. Sagar is allergic to penicillins; lactose; nsaids; prednisone; procaine; sulfa antibiotics; sulfasalazine; talwin [pentazocine]; and codeine.  [place]  Drug: Mr. Dubuc  reports that he does not use drugs. Alcohol:  reports that he does not drink alcohol. Tobacco:  reports that he has quit smoking. His smoking use included Cigarettes. His smokeless tobacco use includes  Chew. 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 Family: family history includes Arthritis in his father and mother; COPD in his father; Cancer in his father and mother; Diabetes in his mother; Hypertension in his father and mother; Stroke in his mother.  Past Surgical History:  Procedure Laterality Date  . COLONOSCOPY  2009   in [place]  . ESOPHAGOGASTRODUODENOSCOPY (EGD) WITH PROPOFOL N/A 01/20/2016   Procedure: ESOPHAGOGASTRODUODENOSCOPY (EGD) WITH PROPOFOL;  Surgeon: Lollie Sails, MD;  Location: [facility] ENDOSCOPY;  Service: Endoscopy;  Laterality: N/A;  . EYE SURGERY     removal of foreign object  . FRACTURE SURGERY Right    arm  . FRACTURE SURGERY Bilateral    fingers  . FRACTURE SURGERY Bilateral    wrist  . JOINT REPLACEMENT Right    radial head  . PATELLA ARTHROPLASTY    . SHOULDER ARTHROSCOPY Bilateral   . SPINAL CORD STIMULATOR IMPLANT    . SPINAL CORD STIMULATOR REMOVAL    . SPINE SURGERY     cervical and thoracic and lumbar   Constitutional Exam  General appearance: Well nourished, well developed, and well hydrated. In no apparent acute distress Vitals:   03/07/17 0749  BP: 123/72  Pulse: 66  Resp: 18  Temp: 97.9 F (36.6 C)  SpO2: 100%  Weight: 240 lb (108.9 kg)  Height: 6' (1.829 m)   BMI Assessment: Estimated body mass index is 32.55 kg/m as calculated from the following:   Height as of this encounter: 6' (1.829 m).   Weight as of  this encounter: 240 lb (108.9 kg).  BMI  interpretation table: BMI level Category Range association with higher incidence of chronic pain  <18 kg/m2 Underweight   18.5-24.9 kg/m2 Ideal body weight   25-29.9 kg/m2 Overweight Increased incidence by 20%  30-34.9 kg/m2 Obese (Class I) Increased incidence by 68%  35-39.9 kg/m2 Severe obesity (Class II) Increased incidence by 136%  >40 kg/m2 Extreme obesity (Class III) Increased incidence by 254%   BMI Readings from Last 4 Encounters:  03/07/17 32.55 kg/m  01/17/17 33.61 kg/m  12/06/16 33.50 kg/m  09/11/16 35.53 kg/m   Wt Readings from Last 4 Encounters:  03/07/17 240 lb (108.9 kg)  01/17/17 241 lb (109.3 kg)  12/06/16 247 lb (112 kg)  09/11/16 262 lb (118.8 kg)  Psych/Mental status: Alert, oriented x 3 (person, place, & time)       Eyes: PERLA Respiratory: No evidence of acute respiratory distress  Cervical Spine Exam  Inspection: No masses, redness, or swelling Alignment: Symmetrical Functional ROM: Decreased ROM      Stability: No instability detected Muscle strength & Tone: Functionally intact Sensory: Movement-associated pain Palpation: Complains of area being tender to palpation              Upper Extremity (UE) Exam    Side: Right upper extremity  Side: Left upper extremity  Inspection: No masses, redness, swelling, or asymmetry. No contractures  Inspection: No masses, redness, swelling, or asymmetry. No contractures  Functional ROM: Unrestricted ROM          Functional ROM: Unrestricted ROM          Muscle strength & Tone: Functionally intact  Muscle strength & Tone: Functionally intact  Sensory: Unimpaired  Sensory: Unimpaired  Palpation: No palpable anomalies              Palpation: No palpable anomalies              Specialized Test(s): Deferred         Specialized Test(s): Deferred          Thoracic Spine Exam  Inspection: No masses, redness, or swelling Alignment: Symmetrical Functional ROM: Unrestricted ROM Stability: No instability  detected Sensory: Unimpaired Muscle strength & Tone: No palpable anomalies  Lumbar Spine Exam  Inspection: Well healed scar from previous spine surgery detected Alignment: Symmetrical Functional ROM: Minimal ROM      Stability: No instability detected Muscle strength & Tone: Functionally intact Sensory: Movement-associated pain Palpation: Complains of area being tender to palpation       Provocative Tests: Lumbar Hyperextension and rotation test: Positive bilaterally for facet joint pain. Patrick's Maneuver: evaluation deferred today                    Gait & Posture Assessment  Ambulation: Unassisted Gait: Relatively normal for age and body habitus Posture: WNL   Lower Extremity Exam    Side: Right lower extremity  Side: Left lower extremity  Inspection: No masses, redness, swelling, or asymmetry. No contractures  Inspection: No masses, redness, swelling, or asymmetry. No contractures  Functional ROM: Unrestricted ROM          Functional ROM: Unrestricted ROM          Muscle strength & Tone: Functionally intact  Muscle strength & Tone: Functionally intact  Sensory: Unimpaired  Sensory: Unimpaired  Palpation: No palpable anomalies  Palpation: No palpable anomalies   Assessment  Primary Diagnosis & Pertinent Problem List: The primary encounter diagnosis was Chronic low back pain (  Location of Primary Source of Pain) (Bilateral) (R>L). Diagnoses of Chronic lower extremity pain (Location of Secondary source of pain) (Bilateral) (L>R), Chronic lumbar radicular pain (Location of Secondary source of pain) (Bilateral) (L>R) (L5 Dermatome), Chronic pain syndrome, Long term prescription opiate use, Opiate use (120 MME/Day), Lumbar facet syndrome (Location of Primary Source of Pain) (Bilateral) (R>L), Lumbar spondylosis, Abnormal echocardiogram, Chronic atrial fibrillation ([place]), Hepatitis, Osteopenia of multiple sites, and Supraventricular tachycardia ([place]) were also pertinent to this  visit.  Status Diagnosis  Recurring Controlled Controlled 1. Chronic low back pain (Location of Primary Source of Pain) (Bilateral) (R>L)   2. Chronic lower extremity pain (Location of Secondary source of pain) (Bilateral) (L>R)   3. Chronic lumbar radicular pain (Location of Secondary source of pain) (Bilateral) (L>R) (L5 Dermatome)   4. Chronic pain syndrome   5. Long term prescription opiate use   6. Opiate use (120 MME/Day)   7. Lumbar facet syndrome (Location of Primary Source of Pain) (Bilateral) (R>L)   8. Lumbar spondylosis   9. Abnormal echocardiogram   10. Chronic atrial fibrillation ([facility])   11. Hepatitis   12. Osteopenia of multiple sites   13. Supraventricular tachycardia ([place])      Plan of Care  Pharmacotherapy (Medications Ordered): Meds ordered this encounter  Medications  . fentaNYL (DURAGESIC - DOSED MCG/HR) 12 MCG/HR    Sig: Place 1 patch (12.5 mcg total) onto the skin every 3 (three) days.    Dispense:  10 patch    Refill:  0    Do not place this medication, or any other prescription from our practice, on "Automatic Refill". Patient may have prescription filled one day early if pharmacy is closed on scheduled refill date. Do not fill until: 03/20/17 To last until: 04/19/17  . Oxycodone HCl 20 MG TABS    Sig: Take 1 tablet (20 mg total) by mouth every 8 (eight) hours as needed.    Dispense:  90 tablet    Refill:  0    Do not place this medication, or any other prescription from our practice, on "Automatic Refill". Patient may have prescription filled one day early if pharmacy is closed on scheduled refill date. Do not fill until: 03/20/17 To last until: 04/19/17  . fentaNYL (DURAGESIC - DOSED MCG/HR) 12 MCG/HR    Sig: Place 1 patch (12.5 mcg total) onto the skin every 3 (three) days.    Dispense:  10 patch    Refill:  0    Do not place this medication, or any other prescription from our practice, on "Automatic Refill". Patient may have prescription filled  one day early if pharmacy is closed on scheduled refill date. Do not fill until: 04/19/17 To last until: 05/19/17  . Oxycodone HCl 20 MG TABS    Sig: Take 1 tablet (20 mg total) by mouth every 8 (eight) hours as needed.    Dispense:  90 tablet    Refill:  0    Do not place this medication, or any other prescription from our practice, on "Automatic Refill". Patient may have prescription filled one day early if pharmacy is closed on scheduled refill date. Do not fill until: 04/19/17 To last until: 05/19/17  . fentaNYL (DURAGESIC - DOSED MCG/HR) 12 MCG/HR    Sig: Place 1 patch (12.5 mcg total) onto the skin every 3 (three) days.    Dispense:  10 patch    Refill:  0    Do not place this medication, or any other prescription  from our practice, on "Automatic Refill". Patient may have prescription filled one day early if pharmacy is closed on scheduled refill date. Do not fill until: 05/19/17 To last until: 06/18/17  . Oxycodone HCl 20 MG TABS    Sig: Take 1 tablet (20 mg total) by mouth every 8 (eight) hours.    Dispense:  90 tablet    Refill:  0    Do not place this medication, or any other prescription from our practice, on "Automatic Refill". Patient may have prescription filled one day early if pharmacy is closed on scheduled refill date. Do not fill until: 05/19/17 To last until: 06/18/17   New Prescriptions   No medications on file   Medications administered today: Mr. Shughart had no medications administered during this visit. Lab-work, procedure(s), and/or referral(s): Orders Placed This Encounter  Procedures  . LUMBAR FACET(MEDIAL BRANCH NERVE BLOCK) MBNB   Imaging and/or referral(s): None  Interventional therapies: Planned, scheduled, and/or pending:   Diagnostic bilateral lumbar facet block under fluoroscopic guidance and IV sedation. The patient is unable to safely do any physical therapy for his low back pain secondary to severe cardiovascular problems including angina  pectoris. The patient has evidence of an abnormal echocardiogram.    Considering:   Right-sided cervical epidural steroid injection.  Right-sided L4-5 lumbar epidural steroid injection.  Diagnostic bilateral lumbar facet block. Possible bilateral lumbar facet radiofrequency ablation. Diagnostic bilateral cervical facet block.  Possible bilateral cervical facet radiofrequency ablation.   Palliative PRN treatment(s):   For the arm pain and numbness, right-sided cervical epidural steroid injection For the lower extremity pain, right-sided L4-5 lumbar epidural steroid injection For the low back pain, diagnostic bilateral lumbar facet block For the neck pain, diagnostic bilateral cervical facet block   Provider-requested follow-up: Return in about 3 months (around 06/07/2017) for Med-Mgmt, by NP, in addition, procedure (w/ sedation):, (ASAP), by MD.  Future Appointments Date Time Provider Teresita  06/06/2017 7:45 AM Milinda Pointer, MD [facility] None   Primary Care Physician: [name], MD Location: [facility] Note by: Kathlen Brunswick. Dossie Arbour, M.D, DABA, DABAPM, DABPM, DABIPP, FIPP Date: 03/07/2017; Time: 9:21 AM  Patient instructions provided during this appointment: Patient Instructions     You were given 3 prescriptions for oxycodone and 3 prescriptions for Duragesic patch today.  You will be scheduled for a procedure with sedation.  You were informed to not eat or drink for 8 hours prior to procedure, bring a driver and take blood pressure medication the morning of the procedure.     Preparing for Procedure with Sedation Instructions: . Oral Intake: Do not eat or drink anything for at least 8 hours prior to your procedure. . Transportation: Public transportation is not allowed. Bring an adult driver. The driver must be physically present in our waiting room before any procedure can be started. Marland Kitchen Physical Assistance: Bring an adult  physically capable of assisting you, in the event you need help. This adult should keep you company at home for at least 6 hours after the procedure. . Blood Pressure Medicine: Take your blood pressure medicine with a sip of water the morning of the procedure. . Blood thinners:  . Diabetics on insulin: Notify the staff so that you can be scheduled 1st case in the morning. If your diabetes requires high dose insulin, take only  of your normal insulin dose the morning of the procedure and notify the staff that you have done so. . Preventing infections: Shower with an  antibacterial soap the morning of your procedure. . Build-up your immune system: Take 1000 mg of Vitamin C with every meal (3 times a day) the day prior to your procedure. Marland Kitchen Antibiotics: Inform the staff if you have a condition or reason that requires you to take antibiotics before dental procedures. . Pregnancy: If you are pregnant, call and cancel the procedure. . Sickness: If you have a cold, fever, or any active infections, call and cancel the procedure. . Arrival: You must be in the facility at least 30 minutes prior to your scheduled procedure. . Children: Do not bring children with you. . Dress appropriately: Bring dark clothing that you would not mind if they get stained. . Valuables: Do not bring any jewelry or valuables. Procedure appointments are reserved for interventional treatments only. Marland Kitchen No Prescription Refills. . No medication changes will be discussed during procedure appointments. . No disability issues will be discussed. ______________________________________________________________________________________________  Preparing for Procedure with Sedation Instructions: . Oral Intake: Do not eat or drink anything for at least 8 hours prior to your procedure. . Transportation: Public transportation is not allowed. Bring an adult driver. The driver must be physically present in our waiting room before any procedure can  be started. Marland Kitchen Physical Assistance: Bring an adult capable of physically assisting you, in the event you need help. . Blood Pressure Medicine: Take your blood pressure medicine with a sip of water the morning of the procedure. . Insulin: Take only  of your normal insulin dose. . Preventing infections: Shower with an antibacterial soap the morning of your procedure. . Build-up your immune system: Take 1000 mg of Vitamin C with every meal (3 times a day) the day prior to your procedure. . Pregnancy: If you are pregnant, call and cancel the procedure. . Sickness: If you have a cold, fever, or any active infections, call and cancel the procedure. . Arrival: You must be in the facility at least 30 minutes prior to your scheduled procedure. . Children: Do not bring children with you. . Dress appropriately: Bring dark clothing that you would not mind if they get stained. . Valuables: Do not bring any jewelry or valuables. Procedure appointments are reserved for interventional treatments only. Marland Kitchen No Prescription Refills. . No medication changes will be discussed during procedure appointments. No disability issues will be discussed.Facet Blocks Patient Information  Description: The facets are joints in the spine between the vertebrae.  Like any joints in the body, facets can become irritated and painful.  Arthritis can also effect the facets.  By injecting steroids and local anesthetic in and around these joints, we can temporarily block the nerve supply to them.  Steroids act directly on irritated nerves and tissues to reduce selling and inflammation which often leads to decreased pain.  Facet blocks may be done anywhere along the spine from the neck to the low back depending upon the location of your pain.   After numbing the skin with local anesthetic (like Novocaine), a small needle is passed onto the facet joints under x-ray guidance.  You may experience a sensation of pressure while this is being  done.  The entire block usually lasts about 15-25 minutes.   Conditions which may be treated by facet blocks:   Low back/buttock pain  Neck/shoulder pain  Certain types of headaches  Preparation for the injection:  1. Do not eat any solid food or dairy products within 8 hours of your appointment. 2. You may drink clear liquid up to 3  hours before appointment.  Clear liquids include water, black coffee, juice or soda.  No milk or cream please. 3. You may take your regular medication, including pain medications, with a sip of water before your appointment.  Diabetics should hold regular insulin (if taken separately) and take 1/2 normal NPH dose the morning of the procedure.  Carry some sugar containing items with you to your appointment. 4. A driver must accompany you and be prepared to drive you home after your procedure. 5. Bring all your current medications with you. 6. An IV may be inserted and sedation may be given at the discretion of the physician. 7. A blood pressure cuff, EKG and other monitors will often be applied during the procedure.  Some patients may need to have extra oxygen administered for a short period. 8. You will be asked to provide medical information, including your allergies and medications, prior to the procedure.  We must know immediately if you are taking blood thinners (like Coumadin/Warfarin) or if you are allergic to IV iodine contrast (dye).  We must know if you could possible be pregnant.  Possible side-effects:   Bleeding from needle site  Infection (rare, may require surgery)  Nerve injury (rare)  Numbness & tingling (temporary)  Difficulty urinating (rare, temporary)  Spinal headache (a headache worse with upright posture)  Light-headedness (temporary)  Pain at injection site (serveral days)  Decreased blood pressure (rare, temporary)  Weakness in arm/leg (temporary)  Pressure sensation in back/neck (temporary)   Call if you  experience:   Fever/chills associated with headache or increased back/neck pain  Headache worsened by an upright position  New onset, weakness or numbness of an extremity below the injection site  Hives or difficulty breathing (go to the emergency room)  Inflammation or drainage at the injection site(s)  Severe back/neck pain greater than usual  New symptoms which are concerning to you  Please note:  Although the local anesthetic injected can often make your back or neck feel good for several hours after the injection, the pain will likely return. It takes 3-7 days for steroids to work.  You may not notice any pain relief for at least one week.  If effective, we will often do a series of 2-3 injections spaced 3-6 weeks apart to maximally decrease your pain.  After the initial series, you may be a candidate for a more permanent nerve block of the facets.  If you have any questions, please call #[phone]) [facility]

## 2017-03-07 ENCOUNTER — Encounter: Payer: Self-pay | Admitting: Pain Medicine

## 2017-03-07 ENCOUNTER — Ambulatory Visit: Payer: PPO | Attending: Pain Medicine | Admitting: Pain Medicine

## 2017-03-07 VITALS — BP 123/72 | HR 66 | Temp 97.9°F | Resp 18 | Ht 72.0 in | Wt 240.0 lb

## 2017-03-07 DIAGNOSIS — J45909 Unspecified asthma, uncomplicated: Secondary | ICD-10-CM | POA: Diagnosis not present

## 2017-03-07 DIAGNOSIS — M47816 Spondylosis without myelopathy or radiculopathy, lumbar region: Secondary | ICD-10-CM

## 2017-03-07 DIAGNOSIS — F1190 Opioid use, unspecified, uncomplicated: Secondary | ICD-10-CM

## 2017-03-07 DIAGNOSIS — M479 Spondylosis, unspecified: Secondary | ICD-10-CM | POA: Diagnosis not present

## 2017-03-07 DIAGNOSIS — Z79891 Long term (current) use of opiate analgesic: Secondary | ICD-10-CM

## 2017-03-07 DIAGNOSIS — M5416 Radiculopathy, lumbar region: Secondary | ICD-10-CM | POA: Diagnosis not present

## 2017-03-07 DIAGNOSIS — M79601 Pain in right arm: Secondary | ICD-10-CM | POA: Diagnosis not present

## 2017-03-07 DIAGNOSIS — E039 Hypothyroidism, unspecified: Secondary | ICD-10-CM | POA: Diagnosis not present

## 2017-03-07 DIAGNOSIS — M329 Systemic lupus erythematosus, unspecified: Secondary | ICD-10-CM | POA: Diagnosis not present

## 2017-03-07 DIAGNOSIS — M542 Cervicalgia: Secondary | ICD-10-CM | POA: Diagnosis not present

## 2017-03-07 DIAGNOSIS — D649 Anemia, unspecified: Secondary | ICD-10-CM | POA: Insufficient documentation

## 2017-03-07 DIAGNOSIS — M545 Low back pain: Secondary | ICD-10-CM | POA: Diagnosis not present

## 2017-03-07 DIAGNOSIS — E669 Obesity, unspecified: Secondary | ICD-10-CM | POA: Insufficient documentation

## 2017-03-07 DIAGNOSIS — M4696 Unspecified inflammatory spondylopathy, lumbar region: Secondary | ICD-10-CM

## 2017-03-07 DIAGNOSIS — M25551 Pain in right hip: Secondary | ICD-10-CM | POA: Insufficient documentation

## 2017-03-07 DIAGNOSIS — M79604 Pain in right leg: Secondary | ICD-10-CM | POA: Diagnosis not present

## 2017-03-07 DIAGNOSIS — M47812 Spondylosis without myelopathy or radiculopathy, cervical region: Secondary | ICD-10-CM | POA: Insufficient documentation

## 2017-03-07 DIAGNOSIS — E119 Type 2 diabetes mellitus without complications: Secondary | ICD-10-CM | POA: Insufficient documentation

## 2017-03-07 DIAGNOSIS — I10 Essential (primary) hypertension: Secondary | ICD-10-CM | POA: Insufficient documentation

## 2017-03-07 DIAGNOSIS — I471 Supraventricular tachycardia: Secondary | ICD-10-CM | POA: Insufficient documentation

## 2017-03-07 DIAGNOSIS — I4891 Unspecified atrial fibrillation: Secondary | ICD-10-CM | POA: Diagnosis not present

## 2017-03-07 DIAGNOSIS — K219 Gastro-esophageal reflux disease without esophagitis: Secondary | ICD-10-CM | POA: Diagnosis not present

## 2017-03-07 DIAGNOSIS — R079 Chest pain, unspecified: Secondary | ICD-10-CM | POA: Diagnosis not present

## 2017-03-07 DIAGNOSIS — M25562 Pain in left knee: Secondary | ICD-10-CM | POA: Insufficient documentation

## 2017-03-07 DIAGNOSIS — G5640 Causalgia of unspecified upper limb: Secondary | ICD-10-CM | POA: Diagnosis not present

## 2017-03-07 DIAGNOSIS — G8929 Other chronic pain: Secondary | ICD-10-CM

## 2017-03-07 DIAGNOSIS — I491 Atrial premature depolarization: Secondary | ICD-10-CM | POA: Diagnosis not present

## 2017-03-07 DIAGNOSIS — M25552 Pain in left hip: Secondary | ICD-10-CM | POA: Diagnosis not present

## 2017-03-07 DIAGNOSIS — K759 Inflammatory liver disease, unspecified: Secondary | ICD-10-CM

## 2017-03-07 DIAGNOSIS — M79605 Pain in left leg: Secondary | ICD-10-CM

## 2017-03-07 DIAGNOSIS — G894 Chronic pain syndrome: Secondary | ICD-10-CM | POA: Diagnosis not present

## 2017-03-07 DIAGNOSIS — M8580 Other specified disorders of bone density and structure, unspecified site: Secondary | ICD-10-CM | POA: Insufficient documentation

## 2017-03-07 DIAGNOSIS — M5412 Radiculopathy, cervical region: Secondary | ICD-10-CM | POA: Diagnosis not present

## 2017-03-07 DIAGNOSIS — L4050 Arthropathic psoriasis, unspecified: Secondary | ICD-10-CM | POA: Insufficient documentation

## 2017-03-07 DIAGNOSIS — I482 Chronic atrial fibrillation, unspecified: Secondary | ICD-10-CM

## 2017-03-07 DIAGNOSIS — R200 Anesthesia of skin: Secondary | ICD-10-CM | POA: Diagnosis not present

## 2017-03-07 DIAGNOSIS — M8589 Other specified disorders of bone density and structure, multiple sites: Secondary | ICD-10-CM

## 2017-03-07 DIAGNOSIS — R931 Abnormal findings on diagnostic imaging of heart and coronary circulation: Secondary | ICD-10-CM

## 2017-03-07 DIAGNOSIS — Z833 Family history of diabetes mellitus: Secondary | ICD-10-CM | POA: Insufficient documentation

## 2017-03-07 MED ORDER — FENTANYL 12 MCG/HR TD PT72: 12.5000 ug | MEDICATED_PATCH | TRANSDERMAL | 0 refills | Status: DC

## 2017-03-07 MED ORDER — OXYCODONE HCL 20 MG PO TABS: 20.0000 mg | ORAL_TABLET | Freq: Three times a day (TID) | ORAL | 0 refills | Status: DC | PRN

## 2017-03-07 MED ORDER — OXYCODONE HCL 20 MG PO TABS: 20.0000 mg | ORAL_TABLET | Freq: Three times a day (TID) | ORAL | 0 refills | Status: DC

## 2017-03-07 NOTE — Progress Notes (Signed)
Nursing Pain Medication Assessment:  Safety precautions to be maintained throughout the outpatient stay will include: orient to surroundings, keep bed in low position, maintain call bell within reach at all times, provide assistance with transfer out of bed and ambulation.  Medication Inspection Compliance: Pill count conducted under aseptic conditions, in front of the patient. Neither the pills nor the bottle was removed from the patient's sight at any time. Once count was completed pills were immediately returned to the patient in their original bottle.  Medication #1:  Oxycodone Pill/Patch Count: 66 of 90 pills remain Pill/Patch Appearance: Markings consistent with prescribed medication Bottle Appearance: Standard pharmacy container. Clearly labeled. Filled Date: 04 / 25 / 2018 Last Medication intake:  Today  Medication #2: Fentanyl patch Pill/Patch Count: 7 of 10 patches remain Pill/Patch Appearance: Markings consistent with prescribed medication Bottle Appearance: Standard pharmacy container. Clearly labeled. Filled Date: 04/ 25 / 2018 Last Medication intake:  Today

## 2017-03-07 NOTE — Patient Instructions (Addendum)
You were given 3 prescriptions for oxycodone and 3 prescriptions for Duragesic patch today.  You will be scheduled for a procedure with sedation.  You were informed to not eat or drink for 8 hours prior to procedure, bring a driver and take blood pressure medication the morning of the procedure.     Preparing for Procedure with Sedation Instructions: . Oral Intake: Do not eat or drink anything for at least 8 hours prior to your procedure. . Transportation: Public transportation is not allowed. Bring an adult driver. The driver must be physically present in our waiting room before any procedure can be started. Marland Kitchen Physical Assistance: Bring an adult physically capable of assisting you, in the event you need help. This adult should keep you company at home for at least 6 hours after the procedure. . Blood Pressure Medicine: Take your blood pressure medicine with a sip of water the morning of the procedure. . Blood thinners:  . Diabetics on insulin: Notify the staff so that you can be scheduled 1st case in the morning. If your diabetes requires high dose insulin, take only  of your normal insulin dose the morning of the procedure and notify the staff that you have done so. . Preventing infections: Shower with an antibacterial soap the morning of your procedure. . Build-up your immune system: Take 1000 mg of Vitamin C with every meal (3 times a day) the day prior to your procedure. Marland Kitchen Antibiotics: Inform the staff if you have a condition or reason that requires you to take antibiotics before dental procedures. . Pregnancy: If you are pregnant, call and cancel the procedure. . Sickness: If you have a cold, fever, or any active infections, call and cancel the procedure. . Arrival: You must be in the facility at least 30 minutes prior to your scheduled procedure. . Children: Do not bring children with you. . Dress appropriately: Bring dark clothing that you would not mind if they get  stained. . Valuables: Do not bring any jewelry or valuables. Procedure appointments are reserved for interventional treatments only. Marland Kitchen No Prescription Refills. . No medication changes will be discussed during procedure appointments. . No disability issues will be discussed. ______________________________________________________________________________________________  Preparing for Procedure with Sedation Instructions: . Oral Intake: Do not eat or drink anything for at least 8 hours prior to your procedure. . Transportation: Public transportation is not allowed. Bring an adult driver. The driver must be physically present in our waiting room before any procedure can be started. Marland Kitchen Physical Assistance: Bring an adult capable of physically assisting you, in the event you need help. . Blood Pressure Medicine: Take your blood pressure medicine with a sip of water the morning of the procedure. . Insulin: Take only  of your normal insulin dose. . Preventing infections: Shower with an antibacterial soap the morning of your procedure. . Build-up your immune system: Take 1000 mg of Vitamin C with every meal (3 times a day) the day prior to your procedure. . Pregnancy: If you are pregnant, call and cancel the procedure. . Sickness: If you have a cold, fever, or any active infections, call and cancel the procedure. . Arrival: You must be in the facility at least 30 minutes prior to your scheduled procedure. . Children: Do not bring children with you. . Dress appropriately: Bring dark clothing that you would not mind if they get stained. . Valuables: Do not bring any jewelry or valuables. Procedure appointments are reserved for interventional treatments only. Marland Kitchen No  Prescription Refills. . No medication changes will be discussed during procedure appointments. No disability issues will be discussed.Facet Blocks Patient Information  Description: The facets are joints in the spine between the vertebrae.   Like any joints in the body, facets can become irritated and painful.  Arthritis can also effect the facets.  By injecting steroids and local anesthetic in and around these joints, we can temporarily block the nerve supply to them.  Steroids act directly on irritated nerves and tissues to reduce selling and inflammation which often leads to decreased pain.  Facet blocks may be done anywhere along the spine from the neck to the low back depending upon the location of your pain.   After numbing the skin with local anesthetic (like Novocaine), a small needle is passed onto the facet joints under x-ray guidance.  You may experience a sensation of pressure while this is being done.  The entire block usually lasts about 15-25 minutes.   Conditions which may be treated by facet blocks:   Low back/buttock pain  Neck/shoulder pain  Certain types of headaches  Preparation for the injection:  1. Do not eat any solid food or dairy products within 8 hours of your appointment. 2. You may drink clear liquid up to 3 hours before appointment.  Clear liquids include water, black coffee, juice or soda.  No milk or cream please. 3. You may take your regular medication, including pain medications, with a sip of water before your appointment.  Diabetics should hold regular insulin (if taken separately) and take 1/2 normal NPH dose the morning of the procedure.  Carry some sugar containing items with you to your appointment. 4. A driver must accompany you and be prepared to drive you home after your procedure. 5. Bring all your current medications with you. 6. An IV may be inserted and sedation may be given at the discretion of the physician. 7. A blood pressure cuff, EKG and other monitors will often be applied during the procedure.  Some patients may need to have extra oxygen administered for a short period. 8. You will be asked to provide medical information, including your allergies and medications, prior to the  procedure.  We must know immediately if you are taking blood thinners (like Coumadin/Warfarin) or if you are allergic to IV iodine contrast (dye).  We must know if you could possible be pregnant.  Possible side-effects:   Bleeding from needle site  Infection (rare, may require surgery)  Nerve injury (rare)  Numbness & tingling (temporary)  Difficulty urinating (rare, temporary)  Spinal headache (a headache worse with upright posture)  Light-headedness (temporary)  Pain at injection site (serveral days)  Decreased blood pressure (rare, temporary)  Weakness in arm/leg (temporary)  Pressure sensation in back/neck (temporary)   Call if you experience:   Fever/chills associated with headache or increased back/neck pain  Headache worsened by an upright position  New onset, weakness or numbness of an extremity below the injection site  Hives or difficulty breathing (go to the emergency room)  Inflammation or drainage at the injection site(s)  Severe back/neck pain greater than usual  New symptoms which are concerning to you  Please note:  Although the local anesthetic injected can often make your back or neck feel good for several hours after the injection, the pain will likely return. It takes 3-7 days for steroids to work.  You may not notice any pain relief for at least one week.  If effective, we will often do  a series of 2-3 injections spaced 3-6 weeks apart to maximally decrease your pain.  After the initial series, you may be a candidate for a more permanent nerve block of the facets.  If you have any questions, please call #[phone]) [facility]

## 2017-03-13 ENCOUNTER — Encounter: Payer: Self-pay | Admitting: *Deleted

## 2017-03-14 ENCOUNTER — Encounter (INDEPENDENT_AMBULATORY_CARE_PROVIDER_SITE_OTHER): Payer: PPO

## 2017-03-14 DIAGNOSIS — G4733 Obstructive sleep apnea (adult) (pediatric): Secondary | ICD-10-CM | POA: Diagnosis not present

## 2017-03-25 DIAGNOSIS — M19019 Primary osteoarthritis, unspecified shoulder: Secondary | ICD-10-CM | POA: Diagnosis not present

## 2017-03-25 DIAGNOSIS — M7541 Impingement syndrome of right shoulder: Secondary | ICD-10-CM | POA: Diagnosis not present

## 2017-03-25 DIAGNOSIS — M7542 Impingement syndrome of left shoulder: Secondary | ICD-10-CM | POA: Diagnosis not present

## 2017-04-04 DIAGNOSIS — L409 Psoriasis, unspecified: Secondary | ICD-10-CM | POA: Diagnosis not present

## 2017-04-04 DIAGNOSIS — M25562 Pain in left knee: Secondary | ICD-10-CM | POA: Diagnosis not present

## 2017-04-04 DIAGNOSIS — M25561 Pain in right knee: Secondary | ICD-10-CM | POA: Diagnosis not present

## 2017-04-04 DIAGNOSIS — M150 Primary generalized (osteo)arthritis: Secondary | ICD-10-CM | POA: Diagnosis not present

## 2017-04-04 DIAGNOSIS — M1990 Unspecified osteoarthritis, unspecified site: Secondary | ICD-10-CM | POA: Diagnosis not present

## 2017-04-04 DIAGNOSIS — G8929 Other chronic pain: Secondary | ICD-10-CM | POA: Diagnosis not present

## 2017-04-14 DIAGNOSIS — G4733 Obstructive sleep apnea (adult) (pediatric): Secondary | ICD-10-CM | POA: Diagnosis not present

## 2017-04-15 ENCOUNTER — Ambulatory Visit
Admission: RE | Admit: 2017-04-15 | Discharge: 2017-04-15 | Disposition: A | Discharge status: Home / Self Care | Payer: PPO | Source: Ambulatory Visit | Attending: Pain Medicine | Admitting: Pain Medicine

## 2017-04-15 ENCOUNTER — Ambulatory Visit (HOSPITAL_BASED_OUTPATIENT_CLINIC_OR_DEPARTMENT_OTHER): Payer: PPO | Admitting: Pain Medicine

## 2017-04-15 ENCOUNTER — Encounter: Payer: Self-pay | Admitting: Pain Medicine

## 2017-04-15 VITALS — BP 101/63 | HR 70 | Temp 98.1°F | Resp 14 | Ht 72.0 in | Wt 245.0 lb

## 2017-04-15 DIAGNOSIS — M47816 Spondylosis without myelopathy or radiculopathy, lumbar region: Secondary | ICD-10-CM | POA: Diagnosis not present

## 2017-04-15 DIAGNOSIS — M545 Low back pain, unspecified: Secondary | ICD-10-CM

## 2017-04-15 DIAGNOSIS — M4696 Unspecified inflammatory spondylopathy, lumbar region: Secondary | ICD-10-CM

## 2017-04-15 DIAGNOSIS — R110 Nausea: Secondary | ICD-10-CM | POA: Insufficient documentation

## 2017-04-15 DIAGNOSIS — G8929 Other chronic pain: Secondary | ICD-10-CM | POA: Insufficient documentation

## 2017-04-15 DIAGNOSIS — Z9689 Presence of other specified functional implants: Secondary | ICD-10-CM | POA: Insufficient documentation

## 2017-04-15 DIAGNOSIS — Z9889 Other specified postprocedural states: Secondary | ICD-10-CM | POA: Diagnosis not present

## 2017-04-15 MED ORDER — ONDANSETRON HCL 4 MG/2ML IJ SOLN
4.0000 mg | INTRAMUSCULAR | Status: DC | PRN
  Administered 2017-04-15: 4 mg via INTRAVENOUS

## 2017-04-15 MED ORDER — ROPIVACAINE HCL 2 MG/ML IJ SOLN
9.0000 mL | Freq: Once | INTRAMUSCULAR | Status: AC
  Administered 2017-04-15: 10 mL via PERINEURAL
  Filled 2017-04-15: qty 10

## 2017-04-15 MED ORDER — TRIAMCINOLONE ACETONIDE 40 MG/ML IJ SUSP
40.0000 mg | Freq: Once | INTRAMUSCULAR | Status: AC
  Administered 2017-04-15: 40 mg
  Filled 2017-04-15: qty 1

## 2017-04-15 MED ORDER — LIDOCAINE HCL (PF) 1 % IJ SOLN
10.0000 mL | Freq: Once | INTRAMUSCULAR | Status: AC
  Administered 2017-04-15: 5 mL
  Filled 2017-04-15: qty 10

## 2017-04-15 MED ORDER — ONDANSETRON HCL 4 MG/2ML IJ SOLN
INTRAMUSCULAR | Status: AC
  Filled 2017-04-15: qty 2

## 2017-04-15 MED ORDER — FENTANYL CITRATE (PF) 100 MCG/2ML IJ SOLN
25.0000 ug | INTRAMUSCULAR | Status: DC | PRN
  Administered 2017-04-15: 100 ug via INTRAVENOUS
  Filled 2017-04-15: qty 2

## 2017-04-15 MED ORDER — LACTATED RINGERS IV SOLN
1000.0000 mL | Freq: Once | INTRAVENOUS | Status: AC
  Administered 2017-04-15: 1000 mL via INTRAVENOUS

## 2017-04-15 MED ORDER — MIDAZOLAM HCL 5 MG/5ML IJ SOLN
1.0000 mg | INTRAMUSCULAR | Status: DC | PRN
  Administered 2017-04-15: 3 mg via INTRAVENOUS
  Filled 2017-04-15: qty 5

## 2017-04-15 NOTE — Progress Notes (Signed)
Safety precautions to be maintained throughout the outpatient stay will include: orient to surroundings, keep bed in low position, maintain call bell within reach at all times, provide assistance with transfer out of bed and ambulation.  

## 2017-04-15 NOTE — Progress Notes (Signed)
Patient's Name: James [name]  MRN: [id number]  Referring Provider: [name], MD  DOB: [birth year]  PCP: [name], MD  DOS: 04/15/2017  Note by: Kathlen Brunswick. Dossie Arbour, MD  Service setting: Ambulatory outpatient  Location: [facility] (AMB) Pain Management Facility  Visit type: Procedure  Specialty: Interventional Pain Management  Patient type: Established   Primary Reason for Visit: Interventional Pain Management Treatment. CC: Back Pain (low)  Procedure:  Anesthesia, Analgesia, Anxiolysis:  Type: Diagnostic Medial Branch Facet Block Region: Lumbar Level: L2, L3, L4, L5, & S1 Medial Branch Level(s) Laterality: Bilateral  Type: Local Anesthesia with Moderate (Conscious) Sedation Local Anesthetic: Lidocaine 1% Route: Intravenous (IV) IV Access: Secured Sedation: Meaningful verbal contact was maintained at all times during the procedure  Indication(s): Analgesia and Anxiety  Indications: 1. Lumbar facet syndrome (Location of Primary Source of Pain) (Bilateral) (R>L)   2. Lumbar spondylosis   3. Chronic low back pain (Location of Primary Source of Pain) (Bilateral) (R>L)   4. Nausea without vomiting    Pain Score: Pre-procedure: 4 /10 Post-procedure: 0-No pain/10  Pre-op Assessment:  Previous date of service: 03/07/17 Service provided: Med Refill James [name] is a 61 y.o. (year old), male patient, seen today for interventional treatment. He  has a past surgical history that includes Spine surgery; Eye surgery; Fracture surgery (Right); Fracture surgery (Bilateral); Fracture surgery (Bilateral); Joint replacement (Right); Spinal cord stimulator implant; Spinal cord stimulator removal; Patella arthroplasty; Shoulder arthroscopy (Bilateral); Esophagogastroduodenoscopy (egd) with propofol (N/A, 01/20/2016); and Colonoscopy (2009). His primarily concern today is the Back Pain (low)  Initial Vital Signs: There were no vitals taken for this visit. BMI: 33.23 kg/m  Risk  Assessment: Allergies: Reviewed. He is allergic to penicillins; lactose; nsaids; prednisone; procaine; sulfasalazine; talwin [pentazocine]; codeine; and sulfa antibiotics.  Allergy Precautions: None required Coagulopathies: Reviewed. None identified.  Blood-thinner therapy: None at this time Active Infection(s): Reviewed. None identified. James [name] is afebrile  Site Confirmation: James [name] was asked to confirm the procedure and laterality before marking the site Procedure checklist: Completed Consent: Before the procedure and under the influence of no sedative(s), amnesic(s), or anxiolytics, the patient was informed of the treatment options, risks and possible complications. To fulfill our ethical and legal obligations, as recommended by the American Medical Association's Code of Ethics, I have informed the patient of my clinical impression; the nature and purpose of the treatment or procedure; the risks, benefits, and possible complications of the intervention; the alternatives, including doing nothing; the risk(s) and benefit(s) of the alternative treatment(s) or procedure(s); and the risk(s) and benefit(s) of doing nothing. The patient was provided information about the general risks and possible complications associated with the procedure. These may include, but are not limited to: failure to achieve desired goals, infection, bleeding, organ or nerve damage, allergic reactions, paralysis, and death. In addition, the patient was informed of those risks and complications associated to Spine-related procedures, such as failure to decrease pain; infection (i.e.: Meningitis, epidural or intraspinal abscess); bleeding (i.e.: epidural hematoma, subarachnoid hemorrhage, or any other type of intraspinal or peri-dural bleeding); organ or nerve damage (i.e.: Any type of peripheral nerve, nerve root, or spinal cord injury) with subsequent damage to sensory, motor, and/or autonomic systems, resulting in  permanent pain, numbness, and/or weakness of one or several areas of the body; allergic reactions; (i.e.: anaphylactic reaction); and/or death. Furthermore, the patient was informed of those risks and complications associated with the medications. These include, but are not limited to: allergic reactions (i.e.: anaphylactic or  anaphylactoid reaction(s)); adrenal axis suppression; blood sugar elevation that in diabetics may result in ketoacidosis or comma; water retention that in patients with history of congestive heart failure may result in shortness of breath, pulmonary edema, and decompensation with resultant heart failure; weight gain; swelling or edema; medication-induced neural toxicity; particulate matter embolism and blood vessel occlusion with resultant organ, and/or nervous system infarction; and/or aseptic necrosis of one or more joints. Finally, the patient was informed that Medicine is not an exact science; therefore, there is also the possibility of unforeseen or unpredictable risks and/or possible complications that may result in a catastrophic outcome. The patient indicated having understood very clearly. We have given the patient no guarantees and we have made no promises. Enough time was given to the patient to ask questions, all of which were answered to the patient's satisfaction. James [name] has indicated that he wanted to continue with the procedure. Attestation: I, the ordering provider, attest that I have discussed with the patient the benefits, risks, side-effects, alternatives, likelihood of achieving goals, and potential problems during recovery for the procedure that I have provided informed consent. Date: 04/15/2017; Time: 7:55 AM  Pre-Procedure Preparation:  Monitoring: As per clinic protocol. Respiration, ETCO2, SpO2, BP, heart rate and rhythm monitor placed and checked for adequate function Safety Precautions: Patient was assessed for positional comfort and pressure points  before starting the procedure. Time-out: I initiated and conducted the "Time-out" before starting the procedure, as per protocol. The patient was asked to participate by confirming the accuracy of the "Time Out" information. Verification of the correct person, site, and procedure were performed and confirmed by me, the nursing staff, and the patient. "Time-out" conducted as per Joint Commission's Universal Protocol (UP.01.01.01). "Time-out" Date & Time: 04/15/2017; 0901 hrs.  Description of Procedure Process:   Position: Prone Target Area: For Lumbar Facet blocks, the target is the groove formed by the junction of the transverse process and superior articular process. For the L5 dorsal ramus, the target is the notch between superior articular process and sacral ala. For the S1 dorsal ramus, the target is the superior and lateral edge of the posterior S1 Sacral foramen. Approach: Paramedial approach. Area Prepped: Entire Posterior Lumbosacral Region Prepping solution: ChloraPrep (2% chlorhexidine gluconate and 70% isopropyl alcohol) Safety Precautions: Aspiration looking for blood return was conducted prior to all injections. At no point did we inject any substances, as a needle was being advanced. No attempts were made at seeking any paresthesias. Safe injection practices and needle disposal techniques used. Medications properly checked for expiration dates. SDV (single dose vial) medications used. Description of the Procedure: Protocol guidelines were followed. The patient was placed in position over the fluoroscopy table. The target area was identified and the area prepped in the usual manner. Skin desensitized using vapocoolant spray. Skin & deeper tissues infiltrated with local anesthetic. Appropriate amount of time allowed to pass for local anesthetics to take effect. The procedure needle was introduced through the skin, ipsilateral to the reported pain, and advanced to the target area. Employing the  "Medial Branch Technique", the needles were advanced to the angle made by the superior and medial portion of the transverse process, and the lateral and inferior portion of the superior articulating process of the targeted vertebral bodies. This area is known as "Burton's Eye" or the "Eye of the [place] Dog". A procedure needle was introduced through the skin, and this time advanced to the angle made by the superior and medial border of the sacral ala,  and the lateral border of the S1 vertebral body. This last needle was later repositioned at the superior and lateral border of the posterior S1 foramen. Negative aspiration confirmed. Solution injected in intermittent fashion, asking for systemic symptoms every 0.5cc of injectate. The needles were then removed and the area cleansed, making sure to leave some of the prepping solution back to take advantage of its long term bactericidal properties. Vitals:   04/15/17 0913 04/15/17 0923 04/15/17 0933 04/15/17 0943  BP: (!) 135/91 117/73 (!) 145/75 101/63  Pulse:      Resp: 11 14 12 14   Temp:  97.3 F (36.3 C)  98.1 F (36.7 C)  SpO2: 99% 100% 97% 98%  Weight:      Height:        Start Time: 0901 hrs. End Time: 0913 hrs.  Illustration of the posterior view of the lumbar spine and the posterior neural structures. Laminae of L2 through S1 are labeled. DPRL5, dorsal primary ramus of L5; DPRS1, dorsal primary ramus of S1; DPR3, dorsal primary ramus of L3; FJ, facet (zygapophyseal) joint L3-L4; I, inferior articular process of L4; LB1, lateral branch of dorsal primary ramus of L1; IAB, inferior articular branches from L3 medial branch (supplies L4-L5 facet joint); IBP, intermediate branch plexus; MB3, medial branch of dorsal primary ramus of L3; NR3, third lumbar nerve root; S, superior articular process of L5; SAB, superior articular branches from L4 (supplies L4-5 facet joint also); TP3, transverse process of L3.  Materials:  Needle(s) Type: Regular  needle Gauge: 22G Length: 3.5-in Medication(s): We administered lactated ringers, midazolam, fentaNYL, lidocaine (PF), triamcinolone acetonide, ropivacaine (PF) 2 mg/mL (0.2%), lidocaine (PF), triamcinolone acetonide, ropivacaine (PF) 2 mg/mL (0.2%), and ondansetron. Please see chart orders for dosing details.  Imaging Guidance (Spinal):  Type of Imaging Technique: Fluoroscopy Guidance (Spinal) Indication(s): Assistance in needle guidance and placement for procedures requiring needle placement in or near specific anatomical locations not easily accessible without such assistance. Exposure Time: Please see nurses notes. Contrast: None used. Fluoroscopic Guidance: I was personally present during the use of fluoroscopy. "Tunnel Vision Technique" used to obtain the best possible view of the target area. Parallax error corrected before commencing the procedure. "Direction-depth-direction" technique used to introduce the needle under continuous pulsed fluoroscopy. Once target was reached, antero-posterior, oblique, and lateral fluoroscopic projection used confirm needle placement in all planes. Images permanently stored in EMR. Interpretation: No contrast injected. I personally interpreted the imaging intraoperatively. Adequate needle placement confirmed in multiple planes. Permanent images saved into the patient's record.  Antibiotic Prophylaxis:  Indication(s): None identified Antibiotic given: None  Post-operative Assessment:  EBL: None Complications: No immediate post-treatment complications observed by team, or reported by patient. Note: The patient tolerated the entire procedure well. A repeat set of vitals were taken after the procedure and the patient was kept under observation following institutional policy, for this type of procedure. Post-procedural neurological assessment was performed, showing return to baseline, prior to discharge. The patient was provided with post-procedure discharge  instructions, including a section on how to identify potential problems. Should any problems arise concerning this procedure, the patient was given instructions to immediately contact us, at any time, without hesitation. In any case, we plan to contact the patient by telephone for a follow-up status report regarding this interventional procedure. Comments:  No additional relevant information.  Plan of Care  Disposition: Discharge home  Discharge Date & Time: 04/15/2017; 0945 hrs.  Physician-requested Follow-up:  Return in about 2 weeks (around 04/29/2017) for  post-procedure eval (in 2 wks), by MD, in addition, Med-Mgmt, by NP.  Future Appointments Date Time Provider [place]  05/02/2017 8:00 AM Milinda Pointer, MD [facility] None  05/30/2017 8:30 AM Vevelyn Francois, NP [facility] None   Medications ordered for procedure: Meds ordered this encounter  Medications  . lactated ringers infusion 1,000 mL  . midazolam (VERSED) 5 MG/5ML injection 1-2 mg    Make sure Flumazenil is available in the pyxis when using this medication. If oversedation occurs, administer 0.2 mg IV over 15 sec. If after 45 sec no response, administer 0.2 mg again over 1 min; may repeat at 1 min intervals; not to exceed 4 doses (1 mg)  . fentaNYL (SUBLIMAZE) injection 25-50 mcg    Make sure Narcan is available in the pyxis when using this medication. In the event of respiratory depression (RR< 8/min): Titrate NARCAN (naloxone) in increments of 0.1 to 0.2 mg IV at 2-3 minute intervals, until desired degree of reversal.  . lidocaine (PF) (XYLOCAINE) 1 % injection 10 mL  . triamcinolone acetonide (KENALOG-40) injection 40 mg  . ropivacaine (PF) 2 mg/mL (0.2%) (NAROPIN) injection 9 mL  . lidocaine (PF) (XYLOCAINE) 1 % injection 10 mL  . triamcinolone acetonide (KENALOG-40) injection 40 mg  . ropivacaine (PF) 2 mg/mL (0.2%) (NAROPIN) injection 9 mL  . ondansetron (ZOFRAN) injection 4 mg   Medications  administered: We administered lactated ringers, midazolam, fentaNYL, lidocaine (PF), triamcinolone acetonide, ropivacaine (PF) 2 mg/mL (0.2%), lidocaine (PF), triamcinolone acetonide, ropivacaine (PF) 2 mg/mL (0.2%), and ondansetron.  See the medical record for exact dosing, route, and time of administration.  Lab-work, Procedure(s), & Referral(s) Ordered: Orders Placed This Encounter  Procedures  . LUMBAR FACET(MEDIAL BRANCH NERVE BLOCK) MBNB  . DG C-Arm 1-60 Min-No Report  . Informed Consent Details: Transcribe to consent form and obtain patient signature  . Provider attestation of informed consent for procedure/surgical case  . Verify informed consent  . Discharge instructions  . Follow-up   Imaging Ordered: New Prescriptions   No medications on file   Primary Care Physician: [name], MD Location: [facility] Note by: Kathlen Brunswick. Dossie Arbour, M.D, DABA, DABAPM, DABPM, DABIPP, FIPP Date: 04/15/2017; Time: 1:38 PM  Disclaimer:  Medicine is not an Chief Strategy Officer. The only guarantee in medicine is that nothing is guaranteed. It is important to note that the decision to proceed with this intervention was based on the information collected from the patient. The Data and conclusions were drawn from the patient's questionnaire, the interview, and the physical examination. Because the information was provided in large part by the patient, it cannot be guaranteed that it has not been purposely or unconsciously manipulated. Every effort has been made to obtain as much relevant data as possible for this evaluation. It is important to note that the conclusions that lead to this procedure are derived in large part from the available data. Always take into account that the treatment will also be dependent on availability of resources and existing treatment guidelines, considered by other Pain Management Practitioners as being common knowledge and practice, at the time of  the intervention. For Medico-Legal purposes, it is also important to point out that variation in procedural techniques and pharmacological choices are the acceptable norm. The indications, contraindications, technique, and results of the above procedure should only be interpreted and judged by a Board-Certified Interventional Pain Specialist with extensive familiarity and expertise in the same exact procedure and technique.  Instructions provided at this appointment:  Patient Instructions   ____________________________________________________________________________________________  Post-Procedure instructions Instructions:  Apply ice: Fill a plastic sandwich bag with crushed ice. Cover it with a small towel and apply to injection site. Apply for 15 minutes then remove x 15 minutes. Repeat sequence on day of procedure, until you go to bed. The purpose is to minimize swelling and discomfort after procedure.  Apply heat: Apply heat to procedure site starting the day following the procedure. The purpose is to treat any soreness and discomfort from the procedure.  Food intake: Start with clear liquids (like water) and advance to regular food, as tolerated.   Physical activities: Keep activities to a minimum for the first 8 hours after the procedure.   Driving: If you have received any sedation, you are not allowed to drive for 24 hours after your procedure.  Blood thinner: Restart your blood thinner 6 hours after your procedure. (Only for those taking blood thinners)  Insulin: As soon as you can eat, you may resume your normal dosing schedule. (Only for those taking insulin)  Infection prevention: Keep procedure site clean and dry.  Post-procedure Pain Diary: Extremely important that this be done correctly and accurately. Recorded information will be used to determine the next step in treatment.  Pain evaluated is that of treated area only. Do not include pain from an untreated  area.  Complete every hour, on the hour, for the initial 8 hours. Set an alarm to help you do this part accurately.  Do not go to sleep and have it completed later. It will not be accurate.  Follow-up appointment: Keep your follow-up appointment after the procedure. Usually 2 weeks for most procedures. (6 weeks in the case of radiofrequency.) Bring you pain diary.  Expect:  From numbing medicine (AKA: Local Anesthetics): Numbness or decrease in pain.  Onset: Full effect within 15 minutes of injected.  Duration: It will depend on the type of local anesthetic used. On the average, 1 to 8 hours.   From steroids: Decrease in swelling or inflammation. Once inflammation is improved, relief of the pain will follow.  Onset of benefits: Depends on the amount of swelling present. The more swelling, the longer it will take for the benefits to be seen.   Duration: Steroids will stay in the system x 2 weeks. Duration of benefits will depend on multiple posibilities including persistent irritating factors.  From procedure: Some discomfort is to be expected once the numbing medicine wears off. This should be minimal if ice and heat are applied as instructed. Call if:  You experience numbness and weakness that gets worse with time, as opposed to wearing off.  New onset bowel or bladder incontinence. (Spinal procedures only)  Emergency Numbers:  Portal hours (Monday - Thursday, 8:00 AM - 4:00 PM) (Friday, 9:00 AM - 12:00 Noon): [phone]  After hours: [phone] ____________________________________________________________________________________________  Celiac Plexus Block Patient Information  Description: The celiac plexus is a group of nerves which are part of the sympathetic nervous system.  These nerves supply organs in the abdomen and pelvis.  Specific organs supplied with sensation by the celiac plexus include the stomach, liver, gallbladder, pancreas, kidneys and part  of the gut.   The celiac plexus is located on both sides of the aorta at approximately the level of the first lumbar vertebral body.  The block will be performed with you lying on your abdomen with a pillow underneath.  Using direct x-ray guidance, the celiac plexus will be located on both sides  of the spine.  Numbing medicine will be used to deaden the skin prior to needle insertion.  In most cases, a small amount of sedation can be given by IV prior to the numbing medicine.  Two small needles will be place near the celiac plexus and local anesthetic and steroid will be injected.  The entire block usually last about 15-25 minutes.  Conditions which may be treated by celiac plexus block:  Acute and chronic pancreatitis Pain from liver or pancreatic cancer Pain from Crohn's disease Other types of abdominal or flank pain  Preparation for the injection:  Do not eat any solid food or dairy products within 8 hours of your appointment. You may drink clear liquids up to 3 hours before appointment.  Clear liquids include water, black coffee, juice or soda.  No milk or cream please. You may take your regular medication, including pain medications, with a sip of water before your appointment.  Diabetics should hold regular insulin (if taken separately) and take 1/2 normal NPH dose in the morning of the procedure.  Carry some sugar containing items with you to your appointment. A driver must accompany you and be prepared to drive you home after your procedure. Bring all your current medications with you. An IV may be inserted and sedation may be given at the discretion of the physician. A blood pressure cuff, EKG, and other monitors will often be applied during the procedure.  Some patients may need to have extra oxygen administered for a short period. You will be asked to provide medical information, including your allergies and medications, prior to the procedure.  We must know immediately if you are  taking blood thinners (like Coumadin/Warfarin) or if you are allergic to IV iodine contrast (dye).  We must know if you could possible be pregnant.  Possible side-effects:  Bleeding from needle site or deeper Infection (rarre, can require surgery) Nerve injury (rare) Numbness & tingling (temporary) Collapsed lung (rare) Spinal headache ( a headache worse with upright posture) Light-headedness (temporary) Pain at injection site (several days) Decreased blood pressure (temporary) Weakness in legs (temporary) Seizure or other drug reaction (rare)  Call if you experience:  Fever/chills associated with headache or increased back/neck pain Headache worsened by an upright position New onset weakness or numbness of an extremity below the injection site. Hives or difficulty breathing (go to the emergency room) Inflammation or drainage at the injection site. New onset diarrhea lasting more than 2 weeks. New symptoms which are concerning to you  Please note:  If effective, we will often do a series of 2-3 injections spaced 3-6 weeks apart to maximally decrease your pain.  If initial series is effective, you may be a candidate for a more permanent block of the celiac plexus. .  If you have questions, please call [phone] [place]  What are the risk, side effects and possible complications? Generally speaking, most procedures are safe.  However, with any procedure there are risks, side effects, and the possibility of complications.  The risks and complications are dependent upon the sites that are lesioned, or the type of nerve block to be performed.  The closer the procedure is to the spine, the more serious the risks are.  Great care is taken when placing the radio frequency needles, block needles or lesioning probes, but sometimes complications can occur. Infection: Any time there is an injection through the  skin, there is a  risk of infection.  This is why sterile conditions are used for these blocks.  There are four possible types of infection. Localized skin infection. Central Nervous System Infection-This can be in the form of Meningitis, which can be deadly. Epidural Infections-This can be in the form of an epidural abscess, which can cause pressure inside of the spine, causing compression of the spinal cord with subsequent paralysis. This would require an emergency surgery to decompress, and there are no guarantees that the patient would recover from the paralysis. Discitis-This is an infection of the intervertebral discs.  It occurs in about 1% of discography procedures.  It is difficult to treat and it may lead to surgery.        2. Pain: the needles have to go through skin and soft tissues, will cause soreness.       3. Damage to internal structures:  The nerves to be lesioned may be near blood vessels or    other nerves which can be potentially damaged.       4. Bleeding: Bleeding is more common if the patient is taking blood thinners such as  aspirin, Coumadin, Ticiid, Plavix, etc., or if he/she have some genetic predisposition  such as hemophilia. Bleeding into the spinal canal can cause compression of the spinal  cord with subsequent paralysis.  This would require an emergency surgery to  decompress and there are no guarantees that the patient would recover from the  paralysis.       5. Pneumothorax:  Puncturing of a lung is a possibility, every time a needle is introduced in  the area of the chest or upper back.  Pneumothorax refers to free air around the  collapsed lung(s), inside of the thoracic cavity (chest cavity).  Another two possible  complications related to a similar event would include: Hemothorax and Chylothorax.   These are variations of the Pneumothorax, where instead of air around the collapsed  lung(s), you may have blood or chyle, respectively.       6. Spinal headaches: They may  occur with any procedures in the area of the spine.       7. Persistent CSF (Cerebro-Spinal Fluid) leakage: This is a rare problem, but may occur  with prolonged intrathecal or epidural catheters either due to the formation of a fistulous  track or a dural tear.       8. Nerve damage: By working so close to the spinal cord, there is always a possibility of  nerve damage, which could be as serious as a permanent spinal cord injury with  paralysis.       9. Death:  Although rare, severe deadly allergic reactions known as "Anaphylactic  reaction" can occur to any of the medications used.      10. Worsening of the symptoms:  We can always make thing worse.  What are the chances of something like this happening? Chances of any of this occuring are extremely low.  By statistics, you have more of a chance of getting killed in a motor vehicle accident: while driving to the hospital than any of the above occurring .  Nevertheless, you should be aware that they are possibilities.  In general, it is similar to taking a shower.  Everybody knows that you can slip, hit your head and get killed.  Does that mean that you should not shower again?  Nevertheless always keep in mind that statistics do not mean anything if you happen to be on the wrong  side of them.  Even if a procedure has a 1 (one) in a 1,000,000 (million) chance of going wrong, it you happen to be that one..Also, keep in mind that by statistics, you have more of a chance of having something go wrong when taking medications.  Who should not have this procedure? If you are on a blood thinning medication (e.g. Coumadin, Plavix, see list of "Blood Thinners"), or if you have an active infection going on, you should not have the procedure.  If you are taking any blood thinners, please inform your physician.  How should I prepare for this procedure? Do not eat or drink anything at least six hours prior to the procedure. Bring a driver with you .  It cannot be  a taxi. Come accompanied by an adult that can drive you back, and that is strong enough to help you if your legs get weak or numb from the local anesthetic. Take all of your medicines the morning of the procedure with just enough water to swallow them. If you have diabetes, make sure that you are scheduled to have your procedure done first thing in the morning, whenever possible. If you have diabetes, take only half of your insulin dose and notify our nurse that you have done so as soon as you arrive at the clinic. If you are diabetic, but only take blood sugar pills (oral hypoglycemic), then do not take them on the morning of your procedure.  You may take them after you have had the procedure. Do not take aspirin or any aspirin-containing medications, at least eleven (11) days prior to the procedure.  They may prolong bleeding. Wear loose fitting clothing that may be easy to take off and that you would not mind if it got stained with Betadine or blood. Do not wear any jewelry or perfume Remove any nail coloring.  It will interfere with some of our monitoring equipment.  NOTE: Remember that this is not meant to be interpreted as a complete list of all possible complications.  Unforeseen problems may occur.  BLOOD THINNERS The following drugs contain aspirin or other products, which can cause increased bleeding during surgery and should not be taken for 2 weeks prior to and 1 week after surgery.  If you should need take something for relief of minor pain, you may take acetaminophen which is found in Tylenol,m Datril, Anacin-3 and Panadol. It is not blood thinner. The products listed below are.  Do not take any of the products listed below in addition to any listed on your instruction sheet.  A.P.C or A.P.C with Codeine Codeine Phosphate Capsules #3 Ibuprofen Ridaura  ABC compound Congesprin Imuran rimadil  Advil Cope Indocin Robaxisal  Alka-Seltzer Effervescent Pain Reliever and Antacid Coricidin  or Coricidin-D  Indomethacin Rufen  Alka-Seltzer plus Cold Medicine Cosprin Ketoprofen S-A-C Tablets  Anacin Analgesic Tablets or Capsules Coumadin Korlgesic Salflex  Anacin Extra Strength Analgesic tablets or capsules CP-2 Tablets Lanoril Salicylate  Anaprox Cuprimine Capsules Levenox Salocol  Anexsia-D Dalteparin Magan Salsalate  Anodynos Darvon compound Magnesium Salicylate Sine-off  Ansaid Dasin Capsules Magsal Sodium Salicylate  Anturane Depen Capsules Marnal Soma  APF Arthritis pain formula Dewitt's Pills Measurin Stanback  Argesic Dia-Gesic Meclofenamic Sulfinpyrazone  Arthritis Bayer Timed Release Aspirin Diclofenac Meclomen Sulindac  Arthritis pain formula Anacin Dicumarol Medipren Supac  Analgesic (Safety coated) Arthralgen Diffunasal Mefanamic Suprofen  Arthritis Strength Bufferin Dihydrocodeine Mepro Compound Suprol  Arthropan liquid Dopirydamole Methcarbomol with Aspirin Synalgos  ASA tablets/Enseals Disalcid Micrainin Tagament  Ascriptin Doan's Midol Talwin  Ascriptin A/D Dolene Mobidin Tanderil  Ascriptin Extra Strength Dolobid Moblgesic Ticlid  Ascriptin with Codeine Doloprin or Doloprin with Codeine Momentum Tolectin  Asperbuf Duoprin Mono-gesic Trendar  Aspergum Duradyne Motrin or Motrin IB Triminicin  Aspirin plain, buffered or enteric coated Durasal Myochrisine Trigesic  Aspirin Suppositories Easprin Nalfon Trillsate  Aspirin with Codeine Ecotrin Regular or Extra Strength Naprosyn Uracel  Atromid-S Efficin Naproxen Ursinus  Auranofin Capsules Elmiron Neocylate Vanquish  Axotal Emagrin Norgesic Verin  Azathioprine Empirin or Empirin with Codeine Normiflo Vitamin E  Azolid Emprazil Nuprin Voltaren  Bayer Aspirin plain, buffered or children's or timed BC Tablets or powders Encaprin Orgaran Warfarin Sodium  Buff-a-Comp Enoxaparin Orudis Zorpin  Buff-a-Comp with Codeine Equegesic Os-Cal-Gesic   Buffaprin Excedrin plain, buffered or Extra Strength Oxalid   Bufferin  Arthritis Strength Feldene Oxphenbutazone   Bufferin plain or Extra Strength Feldene Capsules Oxycodone with Aspirin   Bufferin with Codeine Fenoprofen Fenoprofen Pabalate or Pabalate-SF   Buffets II Flogesic Panagesic   Buffinol plain or Extra Strength Florinal or Florinal with Codeine Panwarfarin   Buf-Tabs Flurbiprofen Penicillamine   Butalbital Compound Four-way cold tablets Penicillin   Butazolidin Fragmin Pepto-Bismol   Carbenicillin Geminisyn Percodan   Carna Arthritis Reliever Geopen Persantine   Carprofen Gold's salt Persistin   Chloramphenicol Goody's Phenylbutazone   Chloromycetin Haltrain Piroxlcam   Clmetidine heparin Plaquenil   Cllnoril Hyco-pap Ponstel   Clofibrate Hydroxy chloroquine Propoxyphen         Before stopping any of these medications, be sure to consult the physician who ordered them.  Some, such as Coumadin (Warfarin) are ordered to prevent or treat serious conditions such as "deep thrombosis", "pumonary embolisms", and other heart problems.  The amount of time that you may need off of the medication may also vary with the medication and the reason for which you were taking it.  If you are taking any of these medications, please make sure you notify your pain physician before you undergo any procedures.         Facet Blocks Patient Information  Description: The facets are joints in the spine between the vertebrae.  Like any joints in the body, facets can become irritated and painful.  Arthritis can also effect the facets.  By injecting steroids and local anesthetic in and around these joints, we can temporarily block the nerve supply to them.  Steroids act directly on irritated nerves and tissues to reduce selling and inflammation which often leads to decreased pain.  Facet blocks may be done anywhere along the spine from the neck to the low back depending upon the location of your pain.   After numbing the skin with local anesthetic (like Novocaine), a  small needle is passed onto the facet joints under x-ray guidance.  You may experience a sensation of pressure while this is being done.  The entire block usually lasts about 15-25 minutes.   Conditions which may be treated by facet blocks:  Low back/buttock pain Neck/shoulder pain Certain types of headaches  Preparation for the injection:  Do not eat any solid food or dairy products within 8 hours of your appointment. You may drink clear liquid up to 3 hours before appointment.  Clear liquids include water, black coffee, juice or soda.  No milk or cream please. You may take your regular medication, including pain medications, with a sip of water before your appointment.  Diabetics should hold regular insulin (if taken separately) and take 1/2 normal NPH dose  the morning of the procedure.  Carry some sugar containing items with you to your appointment. A driver must accompany you and be prepared to drive you home after your procedure. Bring all your current medications with you. An IV may be inserted and sedation may be given at the discretion of the physician. A blood pressure cuff, EKG and other monitors will often be applied during the procedure.  Some patients may need to have extra oxygen administered for a short period. You will be asked to provide medical information, including your allergies and medications, prior to the procedure.  We must know immediately if you are taking blood thinners (like Coumadin/Warfarin) or if you are allergic to IV iodine contrast (dye).  We must know if you could possible be pregnant.  Possible side-effects:  Bleeding from needle site Infection (rare, may require surgery) Nerve injury (rare) Numbness & tingling (temporary) Difficulty urinating (rare, temporary) Spinal headache (a headache worse with upright posture) Light-headedness (temporary) Pain at injection site (serveral days) Decreased blood pressure (rare, temporary) Weakness in arm/leg  (temporary) Pressure sensation in back/neck (temporary)   Call if you experience:  Fever/chills associated with headache or increased back/neck pain Headache worsened by an upright position New onset, weakness or numbness of an extremity below the injection site Hives or difficulty breathing (go to the emergency room) Inflammation or drainage at the injection site(s) Severe back/neck pain greater than usual New symptoms which are concerning to you  Please note:  Although the local anesthetic injected can often make your back or neck feel good for several hours after the injection, the pain will likely return. It takes 3-7 days for steroids to work.  You may not notice any pain relief for at least one week.  If effective, we will often do a series of 2-3 injections spaced 3-6 weeks apart to maximally decrease your pain.  After the initial series, you may be a candidate for a more permanent nerve block of the facets.  If you have any questions, please call #[phone]) [facility]

## 2017-04-15 NOTE — Patient Instructions (Addendum)
____________________________________________________________________________________________  Post-Procedure instructions Instructions:  Apply ice: Fill a plastic sandwich bag with crushed ice. Cover it with a small towel and apply to injection site. Apply for 15 minutes then remove x 15 minutes. Repeat sequence on day of procedure, until you go to bed. The purpose is to minimize swelling and discomfort after procedure.  Apply heat: Apply heat to procedure site starting the day following the procedure. The purpose is to treat any soreness and discomfort from the procedure.  Food intake: Start with clear liquids (like water) and advance to regular food, as tolerated.   Physical activities: Keep activities to a minimum for the first 8 hours after the procedure.   Driving: If you have received any sedation, you are not allowed to drive for 24 hours after your procedure.  Blood thinner: Restart your blood thinner 6 hours after your procedure. (Only for those taking blood thinners)  Insulin: As soon as you can eat, you may resume your normal dosing schedule. (Only for those taking insulin)  Infection prevention: Keep procedure site clean and dry.  Post-procedure Pain Diary: Extremely important that this be done correctly and accurately. Recorded information will be used to determine the next step in treatment.  Pain evaluated is that of treated area only. Do not include pain from an untreated area.  Complete every hour, on the hour, for the initial 8 hours. Set an alarm to help you do this part accurately.  Do not go to sleep and have it completed later. It will not be accurate.  Follow-up appointment: Keep your follow-up appointment after the procedure. Usually 2 weeks for most procedures. (6 weeks in the case of radiofrequency.) Bring you pain diary.  Expect:  From numbing medicine (AKA: Local Anesthetics): Numbness or decrease in pain.  Onset: Full effect within 15 minutes of  injected.  Duration: It will depend on the type of local anesthetic used. On the average, 1 to 8 hours.   From steroids: Decrease in swelling or inflammation. Once inflammation is improved, relief of the pain will follow.  Onset of benefits: Depends on the amount of swelling present. The more swelling, the longer it will take for the benefits to be seen.   Duration: Steroids will stay in the system x 2 weeks. Duration of benefits will depend on multiple posibilities including persistent irritating factors.  From procedure: Some discomfort is to be expected once the numbing medicine wears off. This should be minimal if ice and heat are applied as instructed. Call if:  You experience numbness and weakness that gets worse with time, as opposed to wearing off.  New onset bowel or bladder incontinence. (Spinal procedures only)  Emergency Numbers:  [place] hours (Monday - Thursday, 8:00 AM - 4:00 PM) (Friday, 9:00 AM - 12:00 Noon): [phone]  After hours: [phone] ____________________________________________________________________________________________  Celiac Plexus Block Patient Information  Description: The celiac plexus is a group of nerves which are part of the sympathetic nervous system.  These nerves supply organs in the abdomen and pelvis.  Specific organs supplied with sensation by the celiac plexus include the stomach, liver, gallbladder, pancreas, kidneys and part of the gut.   The celiac plexus is located on both sides of the aorta at approximately the level of the first lumbar vertebral body.  The block will be performed with you lying on your abdomen with a pillow underneath.  Using direct x-ray guidance, the celiac plexus will be located on both sides of the spine.  Numbing  medicine will be used to deaden the skin prior to needle insertion.  In most cases, a small amount of sedation can be given by IV prior to the numbing medicine.  Two small needles will  be place near the celiac plexus and local anesthetic and steroid will be injected.  The entire block usually last about 15-25 minutes.  Conditions which may be treated by celiac plexus block:  Acute and chronic pancreatitis Pain from liver or pancreatic cancer Pain from Crohn's disease Other types of abdominal or flank pain  Preparation for the injection:  Do not eat any solid food or dairy products within 8 hours of your appointment. You may drink clear liquids up to 3 hours before appointment.  Clear liquids include water, black coffee, juice or soda.  No milk or cream please. You may take your regular medication, including pain medications, with a sip of water before your appointment.  Diabetics should hold regular insulin (if taken separately) and take 1/2 normal NPH dose in the morning of the procedure.  Carry some sugar containing items with you to your appointment. A driver must accompany you and be prepared to drive you home after your procedure. Bring all your current medications with you. An IV may be inserted and sedation may be given at the discretion of the physician. A blood pressure cuff, EKG, and other monitors will often be applied during the procedure.  Some patients may need to have extra oxygen administered for a short period. You will be asked to provide medical information, including your allergies and medications, prior to the procedure.  We must know immediately if you are taking blood thinners (like Coumadin/Warfarin) or if you are allergic to IV iodine contrast (dye).  We must know if you could possible be pregnant.  Possible side-effects:  Bleeding from needle site or deeper Infection (rarre, can require surgery) Nerve injury (rare) Numbness & tingling (temporary) Collapsed lung (rare) Spinal headache ( a headache worse with upright posture) Light-headedness (temporary) Pain at injection site (several days) Decreased blood pressure (temporary) Weakness in  legs (temporary) Seizure or other drug reaction (rare)  Call if you experience:  Fever/chills associated with headache or increased back/neck pain Headache worsened by an upright position New onset weakness or numbness of an extremity below the injection site. Hives or difficulty breathing (go to the emergency room) Inflammation or drainage at the injection site. New onset diarrhea lasting more than 2 weeks. New symptoms which are concerning to you  Please note:  If effective, we will often do a series of 2-3 injections spaced 3-6 weeks apart to maximally decrease your pain.  If initial series is effective, you may be a candidate for a more permanent block of the celiac plexus. .  If you have questions, please call [phone] [place]  What are the risk, side effects and possible complications? Generally speaking, most procedures are safe.  However, with any procedure there are risks, side effects, and the possibility of complications.  The risks and complications are dependent upon the sites that are lesioned, or the type of nerve block to be performed.  The closer the procedure is to the spine, the more serious the risks are.  Great care is taken when placing the radio frequency needles, block needles or lesioning probes, but sometimes complications can occur. Infection: Any time there is an injection through the skin, there is a risk of infection.  This  is why sterile conditions are used for these blocks.  There are four possible types of infection. Localized skin infection. Central Nervous System Infection-This can be in the form of Meningitis, which can be deadly. Epidural Infections-This can be in the form of an epidural abscess, which can cause pressure inside of the spine, causing compression of the spinal cord with subsequent paralysis. This would require an emergency surgery to decompress, and there are  no guarantees that the patient would recover from the paralysis. Discitis-This is an infection of the intervertebral discs.  It occurs in about 1% of discography procedures.  It is difficult to treat and it may lead to surgery.        2. Pain: the needles have to go through skin and soft tissues, will cause soreness.       3. Damage to internal structures:  The nerves to be lesioned may be near blood vessels or    other nerves which can be potentially damaged.       4. Bleeding: Bleeding is more common if the patient is taking blood thinners such as  aspirin, Coumadin, Ticiid, Plavix, etc., or if he/she have some genetic predisposition  such as hemophilia. Bleeding into the spinal canal can cause compression of the spinal  cord with subsequent paralysis.  This would require an emergency surgery to  decompress and there are no guarantees that the patient would recover from the  paralysis.       5. Pneumothorax:  Puncturing of a lung is a possibility, every time a needle is introduced in  the area of the chest or upper back.  Pneumothorax refers to free air around the  collapsed lung(s), inside of the thoracic cavity (chest cavity).  Another two possible  complications related to a similar event would include: Hemothorax and Chylothorax.   These are variations of the Pneumothorax, where instead of air around the collapsed  lung(s), you may have blood or chyle, respectively.       6. Spinal headaches: They may occur with any procedures in the area of the spine.       7. Persistent CSF (Cerebro-Spinal Fluid) leakage: This is a rare problem, but may occur  with prolonged intrathecal or epidural catheters either due to the formation of a fistulous  track or a dural tear.       8. Nerve damage: By working so close to the spinal cord, there is always a possibility of  nerve damage, which could be as serious as a permanent spinal cord injury with  paralysis.       9. Death:  Although rare, severe deadly allergic  reactions known as "Anaphylactic  reaction" can occur to any of the medications used.      10. Worsening of the symptoms:  We can always make thing worse.  What are the chances of something like this happening? Chances of any of this occuring are extremely low.  By statistics, you have more of a chance of getting killed in a motor vehicle accident: while driving to the hospital than any of the above occurring .  Nevertheless, you should be aware that they are possibilities.  In general, it is similar to taking a shower.  Everybody knows that you can slip, hit your head and get killed.  Does that mean that you should not shower again?  Nevertheless always keep in mind that statistics do not mean anything if you happen to be on the wrong side of them.  Even if a procedure has a 1 (one) in a 1,000,000 (million) chance of going wrong, it you happen to be that one..Also, keep in mind that by statistics, you have more of a chance of having something go wrong when taking medications.  Who should not have this procedure? If you are on a blood thinning medication (e.g. Coumadin, Plavix, see list of "Blood Thinners"), or if you have an active infection going on, you should not have the procedure.  If you are taking any blood thinners, please inform your physician.  How should I prepare for this procedure? Do not eat or drink anything at least six hours prior to the procedure. Bring a driver with you .  It cannot be a taxi. Come accompanied by an adult that can drive you back, and that is strong enough to help you if your legs get weak or numb from the local anesthetic. Take all of your medicines the morning of the procedure with just enough water to swallow them. If you have diabetes, make sure that you are scheduled to have your procedure done first thing in the morning, whenever possible. If you have diabetes, take only half of your insulin dose and notify our nurse that you have done so as soon as you arrive  at the clinic. If you are diabetic, but only take blood sugar pills (oral hypoglycemic), then do not take them on the morning of your procedure.  You may take them after you have had the procedure. Do not take aspirin or any aspirin-containing medications, at least eleven (11) days prior to the procedure.  They may prolong bleeding. Wear loose fitting clothing that may be easy to take off and that you would not mind if it got stained with Betadine or blood. Do not wear any jewelry or perfume Remove any nail coloring.  It will interfere with some of our monitoring equipment.  NOTE: Remember that this is not meant to be interpreted as a complete list of all possible complications.  Unforeseen problems may occur.  BLOOD THINNERS The following drugs contain aspirin or other products, which can cause increased bleeding during surgery and should not be taken for 2 weeks prior to and 1 week after surgery.  If you should need take something for relief of minor pain, you may take acetaminophen which is found in Tylenol,m Datril, Anacin-3 and Panadol. It is not blood thinner. The products listed below are.  Do not take any of the products listed below in addition to any listed on your instruction sheet.  A.P.C or A.P.C with Codeine Codeine Phosphate Capsules #3 Ibuprofen Ridaura  ABC compound Congesprin Imuran rimadil  Advil Cope Indocin Robaxisal  Alka-Seltzer Effervescent Pain Reliever and Antacid Coricidin or Coricidin-D  Indomethacin Rufen  Alka-Seltzer plus Cold Medicine Cosprin Ketoprofen S-A-C Tablets  Anacin Analgesic Tablets or Capsules Coumadin Korlgesic Salflex  Anacin Extra Strength Analgesic tablets or capsules CP-2 Tablets Lanoril Salicylate  Anaprox Cuprimine Capsules Levenox Salocol  Anexsia-D Dalteparin Magan Salsalate  Anodynos Darvon compound Magnesium Salicylate Sine-off  Ansaid Dasin Capsules Magsal Sodium Salicylate  Anturane Depen Capsules Marnal Soma  APF Arthritis pain formula  Dewitt's Pills Measurin Stanback  Argesic Dia-Gesic Meclofenamic Sulfinpyrazone  Arthritis Bayer Timed Release Aspirin Diclofenac Meclomen Sulindac  Arthritis pain formula Anacin Dicumarol Medipren Supac  Analgesic (Safety coated) Arthralgen Diffunasal Mefanamic Suprofen  Arthritis Strength Bufferin Dihydrocodeine Mepro Compound Suprol  Arthropan liquid Dopirydamole Methcarbomol with Aspirin Synalgos  ASA tablets/Enseals Disalcid Micrainin Tagament  Ascriptin Doan's Midol  Talwin  Ascriptin A/D Dolene Mobidin Tanderil  Ascriptin Extra Strength Dolobid Moblgesic Ticlid  Ascriptin with Codeine Doloprin or Doloprin with Codeine Momentum Tolectin  Asperbuf Duoprin Mono-gesic Trendar  Aspergum Duradyne Motrin or Motrin IB Triminicin  Aspirin plain, buffered or enteric coated Durasal Myochrisine Trigesic  Aspirin Suppositories Easprin Nalfon Trillsate  Aspirin with Codeine Ecotrin Regular or Extra Strength Naprosyn Uracel  Atromid-S Efficin Naproxen Ursinus  Auranofin Capsules Elmiron Neocylate Vanquish  Axotal Emagrin Norgesic Verin  Azathioprine Empirin or Empirin with Codeine Normiflo Vitamin E  Azolid Emprazil Nuprin Voltaren  Bayer Aspirin plain, buffered or children's or timed BC Tablets or powders Encaprin Orgaran Warfarin Sodium  Buff-a-Comp Enoxaparin Orudis Zorpin  Buff-a-Comp with Codeine Equegesic Os-Cal-Gesic   Buffaprin Excedrin plain, buffered or Extra Strength Oxalid   Bufferin Arthritis Strength Feldene Oxphenbutazone   Bufferin plain or Extra Strength Feldene Capsules Oxycodone with Aspirin   Bufferin with Codeine Fenoprofen Fenoprofen Pabalate or Pabalate-SF   Buffets II Flogesic Panagesic   Buffinol plain or Extra Strength Florinal or Florinal with Codeine Panwarfarin   Buf-Tabs Flurbiprofen Penicillamine   Butalbital Compound Four-way cold tablets Penicillin   Butazolidin Fragmin Pepto-Bismol   Carbenicillin Geminisyn Percodan   Carna Arthritis Reliever Geopen  Persantine   Carprofen Gold's salt Persistin   Chloramphenicol Goody's Phenylbutazone   Chloromycetin Haltrain Piroxlcam   Clmetidine heparin Plaquenil   Cllnoril Hyco-pap Ponstel   Clofibrate Hydroxy chloroquine Propoxyphen         Before stopping any of these medications, be sure to consult the physician who ordered them.  Some, such as Coumadin (Warfarin) are ordered to prevent or treat serious conditions such as "deep thrombosis", "pumonary embolisms", and other heart problems.  The amount of time that you may need off of the medication may also vary with the medication and the reason for which you were taking it.  If you are taking any of these medications, please make sure you notify your pain physician before you undergo any procedures.         Facet Blocks Patient Information  Description: The facets are joints in the spine between the vertebrae.  Like any joints in the body, facets can become irritated and painful.  Arthritis can also effect the facets.  By injecting steroids and local anesthetic in and around these joints, we can temporarily block the nerve supply to them.  Steroids act directly on irritated nerves and tissues to reduce selling and inflammation which often leads to decreased pain.  Facet blocks may be done anywhere along the spine from the neck to the low back depending upon the location of your pain.   After numbing the skin with local anesthetic (like Novocaine), a small needle is passed onto the facet joints under x-ray guidance.  You may experience a sensation of pressure while this is being done.  The entire block usually lasts about 15-25 minutes.   Conditions which may be treated by facet blocks:  Low back/buttock pain Neck/shoulder pain Certain types of headaches  Preparation for the injection:  Do not eat any solid food or dairy products within 8 hours of your appointment. You may drink clear liquid up to 3 hours before appointment.  Clear liquids  include water, black coffee, juice or soda.  No milk or cream please. You may take your regular medication, including pain medications, with a sip of water before your appointment.  Diabetics should hold regular insulin (if taken separately) and take 1/2 normal NPH dose the morning of  the procedure.  Carry some sugar containing items with you to your appointment. A driver must accompany you and be prepared to drive you home after your procedure. Bring all your current medications with you. An IV may be inserted and sedation may be given at the discretion of the physician. A blood pressure cuff, EKG and other monitors will often be applied during the procedure.  Some patients may need to have extra oxygen administered for a short period. You will be asked to provide medical information, including your allergies and medications, prior to the procedure.  We must know immediately if you are taking blood thinners (like Coumadin/Warfarin) or if you are allergic to IV iodine contrast (dye).  We must know if you could possible be pregnant.  Possible side-effects:  Bleeding from needle site Infection (rare, may require surgery) Nerve injury (rare) Numbness & tingling (temporary) Difficulty urinating (rare, temporary) Spinal headache (a headache worse with upright posture) Light-headedness (temporary) Pain at injection site (serveral days) Decreased blood pressure (rare, temporary) Weakness in arm/leg (temporary) Pressure sensation in back/neck (temporary)   Call if you experience:  Fever/chills associated with headache or increased back/neck pain Headache worsened by an upright position New onset, weakness or numbness of an extremity below the injection site Hives or difficulty breathing (go to the emergency room) Inflammation or drainage at the injection site(s) Severe back/neck pain greater than usual New symptoms which are concerning to you  Please note:  Although the local anesthetic  injected can often make your back or neck feel good for several hours after the injection, the pain will likely return. It takes 3-7 days for steroids to work.  You may not notice any pain relief for at least one week.  If effective, we will often do a series of 2-3 injections spaced 3-6 weeks apart to maximally decrease your pain.  After the initial series, you may be a candidate for a more permanent nerve block of the facets.  If you have any questions, please call #[phone] [facility]

## 2017-04-16 ENCOUNTER — Telehealth: Payer: Self-pay | Admitting: *Deleted

## 2017-04-16 NOTE — Telephone Encounter (Signed)
Voice mail left with patient to call our office if there are any questions or concerns re; procedure on yesterday.  

## 2017-04-18 DIAGNOSIS — M25562 Pain in left knee: Secondary | ICD-10-CM | POA: Diagnosis not present

## 2017-04-18 DIAGNOSIS — G8929 Other chronic pain: Secondary | ICD-10-CM | POA: Diagnosis not present

## 2017-04-30 DIAGNOSIS — I471 Supraventricular tachycardia: Secondary | ICD-10-CM | POA: Diagnosis not present

## 2017-04-30 DIAGNOSIS — I480 Paroxysmal atrial fibrillation: Secondary | ICD-10-CM | POA: Diagnosis not present

## 2017-04-30 DIAGNOSIS — I10 Essential (primary) hypertension: Secondary | ICD-10-CM | POA: Diagnosis not present

## 2017-04-30 DIAGNOSIS — R079 Chest pain, unspecified: Secondary | ICD-10-CM | POA: Diagnosis not present

## 2017-04-30 DIAGNOSIS — I491 Atrial premature depolarization: Secondary | ICD-10-CM | POA: Diagnosis not present

## 2017-04-30 DIAGNOSIS — R002 Palpitations: Secondary | ICD-10-CM | POA: Diagnosis not present

## 2017-05-02 ENCOUNTER — Encounter: Payer: Self-pay | Admitting: Pain Medicine

## 2017-05-02 ENCOUNTER — Ambulatory Visit: Payer: PPO | Attending: Pain Medicine | Admitting: Pain Medicine

## 2017-05-02 VITALS — BP 122/78 | HR 73 | Temp 98.2°F | Resp 18 | Ht 72.0 in | Wt 248.0 lb

## 2017-05-02 DIAGNOSIS — I491 Atrial premature depolarization: Secondary | ICD-10-CM | POA: Insufficient documentation

## 2017-05-02 DIAGNOSIS — I471 Supraventricular tachycardia: Secondary | ICD-10-CM | POA: Diagnosis not present

## 2017-05-02 DIAGNOSIS — M5412 Radiculopathy, cervical region: Secondary | ICD-10-CM | POA: Insufficient documentation

## 2017-05-02 DIAGNOSIS — M791 Myalgia: Secondary | ICD-10-CM | POA: Insufficient documentation

## 2017-05-02 DIAGNOSIS — M79604 Pain in right leg: Secondary | ICD-10-CM | POA: Diagnosis not present

## 2017-05-02 DIAGNOSIS — E119 Type 2 diabetes mellitus without complications: Secondary | ICD-10-CM | POA: Diagnosis not present

## 2017-05-02 DIAGNOSIS — G894 Chronic pain syndrome: Secondary | ICD-10-CM | POA: Diagnosis not present

## 2017-05-02 DIAGNOSIS — R079 Chest pain, unspecified: Secondary | ICD-10-CM | POA: Insufficient documentation

## 2017-05-02 DIAGNOSIS — E039 Hypothyroidism, unspecified: Secondary | ICD-10-CM | POA: Insufficient documentation

## 2017-05-02 DIAGNOSIS — G8929 Other chronic pain: Secondary | ICD-10-CM | POA: Diagnosis not present

## 2017-05-02 DIAGNOSIS — R200 Anesthesia of skin: Secondary | ICD-10-CM | POA: Insufficient documentation

## 2017-05-02 DIAGNOSIS — M329 Systemic lupus erythematosus, unspecified: Secondary | ICD-10-CM | POA: Insufficient documentation

## 2017-05-02 DIAGNOSIS — E669 Obesity, unspecified: Secondary | ICD-10-CM | POA: Insufficient documentation

## 2017-05-02 DIAGNOSIS — M79605 Pain in left leg: Secondary | ICD-10-CM

## 2017-05-02 DIAGNOSIS — M545 Low back pain, unspecified: Secondary | ICD-10-CM

## 2017-05-02 DIAGNOSIS — L4050 Arthropathic psoriasis, unspecified: Secondary | ICD-10-CM | POA: Diagnosis not present

## 2017-05-02 DIAGNOSIS — M533 Sacrococcygeal disorders, not elsewhere classified: Secondary | ICD-10-CM | POA: Diagnosis not present

## 2017-05-02 DIAGNOSIS — G5640 Causalgia of unspecified upper limb: Secondary | ICD-10-CM | POA: Insufficient documentation

## 2017-05-02 DIAGNOSIS — M542 Cervicalgia: Secondary | ICD-10-CM | POA: Insufficient documentation

## 2017-05-02 DIAGNOSIS — D649 Anemia, unspecified: Secondary | ICD-10-CM | POA: Diagnosis not present

## 2017-05-02 DIAGNOSIS — M25562 Pain in left knee: Secondary | ICD-10-CM | POA: Insufficient documentation

## 2017-05-02 DIAGNOSIS — J45909 Unspecified asthma, uncomplicated: Secondary | ICD-10-CM | POA: Diagnosis not present

## 2017-05-02 DIAGNOSIS — M4696 Unspecified inflammatory spondylopathy, lumbar region: Secondary | ICD-10-CM | POA: Diagnosis not present

## 2017-05-02 DIAGNOSIS — Z79891 Long term (current) use of opiate analgesic: Secondary | ICD-10-CM | POA: Diagnosis not present

## 2017-05-02 DIAGNOSIS — M479 Spondylosis, unspecified: Secondary | ICD-10-CM | POA: Diagnosis not present

## 2017-05-02 DIAGNOSIS — M47816 Spondylosis without myelopathy or radiculopathy, lumbar region: Secondary | ICD-10-CM

## 2017-05-02 DIAGNOSIS — H269 Unspecified cataract: Secondary | ICD-10-CM | POA: Insufficient documentation

## 2017-05-02 DIAGNOSIS — M5416 Radiculopathy, lumbar region: Secondary | ICD-10-CM

## 2017-05-02 DIAGNOSIS — I10 Essential (primary) hypertension: Secondary | ICD-10-CM | POA: Insufficient documentation

## 2017-05-02 DIAGNOSIS — K219 Gastro-esophageal reflux disease without esophagitis: Secondary | ICD-10-CM | POA: Insufficient documentation

## 2017-05-02 NOTE — Progress Notes (Signed)
Safety precautions to be maintained throughout the outpatient stay will include: orient to surroundings, keep bed in low position, maintain call bell within reach at all times, provide assistance with transfer out of bed and ambulation.  

## 2017-05-02 NOTE — Progress Notes (Signed)
Patient's Name: James [name]  MRN: [id number]  Referring Provider: [name], MD  DOB: 09-Mar-1956  PCP: [name], MD  DOS: 05/02/2017  Note by: Kathlen Brunswick. Dossie Arbour, MD  Service setting: Ambulatory outpatient  Specialty: Interventional Pain Management  Location: [facility] (AMB) Pain Management Facility    Patient type: Established   Primary Reason(s) for Visit: Encounter for post-procedure evaluation of chronic illness with mild to moderate exacerbation CC: Back Pain (low) and Back Pain (cervical)  HPI  James [name] is a 61 y.o. year old, male patient, who comes today for a post-procedure evaluation. He has Airway hyperreactivity; Atrial fibrillation ([place]); Narrowing of intervertebral disc space; Abnormal echocardiogram; Gastric ulcer; Gastric catarrh; Acid reflux; Awareness of heartbeats; Hemorrhoid; Hepatitis; BP (high blood pressure); Lactose intolerance; Osteopenia; APC (atrial premature contractions); Apnea, sleep; Supraventricular tachycardia ([place]); Long term current use of opiate analgesic; Long term prescription opiate use; Opiate use (120 MME/Day); Opiate dependence ([place]); Encounter for therapeutic drug level monitoring; Chronic low back pain (Location of Primary Source of Pain) (Bilateral) (R>L); Lumbar spondylosis; Chronic neck pain (Right); Cervical spondylosis (C7 intravertebral body cyst); Chronic cervical radicular pain (Right); Chronic lumbar radicular pain (Location of Secondary source of pain) (Bilateral) (L>R) (L5 Dermatome); Osteoarthritis; Chronic hip pain; Chronic knee pain (Bilateral) (R>L); Complex regional pain syndrome type II of upper extremity (Right); Chronic upper extremity pain (Right); 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 Raquel Sarna); Epidural fibrosis; Musculoskeletal pain; Neurogenic pain; Neuropathic pain; Chronic sacroiliac joint pain (Bilateral) (R>L); Psoriatic arthritis ([place]); Controlled type 2 diabetes mellitus without complication ([place]); Chest pain; Acquired hypothyroidism; Chronic lower extremity pain (Location of Secondary source of pain) (Bilateral) (L>R); Psoriasis with arthropathy ([place]); Hypothyroidism; Numbness and tingling of both legs; Abnormal nerve conduction studies (06/20/16); Chronic pain syndrome; Hypotestosteronism; and Nausea without vomiting on his problem list. His primarily concern today is the Back Pain (low) and Back Pain (cervical)  Pain Assessment: Location: Lower, Upper Back Radiating: right leg is the worst, cramping and throbbing sensation Onset: More than a month ago Quality: Aching, Sharp, Stabbing, Throbbing, Spasm, Cramping Severity: 3 /10 (self-reported pain score)  Note: Reported level is compatible with observation.                   Timing: Constant  James [name] comes in today for post-procedure evaluation after the treatment done on 04/15/2017. We'll repeat his lumbar facet RFA, which he had done more than 2 years ago.  Further details on both, my assessment(s), as well as the proposed treatment plan, please see below.  Post-Procedure Assessment  04/15/2017 Procedure: Diagnostic bilateral lumbar facet block under fluoroscopic guidance and IV sedation Pre-procedure pain score:  4/10 Post-procedure pain score: 0/10 (100% relief) Influential Factors: BMI: 33.63 kg/m Intra-procedural challenges: None observed Assessment challenges: None detected         Post-procedural adverse reactions or complications: None reported Reported side-effects: None  Sedation: Sedation provided. When no sedatives are used, the analgesic levels obtained are directly associated to the effectiveness of the local anesthetics. However, when sedation is provided, the level of analgesia  obtained during the initial 1 hour following the intervention, is believed to be the result of a combination of factors. These factors may include, but are not limited to: 1. The effectiveness of the local anesthetics used. 2. The effects of  the analgesic(s) and/or anxiolytic(s) used. 3. The degree of discomfort experienced by the patient at the time of the procedure. 4. The patients ability and reliability in recalling and recording the events. 5. The presence and influence of possible secondary gains and/or psychosocial factors. Reported result: Relief experienced during the 1st hour after the procedure: 90 % (Ultra-Short Term Relief) Interpretative annotation: Analgesia during this period is likely to be Local Anesthetic and/or IV Sedative (Analgesic/Anxiolitic) related.          Effects of local anesthetic: The analgesic effects attained during this period are directly associated to the localized infiltration of local anesthetics and therefore cary significant diagnostic value as to the etiological location, or anatomical origin, of the pain. Expected duration of relief is directly dependent on the pharmacodynamics of the local anesthetic used. Long-acting (4-6 hours) anesthetics used.  Reported result: Relief during the next 4 to 6 hour after the procedure: 90 % (Short-Term Relief) Interpretative annotation: Complete relief would suggest area to be the source of the pain.          Long-term benefit: Defined as the period of time past the expected duration of local anesthetics. With the possible exception of prolonged sympathetic blockade from the local anesthetics, benefits during this period are typically attributed to, or associated with, other factors such as analgesic sensory neuropraxia, antiinflammatory effects, or beneficial biochemical changes provided by agents other than the local anesthetics Reported result: Extended relief following procedure: 50 % (continues to have one place that is  on the right side that he states "never really got relief") (Long-Term Relief) Interpretative annotation: Good relief. This could suggest inflammation to be a significant component in the etiology to the pain.          Current benefits: Defined as persistent relief that continues at this point in time.   Reported results: Treated area: 50 %       Interpretative annotation: Ongoing benefits would suggest effective therapeutic approach  Interpretation: Results would suggest a successful diagnostic intervention.          Laboratory Chemistry  Inflammation Markers Lab Results  Component Value Date   CRP <0.80 12/06/2016   ESRSEDRATE 9 12/06/2016   (CRP: Acute Phase) (ESR: Chronic Phase) Renal Function Markers Lab Results  Component Value Date   BUN 14 12/06/2016   CREATININE 1.26 (H) 12/06/2016   GFRAA >60 12/06/2016   GFRNONAA >60 12/06/2016   Hepatic Function Markers Lab Results  Component Value Date   AST 35 12/06/2016   ALT 33 12/06/2016   ALBUMIN 4.4 12/06/2016   ALKPHOS 47 12/06/2016   Electrolytes Lab Results  Component Value Date   NA 139 12/06/2016   K 4.6 12/06/2016   CL 103 12/06/2016   CALCIUM 9.6 12/06/2016   MG 2.2 12/06/2016   Neuropathy Markers Lab Results  Component Value Date   VITAMINB12 423 12/06/2016   Bone Pathology Markers Lab Results  Component Value Date   ALKPHOS 47 12/06/2016   25OHVITD1 42 12/06/2016   25OHVITD2 <1.0 12/06/2016   25OHVITD3 42 12/06/2016   CALCIUM 9.6 12/06/2016   Coagulation Parameters No results found for: INR, LABPROT, APTT, PLT Cardiovascular Markers No results found for: BNP, HGB, HCT Note: Lab results reviewed.  Recent Diagnostic Imaging Review  Dg C-arm 1-60 Min-no Report  Result Date: 04/15/2017 Fluoroscopy was utilized by the requesting physician.  No radiographic interpretation.   Note: Imaging results reviewed.          Meds  Current Outpatient  Prescriptions (Endocrine & Metabolic):  .   levothyroxine (SYNTHROID, LEVOTHROID) 25 MCG tablet, TAKE 1 TABLET EVERY MORNING ON AN EMPTY STOMACH WITH A GLASS OF WATERAT LEAST 30 TO 60 MINUTES BEFORE BREAKFAST .  testosterone cypionate (DEPOTESTOSTERONE CYPIONATE) 200 MG/ML injection, INJECT 1 ML INTRAMUSCULARY EVERY 2 WEEKS  Current Outpatient Prescriptions (Cardiovascular):  .  metoprolol (LOPRESSOR) 100 MG tablet, TAKE 1 TABLET BY MOUTH TWICE A DAY  Current Outpatient Prescriptions (Respiratory):  .  albuterol (PROAIR HFA) 108 (90 BASE) MCG/ACT inhaler, Inhale 2 puffs into the lungs every 4 (four) hours as needed for wheezing or shortness of breath.  Marland Kitchen  azelastine (ASTELIN) 0.1 % nasal spray, Place 1 spray into both nostrils 2 (two) times daily.  .  budesonide-formoterol (SYMBICORT) 80-4.5 MCG/ACT inhaler, Inhale 2 puffs into the lungs 2 (two) times daily. .  Loratadine 10 MG CAPS, Take 10 mg by mouth daily.  .  promethazine (PHENERGAN) 12.5 MG tablet, Take by mouth every 4 (four) hours as needed.   Current Outpatient Prescriptions (Analgesics):  .  fentaNYL (DURAGESIC - DOSED MCG/HR) 12 MCG/HR, Place 1 patch (12.5 mcg total) onto the skin every 3 (three) days. Derrill Memo ON 05/19/2017] fentaNYL (DURAGESIC - DOSED MCG/HR) 12 MCG/HR, Place 1 patch (12.5 mcg total) onto the skin every 3 (three) days. .  Oxycodone HCl 20 MG TABS, Take 1 tablet (20 mg total) by mouth every 8 (eight) hours as needed. Derrill Memo ON 05/19/2017] Oxycodone HCl 20 MG TABS, Take 1 tablet (20 mg total) by mouth every 8 (eight) hours. .  fentaNYL (DURAGESIC - DOSED MCG/HR) 12 MCG/HR, Place 1 patch (12.5 mcg total) onto the skin every 3 (three) days. .  Oxycodone HCl 20 MG TABS, Take 1 tablet (20 mg total) by mouth every 8 (eight) hours as needed.  Current Outpatient Prescriptions (Other):   .  methotrexate 50 MG/2ML injection, Inject 50 mg/m2 as directed once a week.  .  ondansetron (ZOFRAN) 4 MG tablet, Take one (1) pill every six hours as needed for nausea. .   sucralfate (CARAFATE) 1 G tablet, Take 1 g by mouth 4 (four) times daily.  .  temazepam (RESTORIL) 15 MG capsule, Take 15 mg by mouth at bedtime as needed for sleep. Marland Kitchen  tiZANidine (ZANAFLEX) 4 MG capsule, Take 4 mg by mouth 3 (three) times daily as needed for muscle spasms.  ROS  Constitutional: Denies any fever or chills Gastrointestinal: No reported hemesis, hematochezia, vomiting, or acute GI distress Musculoskeletal: Denies any acute onset joint swelling, redness, loss of ROM, or weakness Neurological: No reported episodes of acute onset apraxia, aphasia, dysarthria, agnosia, amnesia, paralysis, loss of coordination, or loss of consciousness  Allergies  Mr. Doubek is allergic to penicillins; lactose; nsaids; prednisone; procaine; sulfasalazine; talwin [pentazocine]; codeine; and sulfa antibiotics.  [place]  Drug: Mr. Lowdermilk  reports that he does not use drugs. Alcohol:  reports that he does not drink alcohol. Tobacco:  reports that he has quit smoking. His smoking use included Cigarettes. His smokeless tobacco use includes Chew. Medical:  has a past medical history of Allergy; Anemia; Arthritis; Asthma; Blood transfusion without reported diagnosis; Bronchitis; Bursitis; Cancer ([place]); Cataract; 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09/13/2015); Gastritis; GERD (gastroesophageal reflux disease); Heart murmur; Hepatitis C; Hiatal hernia; Hypertension; Hypothyroidism; Lupus; Obesity; Osteoporosis; Peripheral nerve disease; Psoriatic arthritis ([place]); Sleep apnea; Supraventricular tachycardia ([place]); Tendonitis; and Thyroid disease. Surgical: Mr. Sheley  has a past surgical history that includes Spine surgery; Eye surgery;  Fracture surgery (Right); Fracture surgery (Bilateral); Fracture surgery (Bilateral); Joint replacement (Right); Spinal cord stimulator implant; Spinal cord stimulator removal; Patella arthroplasty; Shoulder arthroscopy (Bilateral);  Esophagogastroduodenoscopy (egd) with propofol (N/A, 01/20/2016); and Colonoscopy (2009). Family: family history includes Arthritis in his father and mother; COPD in his father; Cancer in his father and mother; Diabetes in his mother; Hypertension in his father and mother; Stroke in his mother.  Constitutional Exam  General appearance: Well nourished, well developed, and well hydrated. In no apparent acute distress Vitals:   05/02/17 0802  BP: 122/78  Pulse: 73  Resp: 18  Temp: 98.2 F (36.8 C)  TempSrc: Oral  SpO2: 99%  Weight: 248 lb (112.5 kg)  Height: 6' (1.829 m)   BMI Assessment: Estimated body mass index is 33.63 kg/m as calculated from the following:   Height as of this encounter: 6' (1.829 m).   Weight as of this encounter: 248 lb (112.5 kg).  BMI interpretation table: BMI level Category Range association with higher incidence of chronic pain  <18 kg/m2 Underweight   18.5-24.9 kg/m2 Ideal body weight   25-29.9 kg/m2 Overweight Increased incidence by 20%  30-34.9 kg/m2 Obese (Class I) Increased incidence by 68%  35-39.9 kg/m2 Severe obesity (Class II) Increased incidence by 136%  >40 kg/m2 Extreme obesity (Class III) Increased incidence by 254%   BMI Readings from Last 4 Encounters:  05/02/17 33.63 kg/m  04/15/17 33.23 kg/m  03/07/17 32.55 kg/m  01/17/17 33.61 kg/m   Wt Readings from Last 4 Encounters:  05/02/17 248 lb (112.5 kg)  04/15/17 245 lb (111.1 kg)  03/07/17 240 lb (108.9 kg)  01/17/17 241 lb (109.3 kg)  Psych/Mental status: Alert, oriented x 3 (person, place, & time)       Eyes: PERLA Respiratory: No evidence of acute respiratory distress  Cervical Spine Exam  Inspection: No masses, redness, or swelling Alignment: Symmetrical Functional ROM: Unrestricted ROM      Stability: No instability detected Muscle strength & Tone: Functionally intact Sensory: Unimpaired Palpation: No palpable anomalies              Upper Extremity (UE) Exam    Side:  Right upper extremity  Side: Left upper extremity  Inspection: No masses, redness, swelling, or asymmetry. No contractures  Inspection: No masses, redness, swelling, or asymmetry. No contractures  Functional ROM: Unrestricted ROM          Functional ROM: Unrestricted ROM          Muscle strength & Tone: Functionally intact  Muscle strength & Tone: Functionally intact  Sensory: Unimpaired  Sensory: Unimpaired  Palpation: No palpable anomalies              Palpation: No palpable anomalies              Specialized Test(s): Deferred         Specialized Test(s): Deferred          Thoracic Spine Exam  Inspection: No masses, redness, or swelling Alignment: Symmetrical Functional ROM: Unrestricted ROM Stability: No instability detected Sensory: Unimpaired Muscle strength & Tone: No palpable anomalies  Lumbar Spine Exam  Inspection: No masses, redness, or swelling Alignment: Symmetrical Functional ROM: Decreased ROM      Stability: No instability detected Muscle strength & Tone: Functionally intact Sensory: Movement-associated pain Palpation: Complains of area being tender to palpation       Provocative Tests: Lumbar Hyperextension and rotation test: Positive bilaterally for facet joint pain. Patrick's Maneuver: evaluation deferred today  Gait & Posture Assessment  Ambulation: Unassisted Gait: Relatively normal for age and body habitus Posture: WNL   Lower Extremity Exam    Side: Right lower extremity  Side: Left lower extremity  Inspection: No masses, redness, swelling, or asymmetry. No contractures  Inspection: No masses, redness, swelling, or asymmetry. No contractures  Functional ROM: Unrestricted ROM          Functional ROM: Unrestricted ROM          Muscle strength & Tone: Functionally intact  Muscle strength & Tone: Functionally intact  Sensory: Unimpaired  Sensory: Unimpaired  Palpation: No palpable anomalies  Palpation: No palpable anomalies   Assessment   Primary Diagnosis & Pertinent Problem List: The primary encounter diagnosis was Chronic low back pain (Location of Primary Source of Pain) (Bilateral) (R>L). Diagnoses of Lumbar facet syndrome (Location of Primary Source of Pain) (Bilateral) (R>L), Chronic lower extremity pain (Location of Secondary source of pain) (Bilateral) (L>R), Chronic lumbar radicular pain (Location of Secondary source of pain) (Bilateral) (L>R) (L5 Dermatome), Chronic pain syndrome, Lumbar spondylosis, and Chronic sacroiliac joint pain (Bilateral) (R>L) were also pertinent to this visit.  Status Diagnosis  Improving Improving Controlled 1. Chronic low back pain (Location of Primary Source of Pain) (Bilateral) (R>L)   2. Lumbar facet syndrome (Location of Primary Source of Pain) (Bilateral) (R>L)   3. Chronic lower extremity pain (Location of Secondary source of pain) (Bilateral) (L>R)   4. Chronic lumbar radicular pain (Location of Secondary source of pain) (Bilateral) (L>R) (L5 Dermatome)   5. Chronic pain syndrome   6. Lumbar spondylosis   7. Chronic sacroiliac joint pain (Bilateral) (R>L)     Problems updated and reviewed during this visit: No problems updated. Plan of Care  Pharmacotherapy (Medications Ordered): No orders of the defined types were placed in this encounter.  New Prescriptions   No medications on file   Medications administered today: Mr. Stender had no medications administered during this visit.  Lab-work, procedure(s), and/or referral(s): Orders Placed This Encounter  Procedures  . Radiofrequency,Lumbar    Interventional therapies: Planned, scheduled, and/or pending:   Therapeutic right lumbar facet RFA, under fluoroscopic guidance and IV sedation. The patient is unable to safely do any physical therapy for his low back pain secondary to severe cardiovascular problems including angina pectoris. The patient has evidence of an abnormal echocardiogram.    Considering:   Diagnostic  Right-sided cervical epidural steroid injection.  Diagnostic Right-sided L4-5 lumbar epidural steroid injection.  Diagnostic bilateral lumbar facet block. Possible bilateral lumbar facet radiofrequency ablation. Diagnostic bilateral cervical facet block.  Possible bilateral cervical facet radiofrequency ablation.   Palliative PRN treatment(s):   For the arm pain and numbness, right-sided cervical epidural steroid injection For the lower extremity pain, right-sided L4-5 lumbar epidural steroid injection For the low back pain, diagnostic bilateral lumbar facet block For the neck pain, diagnostic bilateral cervical facet block    Provider-requested follow-up: Return for RFA, (ASAP), by MD.  Future Appointments Date Time Provider [place]  05/30/2017 8:30 AM Vevelyn Francois, NP [facility] None  07/24/2017 10:15 AM Milinda Pointer, MD [facility] None   Primary Care Physician: [name], MD Location: [facility] Outpatient Pain Management Facility Note by: Kathlen Brunswick. Dossie Arbour, M.D, DABA, DABAPM, DABPM, DABIPP, FIPP Date: 05/02/2017; Time: 9:01 AM  Patient Instructions  ____________________________________________________________________________________________  Preparing for Procedure with Sedation Instructions: . Oral Intake: Do not eat or drink anything for at least 8 hours prior to your procedure. . Transportation: Public transportation is not  allowed. Bring an adult driver. The driver must be physically present in our waiting room before any procedure can be started. Marland Kitchen Physical Assistance: Bring an adult physically capable of assisting you, in the event you need help. This adult should keep you company at home for at least 6 hours after the procedure. . Blood Pressure Medicine: Take your blood pressure medicine with a sip of water the morning of the procedure. . Blood thinners:  . Diabetics on insulin: Notify the staff so that you can be scheduled 1st case in the  morning. If your diabetes requires high dose insulin, take only  of your normal insulin dose the morning of the procedure and notify the staff that you have done so. . Preventing infections: Shower with an antibacterial soap the morning of your procedure. . Build-up your immune system: Take 1000 mg of Vitamin C with every meal (3 times a day) the day prior to your procedure. Marland Kitchen Antibiotics: Inform the staff if you have a condition or reason that requires you to take antibiotics before dental procedures. . Pregnancy: If you are pregnant, call and cancel the procedure. . Sickness: If you have a cold, fever, or any active infections, call and cancel the procedure. . Arrival: You must be in the facility at least 30 minutes prior to your scheduled procedure. . Children: Do not bring children with you. . Dress appropriately: Bring dark clothing that you would not mind if they get stained. . Valuables: Do not bring any jewelry or valuables. Procedure appointments are reserved for interventional treatments only. Marland Kitchen No Prescription Refills. . No medication changes will be discussed during procedure appointments. . No disability issues will be discussed. ____________________________________________________________________________________________  Preparing for Procedure with Sedation Instructions: . Oral Intake: Do not eat or drink anything for at least 8 hours prior to your procedure. . Transportation: Public transportation is not allowed. Bring an adult driver. The driver must be physically present in our waiting room before any procedure can be started. Marland Kitchen Physical Assistance: Bring an adult capable of physically assisting you, in the event you need help. . Blood Pressure Medicine: Take your blood pressure medicine with a sip of water the morning of the procedure. . Insulin: Take only  of your normal insulin dose. . Preventing infections: Shower with an antibacterial soap the morning of your  procedure. . Build-up your immune system: Take 1000 mg of Vitamin C with every meal (3 times a day) the day prior to your procedure. . Pregnancy: If you are pregnant, call and cancel the procedure. . Sickness: If you have a cold, fever, or any active infections, call and cancel the procedure. . Arrival: You must be in the facility at least 30 minutes prior to your scheduled procedure. . Children: Do not bring children with you. . Dress appropriately: Bring dark clothing that you would not mind if they get stained. . Valuables: Do not bring any jewelry or valuables. Procedure appointments are reserved for interventional treatments only. Marland Kitchen No Prescription Refills. . No medication changes will be discussed during procedure appointments. No disability issues will be discussed.Radiofrequency Lesioning Radiofrequency lesioning is a procedure that is performed to relieve pain. The procedure is often used for back, neck, or arm pain. Radiofrequency lesioning involves the use of a machine that creates radio waves to make heat. During the procedure, the heat is applied to the nerve that carries the pain signal. The heat damages the nerve and interferes with the pain signal. Pain relief usually starts about  2 weeks after the procedure and lasts for 6 months to 1 year. Tell a health care provider about:  Any allergies you have.  All medicines you are taking, including vitamins, herbs, eye drops, creams, and over-the-counter medicines.  Any problems you or family members have had with anesthetic medicines.  Any blood disorders you have.  Any surgeries you have had.  Any medical conditions you have.  Whether you are pregnant or may be pregnant. What are the risks? Generally, this is a safe procedure. However, problems may occur, including:  Pain or soreness at the injection site.  Infection at the injection site.  Damage to nerves or blood vessels.  What happens before the procedure?  Ask  your health care provider about: ? Changing or stopping your regular medicines. This is especially important if you are taking diabetes medicines or blood thinners. ? Taking medicines such as aspirin and ibuprofen. These medicines can thin your blood. Do not take these medicines before your procedure if your health care provider instructs you not to.  Follow instructions from your health care provider about eating or drinking restrictions.  Plan to have someone take you home after the procedure.  If you go home right after the procedure, plan to have someone with you for 24 hours. What happens during the procedure?  You will be given one or more of the following: ? A medicine to help you relax (sedative). ? A medicine to numb the area (local anesthetic).  You will be awake during the procedure. You will need to be able to talk with the health care provider during the procedure.  With the help of a type of X-ray (fluoroscopy), the health care provider will insert a radiofrequency needle into the area to be treated.  Next, a wire that carries the radio waves (electrode) will be put through the radiofrequency needle. An electrical pulse will be sent through the electrode to verify the correct nerve. You will feel a tingling sensation, and you may have muscle twitching.  Then, the tissue that is around the needle tip will be heated by an electric current that is passed using the radiofrequency machine. This will numb the nerves.  A bandage (dressing) will be put on the insertion area after the procedure is done. The procedure may vary among health care providers and hospitals. What happens after the procedure?  Your blood pressure, heart rate, breathing rate, and blood oxygen level will be monitored often until the medicines you were given have worn off.  Return to your normal activities as directed by your health care provider. This information is not intended to replace advice given to  you by your health care provider. Make sure you discuss any questions you have with your health care provider. Document Released: 06/20/2011 Document Revised: 03/29/2016 Document Reviewed: 11/29/2014 Elsevier Interactive Patient Education  [employer].

## 2017-05-02 NOTE — Patient Instructions (Addendum)
____________________________________________________________________________________________  Preparing for Procedure with Sedation Instructions: . Oral Intake: Do not eat or drink anything for at least 8 hours prior to your procedure. . Transportation: Public transportation is not allowed. Bring an adult driver. The driver must be physically present in our waiting room before any procedure can be started. [place] Kitchen Physical Assistance: Bring an adult physically capable of assisting you, in the event you need help. This adult should keep you company at home for at least 6 hours after the procedure. . Blood Pressure Medicine: Take your blood pressure medicine with a sip of water the morning of the procedure. . Blood thinners:  . Diabetics on insulin: Notify the staff so that you can be scheduled 1st case in the morning. If your diabetes requires high dose insulin, take only  of your normal insulin dose the morning of the procedure and notify the staff that you have done so. . Preventing infections: Shower with an antibacterial soap the morning of your procedure. . Build-up your immune system: Take 1000 mg of Vitamin C with every meal (3 times a day) the day prior to your procedure. Marland Kitchen Antibiotics: Inform the staff if you have a condition or reason that requires you to take antibiotics before dental procedures. . Pregnancy: If you are pregnant, call and cancel the procedure. . Sickness: If you have a cold, fever, or any active infections, call and cancel the procedure. . Arrival: You must be in the facility at least 30 minutes prior to your scheduled procedure. . Children: Do not bring children with you. . Dress appropriately: Bring dark clothing that you would not mind if they get stained. . Valuables: Do not bring any jewelry or valuables. Procedure appointments are reserved for interventional treatments only. Marland Kitchen No Prescription Refills. . No medication changes will be discussed during procedure  appointments. . No disability issues will be discussed. ____________________________________________________________________________________________  Preparing for Procedure with Sedation Instructions: . Oral Intake: Do not eat or drink anything for at least 8 hours prior to your procedure. . Transportation: Public transportation is not allowed. Bring an adult driver. The driver must be physically present in our waiting room before any procedure can be started. Marland Kitchen Physical Assistance: Bring an adult capable of physically assisting you, in the event you need help. . Blood Pressure Medicine: Take your blood pressure medicine with a sip of water the morning of the procedure. . Insulin: Take only  of your normal insulin dose. . Preventing infections: Shower with an antibacterial soap the morning of your procedure. . Build-up your immune system: Take 1000 mg of Vitamin C with every meal (3 times a day) the day prior to your procedure. . Pregnancy: If you are pregnant, call and cancel the procedure. . Sickness: If you have a cold, fever, or any active infections, call and cancel the procedure. . Arrival: You must be in the facility at least 30 minutes prior to your scheduled procedure. . Children: Do not bring children with you. . Dress appropriately: Bring dark clothing that you would not mind if they get stained. . Valuables: Do not bring any jewelry or valuables. Procedure appointments are reserved for interventional treatments only. Marland Kitchen No Prescription Refills. . No medication changes will be discussed during procedure appointments. No disability issues will be discussed.Radiofrequency Lesioning Radiofrequency lesioning is a procedure that is performed to relieve pain. The procedure is often used for back, neck, or arm pain. Radiofrequency lesioning involves the use of a machine that creates radio waves to make heat.  During the procedure, the heat is applied to the nerve that carries the pain  signal. The heat damages the nerve and interferes with the pain signal. Pain relief usually starts about 2 weeks after the procedure and lasts for 6 months to 1 year. Tell a health care provider about:  Any allergies you have.  All medicines you are taking, including vitamins, herbs, eye drops, creams, and over-the-counter medicines.  Any problems you or family members have had with anesthetic medicines.  Any blood disorders you have.  Any surgeries you have had.  Any medical conditions you have.  Whether you are pregnant or may be pregnant. What are the risks? Generally, this is a safe procedure. However, problems may occur, including:  Pain or soreness at the injection site.  Infection at the injection site.  Damage to nerves or blood vessels.  What happens before the procedure?  Ask your health care provider about: ? Changing or stopping your regular medicines. This is especially important if you are taking diabetes medicines or blood thinners. ? Taking medicines such as aspirin and ibuprofen. These medicines can thin your blood. Do not take these medicines before your procedure if your health care provider instructs you not to.  Follow instructions from your health care provider about eating or drinking restrictions.  Plan to have someone take you home after the procedure.  If you go home right after the procedure, plan to have someone with you for 24 hours. What happens during the procedure?  You will be given one or more of the following: ? A medicine to help you relax (sedative). ? A medicine to numb the area (local anesthetic).  You will be awake during the procedure. You will need to be able to talk with the health care provider during the procedure.  With the help of a type of X-ray (fluoroscopy), the health care provider will insert a radiofrequency needle into the area to be treated.  Next, a wire that carries the radio waves (electrode) will be put through  the radiofrequency needle. An electrical pulse will be sent through the electrode to verify the correct nerve. You will feel a tingling sensation, and you may have muscle twitching.  Then, the tissue that is around the needle tip will be heated by an electric current that is passed using the radiofrequency machine. This will numb the nerves.  A bandage (dressing) will be put on the insertion area after the procedure is done. The procedure may vary among health care providers and hospitals. What happens after the procedure?  Your blood pressure, heart rate, breathing rate, and blood oxygen level will be monitored often until the medicines you were given have worn off.  Return to your normal activities as directed by your health care provider. This information is not intended to replace advice given to you by your health care provider. Make sure you discuss any questions you have with your health care provider. Document Released: 06/20/2011 Document Revised: 03/29/2016 Document Reviewed: 11/29/2014 Elsevier Interactive Patient Education  [employer].

## 2017-05-06 DIAGNOSIS — M7542 Impingement syndrome of left shoulder: Secondary | ICD-10-CM | POA: Diagnosis not present

## 2017-05-06 DIAGNOSIS — M19019 Primary osteoarthritis, unspecified shoulder: Secondary | ICD-10-CM | POA: Diagnosis not present

## 2017-05-06 DIAGNOSIS — M7541 Impingement syndrome of right shoulder: Secondary | ICD-10-CM | POA: Diagnosis not present

## 2017-05-23 DIAGNOSIS — Z79899 Other long term (current) drug therapy: Secondary | ICD-10-CM | POA: Diagnosis not present

## 2017-05-23 DIAGNOSIS — M1990 Unspecified osteoarthritis, unspecified site: Secondary | ICD-10-CM | POA: Diagnosis not present

## 2017-05-30 ENCOUNTER — Ambulatory Visit: Payer: PPO | Attending: Pain Medicine | Admitting: Nurse Practitioner

## 2017-05-30 ENCOUNTER — Encounter: Payer: Self-pay | Admitting: Nurse Practitioner

## 2017-05-30 VITALS — BP 127/75 | HR 69 | Temp 98.2°F | Resp 16 | Ht 72.0 in | Wt 250.0 lb

## 2017-05-30 DIAGNOSIS — Z5181 Encounter for therapeutic drug level monitoring: Secondary | ICD-10-CM | POA: Insufficient documentation

## 2017-05-30 DIAGNOSIS — I471 Supraventricular tachycardia: Secondary | ICD-10-CM | POA: Insufficient documentation

## 2017-05-30 DIAGNOSIS — D649 Anemia, unspecified: Secondary | ICD-10-CM | POA: Insufficient documentation

## 2017-05-30 DIAGNOSIS — Z9889 Other specified postprocedural states: Secondary | ICD-10-CM | POA: Diagnosis not present

## 2017-05-30 DIAGNOSIS — E119 Type 2 diabetes mellitus without complications: Secondary | ICD-10-CM | POA: Insufficient documentation

## 2017-05-30 DIAGNOSIS — M25562 Pain in left knee: Secondary | ICD-10-CM | POA: Diagnosis not present

## 2017-05-30 DIAGNOSIS — I10 Essential (primary) hypertension: Secondary | ICD-10-CM | POA: Insufficient documentation

## 2017-05-30 DIAGNOSIS — L4050 Arthropathic psoriasis, unspecified: Secondary | ICD-10-CM | POA: Diagnosis not present

## 2017-05-30 DIAGNOSIS — G8929 Other chronic pain: Secondary | ICD-10-CM

## 2017-05-30 DIAGNOSIS — Z79891 Long term (current) use of opiate analgesic: Secondary | ICD-10-CM | POA: Diagnosis not present

## 2017-05-30 DIAGNOSIS — M533 Sacrococcygeal disorders, not elsewhere classified: Secondary | ICD-10-CM | POA: Diagnosis not present

## 2017-05-30 DIAGNOSIS — Z833 Family history of diabetes mellitus: Secondary | ICD-10-CM | POA: Diagnosis not present

## 2017-05-30 DIAGNOSIS — Z823 Family history of stroke: Secondary | ICD-10-CM | POA: Diagnosis not present

## 2017-05-30 DIAGNOSIS — G894 Chronic pain syndrome: Secondary | ICD-10-CM | POA: Insufficient documentation

## 2017-05-30 DIAGNOSIS — R200 Anesthesia of skin: Secondary | ICD-10-CM | POA: Diagnosis not present

## 2017-05-30 DIAGNOSIS — M479 Spondylosis, unspecified: Secondary | ICD-10-CM | POA: Insufficient documentation

## 2017-05-30 DIAGNOSIS — M79601 Pain in right arm: Secondary | ICD-10-CM | POA: Insufficient documentation

## 2017-05-30 DIAGNOSIS — M5412 Radiculopathy, cervical region: Secondary | ICD-10-CM | POA: Diagnosis not present

## 2017-05-30 DIAGNOSIS — K219 Gastro-esophageal reflux disease without esophagitis: Secondary | ICD-10-CM | POA: Diagnosis not present

## 2017-05-30 DIAGNOSIS — Z87891 Personal history of nicotine dependence: Secondary | ICD-10-CM | POA: Diagnosis not present

## 2017-05-30 DIAGNOSIS — M549 Dorsalgia, unspecified: Secondary | ICD-10-CM | POA: Diagnosis not present

## 2017-05-30 DIAGNOSIS — G5641 Causalgia of right upper limb: Secondary | ICD-10-CM | POA: Diagnosis not present

## 2017-05-30 DIAGNOSIS — M47816 Spondylosis without myelopathy or radiculopathy, lumbar region: Secondary | ICD-10-CM

## 2017-05-30 DIAGNOSIS — M4696 Unspecified inflammatory spondylopathy, lumbar region: Secondary | ICD-10-CM | POA: Diagnosis not present

## 2017-05-30 DIAGNOSIS — G9619 Other disorders of meninges, not elsewhere classified: Secondary | ICD-10-CM | POA: Insufficient documentation

## 2017-05-30 DIAGNOSIS — Z809 Family history of malignant neoplasm, unspecified: Secondary | ICD-10-CM | POA: Insufficient documentation

## 2017-05-30 DIAGNOSIS — M545 Low back pain, unspecified: Secondary | ICD-10-CM

## 2017-05-30 DIAGNOSIS — M329 Systemic lupus erythematosus, unspecified: Secondary | ICD-10-CM | POA: Diagnosis not present

## 2017-05-30 MED ORDER — FENTANYL 12 MCG/HR TD PT72: 12.5000 ug | MEDICATED_PATCH | TRANSDERMAL | 0 refills | Status: DC

## 2017-05-30 MED ORDER — OXYCODONE HCL 20 MG PO TABS: 20.0000 mg | ORAL_TABLET | Freq: Three times a day (TID) | ORAL | 0 refills | Status: DC

## 2017-05-30 MED ORDER — OXYCODONE HCL 20 MG PO TABS: 20.0000 mg | ORAL_TABLET | Freq: Three times a day (TID) | ORAL | 0 refills | Status: DC | PRN

## 2017-05-30 NOTE — Patient Instructions (Addendum)
____________________________________________________________________________________________  Medication Rules  Applies to: All patients receiving prescriptions (written or electronic).  Pharmacy of record: Pharmacy where electronic prescriptions will be sent. If written prescriptions are taken to a different pharmacy, please inform the nursing staff. The pharmacy listed in the electronic medical record should be the one where you would like electronic prescriptions to be sent.  Prescription refills: Only during scheduled appointments. Applies to both, written and electronic prescriptions.  NOTE: The following applies primarily to controlled substances (Opioid* Pain Medications).   Patient's responsibilities: 1. Pain Pills: Bring all pain pills to every appointment (except for procedure appointments). 2. Pill Bottles: Bring pills in original pharmacy bottle. Always bring newest bottle. Bring bottle, even if empty. 3. Medication refills: You are responsible for knowing and keeping track of what medications you need refilled. The day before your appointment, write a list of all prescriptions that need to be refilled. Bring that list to your appointment and give it to the admitting nurse. Prescriptions will be written only during appointments. If you forget a medication, it will not be "Called in", "Faxed", or "electronically sent". You will need to get another appointment to get these prescribed. 4. Prescription Accuracy: You are responsible for carefully inspecting your prescriptions before leaving our office. Have the discharge nurse carefully go over each prescription with you, before taking them home. Make sure that your name is accurately spelled, that your address is correct. Check the name and dose of your medication to make sure it is accurate. Check the number of pills, and the written instructions to make sure they are clear and accurate. Make sure that you are given enough medication to  last until your next medication refill appointment. 5. Taking Medication: Take medication as prescribed. Never take more pills than instructed. Never take medication more frequently than prescribed. Taking less pills or less frequently is permitted and encouraged, when it comes to controlled substances (written prescriptions).  6. Inform other Doctors: Always inform, all of your healthcare providers, of all the medications you take. 7. Pain Medication from other Providers: You are not allowed to accept any additional pain medication from any other Doctor or Healthcare provider. There are two exceptions to this rule. (see below) In the event that you require additional pain medication, you are responsible for notifying us, as stated below. 8. Medication Agreement: You are responsible for carefully reading and following our Medication Agreement. This must be signed before receiving any prescriptions from our practice. Safely store a copy of your signed Agreement. Violations to the Agreement will result in no further prescriptions. (Additional copies of our Medication Agreement are available upon request.) 9. Laws, Rules, & Regulations: All patients are expected to follow all Federal and State Laws, Statutes, Rules, & Regulations. Ignorance of the Laws does not constitute a valid excuse. The use of any illegal substances is prohibited. 10. Adopted CDC guidelines & recommendations: Target dosing levels will be at or below 60 MME/day. Use of benzodiazepines** is not recommended.  Exceptions: There are only two exceptions to the rule of not receiving pain medications from other Healthcare Providers. 1. Exception #1 (Emergencies): In the event of an emergency (i.e.: accident requiring emergency care), you are allowed to receive additional pain medication. However, you are responsible for: As soon as you are able, call our office [phone], at any time of the day or night, and leave a message stating your  name, the date and nature of the emergency, and the name and dose of the medication   prescribed. In the event that your call is answered by a member of our staff, make sure to document and save the date, time, and the name of the person that took your information.  2. Exception #2 (Planned Surgery): In the event that you are scheduled by another doctor or dentist to have any type of surgery or procedure, you are allowed (for a period no longer than 30 days), to receive additional pain medication, for the acute post-op pain. However, in this case, you are responsible for picking up a copy of our "Post-op Pain Management for Surgeons" handout, and giving it to your surgeon or dentist. This document is available at our office, and does not require an appointment to obtain it. Simply go to our office during business hours (Monday-Thursday from 8:00 AM to 4:00 PM) (Friday 8:00 AM to 12:00 Noon) or if you have a scheduled appointment with [place], prior to your surgery, and ask for it by name. In addition, you will need to provide [place] with your name, name of your surgeon, type of surgery, and date of procedure or surgery.  *Opioid medications include: morphine, codeine, oxycodone, oxymorphone, hydrocodone, hydromorphone, meperidine, tramadol, tapentadol, buprenorphine, fentanyl, methadone. **Benzodiazepine medications include: diazepam (Valium), alprazolam (Xanax), clonazepam (Klonopine), lorazepam (Ativan), clorazepate (Tranxene), chlordiazepoxide (Librium), estazolam (Prosom), oxazepam (Serax), temazepam (Restoril), triazolam (Halcion)  You were given 3 prescriptions each for Fentanyl patch and Oxycodone today.  ____________________________________________________________________________________________

## 2017-05-30 NOTE — Progress Notes (Signed)
Nursing Pain Medication Assessment:  Safety precautions to be maintained throughout the outpatient stay will include: orient to surroundings, keep bed in low position, maintain call bell within reach at all times, provide assistance with transfer out of bed and ambulation.  Medication Inspection Compliance: Pill count conducted under aseptic conditions, in front of the patient. Neither the pills nor the bottle was removed from the patient's sight at any time. Once count was completed pills were immediately returned to the patient in their original bottle.  Medication #1: Fentanyl patch Pill/Patch Count: 10 of 10 pills remain Pill/Patch Appearance: Markings consistent with prescribed medication Bottle Appearance: Standard pharmacy container. Clearly labeled. Filled Date: 07 / 24 / 2018 Last Medication intake:  Day before yesterday  Medication #2: Oxycodone IR Pill/Patch Count: 82 of 90 pills remain Pill/Patch Appearance: Markings consistent with prescribed medication Bottle Appearance: Standard pharmacy container. Clearly labeled. Filled Date: 07 / 24 / 2018 Last Medication intake:  Today

## 2017-05-30 NOTE — Progress Notes (Signed)
Patient's Name: James [name] DOBEK  MRN: [id number]  Referring Provider: [name], MD  DOB: 02/08/1956  PCP: [name], MD  DOS: 05/30/2017  Note by: Vevelyn Francois NP  Service setting: Ambulatory outpatient  Specialty: Interventional Pain Management  Location: [facility] (AMB) Pain Management Facility    Patient type: Established    Primary Reason(s) for Visit: Encounter for prescription drug management. (Level of risk: moderate)  CC: Back Pain (low and upper)  HPI  [name] is a 61 y.o. year old, male patient, who comes today for a medication management evaluation. He has Airway hyperreactivity; Atrial fibrillation ([place]); Narrowing of intervertebral disc space; Abnormal echocardiogram; Gastric ulcer; Gastric catarrh; Acid reflux; Awareness of heartbeats; Hemorrhoid; Hepatitis; BP (high blood pressure); Lactose intolerance; Osteopenia; APC (atrial premature contractions); Apnea, sleep; Supraventricular tachycardia ([place]); Long term current use of opiate analgesic; Long term prescription opiate use; Opiate use (120 MME/Day); Opiate dependence ([place]); Encounter for therapeutic drug level monitoring; Chronic low back pain (Location of Primary Source of Pain) (Bilateral) (R>L); Lumbar spondylosis; Chronic neck pain (Right); Cervical spondylosis (C7 intravertebral body cyst); Chronic cervical radicular pain (Right); Chronic lumbar radicular pain (Location of Secondary source of pain) (Bilateral) (L>R) (L5 Dermatome); Osteoarthritis; Chronic hip pain; Chronic knee pain (Bilateral) (R>L); Complex regional pain syndrome type II of upper extremity (Right); Chronic upper extremity pain (Right); 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); Epidural  fibrosis; Musculoskeletal pain; Neurogenic pain; Neuropathic pain; Chronic sacroiliac joint pain (Bilateral) (R>L); Psoriatic arthritis ([place]); Controlled type 2 diabetes mellitus without complication ([place]); Chest pain; Acquired hypothyroidism; Chronic lower extremity pain (Location of Secondary source of pain) (Bilateral) (L>R); Psoriasis with arthropathy ([place]); Hypothyroidism; Numbness and tingling of both legs; Abnormal nerve conduction studies (06/20/16); Chronic pain syndrome; Hypotestosteronism; and Nausea without vomiting on his problem list. His primarily concern today is the Back Pain (low and upper)  Pain Assessment: Location: Upper, Lower Back Radiating: upper back radiates down both arms, low back radiates down both legs to toes Onset: More than a month ago Duration: Chronic pain Quality: Aching, Sharp, Stabbing, Throbbing, Cramping Severity: 4 /10 (self-reported pain score)  Note: Reported level is compatible with observation.                   Effect on ADL:   Timing: Constant Modifying factors: meds  Mr. Mauger was last scheduled for an appointment on Visit date not found for medication management. During today's appointment we reviewed [name] chronic pain status, as well as his outpatient medication regimen.  He admits that he does have numbness and tingling in his upper and lower extremities..  He states that he uses the Tizanidine when he gets cramps in the backs of his legs. He admits that dietary changes helps with constipation.   The patient  reports that he does not use drugs. His body mass index is 33.91 kg/m.  Further details on both, my assessment(s), as well as the proposed treatment plan, please see below.  Controlled Substance Pharmacotherapy Assessment REMS (Risk Evaluation and Mitigation Strategy)  Analgesic:Fentanyl patch 12.5 mcg/h every 72 hours + oxycodone IR 20 mg every 6 hours (60 mg/day) MME/day:120 mg/day  [name], RN  05/30/2017  8:37 AM   Signed Nursing Pain Medication Assessment:  Safety precautions to be maintained throughout the outpatient  stay will include: orient to surroundings, keep bed in low position, maintain call bell within reach at all times, provide assistance with transfer out of bed and ambulation.  Medication Inspection Compliance: Pill count conducted under aseptic conditions, in front of the patient. Neither the pills nor the bottle was removed from the patient's sight at any time. Once count was completed pills were immediately returned to the patient in their original bottle.  Medication #1: Fentanyl patch Pill/Patch Count: 10 of 10 pills remain Pill/Patch Appearance: Markings consistent with prescribed medication Bottle Appearance: Standard pharmacy container. Clearly labeled. Filled Date: 07 / 24 / 2018 Last Medication intake:  Day before yesterday  Medication #2: Oxycodone IR Pill/Patch Count: 82 of 90 pills remain Pill/Patch Appearance: Markings consistent with prescribed medication Bottle Appearance: Standard pharmacy container. Clearly labeled. Filled Date: 07 / 24 / 2018 Last Medication intake:  Today   Pharmacokinetics: Liberation and absorption (onset of action): WNL Distribution (time to peak effect): WNL Metabolism and excretion (duration of action): WNL         Pharmacodynamics: Desired effects: Analgesia: Mr. Frappier reports >50% benefit. Functional ability: Patient reports that medication allows him to accomplish basic ADLs Clinically meaningful improvement in function (CMIF): Sustained CMIF goals met Perceived effectiveness: Described as relatively effective, allowing for increase in activities of daily living (ADL) Undesirable effects: Side-effects or Adverse reactions: None reported Monitoring: [place] PMP: Online review of the past 55-monthperiod conducted. Compliant with practice rules and regulations List of all UDS test(s) done:  Lab Results  Component Value Date    TOXASSSELUR FINAL 12/06/2016   TOXASSSELUR FINAL 03/07/2016   TOXASSSELUR FINAL 12/12/2015   TReeseFINAL 09/12/2015   Last UDS on record: Tox[employer] Select 13  Date Value Ref Range Status  12/06/2016 FINAL  Final    Comment:    ==================================================================== TOXASSURE SELECT 13 (MW) ==================================================================== Test                             Result       Flag       Units Drug Present and Declared for Prescription Verification   Oxazepam                       24           EXPECTED   ng/mg creat   Temazepam                      33           EXPECTED   ng/mg creat    Oxazepam and temazepam are expected metabolites of diazepam.    Oxazepam is also an expected metabolite of other benzodiazepine    drugs, including chlordiazepoxide, prazepam, clorazepate,    halazepam, and temazepam.  Oxazepam and temazepam are available    as scheduled prescription medications.   Oxycodone                      2572         EXPECTED   ng/mg creat   Oxymorphone                    196          EXPECTED   ng/mg creat   Noroxycodone                   >3788  EXPECTED   ng/mg creat   Noroxymorphone                 1452         EXPECTED   ng/mg creat    Sources of oxycodone are scheduled prescription medications.    Oxymorphone, noroxycodone, and noroxymorphone are expected    metabolites of oxycodone. Oxymorphone is also available as a    scheduled prescription medication.   Fentanyl                       5            EXPECTED   ng/mg creat   Norfentanyl                    77           EXPECTED   ng/mg creat    Source of fentanyl is a scheduled prescription medication,    including IV, patch, and transmucosal formulations. Norfentanyl    is an expected metabolite of fentanyl. ==================================================================== Test                      Result    Flag   Units      Ref Range   Creatinine               264              mg/dL      >=20 ==================================================================== Declared Medications:  The flagging and interpretation on this report are based on the  following declared medications.  Unexpected results may arise from  inaccuracies in the declared medications.  **Note: The testing scope of this panel includes these medications:  Fentanyl (Duragesic)  Oxycodone  Temazepam (Restoril)  **Note: The testing scope of this panel does not include following  reported medications:  Albuterol  Azelastine  Budenoside (Symbicort)  Formoterol (Symbicort)  Levothyroxine  Loratadine  Methotrexate  Metoprolol (Lopressor)  Promethazine (Phenergan)  Sucralfate (Carafate)  Testosterone (Depo-Testosterone)  Tizanidine (Zanaflex) ==================================================================== For clinical consultation, please call [phone]. ====================================================================    UDS interpretation: Compliant          Medication Assessment Form: Reviewed. Patient indicates being compliant with therapy Treatment compliance: Compliant Risk Assessment Profile: Aberrant behavior: See prior evaluations. None observed or detected today Comorbid factors increasing risk of overdose: See prior notes. No additional risks detected today Risk of substance use disorder (SUD): Low Opioid Risk Tool (ORT) Total Score: 0  Interpretation Table:  Score <3 = Low Risk for SUD  Score between 4-7 = Moderate Risk for SUD  Score >8 = High Risk for Opioid Abuse   Risk Mitigation Strategies:  Patient Counseling: Covered Patient-Prescriber Agreement (PPA): Present and active  Notification to other healthcare providers: Done  Pharmacologic Plan: No change in therapy, at this time  Laboratory Chemistry  Inflammation Markers (CRP: Acute Phase) (ESR: Chronic Phase) Lab Results  Component Value Date   CRP <0.80 12/06/2016    ESRSEDRATE 9 12/06/2016                 Renal Function Markers Lab Results  Component Value Date   BUN 14 12/06/2016   CREATININE 1.26 (H) 12/06/2016   GFRAA >60 12/06/2016   GFRNONAA >60 12/06/2016                 Hepatic Function Markers Lab Results  Component Value Date   AST 35  12/06/2016   ALT 33 12/06/2016   ALBUMIN 4.4 12/06/2016   ALKPHOS 47 12/06/2016                 Electrolytes Lab Results  Component Value Date   NA 139 12/06/2016   K 4.6 12/06/2016   CL 103 12/06/2016   CALCIUM 9.6 12/06/2016   MG 2.2 12/06/2016                 Neuropathy Markers Lab Results  Component Value Date   VITAMINB12 423 12/06/2016                 Bone Pathology Markers Lab Results  Component Value Date   ALKPHOS 47 12/06/2016   25OHVITD1 42 12/06/2016   25OHVITD2 <1.0 12/06/2016   25OHVITD3 42 12/06/2016   CALCIUM 9.6 12/06/2016                 Coagulation Parameters No results found for: INR, LABPROT, APTT, PLT               Cardiovascular Markers No results found for: BNP, HGB, HCT               Note: Lab results reviewed.  Recent Diagnostic Imaging Review  Dg C-arm 1-60 Min-no Report  Result Date: 04/15/2017 Fluoroscopy was utilized by the requesting physician.  No radiographic interpretation.   Note: Imaging results reviewed.          Meds   Current Meds  Medication Sig  . albuterol (PROAIR HFA) 108 (90 BASE) MCG/ACT inhaler Inhale 2 puffs into the lungs every 4 (four) hours as needed for wheezing or shortness of breath.   Marland Kitchen azelastine (ASTELIN) 0.1 % nasal spray Place 1 spray into both nostrils 2 (two) times daily.   . budesonide-formoterol (SYMBICORT) 80-4.5 MCG/ACT inhaler Inhale 2 puffs into the lungs 2 (two) times daily.  Derrill Memo ON 06/18/2017] fentaNYL (DURAGESIC - DOSED MCG/HR) 12 MCG/HR Place 1 patch (12.5 mcg total) onto the skin every 3 (three) days.  . Loratadine 10 MG CAPS Take 10 mg by mouth daily.   . methotrexate 50 MG/2ML injection  Inject 50 mg/m2 as directed once a week.   . metoprolol (LOPRESSOR) 100 MG tablet TAKE 1 TABLET BY MOUTH TWICE A DAY  . [START ON 06/18/2017] Oxycodone HCl 20 MG TABS Take 1 tablet (20 mg total) by mouth every 8 (eight) hours.  . promethazine (PHENERGAN) 12.5 MG tablet Take by mouth every 4 (four) hours as needed.   . sucralfate (CARAFATE) 1 G tablet Take 1 g by mouth 4 (four) times daily.   . temazepam (RESTORIL) 15 MG capsule Take 15 mg by mouth at bedtime as needed for sleep.  Marland Kitchen tiZANidine (ZANAFLEX) 4 MG capsule Take 4 mg by mouth 3 (three) times daily as needed for muscle spasms.  . [DISCONTINUED] fentaNYL (DURAGESIC - DOSED MCG/HR) 12 MCG/HR Place 1 patch (12.5 mcg total) onto the skin every 3 (three) days.  . [DISCONTINUED] Oxycodone HCl 20 MG TABS Take 1 tablet (20 mg total) by mouth every 8 (eight) hours.    ROS  Constitutional: Denies any fever or chills Gastrointestinal: No reported hemesis, hematochezia, vomiting, or acute GI distress Musculoskeletal: Denies any acute onset joint swelling, redness, loss of ROM, or weakness Neurological: No reported episodes of acute onset apraxia, aphasia, dysarthria, agnosia, amnesia, paralysis, loss of coordination, or loss of consciousness  Allergies  Mr. Blucher is allergic to penicillins; lactose; nsaids; prednisone; procaine; sulfasalazine; talwin [  pentazocine]; codeine; and sulfa antibiotics.  [place]  Drug: Mr. Prowse  reports that he does not use drugs. Alcohol:  reports that he does not drink alcohol. Tobacco:  reports that he has quit smoking. His smoking use included Cigarettes. His smokeless tobacco use includes Chew. Medical:  has a past medical history of Allergy; Anemia; Arthritis; Asthma; Blood transfusion without reported diagnosis; Bronchitis; Bursitis; Cancer ([place]); Cataract; 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09/13/2015); Gastritis; GERD (gastroesophageal reflux disease); Heart murmur; Hepatitis  C; Hiatal hernia; Hypertension; Hypothyroidism; Lupus; Obesity; Osteoporosis; Peripheral nerve disease; Psoriatic arthritis ([place]); Sleep apnea; Supraventricular tachycardia ([place]); Tendonitis; and Thyroid disease. Surgical: Mr. Tsuda  has a past surgical history that includes Spine surgery; Eye surgery; Fracture surgery (Right); Fracture surgery (Bilateral); Fracture surgery (Bilateral); Joint replacement (Right); Spinal cord stimulator implant; Spinal cord stimulator removal; Patella arthroplasty; Shoulder arthroscopy (Bilateral); Esophagogastroduodenoscopy (egd) with propofol (N/A, 01/20/2016); and Colonoscopy (2009). Family: family history includes Arthritis in his father and mother; COPD in his father; Cancer in his father and mother; Diabetes in his mother; Hypertension in his father and mother; Stroke in his mother.  Constitutional Exam  General appearance: Well nourished, well developed, and well hydrated. In no apparent acute distress Vitals:   05/30/17 0827  BP: 127/75  Pulse: 69  Resp: 16  Temp: 98.2 F (36.8 C)  SpO2: 96%  Weight: 250 lb (113.4 kg)  Height: 6' (1.829 m)   BMI Assessment: Estimated body mass index is 33.91 kg/m as calculated from the following:   Height as of this encounter: 6' (1.829 m).   Weight as of this encounter: 250 lb (113.4 kg).  BMI interpretation table: BMI level Category Range association with higher incidence of chronic pain  <18 kg/m2 Underweight   18.5-24.9 kg/m2 Ideal body weight   25-29.9 kg/m2 Overweight Increased incidence by 20%  30-34.9 kg/m2 Obese (Class I) Increased incidence by 68%  35-39.9 kg/m2 Severe obesity (Class II) Increased incidence by 136%  >40 kg/m2 Extreme obesity (Class III) Increased incidence by 254%   BMI Readings from Last 4 Encounters:  05/30/17 33.91 kg/m  05/02/17 33.63 kg/m  04/15/17 33.23 kg/m  03/07/17 32.55 kg/m   Wt Readings from Last 4 Encounters:  05/30/17 250 lb (113.4 kg)  05/02/17 248 lb  (112.5 kg)  04/15/17 245 lb (111.1 kg)  03/07/17 240 lb (108.9 kg)  Psych/Mental status: Alert, oriented x 3 (person, place, & time)       Eyes: PERLA Respiratory: No evidence of acute respiratory distress  Cervical Spine Exam  Inspection: No masses, redness, or swelling Alignment: Symmetrical Functional ROM: Unrestricted ROM      Stability: No instability detected Muscle strength & Tone: Functionally intact Sensory: Unimpaired Palpation: No palpable anomalies              Upper Extremity (UE) Exam    Side: Right upper extremity  Side: Left upper extremity  Inspection: No masses, redness, swelling, or asymmetry. No contractures  Inspection: No masses, redness, swelling, or asymmetry. No contractures  Functional ROM: Unrestricted ROM          Functional ROM: Unrestricted ROM          Muscle strength & Tone: Functionally intact  Muscle strength & Tone: Functionally intact  Sensory: Unimpaired  Sensory: Unimpaired  Palpation: No palpable anomalies              Palpation: No palpable anomalies  Specialized Test(s): Deferred         Specialized Test(s): Deferred          Thoracic Spine Exam  Inspection: No masses, redness, or swelling Alignment: Symmetrical Functional ROM: Unrestricted ROM Stability: No instability detected Sensory: Unimpaired Muscle strength & Tone: No palpable anomalies  Lumbar Spine Exam  Inspection: No masses, redness, or swelling Alignment: Symmetrical Functional ROM: Unrestricted ROM      Stability: No instability detected Muscle strength & Tone: Functionally intact Sensory: Unimpaired Palpation: Complains of area being tender to palpation       Provocative Tests: Lumbar Hyperextension and rotation test: evaluation deferred today       Lumbar Lateral bending test: evaluation deferred today       Patrick's Maneuver: evaluation deferred today                    Gait & Posture Assessment  Ambulation: Unassisted Gait: Relatively normal for age  and body habitus Posture: WNL   Lower Extremity Exam    Side: Right lower extremity  Side: Left lower extremity  Inspection: No masses, redness, swelling, or asymmetry. No contractures  Inspection: No masses, redness, swelling, or asymmetry. No contractures  Functional ROM: Unrestricted ROM          Functional ROM: Unrestricted ROM          Muscle strength & Tone: Functionally intact  Muscle strength & Tone: Functionally intact  Sensory: Unimpaired  Sensory: Unimpaired  Palpation: No palpable anomalies  Palpation: No palpable anomalies   Assessment  Primary Diagnosis & Pertinent Problem List: The primary encounter diagnosis was Chronic low back pain (Location of Primary Source of Pain) (Bilateral) (R>L). Diagnoses of Lumbar facet syndrome (Location of Primary Source of Pain) (Bilateral) (R>L), Lumbar spondylosis, Complex regional pain syndrome type II of upper extremity (Right), and Chronic pain syndrome were also pertinent to this visit.  Status Diagnosis  Persistent Persistent Persistent 1. Chronic low back pain (Location of Primary Source of Pain) (Bilateral) (R>L)   2. Lumbar facet syndrome (Location of Primary Source of Pain) (Bilateral) (R>L)   3. Lumbar spondylosis   4. Complex regional pain syndrome type II of upper extremity (Right)   5. Chronic pain syndrome     Problems updated and reviewed during this visit: Problem  Chronic Pain Syndrome  Chronic lower extremity pain (Location of Secondary source of pain) (Bilateral) (L>R)  Complex regional pain syndrome type II of upper extremity (Right)  Chronic upper extremity pain (Right)  Myofascial pain syndrome, cervical (rhomboid muscles) (intermittent)  Lumbar facet syndrome (Location of Primary Source of Pain) (Bilateral) (R>L)  Chronic knee pain (Left)  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)  Epidural Fibrosis  Musculoskeletal Pain  Neurogenic Pain  Neuropathic Pain  Chronic sacroiliac joint pain  (Bilateral) (R>L)  Chronic low back pain (Location of Primary Source of Pain) (Bilateral) (R>L)  Lumbar Spondylosis  Chronic neck pain (Right)  Cervical spondylosis (C7 intravertebral body cyst)  Chronic cervical radicular pain (Right)  Chronic lumbar radicular pain (Location of Secondary source of pain) (Bilateral) (L>R) (L5 Dermatome)  Osteoarthritis  Chronic Hip Pain  Chronic knee pain (Bilateral) (R>L)  Complication of Implanted Electronic Neurostimulator of Spinal Cord   The patient developed progressive cervical stenosis around the stimulator which required for daily needs to be taken out.   Long Term Current Use of Opiate Analgesic  Long Term Prescription Opiate Use  Opiate use (120 MME/Day)   Fentanyl patch 12.5  every 72 hours + oxycodone IR 20 mg every 6 hours (60 mg/day)   Opiate Dependence (Hcc)  Nausea Without Vomiting  Hypotestosteronism  Abnormal nerve conduction studies (06/20/16)   Test Date: 06/20/2016 Patient History: Patient is a 61 year-old male who presents with bilateral foot and leg numbness. Has back pain that radiates down into the legs. Patient is independent in activities of daily living and ambulation. Patient is retired. Past medical history is significant for thyroid problems. Surgical history is significant for lumbar spine surgery.  Exam: Patchy sensory loss is noted in the lower extremities in a length dependent fashion.  EMG & NCV Findings: Evaluation of the Left sural sensory and the Right sural sensory nerves showed prolonged distal peak latency (L4.2, R4.6 ms) and decreased conduction velocity (Calf-Lat Mall, L33, R30 m/s). All remaining nerves (as indicated in the following tables) were within normal limits. All F Wave latencies were within normal limits. The muscle scoring table definition stored in the current test does not match the sentence generator setup. The sentence could not be generated. EMG:  Side Muscle Nerve Root Ins Act Fibs Psw Amp Dur  Poly Recrt Int Fraser Din Comment  Right Gastroc Tibial S1-2 Nml Nml Nml Incr >42m 1+ Nml Nml  Right AntTibialis Dp Br Peron L4-5 Nml Nml Nml Incr >1105m1+ Nml Nml  Right Peroneus Long Sup Br Peron L5-S1 Nml Nml Nml Incr >1241m+ Nml Nml  Right VastusLat Femoral L2-4 Nml Nml Nml Nml Nml 0 Nml Nml  Right TensorFascLat SupGluteal L4-5, S1 Nml Nml Nml Nml Nml 0 Nml Nml  Right lumbar paraspinals upper: within normal limits Middle: within normal limits Lower: increased amplitude, increased duration , 50% polyphasic MUAPS. Impression: Abnormal study. There is electrodiagnostic evidence of mild generalized lower extremity polyneuropathy, and a superimposed moderate severity right lumbosacral radiculopathy.  Clinical Correlation: This test does not rule out a central etiology, and if clinically indicated a lumbar spine MRI would help clarify. [facility] (ZaGurney Maxin.D.) [id number]urLyonsC [id number].: [phone]); ([phone])   Numbness and Tingling of Both Legs  Controlled Type 2 Diabetes Mellitus Without Complication (Hcc)  Psoriatic Arthritis (Hcc)   Overview:  Methotrexate   Psoriasis With Arthropathy (Hcc)   Overview:  Overview:  Overview:  Methotrexate   Chest Pain  Acquired Hypothyroidism  Hypothyroidism  Airway Hyperreactivity  Narrowing of Intervertebral Disc Space  Gastric Ulcer  Acid Reflux  Hemorrhoid  Hepatitis  Lactose Intolerance  Osteopenia  Apnea, Sleep  Supraventricular Tachycardia (Hcc)  Encounter for Therapeutic Drug Level Monitoring  Gastric Catarrh   Overview:  chronic   Atrial Fibrillation (Hcc)  Abnormal Echocardiogram   Overview:  ABN STRESS ECHO REVEALING INFERIOR WALL ISCHEMIA   Awareness of Heartbeats  Apc (Atrial Premature Contractions)  Bp (High Blood Pressure)   Plan of Care  Pharmacotherapy (Medications Ordered): Meds ordered this encounter  Medications  . fentaNYL  (DURAGESIC - DOSED MCG/HR) 12 MCG/HR    Sig: Place 1 patch (12.5 mcg total) onto the skin every 3 (three) days.    Dispense:  10 patch    Refill:  0    Do not place this medication, or any other prescription from our practice, on "Automatic Refill". Patient may have prescription filled one day early if pharmacy is closed on scheduled refill date. Do not fill until: 06/18/2017 To last until: 07/18/2017    Order Specific Question:   Supervising Provider  AnswerMilinda Pointer [[id number]]  . fentaNYL (DURAGESIC - DOSED MCG/HR) 12 MCG/HR    Sig: Place 1 patch (12.5 mcg total) onto the skin every 3 (three) days.    Dispense:  10 patch    Refill:  0    Do not place this medication, or any other prescription from our practice, on "Automatic Refill". Patient may have prescription filled one day early if pharmacy is closed on scheduled refill date. Do not fill until: 07/18/2017 To last until: 08/17/2017    Order Specific Question:   Supervising Provider    Answer:   Milinda Pointer [phone]  . fentaNYL (DURAGESIC - DOSED MCG/HR) 12 MCG/HR    Sig: Place 1 patch (12.5 mcg total) onto the skin every 3 (three) days.    Dispense:  10 patch    Refill:  0    Do not place this medication, or any other prescription from our practice, on "Automatic Refill". Patient may have prescription filled one day early if pharmacy is closed on scheduled refill date. Do not fill until: 08/17/2017 To last until: 09/16/2017    Order Specific Question:   Supervising Provider    Answer:   Milinda Pointer [phone]  . Oxycodone HCl 20 MG TABS    Sig: Take 1 tablet (20 mg total) by mouth every 8 (eight) hours.    Dispense:  90 tablet    Refill:  0    Do not place this medication, or any other prescription from our practice, on "Automatic Refill". Patient may have prescription filled one day early if pharmacy is closed on scheduled refill date. Do not fill until: 06/18/2017 To last until: 07/18/2017    Order  Specific Question:   Supervising Provider    Answer:   Milinda Pointer [phone]  . Oxycodone HCl 20 MG TABS    Sig: Take 1 tablet (20 mg total) by mouth every 8 (eight) hours as needed.    Dispense:  90 tablet    Refill:  0    Do not place this medication, or any other prescription from our practice, on "Automatic Refill". Patient may have prescription filled one day early if pharmacy is closed on scheduled refill date. Do not fill until: 07/18/2017 To last until: 08/17/2017    Order Specific Question:   Supervising Provider    Answer:   Milinda Pointer [phone]  . Oxycodone HCl 20 MG TABS    Sig: Take 1 tablet (20 mg total) by mouth every 8 (eight) hours as needed.    Dispense:  90 tablet    Refill:  0    Do not place this medication, or any other prescription from our practice, on "Automatic Refill". Patient may have prescription filled one day early if pharmacy is closed on scheduled refill date. Do not fill until: 08/17/2017 To last until:09/16/2017    Order Specific Question:   Supervising Provider    Answer:   Milinda Pointer [phone]   New Prescriptions   No medications on file   Medications administered today: Mr. Berendt had no medications administered during this visit. Lab-work, procedure(s), and/or referral(s): No orders of the defined types were placed in this encounter.  Imaging and/or referral(s): None  Interventional therapies: Planned, scheduled, and/or pending:  Therapeutic right lumbar facet RFA, under fluoroscopic guidance and IV sedation. PT is not an option secondary to cardiac history   Considering:  Diagnostic Right-sided cervical epiduralsteroid injection.  Diagnostic Right-sided L4-5 lumbar epiduralsteroid injection.  Diagnostic bilateral lumbar facetblock. Possible  bilateral lumbar facet radiofrequencyablation. Diagnostic bilateral cervical facetblock.  Possible bilateral cervical facet radiofrequencyablation.   Palliative PRN  treatment(s):  For the arm pain and numbness, right-sided cervical epidural steroid injection For the lower extremity pain, right-sided L4-5 lumbar epidural steroid injection For the low back pain, diagnostic bilateral lumbar facet block For the neck pain, diagnostic bilateral cervical facet block   Provider-requested follow-up: Return in about 3 months (around 08/30/2017) for MedMgmt.  Future Appointments Date Time Provider Department Center  07/25/2017 10:15 AM Delano Metz, MD [facility] None  08/29/2017 9:15 AM Barbette Merino, NP [facility] None   Primary Care Physician: Dorothey Baseman, MD Location: [facility] Outpatient Pain Management Facility Note by: Barbette Merino NP Date: 05/30/2017; Time: 1:52 PM  Pain Score Disclaimer: We use the NRS-11 scale. This is a self-reported, subjective measurement of pain severity with only modest accuracy. It is used primarily to identify changes within a particular patient. It must be understood that outpatient pain scales are significantly less accurate that those used for research, where they can be applied under ideal controlled circumstances with minimal exposure to variables. In reality, the score is likely to be a combination of pain intensity and pain affect, where pain affect describes the degree of emotional arousal or changes in action readiness caused by the sensory experience of pain. Factors such as social and work situation, setting, emotional state, anxiety levels, expectation, and prior pain experience may influence pain perception and show large inter-individual differences that may also be affected by time variables.  Patient instructions provided during this appointment: Patient Instructions   ____________________________________________________________________________________________  Medication Rules  Applies to: All patients receiving prescriptions (written or electronic).  Pharmacy of record: Pharmacy where electronic  prescriptions will be sent. If written prescriptions are taken to a different pharmacy, please inform the nursing staff. The pharmacy listed in the electronic medical record should be the one where you would like electronic prescriptions to be sent.  Prescription refills: Only during scheduled appointments. Applies to both, written and electronic prescriptions.  NOTE: The following applies primarily to controlled substances (Opioid* Pain Medications).   Patient's responsibilities: 1. Pain Pills: Bring all pain pills to every appointment (except for procedure appointments). 2. Pill Bottles: Bring pills in original pharmacy bottle. Always bring newest bottle. Bring bottle, even if empty. 3. Medication refills: You are responsible for knowing and keeping track of what medications you need refilled. The day before your appointment, write a list of all prescriptions that need to be refilled. Bring that list to your appointment and give it to the admitting nurse. Prescriptions will be written only during appointments. If you forget a medication, it will not be "Called in", "Faxed", or "electronically sent". You will need to get another appointment to get these prescribed. 4. Prescription Accuracy: You are responsible for carefully inspecting your prescriptions before leaving our office. Have the discharge nurse carefully go over each prescription with you, before taking them home. Make sure that your name is accurately spelled, that your address is correct. Check the name and dose of your medication to make sure it is accurate. Check the number of pills, and the written instructions to make sure they are clear and accurate. Make sure that you are given enough medication to last until your next medication refill appointment. 5. Taking Medication: Take medication as prescribed. Never take more pills than instructed. Never take medication more frequently than prescribed. Taking less pills or less frequently is  permitted and encouraged, when it comes to  controlled substances (written prescriptions).  6. Inform other Doctors: Always inform, all of your healthcare providers, of all the medications you take. 7. Pain Medication from other Providers: You are not allowed to accept any additional pain medication from any other Doctor or Healthcare provider. There are two exceptions to this rule. (see below) In the event that you require additional pain medication, you are responsible for notifying us, as stated below. 8. Medication Agreement: You are responsible for carefully reading and following our Medication Agreement. This must be signed before receiving any prescriptions from our practice. Safely store a copy of your signed Agreement. Violations to the Agreement will result in no further prescriptions. (Additional copies of our Medication Agreement are available upon request.) 9. Laws, Rules, & Regulations: All patients are expected to follow all Federal and [employer], [employer], Rules, [employer]. Ignorance of the Laws does not constitute a valid excuse. The use of any illegal substances is prohibited. 10. Adopted CDC guidelines & recommendations: Target dosing levels will be at or below 60 MME/day. Use of benzodiazepines** is not recommended.  Exceptions: There are only two exceptions to the rule of not receiving pain medications from other Healthcare Providers. 1. Exception #1 (Emergencies): In the event of an emergency (i.e.: accident requiring emergency care), you are allowed to receive additional pain medication. However, you are responsible for: As soon as you are able, call our office [phone], at any time of the day or night, and leave a message stating your name, the date and nature of the emergency, and the name and dose of the medication prescribed. In the event that your call is answered by a member of our staff, make sure to document and save the date, time, and the name of the person that  took your information.  2. Exception #2 (Planned Surgery): In the event that you are scheduled by another doctor or dentist to have any type of surgery or procedure, you are allowed (for a period no longer than 30 days), to receive additional pain medication, for the acute post-op pain. However, in this case, you are responsible for picking up a copy of our "Post-op Pain Management for Surgeons" handout, and giving it to your surgeon or dentist. This document is available at our office, and does not require an appointment to obtain it. Simply go to our office during business hours (Monday-Thursday from 8:00 AM to 4:00 PM) (Friday 8:00 AM to 12:00 Noon) or if you have a scheduled appointment with [place], prior to your surgery, and ask for it by name. In addition, you will need to provide [place] with your name, name of your surgeon, type of surgery, and date of procedure or surgery.  *Opioid medications include: morphine, codeine, oxycodone, oxymorphone, hydrocodone, hydromorphone, meperidine, tramadol, tapentadol, buprenorphine, fentanyl, methadone. **Benzodiazepine medications include: diazepam (Valium), alprazolam (Xanax), clonazepam (Klonopine), lorazepam (Ativan), clorazepate (Tranxene), chlordiazepoxide (Librium), estazolam (Prosom), oxazepam (Serax), temazepam (Restoril), triazolam (Halcion)  You were given 3 prescriptions each for Fentanyl patch and Oxycodone today.  ____________________________________________________________________________________________

## 2017-06-06 ENCOUNTER — Ambulatory Visit: Payer: PPO | Admitting: Pain Medicine

## 2017-07-05 ENCOUNTER — Other Ambulatory Visit: Payer: Self-pay | Admitting: Gastroenterology

## 2017-07-05 DIAGNOSIS — Z8639 Personal history of other endocrine, nutritional and metabolic disease: Secondary | ICD-10-CM | POA: Diagnosis not present

## 2017-07-05 DIAGNOSIS — R1084 Generalized abdominal pain: Secondary | ICD-10-CM

## 2017-07-05 DIAGNOSIS — K224 Dyskinesia of esophagus: Secondary | ICD-10-CM | POA: Diagnosis not present

## 2017-07-05 DIAGNOSIS — Z1211 Encounter for screening for malignant neoplasm of colon: Secondary | ICD-10-CM | POA: Diagnosis not present

## 2017-07-10 DIAGNOSIS — M19019 Primary osteoarthritis, unspecified shoulder: Secondary | ICD-10-CM | POA: Diagnosis not present

## 2017-07-10 DIAGNOSIS — M7542 Impingement syndrome of left shoulder: Secondary | ICD-10-CM | POA: Diagnosis not present

## 2017-07-10 DIAGNOSIS — M7541 Impingement syndrome of right shoulder: Secondary | ICD-10-CM | POA: Diagnosis not present

## 2017-07-18 ENCOUNTER — Ambulatory Visit
Admission: RE | Admit: 2017-07-18 | Discharge: 2017-07-18 | Disposition: A | Discharge status: Home / Self Care | Payer: PPO | Source: Ambulatory Visit | Attending: Pain Medicine | Admitting: Pain Medicine

## 2017-07-18 ENCOUNTER — Encounter: Payer: Self-pay | Admitting: Pain Medicine

## 2017-07-18 ENCOUNTER — Ambulatory Visit (HOSPITAL_BASED_OUTPATIENT_CLINIC_OR_DEPARTMENT_OTHER): Payer: PPO | Admitting: Pain Medicine

## 2017-07-18 VITALS — BP 109/67 | HR 64 | Temp 97.3°F | Resp 14 | Ht 72.0 in | Wt 255.0 lb

## 2017-07-18 DIAGNOSIS — M545 Low back pain: Secondary | ICD-10-CM | POA: Diagnosis not present

## 2017-07-18 DIAGNOSIS — Z885 Allergy status to narcotic agent status: Secondary | ICD-10-CM | POA: Insufficient documentation

## 2017-07-18 DIAGNOSIS — G8929 Other chronic pain: Secondary | ICD-10-CM | POA: Insufficient documentation

## 2017-07-18 DIAGNOSIS — G8918 Other acute postprocedural pain: Secondary | ICD-10-CM

## 2017-07-18 DIAGNOSIS — M47816 Spondylosis without myelopathy or radiculopathy, lumbar region: Secondary | ICD-10-CM

## 2017-07-18 DIAGNOSIS — M4696 Unspecified inflammatory spondylopathy, lumbar region: Secondary | ICD-10-CM | POA: Diagnosis not present

## 2017-07-18 DIAGNOSIS — Z9689 Presence of other specified functional implants: Secondary | ICD-10-CM | POA: Insufficient documentation

## 2017-07-18 DIAGNOSIS — Z880 Allergy status to penicillin: Secondary | ICD-10-CM | POA: Insufficient documentation

## 2017-07-18 DIAGNOSIS — Z9889 Other specified postprocedural states: Secondary | ICD-10-CM | POA: Diagnosis not present

## 2017-07-18 DIAGNOSIS — Z888 Allergy status to other drugs, medicaments and biological substances status: Secondary | ICD-10-CM | POA: Diagnosis not present

## 2017-07-18 MED ORDER — MIDAZOLAM HCL 5 MG/5ML IJ SOLN
INTRAMUSCULAR | Status: AC
  Filled 2017-07-18: qty 5

## 2017-07-18 MED ORDER — FENTANYL CITRATE (PF) 100 MCG/2ML IJ SOLN
25.0000 ug | INTRAMUSCULAR | Status: DC | PRN
  Administered 2017-07-18: 100 ug via INTRAVENOUS

## 2017-07-18 MED ORDER — MIDAZOLAM HCL 5 MG/5ML IJ SOLN
1.0000 mg | INTRAMUSCULAR | Status: DC | PRN
  Administered 2017-07-18: 5 mg via INTRAVENOUS

## 2017-07-18 MED ORDER — FENTANYL CITRATE (PF) 100 MCG/2ML IJ SOLN
INTRAMUSCULAR | Status: AC
  Filled 2017-07-18: qty 2

## 2017-07-18 MED ORDER — LIDOCAINE HCL 2 % IJ SOLN: 10.0000 mL | Freq: Once | INTRAMUSCULAR | Status: DC

## 2017-07-18 MED ORDER — TRIAMCINOLONE ACETONIDE 40 MG/ML IJ SUSP
INTRAMUSCULAR | Status: AC
  Filled 2017-07-18: qty 1

## 2017-07-18 MED ORDER — ROPIVACAINE HCL 2 MG/ML IJ SOLN: 9.0000 mL | Freq: Once | INTRAMUSCULAR | Status: DC

## 2017-07-18 MED ORDER — OXYCODONE-ACETAMINOPHEN 5-325 MG PO TABS: 1.0000 | ORAL_TABLET | Freq: Four times a day (QID) | ORAL | 0 refills | Status: DC | PRN

## 2017-07-18 MED ORDER — LIDOCAINE HCL (PF) 2 % IJ SOLN
INTRAMUSCULAR | Status: AC
  Filled 2017-07-18: qty 10

## 2017-07-18 MED ORDER — ROPIVACAINE HCL 2 MG/ML IJ SOLN
INTRAMUSCULAR | Status: AC
  Filled 2017-07-18: qty 10

## 2017-07-18 MED ORDER — TRIAMCINOLONE ACETONIDE 40 MG/ML IJ SUSP: 40.0000 mg | Freq: Once | INTRAMUSCULAR | Status: DC

## 2017-07-18 MED ORDER — LACTATED RINGERS IV SOLN
1000.0000 mL | Freq: Once | INTRAVENOUS | Status: AC
  Administered 2017-07-18: 1000 mL via INTRAVENOUS

## 2017-07-18 NOTE — Progress Notes (Signed)
Patient's Name: James [name]  MRN: [id number]  Referring Provider: Milinda Pointer, MD  DOB: 18-May-1956  PCP: [name], MD  DOS: 07/18/2017  Note by: Gaspar Cola, MD  Service setting: Ambulatory outpatient  Specialty: Interventional Pain Management  Patient type: Established  Location: [facility] (AMB) Pain Management Facility  Visit type: Interventional Procedure   Primary Reason for Visit: Interventional Pain Management Treatment. CC: Back Pain (low)  Procedure:  Anesthesia, Analgesia, Anxiolysis:  Type: Therapeutic Medial Branch Facet Radiofrequency Ablation Region: Lumbar Level: L2, L3, L4, L5, & S1 Medial Branch Level(s) Laterality: Left-Sided (initially the patient came in for a right sided lumbar facet radiofrequency, however his worst pain is on the left side and therefore we will start with the left. We will bring him back in 2 weeks to do the right side area)  Type: Local Anesthesia with Moderate (Conscious) Sedation Local Anesthetic: Lidocaine 1% Route: Intravenous (IV) IV Access: Secured Sedation: Meaningful verbal contact was maintained at all times during the procedure  Indication(s): Analgesia and Anxiety  Indications: 1. Lumbar facet syndrome (Location of Primary Source of Pain) (Bilateral) (R>L)   2. Lumbar spondylosis   3. Chronic low back pain (Location of Primary Source of Pain) (Bilateral) (R>L)   4. Acute postoperative pain    James [name] has either failed to respond, was unable to tolerate, or simply did not get enough benefit from other more conservative therapies including, but not limited to: 1. Over-the-counter medications 2. Anti-inflammatory medications 3. Muscle relaxants 4. Membrane stabilizers 5. Opioids 6. Physical therapy 7. Modalities (Heat, ice, etc.) 8. Invasive techniques such as nerve blocks. James [name] has attained more than 50% relief of the pain from a series of diagnostic injections conducted in separate occasions.  Pain  Score: Pre-procedure: 4 /10 Post-procedure: 6 /10  Pre-op Assessment:  James [name] is a 61 y.o. (year old), male patient, seen today for interventional treatment. He  has a past surgical history that includes Spine surgery; Eye surgery; Fracture surgery (Right); Fracture surgery (Bilateral); Fracture surgery (Bilateral); Joint replacement (Right); Spinal cord stimulator implant; Spinal cord stimulator removal; Patella arthroplasty; Shoulder arthroscopy (Bilateral); Esophagogastroduodenoscopy (egd) with propofol (N/A, 01/20/2016); and Colonoscopy (2009). James [name] has a current medication list which includes the following prescription(s): albuterol, azelastine, budesonide-formoterol, fentanyl, fentanyl, fentanyl, loratadine, methotrexate, metoprolol tartrate, oxycodone hcl, oxycodone hcl, oxycodone hcl, polyethylene glycol powder, promethazine, sucralfate, temazepam, tizanidine, metoprolol tartrate, and oxycodone-acetaminophen, and the following Facility-Administered Medications: fentanyl, lidocaine, midazolam, ropivacaine (pf) 2 mg/ml (0.2%), and triamcinolone acetonide. His primarily concern today is the Back Pain (low)  Initial Vital Signs: Blood pressure 108/70, pulse 64, temperature 98.4 F (36.9 C), resp. rate 18, height 6' (1.829 m), weight 255 lb (115.7 kg), SpO2 99 %. BMI: Estimated body mass index is 34.58 kg/m as calculated from the following:   Height as of this encounter: 6' (1.829 m).   Weight as of this encounter: 255 lb (115.7 kg).  Risk Assessment: Allergies: Reviewed. He is allergic to penicillin g; penicillins; lactose; nsaids; prednisone; procaine; sulfasalazine; talwin [pentazocine]; codeine; and sulfa antibiotics.  Allergy Precautions: None required Coagulopathies: Reviewed. None identified.  Blood-thinner therapy: None at this time Active Infection(s): Reviewed. None identified. James [name] is afebrile  Site Confirmation: James [name] was asked to confirm the procedure  and laterality before marking the site Procedure checklist: Completed Consent: Before the procedure and under the influence of no sedative(s), amnesic(s), or anxiolytics, the patient was informed of the treatment options, risks and possible complications. To fulfill our  ethical and legal obligations, as recommended by the American Medical Association's Code of Ethics, I have informed the patient of my clinical impression; the nature and purpose of the treatment or procedure; the risks, benefits, and possible complications of the intervention; the alternatives, including doing nothing; the risk(s) and benefit(s) of the alternative treatment(s) or procedure(s); and the risk(s) and benefit(s) of doing nothing. The patient was provided information about the general risks and possible complications associated with the procedure. These may include, but are not limited to: failure to achieve desired goals, infection, bleeding, organ or nerve damage, allergic reactions, paralysis, and death. In addition, the patient was informed of those risks and complications associated to Spine-related procedures, such as failure to decrease pain; infection (i.e.: Meningitis, epidural or intraspinal abscess); bleeding (i.e.: epidural hematoma, subarachnoid hemorrhage, or any other type of intraspinal or peri-dural bleeding); organ or nerve damage (i.e.: Any type of peripheral nerve, nerve root, or spinal cord injury) with subsequent damage to sensory, motor, and/or autonomic systems, resulting in permanent pain, numbness, and/or weakness of one or several areas of the body; allergic reactions; (i.e.: anaphylactic reaction); and/or death. Furthermore, the patient was informed of those risks and complications associated with the medications. These include, but are not limited to: allergic reactions (i.e.: anaphylactic or anaphylactoid reaction(s)); adrenal axis suppression; blood sugar elevation that in diabetics may result in  ketoacidosis or comma; water retention that in patients with history of congestive heart failure may result in shortness of breath, pulmonary edema, and decompensation with resultant heart failure; weight gain; swelling or edema; medication-induced neural toxicity; particulate matter embolism and blood vessel occlusion with resultant organ, and/or nervous system infarction; and/or aseptic necrosis of one or more joints. Finally, the patient was informed that Medicine is not an exact science; therefore, there is also the possibility of unforeseen or unpredictable risks and/or possible complications that may result in a catastrophic outcome. The patient indicated having understood very clearly. We have given the patient no guarantees and we have made no promises. Enough time was given to the patient to ask questions, all of which were answered to the patient's satisfaction. James [name] has indicated that he wanted to continue with the procedure. Attestation: I, the ordering provider, attest that I have discussed with the patient the benefits, risks, side-effects, alternatives, likelihood of achieving goals, and potential problems during recovery for the procedure that I have provided informed consent. Date: 07/18/2017; Time: 10:39 AM  Pre-Procedure Preparation:  Monitoring: As per clinic protocol. Respiration, ETCO2, SpO2, BP, heart rate and rhythm monitor placed and checked for adequate function Safety Precautions: Patient was assessed for positional comfort and pressure points before starting the procedure. Time-out: I initiated and conducted the "Time-out" before starting the procedure, as per protocol. The patient was asked to participate by confirming the accuracy of the "Time Out" information. Verification of the correct person, site, and procedure were performed and confirmed by me, the nursing staff, and the patient. "Time-out" conducted as per Joint Commission's Universal Protocol  (UP.01.01.01). "Time-out" Date & Time: 07/18/2017; 1203 hrs.  Description of Procedure Process:   Position: Prone Target Area: For Lumbar Facet blocks, the target is the groove formed by the junction of the transverse process and superior articular process. For the L5 dorsal ramus, the target is the notch between superior articular process and sacral ala. For the S1 dorsal ramus, the target is the superior and lateral edge of the posterior S1 Sacral foramen. Approach: Paraspinal approach. Area Prepped: Entire Posterior Lumbosacral Region  Prepping solution: Hibiclens (4.0% Chlorhexidine gluconate solution) Safety Precautions: Aspiration looking for blood return was conducted prior to all injections. At no point did we inject any substances, as a needle was being advanced. No attempts were made at seeking any paresthesias. Safe injection practices and needle disposal techniques used. Medications properly checked for expiration dates. SDV (single dose vial) medications used. Description of the Procedure: Protocol guidelines were followed. The patient was placed in position over the fluoroscopy table. The target area was identified and the area prepped in the usual manner. Skin desensitized using vapocoolant spray. Skin & deeper tissues infiltrated with local anesthetic. Appropriate amount of time allowed to pass for local anesthetics to take effect. Radiofrequency needles were introduced to the area of the medial branch at the junction of the superior articular process and transverse process using fluoroscopy. Using the [employer], sensory stimulation using 50 Hz was used to locate & identify the nerve, making sure that the needle was positioned such that there was no sensory stimulation below 0.3 V or above 0.7 V. Stimulation using 2 Hz was used to evaluate the motor component. Care was taken not to lesion any nerves that demonstrated motor stimulation of the lower extremities at an  output of less than 2.5 times that of the sensory threshold, or a maximum of 2.0 V. Once satisfactory placement of the needles was achieved, the above solution was slowly injected after negative aspiration. After waiting for at least 2 minutes, the ablation was performed at 80 degrees C for 60 seconds.The needles were then removed and the area cleansed, making sure to leave some of the prepping solution back to take advantage of its long term bactericidal properties. Intra-operative Compliance: Compliant  Illustration of the posterior view of the lumbar spine and the posterior neural structures. Laminae of L2 through S1 are labeled. DPRL5, dorsal primary ramus of L5; DPRS1, dorsal primary ramus of S1; DPR3, dorsal primary ramus of L3; FJ, facet (zygapophyseal) joint L3-L4; I, inferior articular process of L4; LB1, lateral branch of dorsal primary ramus of L1; IAB, inferior articular branches from L3 medial branch (supplies L4-L5 facet joint); IBP, intermediate branch plexus; MB3, medial branch of dorsal primary ramus of L3; NR3, third lumbar nerve root; S, superior articular process of L5; SAB, superior articular branches from L4 (supplies L4-5 facet joint also); TP3, transverse process of L3.  Vitals:   07/18/17 1235 07/18/17 1240 07/18/17 1250 07/18/17 1300  BP: 110/68 113/66 126/74 109/67  Pulse: 68 75 68 64  Resp: 12 15 14 14   Temp:   (!) 97.3 F (36.3 C)   SpO2: 98% 100% 99% 94%  Weight:      Height:        Start Time: 1203 hrs. End Time: 1239 hrs. Materials & Medications:  Needle(s) Type: Teflon-coated, curved tip, Radiofrequency needle(s) Gauge: 22G Length: 10cm Medication(s): We administered lactated ringers, midazolam, and fentaNYL. Please see chart orders for dosing details.  Imaging Guidance (Spinal):  Type of Imaging Technique: Fluoroscopy Guidance (Spinal) Indication(s): Assistance in needle guidance and placement for procedures requiring needle placement in or near specific  anatomical locations not easily accessible without such assistance. Exposure Time: Please see nurses notes. Contrast: None used. Fluoroscopic Guidance: I was personally present during the use of fluoroscopy. "Tunnel Vision Technique" used to obtain the best possible view of the target area. Parallax error corrected before commencing the procedure. "Direction-depth-direction" technique used to introduce the needle under continuous pulsed fluoroscopy. Once target was reached, antero-posterior, oblique,  and lateral fluoroscopic projection used confirm needle placement in all planes. Images permanently stored in EMR. Interpretation: No contrast injected. I personally interpreted the imaging intraoperatively. Adequate needle placement confirmed in multiple planes. Permanent images saved into the patient's record.  Antibiotic Prophylaxis:  Indication(s): None identified Antibiotic given: None  Post-operative Assessment:  EBL: None Complications: No immediate post-treatment complications observed by team, or reported by patient. Note: The patient tolerated the entire procedure well. A repeat set of vitals were taken after the procedure and the patient was kept under observation following institutional policy, for this type of procedure. Post-procedural neurological assessment was performed, showing return to baseline, prior to discharge. The patient was provided with post-procedure discharge instructions, including a section on how to identify potential problems. Should any problems arise concerning this procedure, the patient was given instructions to immediately contact us, at any time, without hesitation. In any case, we plan to contact the patient by telephone for a follow-up status report regarding this interventional procedure. Comments:  No additional relevant information.  Plan of Care  Disposition: Discharge home  Discharge Date & Time: 07/18/2017; 1315 hrs.   Physician-requested Follow-up:   Return in about 2 weeks (around 08/01/2017) for RFA procedure under fluoro and sedation: (L) L-FCT RFA.  Future Appointments Date Time Provider [place]  08/28/2017 8:15 AM Milinda Pointer, MD [facility] None  08/29/2017 9:15 AM Vevelyn Francois, NP [facility] None    Imaging Orders     DG C-Arm 1-60 Min-No Report Procedure Orders    No procedure(s) ordered today    Medications ordered for procedure: Meds ordered this encounter  Medications  . lactated ringers infusion 1,000 mL  . midazolam (VERSED) 5 MG/5ML injection 1-2 mg    Make sure Flumazenil is available in the pyxis when using this medication. If oversedation occurs, administer 0.2 mg IV over 15 sec. If after 45 sec no response, administer 0.2 mg again over 1 min; may repeat at 1 min intervals; not to exceed 4 doses (1 mg)  . fentaNYL (SUBLIMAZE) injection 25-50 mcg    Make sure Narcan is available in the pyxis when using this medication. In the event of respiratory depression (RR< 8/min): Titrate NARCAN (naloxone) in increments of 0.1 to 0.2 mg IV at 2-3 minute intervals, until desired degree of reversal.  . lidocaine (XYLOCAINE) 2 % (with pres) injection 200 mg  . triamcinolone acetonide (KENALOG-40) injection 40 mg  . ropivacaine (PF) 2 mg/mL (0.2%) (NAROPIN) injection 9 mL  . oxyCODONE-acetaminophen (PERCOCET) 5-325 MG tablet    Sig: Take 1-2 tablets by mouth every 6 (six) hours as needed for severe pain.    Dispense:  80 tablet    Refill:  0    For acute post-operative pain. Not to be refilled. To last until:   Medications administered: We administered lactated ringers, midazolam, and fentaNYL.  See the medical record for exact dosing, route, and time of administration.  New Prescriptions   OXYCODONE-ACETAMINOPHEN (PERCOCET) 5-325 MG TABLET    Take 1-2 tablets by mouth every 6 (six) hours as needed for severe pain.   Primary Care Physician: [name], MD Location: [facility]  Facility Note by: Gaspar Cola, MD Date: 07/18/2017; Time: 1:30 PM  Disclaimer:  Medicine is not an Chief Strategy Officer. The only guarantee in medicine is that nothing is guaranteed. It is important to note that the decision to proceed with this intervention was based on the information collected from the patient. The Data and conclusions  were drawn from the patient's questionnaire, the interview, and the physical examination. Because the information was provided in large part by the patient, it cannot be guaranteed that it has not been purposely or unconsciously manipulated. Every effort has been made to obtain as much relevant data as possible for this evaluation. It is important to note that the conclusions that lead to this procedure are derived in large part from the available data. Always take into account that the treatment will also be dependent on availability of resources and existing treatment guidelines, considered by other Pain Management Practitioners as being common knowledge and practice, at the time of the intervention. For Medico-Legal purposes, it is also important to point out that variation in procedural techniques and pharmacological choices are the acceptable norm. The indications, contraindications, technique, and results of the above procedure should only be interpreted and judged by a Board-Certified Interventional Pain Specialist with extensive familiarity and expertise in the same exact procedure and technique.

## 2017-07-18 NOTE — Progress Notes (Signed)
Safety precautions to be maintained throughout the outpatient stay will include: orient to surroundings, keep bed in low position, maintain call bell within reach at all times, provide assistance with transfer out of bed and ambulation.  

## 2017-07-18 NOTE — Patient Instructions (Addendum)
____________________________________________________________________________________________  Post-Procedure instructions Instructions:  Apply ice: Fill a plastic sandwich bag with crushed ice. Cover it with a small towel and apply to injection site. Apply for 15 minutes then remove x 15 minutes. Repeat sequence on day of procedure, until you go to bed. The purpose is to minimize swelling and discomfort after procedure.  Apply heat: Apply heat to procedure site starting the day following the procedure. The purpose is to treat any soreness and discomfort from the procedure.  Food intake: Start with clear liquids (like water) and advance to regular food, as tolerated.   Physical activities: Keep activities to a minimum for the first 8 hours after the procedure.   Driving: If you have received any sedation, you are not allowed to drive for 24 hours after your procedure.  Blood thinner: Restart your blood thinner 6 hours after your procedure. (Only for those taking blood thinners)  Insulin: As soon as you can eat, you may resume your normal dosing schedule. (Only for those taking insulin)  Infection prevention: Keep procedure site clean and dry.  Post-procedure Pain Diary: Extremely important that this be done correctly and accurately. Recorded information will be used to determine the next step in treatment.  Pain evaluated is that of treated area only. Do not include pain from an untreated area.  Complete every hour, on the hour, for the initial 8 hours. Set an alarm to help you do this part accurately.  Do not go to sleep and have it completed later. It will not be accurate.  Follow-up appointment: Keep your follow-up appointment after the procedure. Usually 2 weeks for most procedures. (6 weeks in the case of radiofrequency.) Bring you pain diary.  Expect:  From numbing medicine (AKA: Local Anesthetics): Numbness or decrease in pain.  Onset: Full effect within 15 minutes of  injected.  Duration: It will depend on the type of local anesthetic used. On the average, 1 to 8 hours.   From steroids: Decrease in swelling or inflammation. Once inflammation is improved, relief of the pain will follow.  Onset of benefits: Depends on the amount of swelling present. The more swelling, the longer it will take for the benefits to be seen. In some cases, up to 10 days.  Duration: Steroids will stay in the system x 2 weeks. Duration of benefits will depend on multiple posibilities including persistent irritating factors.  From procedure: Some discomfort is to be expected once the numbing medicine wears off. This should be minimal if ice and heat are applied as instructed. Call if:  You experience numbness and weakness that gets worse with time, as opposed to wearing off.  New onset bowel or bladder incontinence. (Spinal procedures only)  Emergency Numbers:  Durning business hours (Monday - Thursday, 8:00 AM - 4:00 PM) (Friday, 9:00 AM - 12:00 Noon): [phone]  After hours: [phone] ____________________________________________________________________________________________  Post RF Discharge Instructions  You have just completed Radiofrequency Neurotomy.  The following instructions will provide you with information and guidelines for self-care upon discharge.  If at any time you have questions or concerns please call your physician.  General Instructions:  Be alert for signs of possible infection: redness, swelling, heat, red streaks, elevated temperature, fever.  Please notify your doctor immediately if you have unusual bleeding, trouble breathing, or loss of the ability to control your bowel or bladder.  What to expect:  Most procedures involve the use of local anesthetics (numbing medicine), steroids (anti-inflammatory medicines) and possibly sedation (relaxation or nerve  medicine).  Sedation may affect your memory, not allowing you to remember the  procedure, or the instructions that we give you after it.  Because of this, you doctor may want to avoid providing you important information after the procedure, since you may not remember.  The doctor will be more than happy to go over the information upon your return.  Local anesthetics, on the other hand, may cause temporary numbness and weakness of the legs or arms, depending on the location of the block.  This numbness/weaknes may last 4-6 hours ( the duration of the local anesthetic).  During this period of numbness, you must be more careful than usual to prevent any injuries to th extremity.  Steroids will begin to work immediately after injected, but on the average, it will take 6-10 days for the swelling to come down to the point where you will be able to tell a difference in terms of the pain.  In summary, you should expect for your pain to get better within 15-20 minutes after the procedure.  This relieve or numbness should last 4-6 hours, after which, it will wear off.  Once it wears off, you may experience more pain than usual for 4-6 days, or until the steroids "kick in".  This discomfort is due to the procedure itself.  To minimize this, we recommend applying ice (fill a plastic sandwich bag with ice and wrap it on a towel to prevent frostbite) to the area, 15 minutes on and 15 minutes off, the day of the procedure.  This will minimize any swelling.  Starting the next day, you should then start with heat (moist or dry, it does not matter).  Heat therapy should continue until the pain improves (4-6 days).  Be careful not to burn yourself.  In the case of Radiofrequency procedures, you should expect more pain than usual for 5 to 6 weeks after the procedure.  This is how long it takes the burned tissue to heal.  We cannot assess any definite results on the success of the procedure until this recovery period has elapsed.  Post-Procedure Care:  You will want to be careful in moving about.  Muscle  spasms in the area of the injection may occur.  Use ice or heat to the area is often helpful.   Occasionally, a spinal headache can develop.  This is different from a normal headache in the sense that it will not go away on it's own, despite normal measures.  If you develop a headache that does not seem to respond to conservative therapy, please let your physician know.  This can be treated with an epidural blood patch.   You may experience some numbness or redness,m however it should be short lived.  If persistent numbness occurs, contact your physician. (Persistent numbness would be defined as lasting more than 4-6 hours).  Use care in moving the effected arm or leg to avoid further injury.  You may be given a sling or crutches to use temporarily.  Some discoloration may be present in your arms or leg for 24-48 hours.  If you experience shortness of breath or if your lips or fingers develop a dusky or "blue" appearance, contact your physician or go to the emergency room.  Diet  No eating limitations, unless stipulated above.  Nevertheless, if you have had sedation, you may experience some nausea.  In this case, it may be wise to wait at least two hours prior to resuming regular diet.  Comfort  You may experience a temporary increase in pain.  You will also experience tenderness at the site of injection, which may be accompanied with muscle spasms.  Use of cold or heat is usually helpful.  Take your prescription as directed.  Always exercise caution when taking pain pills.  Activity:  For the first 24 hours after the procedure, do not drive a motor vehicle,  Operate heavy machinery or power tools or handle any weapons.  Consider walking with the use of an assistive device or accompanied by an adult for the next 24 hours.  Do not drink alcoholic beverages including beer.  Do not make any important decisions or sign any legal documents. Go home and rest today.  Resume activities tomorrow, as tolerated.   Use caution in moving about as you may experience mild leg weakness.  Use caution in cooking, use of household electrical appliances and climbing steps.  Medications  May resume pre-procedure medications.  Do not take any drugs, other than what has been prescribed to you.   Other   GENERAL RISKS AND COMPLICATIONS  What are the risk, side effects and possible complications? Generally speaking, most procedures are safe.  However, with any procedure there are risks, side effects, and the possibility of complications.  The risks and complications are dependent upon the sites that are lesioned, or the type of nerve block to be performed.  The closer the procedure is to the spine, the more serious the risks are.  Great care is taken when placing the radio frequency needles, block needles or lesioning probes, but sometimes complications can occur. 1. Infection: Any time there is an injection through the skin, there is a risk of infection.  This is why sterile conditions are used for these blocks.  There are four possible types of infection. 1. Localized skin infection. 2. Central Nervous System Infection-This can be in the form of Meningitis, which can be deadly. 3. Epidural Infections-This can be in the form of an epidural abscess, which can cause pressure inside of the spine, causing compression of the spinal cord with subsequent paralysis. This would require an emergency surgery to decompress, and there are no guarantees that the patient would recover from the paralysis. 4. Discitis-This is an infection of the intervertebral discs.  It occurs in about 1% of discography procedures.  It is difficult to treat and it may lead to surgery.        2. Pain: the needles have to go through skin and soft tissues, will cause soreness.       3. Damage to internal structures:  The nerves to be lesioned may be near blood vessels or    other nerves which can be potentially damaged.       4. Bleeding: Bleeding is  more common if the patient is taking blood thinners such as  aspirin, Coumadin, Ticiid, Plavix, etc., or if he/she have some genetic predisposition  such as hemophilia. Bleeding into the spinal canal can cause compression of the spinal  cord with subsequent paralysis.  This would require an emergency surgery to  decompress and there are no guarantees that the patient would recover from the  paralysis.       5. Pneumothorax:  Puncturing of a lung is a possibility, every time a needle is introduced in  the area of the chest or upper back.  Pneumothorax refers to free air around the  collapsed lung(s), inside of the thoracic cavity (chest cavity).  Another two possible  complications  related to a similar event would include: Hemothorax and Chylothorax.   These are variations of the Pneumothorax, where instead of air around the collapsed  lung(s), you may have blood or chyle, respectively.       6. Spinal headaches: They may occur with any procedures in the area of the spine.       7. Persistent CSF (Cerebro-Spinal Fluid) leakage: This is a rare problem, but may occur  with prolonged intrathecal or epidural catheters either due to the formation of a fistulous  track or a dural tear.       8. Nerve damage: By working so close to the spinal cord, there is always a possibility of  nerve damage, which could be as serious as a permanent spinal cord injury with  paralysis.       9. Death:  Although rare, severe deadly allergic reactions known as "Anaphylactic  reaction" can occur to any of the medications used.      10. Worsening of the symptoms:  We can always make thing worse.  What are the chances of something like this happening? Chances of any of this occuring are extremely low.  By statistics, you have more of a chance of getting killed in a motor vehicle accident: while driving to the hospital than any of the above occurring .  Nevertheless, you should be aware that they are possibilities.  In general, it is  similar to taking a shower.  Everybody knows that you can slip, hit your head and get killed.  Does that mean that you should not shower again?  Nevertheless always keep in mind that statistics do not mean anything if you happen to be on the wrong side of them.  Even if a procedure has a 1 (one) in a 1,000,000 (million) chance of going wrong, it you happen to be that one..Also, keep in mind that by statistics, you have more of a chance of having something go wrong when taking medications.  Who should not have this procedure? If you are on a blood thinning medication (e.g. Coumadin, Plavix, see list of "Blood Thinners"), or if you have an active infection going on, you should not have the procedure.  If you are taking any blood thinners, please inform your physician.  How should I prepare for this procedure?  Do not eat or drink anything at least six hours prior to the procedure.  Bring a driver with you .  It cannot be a taxi.  Come accompanied by an adult that can drive you back, and that is strong enough to help you if your legs get weak or numb from the local anesthetic.  Take all of your medicines the morning of the procedure with just enough water to swallow them.  If you have diabetes, make sure that you are scheduled to have your procedure done first thing in the morning, whenever possible.  If you have diabetes, take only half of your insulin dose and notify our nurse that you have done so as soon as you arrive at the clinic.  If you are diabetic, but only take blood sugar pills (oral hypoglycemic), then do not take them on the morning of your procedure.  You may take them after you have had the procedure.  Do not take aspirin or any aspirin-containing medications, at least eleven (11) days prior to the procedure.  They may prolong bleeding.  Wear loose fitting clothing that may be easy to take off and that you would  not mind if it got stained with Betadine or blood.  Do not wear any  jewelry or perfume  Remove any nail coloring.  It will interfere with some of our monitoring equipment.  NOTE: Remember that this is not meant to be interpreted as a complete list of all possible complications.  Unforeseen problems may occur.  BLOOD THINNERS The following drugs contain aspirin or other products, which can cause increased bleeding during surgery and should not be taken for 2 weeks prior to and 1 week after surgery.  If you should need take something for relief of minor pain, you may take acetaminophen which is found in Tylenol,m Datril, Anacin-3 and Panadol. It is not blood thinner. The products listed below are.  Do not take any of the products listed below in addition to any listed on your instruction sheet.  A.P.C or A.P.C with Codeine Codeine Phosphate Capsules #3 Ibuprofen Ridaura  ABC compound Congesprin Imuran rimadil  Advil Cope Indocin Robaxisal  Alka-Seltzer Effervescent Pain Reliever and Antacid Coricidin or Coricidin-D  Indomethacin Rufen  Alka-Seltzer plus Cold Medicine Cosprin Ketoprofen S-A-C Tablets  Anacin Analgesic Tablets or Capsules Coumadin Korlgesic Salflex  Anacin Extra Strength Analgesic tablets or capsules CP-2 Tablets Lanoril Salicylate  Anaprox Cuprimine Capsules Levenox Salocol  Anexsia-D Dalteparin Magan Salsalate  Anodynos Darvon compound Magnesium Salicylate Sine-off  Ansaid Dasin Capsules Magsal Sodium Salicylate  Anturane Depen Capsules Marnal Soma  APF Arthritis pain formula Dewitt's Pills Measurin Stanback  Argesic Dia-Gesic Meclofenamic Sulfinpyrazone  Arthritis Bayer Timed Release Aspirin Diclofenac Meclomen Sulindac  Arthritis pain formula Anacin Dicumarol Medipren Supac  Analgesic (Safety coated) Arthralgen Diffunasal Mefanamic Suprofen  Arthritis Strength Bufferin Dihydrocodeine Mepro Compound Suprol  Arthropan liquid Dopirydamole Methcarbomol with Aspirin Synalgos  ASA tablets/Enseals Disalcid Micrainin Tagament  Ascriptin Doan's  Midol Talwin  Ascriptin A/D Dolene Mobidin Tanderil  Ascriptin Extra Strength Dolobid Moblgesic Ticlid  Ascriptin with Codeine Doloprin or Doloprin with Codeine Momentum Tolectin  Asperbuf Duoprin Mono-gesic Trendar  Aspergum Duradyne Motrin or Motrin IB Triminicin  Aspirin plain, buffered or enteric coated Durasal Myochrisine Trigesic  Aspirin Suppositories Easprin Nalfon Trillsate  Aspirin with Codeine Ecotrin Regular or Extra Strength Naprosyn Uracel  Atromid-S Efficin Naproxen Ursinus  Auranofin Capsules Elmiron Neocylate Vanquish  Axotal Emagrin Norgesic Verin  Azathioprine Empirin or Empirin with Codeine Normiflo Vitamin E  Azolid Emprazil Nuprin Voltaren  Bayer Aspirin plain, buffered or children's or timed BC Tablets or powders Encaprin Orgaran Warfarin Sodium  Buff-a-Comp Enoxaparin Orudis Zorpin  Buff-a-Comp with Codeine Equegesic Os-Cal-Gesic   Buffaprin Excedrin plain, buffered or Extra Strength Oxalid   Bufferin Arthritis Strength Feldene Oxphenbutazone   Bufferin plain or Extra Strength Feldene Capsules Oxycodone with Aspirin   Bufferin with Codeine Fenoprofen Fenoprofen Pabalate or Pabalate-SF   Buffets II Flogesic Panagesic   Buffinol plain or Extra Strength Florinal or Florinal with Codeine Panwarfarin   Buf-Tabs Flurbiprofen Penicillamine   Butalbital Compound Four-way cold tablets Penicillin   Butazolidin Fragmin Pepto-Bismol   Carbenicillin Geminisyn Percodan   Carna Arthritis Reliever Geopen Persantine   Carprofen Gold's salt Persistin   Chloramphenicol Goody's Phenylbutazone   Chloromycetin Haltrain Piroxlcam   Clmetidine heparin Plaquenil   Cllnoril Hyco-pap Ponstel   Clofibrate Hydroxy chloroquine Propoxyphen         Before stopping any of these medications, be sure to consult the physician who ordered them.  Some, such as Coumadin (Warfarin) are ordered to prevent or treat serious conditions such as "deep thrombosis", "pumonary embolisms", and other heart  problems.  The amount of time that you may need off of the medication may also vary with the medication and the reason for which you were taking it.  If you are taking any of these medications, please make sure you notify your pain physician before you undergo any procedures.

## 2017-07-19 ENCOUNTER — Telehealth: Payer: Self-pay

## 2017-07-19 NOTE — Telephone Encounter (Signed)
Post procedure phone call.  Patient states he is doing very well.  No concerns at this time.

## 2017-07-24 ENCOUNTER — Ambulatory Visit: Payer: PPO | Admitting: Pain Medicine

## 2017-07-25 ENCOUNTER — Ambulatory Visit: Payer: PPO | Admitting: Pain Medicine

## 2017-08-05 ENCOUNTER — Telehealth: Payer: Self-pay | Admitting: *Deleted

## 2017-08-06 NOTE — Telephone Encounter (Signed)
Had good relief after RF for about 3 weeks. Suddenly had increased pain in the lower back. No other symptoms. Explained that increased pain following RF is expected, lasting up to 6 weeks. Has appointment for another RF on 08-08-17, but will probably cancel. Instructed patient to keep appointment if he is still concerned.

## 2017-08-07 DIAGNOSIS — J449 Chronic obstructive pulmonary disease, unspecified: Secondary | ICD-10-CM | POA: Diagnosis not present

## 2017-08-07 DIAGNOSIS — R0609 Other forms of dyspnea: Secondary | ICD-10-CM | POA: Diagnosis not present

## 2017-08-07 DIAGNOSIS — J310 Chronic rhinitis: Secondary | ICD-10-CM | POA: Diagnosis not present

## 2017-08-07 DIAGNOSIS — R918 Other nonspecific abnormal finding of lung field: Secondary | ICD-10-CM | POA: Diagnosis not present

## 2017-08-07 DIAGNOSIS — G4733 Obstructive sleep apnea (adult) (pediatric): Secondary | ICD-10-CM | POA: Diagnosis not present

## 2017-08-08 ENCOUNTER — Ambulatory Visit: Payer: PPO | Admitting: Pain Medicine

## 2017-08-08 NOTE — Progress Notes (Deleted)
Patient's Name: James [name]  MRN: [id number]  Referring Provider: [name], MD  DOB: 14-Mar-1956  PCP: [name], MD  DOS: 08/08/2017  Note by: Gaspar Cola, MD  Service setting: Ambulatory outpatient  Specialty: Interventional Pain Management  Patient type: Established  Location: [facility] (AMB) Pain Management Facility  Visit type: Interventional Procedure   Primary Reason for Visit: Interventional Pain Management Treatment. CC: No chief complaint on file.  Procedure:  Anesthesia, Analgesia, Anxiolysis:  Type: Therapeutic Medial Branch Facet Radiofrequency Ablation Region: Lumbar Level: L2, L3, L4, L5, & S1 Medial Branch Level(s) Laterality: Right-Sided  Type: Local Anesthesia with Moderate (Conscious) Sedation Local Anesthetic: Lidocaine 1% Route: Intravenous (IV) IV Access: Secured Sedation: Meaningful verbal contact was maintained at all times during the procedure  Indication(s): Analgesia and Anxiety   Indications: 1. Lumbar facet syndrome (Location of Primary Source of Pain) (Bilateral) (R>L)   2. Lumbar spondylosis   3. Chronic low back pain (Location of Primary Source of Pain) (Bilateral) (R>L)    Mr. Pipe has either failed to respond, was unable to tolerate, or simply did not get enough benefit from other more conservative therapies including, but not limited to: 1. Over-the-counter medications 2. Anti-inflammatory medications 3. Muscle relaxants 4. Membrane stabilizers 5. Opioids 6. Physical therapy 7. Modalities (Heat, ice, etc.) 8. Invasive techniques such as nerve blocks. Mr. Eberwein has attained more than 50% relief of the pain from a series of diagnostic injections conducted in separate occasions.  Pain Score: Pre-procedure:  /10 Post-procedure:  /10  Pre-op Assessment:  Mr. Sardina is a 61 y.o. (year old), male patient, seen today for interventional treatment. He  has a past surgical history that includes Spine surgery; Eye surgery;  Fracture surgery (Right); Fracture surgery (Bilateral); Fracture surgery (Bilateral); Joint replacement (Right); Spinal cord stimulator implant; Spinal cord stimulator removal; Patella arthroplasty; Shoulder arthroscopy (Bilateral); Esophagogastroduodenoscopy (egd) with propofol (N/A, 01/20/2016); and Colonoscopy (2009). Mr. Paff has a current medication list which includes the following prescription(s): albuterol, azelastine, budesonide-formoterol, fentanyl, fentanyl, fentanyl, loratadine, methotrexate, metoprolol tartrate, metoprolol tartrate, oxycodone hcl, oxycodone hcl, oxycodone hcl, oxycodone-acetaminophen, polyethylene glycol powder, promethazine, sucralfate, temazepam, and tizanidine. His primarily concern today is the No chief complaint on file.  Initial Vital Signs: There were no vitals taken for this visit. BMI: Estimated body mass index is 34.58 kg/m as calculated from the following:   Height as of 07/18/17: 6' (1.829 m).   Weight as of 07/18/17: 255 lb (115.7 kg).  Risk Assessment: Allergies: Reviewed. He is allergic to penicillin g; penicillins; lactose; nsaids; prednisone; procaine; sulfasalazine; talwin [pentazocine]; codeine; and sulfa antibiotics.  Allergy Precautions: None required Coagulopathies: Reviewed. None identified.  Blood-thinner therapy: None at this time Active Infection(s): Reviewed. None identified. Mr. Sheridan is afebrile  Site Confirmation: Mr. Sterkel was asked to confirm the procedure and laterality before marking the site Procedure checklist: Completed Consent: Before the procedure and under the influence of no sedative(s), amnesic(s), or anxiolytics, the patient was informed of the treatment options, risks and possible complications. To fulfill our ethical and legal obligations, as recommended by the American Medical Association's Code of Ethics, I have informed the patient of my clinical impression; the nature and purpose of the treatment or procedure; the  risks, benefits, and possible complications of the intervention; the alternatives, including doing nothing; the risk(s) and benefit(s) of the alternative treatment(s) or procedure(s); and the risk(s) and benefit(s) of doing nothing. The patient was provided information about the general risks and possible complications associated with the  procedure. These may include, but are not limited to: failure to achieve desired goals, infection, bleeding, organ or nerve damage, allergic reactions, paralysis, and death. In addition, the patient was informed of those risks and complications associated to Spine-related procedures, such as failure to decrease pain; infection (i.e.: Meningitis, epidural or intraspinal abscess); bleeding (i.e.: epidural hematoma, subarachnoid hemorrhage, or any other type of intraspinal or peri-dural bleeding); organ or nerve damage (i.e.: Any type of peripheral nerve, nerve root, or spinal cord injury) with subsequent damage to sensory, motor, and/or autonomic systems, resulting in permanent pain, numbness, and/or weakness of one or several areas of the body; allergic reactions; (i.e.: anaphylactic reaction); and/or death. Furthermore, the patient was informed of those risks and complications associated with the medications. These include, but are not limited to: allergic reactions (i.e.: anaphylactic or anaphylactoid reaction(s)); adrenal axis suppression; blood sugar elevation that in diabetics may result in ketoacidosis or comma; water retention that in patients with history of congestive heart failure may result in shortness of breath, pulmonary edema, and decompensation with resultant heart failure; weight gain; swelling or edema; medication-induced neural toxicity; particulate matter embolism and blood vessel occlusion with resultant organ, and/or nervous system infarction; and/or aseptic necrosis of one or more joints. Finally, the patient was informed that Medicine is not an exact  science; therefore, there is also the possibility of unforeseen or unpredictable risks and/or possible complications that may result in a catastrophic outcome. The patient indicated having understood very clearly. We have given the patient no guarantees and we have made no promises. Enough time was given to the patient to ask questions, all of which were answered to the patient's satisfaction. Mr. Erber has indicated that he wanted to continue with the procedure. Attestation: I, the ordering provider, attest that I have discussed with the patient the benefits, risks, side-effects, alternatives, likelihood of achieving goals, and potential problems during recovery for the procedure that I have provided informed consent. Date: 08/08/2017; Time: 7:38 AM  Pre-Procedure Preparation:  Monitoring: As per clinic protocol. Respiration, ETCO2, SpO2, BP, heart rate and rhythm monitor placed and checked for adequate function Safety Precautions: Patient was assessed for positional comfort and pressure points before starting the procedure. Time-out: I initiated and conducted the "Time-out" before starting the procedure, as per protocol. The patient was asked to participate by confirming the accuracy of the "Time Out" information. Verification of the correct person, site, and procedure were performed and confirmed by me, the nursing staff, and the patient. "Time-out" conducted as per Joint Commission's Universal Protocol (UP.01.01.01). "Time-out" Date & Time: 08/08/2017;   hrs.  Description of Procedure Process:   Position: Prone Target Area: For Lumbar Facet blocks, the target is the groove formed by the junction of the transverse process and superior articular process. For the L5 dorsal ramus, the target is the notch between superior articular process and sacral ala. For the S1 dorsal ramus, the target is the superior and lateral edge of the posterior S1 Sacral foramen. Approach: Paraspinal approach. Area Prepped:  Entire Posterior Lumbosacral Region Prepping solution: Hibiclens (4.0% Chlorhexidine gluconate solution) Safety Precautions: Aspiration looking for blood return was conducted prior to all injections. At no point did we inject any substances, as a needle was being advanced. No attempts were made at seeking any paresthesias. Safe injection practices and needle disposal techniques used. Medications properly checked for expiration dates. SDV (single dose vial) medications used. Description of the Procedure: Protocol guidelines were followed. The patient was placed in position over the  fluoroscopy table. The target area was identified and the area prepped in the usual manner. Skin desensitized using vapocoolant spray. Skin & deeper tissues infiltrated with local anesthetic. Appropriate amount of time allowed to pass for local anesthetics to take effect. Radiofrequency needles were introduced to the area of the medial branch at the junction of the superior articular process and transverse process using fluoroscopy. Using the [employer], sensory stimulation using 50 Hz was used to locate & identify the nerve, making sure that the needle was positioned such that there was no sensory stimulation below 0.3 V or above 0.7 V. Stimulation using 2 Hz was used to evaluate the motor component. Care was taken not to lesion any nerves that demonstrated motor stimulation of the lower extremities at an output of less than 2.5 times that of the sensory threshold, or a maximum of 2.0 V. Once satisfactory placement of the needles was achieved, the above solution was slowly injected after negative aspiration. After waiting for at least 2 minutes, the ablation was performed at 80 degrees C for 60 seconds.The needles were then removed and the area cleansed, making sure to leave some of the prepping solution back to take advantage of its long term bactericidal properties. Intra-operative Compliance:  Compliant  Illustration of the posterior view of the lumbar spine and the posterior neural structures. Laminae of L2 through S1 are labeled. DPRL5, dorsal primary ramus of L5; DPRS1, dorsal primary ramus of S1; DPR3, dorsal primary ramus of L3; FJ, facet (zygapophyseal) joint L3-L4; I, inferior articular process of L4; LB1, lateral branch of dorsal primary ramus of L1; IAB, inferior articular branches from L3 medial branch (supplies L4-L5 facet joint); IBP, intermediate branch plexus; MB3, medial branch of dorsal primary ramus of L3; NR3, third lumbar nerve root; S, superior articular process of L5; SAB, superior articular branches from L4 (supplies L4-5 facet joint also); TP3, transverse process of L3.  There were no vitals filed for this visit.  Start Time:   hrs. End Time:   hrs. Materials & Medications:  Needle(s) Type: Teflon-coated, curved tip, Radiofrequency needle(s) Gauge: 22G Length: 10cm Medication(s): Mr. Helwig had no medications administered during this visit. Please see chart orders for dosing details.  Imaging Guidance (Spinal):  Type of Imaging Technique: Fluoroscopy Guidance (Spinal) Indication(s): Assistance in needle guidance and placement for procedures requiring needle placement in or near specific anatomical locations not easily accessible without such assistance. Exposure Time: Please see nurses notes. Contrast: None used. Fluoroscopic Guidance: I was personally present during the use of fluoroscopy. "Tunnel Vision Technique" used to obtain the best possible view of the target area. Parallax error corrected before commencing the procedure. "Direction-depth-direction" technique used to introduce the needle under continuous pulsed fluoroscopy. Once target was reached, antero-posterior, oblique, and lateral fluoroscopic projection used confirm needle placement in all planes. Images permanently stored in EMR. Interpretation: No contrast injected. I personally interpreted the  imaging intraoperatively. Adequate needle placement confirmed in multiple planes. Permanent images saved into the patient's record.  Antibiotic Prophylaxis:  Indication(s): None identified Antibiotic given: None  Post-operative Assessment:  EBL: None Complications: No immediate post-treatment complications observed by team, or reported by patient. Note: The patient tolerated the entire procedure well. A repeat set of vitals were taken after the procedure and the patient was kept under observation following institutional policy, for this type of procedure. Post-procedural neurological assessment was performed, showing return to baseline, prior to discharge. The patient was provided with post-procedure discharge instructions, including a section on  how to identify potential problems. Should any problems arise concerning this procedure, the patient was given instructions to immediately contact us, at any time, without hesitation. In any case, we plan to contact the patient by telephone for a follow-up status report regarding this interventional procedure. Comments:  No additional relevant information.  Plan of Care   Imaging Orders  No imaging studies ordered today   Procedure Orders    No procedure(s) ordered today    Medications ordered for procedure: No orders of the defined types were placed in this encounter.  Medications administered: Mr. Rocca had no medications administered during this visit.  See the medical record for exact dosing, route, and time of administration.  New Prescriptions   No medications on file   Disposition: Discharge home  Discharge Date & Time: 08/08/2017;   hrs.   Physician-requested Follow-up: No Follow-up on file. Future Appointments Date Time Provider [place]  08/08/2017 2:15 PM Milinda Pointer, MD [facility] None  08/28/2017 8:15 AM Milinda Pointer, MD [facility] None  08/29/2017 9:15 AM Vevelyn Francois, NP [facility] None   Primary  Care Physician: [name], MD Location: [facility] Note by: Gaspar Cola, MD Date: 08/08/2017; Time: 7:38 AM  Disclaimer:  Medicine is not an exact science. The only guarantee in medicine is that nothing is guaranteed. It is important to note that the decision to proceed with this intervention was based on the information collected from the patient. The Data and conclusions were drawn from the patient's questionnaire, the interview, and the physical examination. Because the information was provided in large part by the patient, it cannot be guaranteed that it has not been purposely or unconsciously manipulated. Every effort has been made to obtain as much relevant data as possible for this evaluation. It is important to note that the conclusions that lead to this procedure are derived in large part from the available data. Always take into account that the treatment will also be dependent on availability of resources and existing treatment guidelines, considered by other Pain Management Practitioners as being common knowledge and practice, at the time of the intervention. For Medico-Legal purposes, it is also important to point out that variation in procedural techniques and pharmacological choices are the acceptable norm. The indications, contraindications, technique, and results of the above procedure should only be interpreted and judged by a Board-Certified Interventional Pain Specialist with extensive familiarity and expertise in the same exact procedure and technique.

## 2017-08-27 NOTE — Progress Notes (Deleted)
Patient's Name: James [name]  MRN: [id number]  Referring Provider: [name], MD  DOB: 06/28/1956  PCP: [name], MD  DOS: 08/28/2017  Note by: Gaspar Cola, MD  Service setting: Ambulatory outpatient  Specialty: Interventional Pain Management  Location: [facility] (AMB) Pain Management Facility    Patient type: Established   Primary Reason(s) for Visit: Encounter for post-procedure evaluation of chronic illness with mild to moderate exacerbation CC: No chief complaint on file.  HPI  Mr. Hankin is a 61 y.o. year old, male patient, who comes today for a post-procedure evaluation. He has Airway hyperreactivity; Atrial fibrillation ([place]); Narrowing of intervertebral disc space; Abnormal echocardiogram; Gastric ulcer; Gastric catarrh; Acid reflux; Awareness of heartbeats; Hemorrhoid; Hepatitis; BP (high blood pressure); Lactose intolerance; Osteopenia; APC (atrial premature contractions); Apnea, sleep; Supraventricular tachycardia ([place]); Long term current use of opiate analgesic; Long term prescription opiate use; Opiate use (120 MME/Day); Opiate dependence ([place]); Encounter for therapeutic drug level monitoring; Chronic low back pain (Primary Source of Pain) (Bilateral) (R>L); Lumbar spondylosis; Chronic neck pain (Right); Cervical spondylosis (C7 intravertebral body cyst); Chronic cervical radicular pain (Right); Chronic lumbar radicular pain (Bilateral) (L>R) (L5 Dermatome); Osteoarthritis; Chronic hip pain; Chronic knee pain (Bilateral) (R>L); Complex regional pain syndrome type II of upper extremity (Right); Chronic upper extremity pain (Right); 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); Epidural fibrosis; Musculoskeletal pain; Neurogenic pain; Neuropathic pain;  Chronic sacroiliac joint pain (Bilateral) (R>L); Psoriatic arthritis ([place]); Controlled type 2 diabetes mellitus without complication ([place]); Chest pain; Acquired hypothyroidism; Chronic lower extremity pain (Secondary source of pain) (Bilateral) (L>R); Psoriasis with arthropathy ([place]); Hypothyroidism; Numbness and tingling of both legs; Abnormal nerve conduction studies (06/20/16); Chronic pain syndrome; Hypotestosteronism; Nausea without vomiting; and Acute postoperative pain on his problem list. His primarily concern today is the No chief complaint on file.  Pain Assessment: Location:     Radiating:   Onset:   Duration:   Quality:   Severity:  /10 (self-reported pain score)  Note: Reported level is compatible with observation.                   When using our objective Pain Scale, levels between 6 and 10/10 are said to belong in an emergency room, as it progressively worsens from a 6/10, described as severely limiting, requiring emergency care not usually available at an outpatient pain management facility. At a 6/10 level, communication becomes difficult and requires great effort. Assistance to reach the emergency department may be required. Facial flushing and profuse sweating along with potentially dangerous increases in heart rate and blood pressure will be evident. Effect on ADL:   Timing:   Modifying factors:    Mr. Giangrande comes in today for post-procedure evaluation after the treatment done on 07/18/2017.  Further details on both, my assessment(s), as well as the proposed treatment plan, please see below.  Post-Procedure Assessment  07/18/2017 Procedure: Therapeutic left sided lumbar facet radiofrequency ablation under fluoroscopic guidance and IV sedation Pre-procedure pain score:  4/10 Post-procedure pain score: 6/10 No relief, in fact the patient indicated patient to be worse after the procedure. Influential Factors: BMI:   Intra-procedural challenges: None observed.          Assessment challenges: None detected.              Reported side-effects: None.  Post-procedural adverse reactions or complications: None reported         Sedation: Sedation provided. When no sedatives are used, the analgesic levels obtained are directly associated to the effectiveness of the local anesthetics. However, when sedation is provided, the level of analgesia obtained during the initial 1 hour following the intervention, is believed to be the result of a combination of factors. These factors may include, but are not limited to: 1. The effectiveness of the local anesthetics used. 2. The effects of the analgesic(s) and/or anxiolytic(s) used. 3. The degree of discomfort experienced by the patient at the time of the procedure. 4. The patients ability and reliability in recalling and recording the events. 5. The presence and influence of possible secondary gains and/or psychosocial factors. Reported result: Relief experienced during the 1st hour after the procedure:   (Ultra-Short Term Relief)            Interpretative annotation: Clinically appropriate result. Analgesia during this period is likely to be Local Anesthetic and/or IV Sedative (Analgesic/Anxiolytic) related.          Effects of local anesthetic: The analgesic effects attained during this period are directly associated to the localized infiltration of local anesthetics and therefore cary significant diagnostic value as to the etiological location, or anatomical origin, of the pain. Expected duration of relief is directly dependent on the pharmacodynamics of the local anesthetic used. Long-acting (4-6 hours) anesthetics used.  Reported result: Relief during the next 4 to 6 hour after the procedure:   (Short-Term Relief)            Interpretative annotation: Clinically appropriate result. Analgesia during this period is likely to be Local Anesthetic-related.          Long-term benefit: Defined as the period of time past the  expected duration of local anesthetics (1 hour for short-acting and 4-6 hours for long-acting). With the possible exception of prolonged sympathetic blockade from the local anesthetics, benefits during this period are typically attributed to, or associated with, other factors such as analgesic sensory neuropraxia, antiinflammatory effects, or beneficial biochemical changes provided by agents other than the local anesthetics.  Reported result: Extended relief following procedure:   (Long-Term Relief)            Interpretative annotation: Clinically appropriate result. Good relief. No permanent benefit expected. Inflammation plays a part in the etiology to the pain.          Current benefits: Defined as reported results that persistent at this point in time.   Analgesia: *** %            Function: Somewhat improved ROM: Somewhat improved Interpretative annotation: Recurrence of symptoms. No permanent benefit expected. Effective diagnostic intervention.          Interpretation: Results would suggest a successful diagnostic intervention.                  Plan:  Please see "Plan of Care" for details.        Laboratory Chemistry  Inflammation Markers (CRP: Acute Phase) (ESR: Chronic Phase) Lab Results  Component Value Date   CRP <0.80 12/06/2016   ESRSEDRATE 9 12/06/2016                 Renal Function Markers Lab Results  Component Value Date   BUN 14 12/06/2016   CREATININE 1.26 (H) 12/06/2016   GFRAA >60 12/06/2016   GFRNONAA >60 12/06/2016  Hepatic Function Markers Lab Results  Component Value Date   AST 35 12/06/2016   ALT 33 12/06/2016   ALBUMIN 4.4 12/06/2016   ALKPHOS 47 12/06/2016                 Electrolytes Lab Results  Component Value Date   NA 139 12/06/2016   K 4.6 12/06/2016   CL 103 12/06/2016   CALCIUM 9.6 12/06/2016   MG 2.2 12/06/2016                 Neuropathy Markers Lab Results  Component Value Date   VITAMINB12 423 12/06/2016                  Bone Pathology Markers Lab Results  Component Value Date   ALKPHOS 47 12/06/2016   25OHVITD1 42 12/06/2016   25OHVITD2 <1.0 12/06/2016   25OHVITD3 42 12/06/2016   CALCIUM 9.6 12/06/2016                 Coagulation Parameters No results found for: INR, LABPROT, APTT, PLT               Cardiovascular Markers No results found for: BNP, HGB, HCT               Note: Lab results reviewed.  Recent Diagnostic Imaging Results  DG C-Arm 1-60 Min-No Report Fluoroscopy was utilized by the requesting physician.  No radiographic  interpretation.   Complexity Note: Imaging results reviewed. Results shared with Mr. Coryell, using Layman's terms.                         Meds   Current Outpatient Prescriptions:  .  albuterol (PROAIR HFA) 108 (90 BASE) MCG/ACT inhaler, Inhale 2 puffs into the lungs every 4 (four) hours as needed for wheezing or shortness of breath. , Disp: , Rfl:  .  azelastine (ASTELIN) 0.1 % nasal spray, Place 1 spray into both nostrils 2 (two) times daily. , Disp: , Rfl:  .  budesonide-formoterol (SYMBICORT) 80-4.5 MCG/ACT inhaler, Inhale 2 puffs into the lungs 2 (two) times daily., Disp: , Rfl:  .  fentaNYL (DURAGESIC - DOSED MCG/HR) 12 MCG/HR, Place 1 patch (12.5 mcg total) onto the skin every 3 (three) days., Disp: 10 patch, Rfl: 0 .  fentaNYL (DURAGESIC - DOSED MCG/HR) 12 MCG/HR, Place 1 patch (12.5 mcg total) onto the skin every 3 (three) days., Disp: 10 patch, Rfl: 0 .  fentaNYL (DURAGESIC - DOSED MCG/HR) 12 MCG/HR, Place 1 patch (12.5 mcg total) onto the skin every 3 (three) days., Disp: 10 patch, Rfl: 0 .  Loratadine 10 MG CAPS, Take 10 mg by mouth daily. , Disp: , Rfl:  .  methotrexate 50 MG/2ML injection, Inject 50 mg/m2 as directed once a week. , Disp: , Rfl:  .  metoprolol (LOPRESSOR) 100 MG tablet, TAKE 1 TABLET BY MOUTH TWICE A DAY, Disp: , Rfl:  .  metoprolol tartrate (LOPRESSOR) 100 MG tablet, Take by mouth., Disp: , Rfl:  .  Oxycodone HCl 20 MG  TABS, Take 1 tablet (20 mg total) by mouth every 8 (eight) hours., Disp: 90 tablet, Rfl: 0 .  Oxycodone HCl 20 MG TABS, Take 1 tablet (20 mg total) by mouth every 8 (eight) hours as needed., Disp: 90 tablet, Rfl: 0 .  Oxycodone HCl 20 MG TABS, Take 1 tablet (20 mg total) by mouth every 8 (eight) hours as needed., Disp: 90 tablet, Rfl: 0 .  oxyCODONE-acetaminophen (PERCOCET) 5-325 MG tablet, Take 1-2 tablets by mouth every 6 (six) hours as needed for severe pain., Disp: 80 tablet, Rfl: 0 .  polyethylene glycol powder (GLYCOLAX/MIRALAX) powder, Take as directed for colonic prep., Disp: , Rfl:  .  promethazine (PHENERGAN) 12.5 MG tablet, Take by mouth every 4 (four) hours as needed. , Disp: , Rfl:  .  sucralfate (CARAFATE) 1 G tablet, Take 1 g by mouth 4 (four) times daily. , Disp: , Rfl:  .  temazepam (RESTORIL) 15 MG capsule, Take 15 mg by mouth at bedtime as needed for sleep., Disp: , Rfl:  .  tiZANidine (ZANAFLEX) 4 MG capsule, Take 4 mg by mouth 3 (three) times daily as needed for muscle spasms., Disp: , Rfl:   ROS  Constitutional: Denies any fever or chills Gastrointestinal: No reported hemesis, hematochezia, vomiting, or acute GI distress Musculoskeletal: Denies any acute onset joint swelling, redness, loss of ROM, or weakness Neurological: No reported episodes of acute onset apraxia, aphasia, dysarthria, agnosia, amnesia, paralysis, loss of coordination, or loss of consciousness  Allergies  Mr. Umbach is allergic to penicillin g; penicillins; lactose; nsaids; prednisone; procaine; sulfasalazine; talwin [pentazocine]; codeine; and sulfa antibiotics.  [place]  Drug: Mr. Adachi  reports that he does not use drugs. Alcohol:  reports that he does not drink alcohol. Tobacco:  reports that he has quit smoking. His smoking use included Cigarettes. His smokeless tobacco use includes Chew. 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 Surgical: Mr. Coger  has a past surgical history that includes Spine surgery; Eye surgery; Fracture surgery (Right); Fracture surgery (Bilateral); Fracture surgery (Bilateral); Joint replacement (Right); Spinal cord stimulator implant; Spinal cord stimulator removal; Patella arthroplasty; Shoulder arthroscopy (Bilateral); Esophagogastroduodenoscopy (egd) with propofol (N/A, 01/20/2016); and Colonoscopy (2009). Family: family history includes Arthritis in his father and mother; COPD in his father; Cancer in his father and mother; Diabetes in his mother; Hypertension in his father and mother; Stroke in his mother.  Constitutional Exam  General appearance: Well nourished, well developed, and well hydrated. In no apparent acute distress There were no vitals filed for this visit. BMI Assessment: Estimated body mass index is 34.58 kg/m as calculated from the following:   Height as of 07/18/17: 6' (1.829 m).   Weight as of 07/18/17: 255 lb (115.7 kg).  BMI interpretation table: BMI level Category Range association with higher incidence of chronic pain  <18 kg/m2 Underweight   18.5-24.9 kg/m2 Ideal body weight   25-29.9 kg/m2 Overweight Increased incidence by 20%  30-34.9 kg/m2 Obese (Class I) Increased incidence by 68%  35-39.9 kg/m2 Severe obesity (Class II) Increased incidence by 136%  >40 kg/m2 Extreme obesity (Class III) Increased incidence by 254%   BMI Readings from Last 4 Encounters:  07/18/17 34.58 kg/m  05/30/17 33.91 kg/m  05/02/17 33.63 kg/m  04/15/17 33.23  kg/m   Wt Readings from Last 4 Encounters:  07/18/17 255 lb (115.7 kg)  05/30/17 250 lb (113.4 kg)  05/02/17 248 lb (112.5 kg)  04/15/17 245 lb (111.1 kg)  Psych/Mental status: Alert, oriented x 3 (person, place, & time)       Eyes: PERLA Respiratory: No evidence of acute respiratory  distress  Cervical Spine Area Exam  Skin & Axial Inspection: No masses, redness, edema, swelling, or associated skin lesions Alignment: Symmetrical Functional ROM: Unrestricted ROM      Stability: No instability detected Muscle Tone/Strength: Functionally intact. No obvious neuro-muscular anomalies detected. Sensory (Neurological): Unimpaired Palpation: No palpable anomalies              Upper Extremity (UE) Exam    Side: Right upper extremity  Side: Left upper extremity  Skin & Extremity Inspection: Skin color, temperature, and hair growth are WNL. No peripheral edema or cyanosis. No masses, redness, swelling, asymmetry, or associated skin lesions. No contractures.  Skin & Extremity Inspection: Skin color, temperature, and hair growth are WNL. No peripheral edema or cyanosis. No masses, redness, swelling, asymmetry, or associated skin lesions. No contractures.  Functional ROM: Unrestricted ROM          Functional ROM: Unrestricted ROM          Muscle Tone/Strength: Functionally intact. No obvious neuro-muscular anomalies detected.  Muscle Tone/Strength: Functionally intact. No obvious neuro-muscular anomalies detected.  Sensory (Neurological): Unimpaired          Sensory (Neurological): Unimpaired          Palpation: No palpable anomalies              Palpation: No palpable anomalies              Specialized Test(s): Deferred         Specialized Test(s): Deferred          Thoracic Spine Area Exam  Skin & Axial Inspection: No masses, redness, or swelling Alignment: Symmetrical Functional ROM: Unrestricted ROM Stability: No instability detected Muscle Tone/Strength: Functionally intact. No obvious  neuro-muscular anomalies detected. Sensory (Neurological): Unimpaired Muscle strength & Tone: No palpable anomalies  Lumbar Spine Area Exam  Skin & Axial Inspection: No masses, redness, or swelling Alignment: Symmetrical Functional ROM: Decreased ROM      Stability: No instability detected Muscle Tone/Strength: Functionally intact. No obvious neuro-muscular anomalies detected. Sensory (Neurological): Movement-associated pain Palpation: Complains of area being tender to palpation       Provocative Tests: Lumbar Hyperextension and rotation test: Positive bilaterally for facet joint pain. Lumbar Lateral bending test: evaluation deferred today       Patrick's Maneuver: evaluation deferred today                    Gait & Posture Assessment  Ambulation: Unassisted Gait: Relatively normal for age and body habitus Posture: WNL   Lower Extremity Exam    Side: Right lower extremity  Side: Left lower extremity  Skin & Extremity Inspection: Skin color, temperature, and hair growth are WNL. No peripheral edema or cyanosis. No masses, redness, swelling, asymmetry, or associated skin lesions. No contractures.  Skin & Extremity Inspection: Skin color, temperature, and hair growth are WNL. No peripheral edema or cyanosis. No masses, redness, swelling, asymmetry, or associated skin lesions. No contractures.  Functional ROM: Unrestricted ROM          Functional ROM: Unrestricted ROM          Muscle Tone/Strength: Functionally intact. No obvious neuro-muscular anomalies detected.  Muscle Tone/Strength: Functionally intact. No obvious neuro-muscular anomalies detected.  Sensory (Neurological): Unimpaired  Sensory (Neurological): Unimpaired  Palpation: No palpable anomalies  Palpation: No palpable anomalies   Assessment  Primary Diagnosis & Pertinent Problem List: The primary encounter diagnosis was Lumbar facet syndrome (Bilateral) (R>L). Diagnoses of Lumbar spondylosis and Chronic low  back pain (Primary  Source of Pain) (Bilateral) (R>L) were also pertinent to this visit.  Status Diagnosis  Controlled Controlled Controlled 1. Lumbar facet syndrome (Bilateral) (R>L)   2. Lumbar spondylosis   3. Chronic low back pain (Primary Source of Pain) (Bilateral) (R>L)     Problems updated and reviewed during this visit: Problem  Chronic lower extremity pain (Secondary source of pain) (Bilateral) (L>R)  Lumbar facet syndrome (Bilateral) (R>L)  Chronic low back pain (Primary Source of Pain) (Bilateral) (R>L)  Chronic lumbar radicular pain (Bilateral) (L>R) (L5 Dermatome)   Plan of Care  Pharmacotherapy (Medications Ordered): No orders of the defined types were placed in this encounter.  New Prescriptions   No medications on file   Medications administered today: Mr. Jorge had no medications administered during this visit.  Procedure Orders    No procedure(s) ordered today   Lab Orders  No laboratory test(s) ordered today   Imaging Orders  No imaging studies ordered today   Referral Orders  No referral(s) requested today    Interventional management options: Planned, scheduled, and/or pending:   Therapeutic rightlumbar facet RFA, under fluoroscopic guidance and IV sedation. PT is not an option secondary to cardiac history   Considering:   Diagnostic Right-sided cervical epiduralsteroid injection.  Diagnostic Right-sided L4-5 lumbar epiduralsteroid injection.  Diagnostic bilateral lumbar facetblock. Possible bilateral lumbar facet radiofrequencyablation. (Left done on 07/18/2017) Diagnostic bilateral cervical facetblock.  Possible bilateral cervical facet radiofrequencyablation.   Palliative PRN treatment(s):   For the arm pain and numbness, right-sided cervical epidural steroid injection For the lower extremity pain, right-sided L4-5 lumbar epidural steroid injection For the low back pain, diagnostic bilateral lumbar facet block For the neck pain, diagnostic  bilateral cervical facet block   Provider-requested follow-up: No Follow-up on file.  Future Appointments Date Time Provider [place]  08/28/2017 8:15 AM Milinda Pointer, MD [facility] None  08/29/2017 9:15 AM Vevelyn Francois, NP [facility] None   Primary Care Physician: [name], MD Location: [facility] Facility Note by: Gaspar Cola, MD Date: 08/28/2017; Time: 4:32 PM

## 2017-08-28 ENCOUNTER — Ambulatory Visit: Payer: PPO | Admitting: Pain Medicine

## 2017-08-29 ENCOUNTER — Ambulatory Visit: Payer: PPO | Attending: Nurse Practitioner | Admitting: Nurse Practitioner

## 2017-08-29 ENCOUNTER — Encounter: Payer: Self-pay | Admitting: Nurse Practitioner

## 2017-08-29 VITALS — BP 124/75 | HR 64 | Temp 98.0°F | Resp 18 | Ht 72.0 in | Wt 260.0 lb

## 2017-08-29 DIAGNOSIS — G894 Chronic pain syndrome: Secondary | ICD-10-CM | POA: Insufficient documentation

## 2017-08-29 DIAGNOSIS — M545 Low back pain, unspecified: Secondary | ICD-10-CM

## 2017-08-29 DIAGNOSIS — M5412 Radiculopathy, cervical region: Secondary | ICD-10-CM | POA: Insufficient documentation

## 2017-08-29 DIAGNOSIS — E119 Type 2 diabetes mellitus without complications: Secondary | ICD-10-CM | POA: Diagnosis not present

## 2017-08-29 DIAGNOSIS — Z79891 Long term (current) use of opiate analgesic: Secondary | ICD-10-CM | POA: Insufficient documentation

## 2017-08-29 DIAGNOSIS — M542 Cervicalgia: Secondary | ICD-10-CM | POA: Diagnosis not present

## 2017-08-29 DIAGNOSIS — G8929 Other chronic pain: Secondary | ICD-10-CM

## 2017-08-29 DIAGNOSIS — E039 Hypothyroidism, unspecified: Secondary | ICD-10-CM | POA: Insufficient documentation

## 2017-08-29 DIAGNOSIS — Z880 Allergy status to penicillin: Secondary | ICD-10-CM | POA: Diagnosis not present

## 2017-08-29 DIAGNOSIS — G5641 Causalgia of right upper limb: Secondary | ICD-10-CM

## 2017-08-29 MED ORDER — FENTANYL 12 MCG/HR TD PT72: 12.5000 ug | MEDICATED_PATCH | TRANSDERMAL | 0 refills | Status: DC

## 2017-08-29 MED ORDER — OXYCODONE HCL 20 MG PO TABS: 20.0000 mg | ORAL_TABLET | Freq: Three times a day (TID) | ORAL | 0 refills | Status: DC

## 2017-08-29 MED ORDER — OXYCODONE HCL 20 MG PO TABS: 20.0000 mg | ORAL_TABLET | Freq: Three times a day (TID) | ORAL | 0 refills | Status: DC | PRN

## 2017-08-29 NOTE — Patient Instructions (Addendum)
Yo11u have been given 2 prescriptions for Fentanyl patches and 2 scripts for oxycodone.  ____________________________________________________________________________________________  Medication Rules  Applies to: All patients receiving prescriptions (written or electronic).  Pharmacy of record: Pharmacy where electronic prescriptions will be sent. If written prescriptions are taken to a different pharmacy, please inform the nursing staff. The pharmacy listed in the electronic medical record should be the one where you would like electronic prescriptions to be sent.  Prescription refills: Only during scheduled appointments. Applies to both, written and electronic prescriptions.  NOTE: The following applies primarily to controlled substances (Opioid* Pain Medications).   Patient's responsibilities: 1. Pain Pills: Bring all pain pills to every appointment (except for procedure appointments). 2. Pill Bottles: Bring pills in original pharmacy bottle. Always bring newest bottle. Bring bottle, even if empty. 3. Medication refills: You are responsible for knowing and keeping track of what medications you need refilled. The day before your appointment, write a list of all prescriptions that need to be refilled. Bring that list to your appointment and give it to the admitting nurse. Prescriptions will be written only during appointments. If you forget a medication, it will not be "Called in", "Faxed", or "electronically sent". You will need to get another appointment to get these prescribed. 4. Prescription Accuracy: You are responsible for carefully inspecting your prescriptions before leaving our office. Have the discharge nurse carefully go over each prescription with you, before taking them home. Make sure that your name is accurately spelled, that your address is correct. Check the name and dose of your medication to make sure it is accurate. Check the number of pills, and the written instructions to make  sure they are clear and accurate. Make sure that you are given enough medication to last until your next medication refill appointment. 5. Taking Medication: Take medication as prescribed. Never take more pills than instructed. Never take medication more frequently than prescribed. Taking less pills or less frequently is permitted and encouraged, when it comes to controlled substances (written prescriptions).  6. Inform other Doctors: Always inform, all of your healthcare providers, of all the medications you take. 7. Pain Medication from other Providers: You are not allowed to accept any additional pain medication from any other Doctor or Healthcare provider. There are two exceptions to this rule. (see below) In the event that you require additional pain medication, you are responsible for notifying us, as stated below. 8. Medication Agreement: You are responsible for carefully reading and following our Medication Agreement. This must be signed before receiving any prescriptions from our practice. Safely store a copy of your signed Agreement. Violations to the Agreement will result in no further prescriptions. (Additional copies of our Medication Agreement are available upon request.) 9. Laws, Rules, & Regulations: All patients are expected to follow all Federal and [employer], [employer], Rules, [employer]. Ignorance of the Laws does not constitute a valid excuse. The use of any illegal substances is prohibited. 10. Adopted CDC guidelines & recommendations: Target dosing levels will be at or below 60 MME/day. Use of benzodiazepines** is not recommended.  Exceptions: There are only two exceptions to the rule of not receiving pain medications from other Healthcare Providers. 1. Exception #1 (Emergencies): In the event of an emergency (i.e.: accident requiring emergency care), you are allowed to receive additional pain medication. However, you are responsible for: As soon as you are able, call our office  [phone], at any time of the day or night, and leave a message stating your name,  the date and nature of the emergency, and the name and dose of the medication prescribed. In the event that your call is answered by a member of our staff, make sure to document and save the date, time, and the name of the person that took your information.  2. Exception #2 (Planned Surgery): In the event that you are scheduled by another doctor or dentist to have any type of surgery or procedure, you are allowed (for a period no longer than 30 days), to receive additional pain medication, for the acute post-op pain. However, in this case, you are responsible for picking up a copy of our "Post-op Pain Management for Surgeons" handout, and giving it to your surgeon or dentist. This document is available at our office, and does not require an appointment to obtain it. Simply go to our office during business hours (Monday-Thursday from 8:00 AM to 4:00 PM) (Friday 8:00 AM to 12:00 Noon) or if you have a scheduled appointment with [place], prior to your surgery, and ask for it by name. In addition, you will need to provide [place] with your name, name of your surgeon, type of surgery, and date of procedure or surgery.  *Opioid medications include: morphine, codeine, oxycodone, oxymorphone, hydrocodone, hydromorphone, meperidine, tramadol, tapentadol, buprenorphine, fentanyl, methadone. **Benzodiazepine medications include: diazepam (Valium), alprazolam (Xanax), clonazepam (Klonopine), lorazepam (Ativan), clorazepate (Tranxene), chlordiazepoxide (Librium), estazolam (Prosom), oxazepam (Serax), temazepam (Restoril), triazolam (Halcion)  ____________________________________________________________________________________________

## 2017-08-29 NOTE — Progress Notes (Deleted)
Patient's Name: James [name]  MRN: [id number]  Referring Provider: [name], MD  DOB: 1956/01/19  PCP: [name], MD  DOS: 08/29/2017  Note by: Vevelyn Francois NP  Service setting: Ambulatory outpatient  Specialty: Interventional Pain Management  Location: [facility] (AMB) Pain Management Facility    Patient type: Established    Primary Reason(s) for Visit: Encounter for prescription drug management. (Level of risk: moderate)  CC: Back Pain (low); Hip Pain (bilateral); Knee Pain (bilateral); Foot Pain (bilateral); and Neck Pain  HPI  James [name] is a 61 y.o. year old, male patient, who comes today for a medication management evaluation. He has Airway hyperreactivity; Atrial fibrillation ([place]); Narrowing of intervertebral disc space; Abnormal echocardiogram; Gastric ulcer; Gastric catarrh; Acid reflux; Awareness of heartbeats; Hemorrhoid; Hepatitis; BP (high blood pressure); Lactose intolerance; Osteopenia; APC (atrial premature contractions); Apnea, sleep; Supraventricular tachycardia ([place]); Long term current use of opiate analgesic; Long term prescription opiate use; Opiate use (120 MME/Day); Opiate dependence ([place]); Encounter for therapeutic drug level monitoring; Chronic low back pain (Primary Source of Pain) (Bilateral) (R>L); Lumbar spondylosis; Chronic neck pain (Right); Cervical spondylosis (C7 intravertebral body cyst); Chronic cervical radicular pain (Right); Chronic lumbar radicular pain (Bilateral) (L>R) (L5 Dermatome); Osteoarthritis; Chronic hip pain; Chronic knee pain (Bilateral) (R>L); Complex regional pain syndrome type II of upper extremity (Right); Chronic upper extremity pain (Right); 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); Epidural fibrosis;  Musculoskeletal pain; Neurogenic pain; Neuropathic pain; Chronic sacroiliac joint pain (Bilateral) (R>L); Psoriatic arthritis ([place]); Controlled type 2 diabetes mellitus without complication ([place]); Chest pain; Acquired hypothyroidism; Chronic lower extremity pain (Secondary source of pain) (Bilateral) (L>R); Psoriasis with arthropathy ([place]); Hypothyroidism; Numbness and tingling of both legs; Abnormal nerve conduction studies (06/20/16); Chronic pain syndrome; Hypotestosteronism; Nausea without vomiting; and Acute postoperative pain on his problem list. His primarily concern today is the Back Pain (low); Hip Pain (bilateral); Knee Pain (bilateral); Foot Pain (bilateral); and Neck Pain  Pain Assessment: Location: Lower, Left, Right Back Radiating: hips, knees ,feet Onset: More than a month ago Duration: Chronic pain Quality: Throbbing, Aching, Shooting Severity: 4 /10 (self-reported pain score)  Note: Reported level is compatible with observation.                   When using our objective Pain Scale, levels between 6 and 10/10 are said to belong in an emergency room, as it progressively worsens from a 6/10, described as severely limiting, requiring emergency care not usually available at an outpatient pain management facility. At a 6/10 level, communication becomes difficult and requires great effort. Assistance to reach the emergency department may be required. Facial flushing and profuse sweating along with potentially dangerous increases in heart rate and blood pressure will be evident. Effect on ADL:   Timing: Constant Modifying factors: medications, rest, positioning  James [name] was last scheduled for an appointment on 05/30/2017 for medication management. During today's appointment we reviewed James [name] chronic pain status, as well as his outpatient medication regimen. He stayes taht he has an episode on the left side of his spin 3 weeks after the right lumbar RFA. He states that he has  occasional zapping pain that goes down on both sides into his toes. He states that this only last a few secs. He feels  Like th is resloving .l  The patient  reports that he does not use drugs. His body mass index is 35.26 kg/m.  Further details on both, my assessment(s), as well as the proposed treatment plan, please see below.  Controlled Substance Pharmacotherapy Assessment REMS (Risk Evaluation and Mitigation Strategy)  Analgesic: *** MME/day: *** mg/day.  Hart Rochester, RN  08/29/2017  9:54 AM  Sign at close encounter Nursing Pain Medication Assessment:  Safety precautions to be maintained throughout the outpatient stay will include: orient to surroundings, keep bed in low position, maintain call bell within reach at all times, provide assistance with transfer out of bed and ambulation.  Medication Inspection Compliance: Pill count conducted under aseptic conditions, in front of the patient. Neither the pills nor the bottle was removed from the patient's sight at any time. Once count was completed pills were immediately returned to the patient in their original bottle.  Medication #1: Oxycodone IR Pill/Patch Count: 18 of 90 pills remain Pill/Patch Appearance: Markings consistent with prescribed medication Bottle Appearance: Standard pharmacy container. Clearly labeled. Filled Date: 53 / 01 / 2018 Last Medication intake:  Today  Medication #2: Fentanyl patch Pill/Patch Count: 2 of 5 patches remain Pill/Patch Appearance: Markings consistent with prescribed medication Bottle Appearance: Standard pharmacy container. Clearly labeled. Filled Date: 27 / 01 / 2018 Last Medication intake:  Today Pharmacokinetics: Liberation and absorption (onset of action): WNL Distribution (time to peak effect): WNL Metabolism and excretion (duration of action): WNL         Pharmacodynamics: Desired effects: Analgesia: James [name] reports >50% benefit. Functional ability: Patient reports that  medication allows him to accomplish basic ADLs Clinically meaningful improvement in function (CMIF): Sustained CMIF goals met Perceived effectiveness: Described as relatively effective, allowing for increase in activities of daily living (ADL) Undesirable effects: Side-effects or Adverse reactions: None reported Monitoring: [place] PMP: Online review of the past 54-monthperiod conducted. Compliant with practice rules and regulations Last UDS on record: No results found for: SUMMARY UDS interpretation: Compliant          Medication Assessment Form: Reviewed. Patient indicates being compliant with therapy Treatment compliance: Compliant Risk Assessment Profile: Aberrant behavior: See prior evaluations. None observed or detected today Comorbid factors increasing risk of overdose: See prior notes. No additional risks detected today Risk of substance use disorder (SUD): Low     Opioid Risk Tool - 08/29/17 1002      Family History of Substance Abuse   Alcohol Negative   Illegal Drugs Negative   Rx Drugs Negative     Personal History of Substance Abuse   Alcohol Negative   Illegal Drugs Negative   Rx Drugs Negative     Age   Age between 161-45years  No     History of Preadolescent Sexual Abuse   History of Preadolescent Sexual Abuse Negative or Male     Psychological Disease   Psychological Disease Negative   Depression Negative     Total Score   Opioid Risk Tool Scoring 0   Opioid Risk Interpretation Low Risk     ORT Scoring interpretation table:  Score <3 = Low Risk for SUD  Score between 4-7 = Moderate Risk for SUD  Score >8 = High Risk for Opioid Abuse   Risk Mitigation Strategies:  Patient Counseling: Covered Patient-Prescriber Agreement (PPA): Present and active  Notification to other healthcare providers: Done  Pharmacologic Plan: No change in therapy, at this time  Laboratory Chemistry  Inflammation Markers (CRP: Acute Phase) (ESR: Chronic Phase) Lab Results  Component Value Date   CRP <0.80 12/06/2016   ESRSEDRATE 9 12/06/2016                 Renal Function Markers Lab Results  Component Value Date   BUN 14 12/06/2016   CREATININE 1.26 (H) 12/06/2016   GFRAA >60 12/06/2016   GFRNONAA >60 12/06/2016                 Hepatic Function Markers Lab Results  Component Value Date   AST 35 12/06/2016   ALT 33 12/06/2016   ALBUMIN 4.4 12/06/2016   ALKPHOS 47 12/06/2016                 Electrolytes Lab Results  Component Value Date   NA 139 12/06/2016   K 4.6 12/06/2016   CL 103 12/06/2016   CALCIUM 9.6 12/06/2016   MG 2.2 12/06/2016                 Neuropathy Markers Lab Results  Component Value Date   VITAMINB12 423 12/06/2016                 Bone Pathology Markers Lab Results  Component Value Date   ALKPHOS 47 12/06/2016   25OHVITD1 42 12/06/2016   25OHVITD2 <1.0 12/06/2016   25OHVITD3 42 12/06/2016   CALCIUM 9.6 12/06/2016                 Coagulation Parameters No results found for: INR, LABPROT, APTT, PLT               Cardiovascular Markers No results found for: BNP, HGB, HCT               Note: Lab results reviewed.  Recent Diagnostic Imaging Results  DG C-Arm 1-60 Min-No Report Fluoroscopy was utilized by the requesting physician.  No radiographic  interpretation.   Complexity Note: Imaging results reviewed. Results shared with James [name], using Layman's terms.                         Meds   Current Outpatient Prescriptions:  .  albuterol (PROAIR HFA) 108 (90 BASE) MCG/ACT inhaler, Inhale 2 puffs into the lungs every 4 (four) hours as needed for wheezing or shortness of breath. , Disp: , Rfl:  .  azelastine (ASTELIN) 0.1 % nasal spray, Place 1 spray into both nostrils 2 (two) times daily. , Disp: , Rfl:  .  budesonide-formoterol (SYMBICORT) 80-4.5 MCG/ACT inhaler, Inhale 2 puffs into the lungs 2 (two) times daily., Disp: , Rfl:  .  fentaNYL (DURAGESIC - DOSED MCG/HR) 12 MCG/HR, Place 1 patch (12.5  mcg total) onto the skin every 3 (three) days., Disp: 10 patch, Rfl: 0 .  fentaNYL (DURAGESIC - DOSED MCG/HR) 12 MCG/HR, Place 1 patch (12.5 mcg total) onto the skin every 3 (three) days., Disp: 10 patch, Rfl: 0 .  fentaNYL (DURAGESIC - DOSED MCG/HR) 12 MCG/HR, Place 1 patch (12.5 mcg total) onto the skin every 3 (three) days., Disp: 10 patch, Rfl: 0 .  Loratadine 10 MG CAPS, Take 10 mg by mouth daily. , Disp: , Rfl:  .  methotrexate 50 MG/2ML injection, Inject 50 mg/m2 as directed once a week. , Disp: , Rfl:  .  metoprolol (LOPRESSOR) 100 MG tablet, TAKE 1 TABLET BY MOUTH TWICE A DAY, Disp: , Rfl:  .  Oxycodone HCl 20 MG TABS, Take 1 tablet (20 mg total) by mouth every 8 (eight) hours.,  Disp: 90 tablet, Rfl: 0 .  Oxycodone HCl 20 MG TABS, Take 1 tablet (20 mg total) by mouth every 8 (eight) hours as needed., Disp: 90 tablet, Rfl: 0 .  Oxycodone HCl 20 MG TABS, Take 1 tablet (20 mg total) by mouth every 8 (eight) hours as needed., Disp: 90 tablet, Rfl: 0 .  sucralfate (CARAFATE) 1 G tablet, Take 1 g by mouth 4 (four) times daily. , Disp: , Rfl:  .  metoprolol tartrate (LOPRESSOR) 100 MG tablet, Take by mouth., Disp: , Rfl:  .  oxyCODONE-acetaminophen (PERCOCET) 5-325 MG tablet, Take 1-2 tablets by mouth every 6 (six) hours as needed for severe pain., Disp: 80 tablet, Rfl: 0 .  polyethylene glycol powder (GLYCOLAX/MIRALAX) powder, Take as directed for colonic prep., Disp: , Rfl:  .  promethazine (PHENERGAN) 12.5 MG tablet, Take by mouth every 4 (four) hours as needed. , Disp: , Rfl:  .  temazepam (RESTORIL) 15 MG capsule, Take 15 mg by mouth at bedtime as needed for sleep., Disp: , Rfl:  .  tiZANidine (ZANAFLEX) 4 MG capsule, Take 4 mg by mouth 3 (three) times daily as needed for muscle spasms., Disp: , Rfl:   ROS  Constitutional: Denies any fever or chills Gastrointestinal: No reported hemesis, hematochezia, vomiting, or acute GI distress Musculoskeletal: Denies any acute onset joint swelling,  redness, loss of ROM, or weakness Neurological: No reported episodes of acute onset apraxia, aphasia, dysarthria, agnosia, amnesia, paralysis, loss of coordination, or loss of consciousness  Allergies  James [name] is allergic to penicillin g; penicillins; lactose; nsaids; prednisone; procaine; sulfasalazine; talwin [pentazocine]; codeine; and sulfa antibiotics.  [place]  Drug: James [name]  reports that he does not use drugs. Alcohol:  reports that he does not drink alcohol. Tobacco:  reports that he has quit smoking. His smoking use included Cigarettes. His smokeless tobacco use includes Chew. 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 Surgical: James [name]  has a past surgical history that includes Spine surgery; Eye surgery; Fracture surgery (Right); Fracture surgery (Bilateral); Fracture surgery (Bilateral); Joint replacement (Right); Spinal cord stimulator implant; Spinal cord stimulator removal; Patella arthroplasty; Shoulder arthroscopy (Bilateral); Esophagogastroduodenoscopy (egd) with propofol (N/A, 01/20/2016); and Colonoscopy (2009). Family: family history includes Arthritis in his father and mother; COPD in his father; Cancer in his father and mother; Diabetes in his mother; Hypertension in his father and mother; Stroke in his mother.  Constitutional Exam  General appearance: Well nourished, well developed, and well hydrated. In no apparent acute distress Vitals:   08/29/17 0954  BP: 124/75  Pulse: 64   Resp: 18  Temp: 98 F (36.7 C)  TempSrc: Oral  SpO2: 98%  Weight: 260 lb (117.9 kg)  Height: 6' (1.829 m)   BMI Assessment: Estimated body mass index is 35.26 kg/m as calculated from the following:   Height as of this encounter: 6' (1.829 m).   Weight as of this encounter: 260 lb (117.9 kg).  BMI interpretation table: BMI level Category Range association with higher incidence of chronic pain  <18 kg/m2 Underweight   18.5-24.9 kg/m2 Ideal body weight   25-29.9 kg/m2 Overweight Increased incidence by 20%  30-34.9 kg/m2 Obese (Class I)  Increased incidence by 68%  35-39.9 kg/m2 Severe obesity (Class II) Increased incidence by 136%  >40 kg/m2 Extreme obesity (Class III) Increased incidence by 254%   BMI Readings from Last 4 Encounters:  08/29/17 35.26 kg/m  07/18/17 34.58 kg/m  05/30/17 33.91 kg/m  05/02/17 33.63 kg/m   Wt Readings from Last 4 Encounters:  08/29/17 260 lb (117.9 kg)  07/18/17 255 lb (115.7 kg)  05/30/17 250 lb (113.4 kg)  05/02/17 248 lb (112.5 kg)  Psych/Mental status: Alert, oriented x 3 (person, place, & time)       Eyes: PERLA Respiratory: No evidence of acute respiratory distress  Cervical Spine Area Exam  Skin & Axial Inspection: No masses, redness, edema, swelling, or associated skin lesions Alignment: Symmetrical Functional ROM: Unrestricted ROM      Stability: No instability detected Muscle Tone/Strength: Functionally intact. No obvious neuro-muscular anomalies detected. Sensory (Neurological): Unimpaired Palpation: No palpable anomalies              Upper Extremity (UE) Exam    Side: Right upper extremity  Side: Left upper extremity  Skin & Extremity Inspection: Skin color, temperature, and hair growth are WNL. No peripheral edema or cyanosis. No masses, redness, swelling, asymmetry, or associated skin lesions. No contractures.  Skin & Extremity Inspection: Skin color, temperature, and hair growth are WNL. No peripheral edema or cyanosis. No  masses, redness, swelling, asymmetry, or associated skin lesions. No contractures.  Functional ROM: Unrestricted ROM          Functional ROM: Unrestricted ROM          Muscle Tone/Strength: Functionally intact. No obvious neuro-muscular anomalies detected.  Muscle Tone/Strength: Functionally intact. No obvious neuro-muscular anomalies detected.  Sensory (Neurological): Unimpaired          Sensory (Neurological): Unimpaired          Palpation: No palpable anomalies              Palpation: No palpable anomalies              Specialized Test(s): Deferred         Specialized Test(s): Deferred          Thoracic Spine Area Exam  Skin & Axial Inspection: No masses, redness, or swelling Alignment: Symmetrical Functional ROM: Unrestricted ROM Stability: No instability detected Muscle Tone/Strength: Functionally intact. No obvious neuro-muscular anomalies detected. Sensory (Neurological): Unimpaired Muscle strength & Tone: No palpable anomalies  Lumbar Spine Area Exam  Skin & Axial Inspection: No masses, redness, or swelling Alignment: Symmetrical Functional ROM: Unrestricted ROM      Stability: No instability detected Muscle Tone/Strength: Functionally intact. No obvious neuro-muscular anomalies detected. Sensory (Neurological): Unimpaired Palpation: No palpable anomalies       Provocative Tests: Lumbar Hyperextension and rotation test: evaluation deferred today       Lumbar Lateral bending test: evaluation deferred today       Patrick's Maneuver: evaluation deferred today                    Gait & Posture Assessment  Ambulation: Unassisted Gait: Relatively normal for age and body habitus Posture: WNL   Lower Extremity Exam    Side: Right lower extremity  Side: Left lower extremity  Skin & Extremity Inspection: Skin color, temperature, and hair growth are WNL. No peripheral edema or cyanosis. No masses, redness, swelling, asymmetry, or associated skin lesions. No contractures.  Skin &  Extremity Inspection: Skin color, temperature, and hair growth are WNL. No  peripheral edema or cyanosis. No masses, redness, swelling, asymmetry, or associated skin lesions. No contractures.  Functional ROM: Unrestricted ROM          Functional ROM: Unrestricted ROM          Muscle Tone/Strength: Functionally intact. No obvious neuro-muscular anomalies detected.  Muscle Tone/Strength: Functionally intact. No obvious neuro-muscular anomalies detected.  Sensory (Neurological): Unimpaired  Sensory (Neurological): Unimpaired  Palpation: No palpable anomalies  Palpation: No palpable anomalies   Assessment  Primary Diagnosis & Pertinent Problem List: There were no encounter diagnoses.  Status Diagnosis  Controlled Controlled Controlled No diagnosis found.  Problems updated and reviewed during this visit: No problems updated. Plan of Care  Pharmacotherapy (Medications Ordered): No orders of the defined types were placed in this encounter.  New Prescriptions   No medications on file   Medications administered today: James [name] had no medications administered during this visit. Lab-work, procedure(s), and/or referral(s): No orders of the defined types were placed in this encounter.  Imaging and/or referral(s): None  Interventional therapies: Planned, scheduled, and/or pending:   Not at this time.   Considering:   ***   Palliative PRN treatment(s):   ***   Provider-requested follow-up: No Follow-up on file.  No future appointments. Primary Care Physician: [name], MD Location: [facility] Outpatient Pain Management Facility Note by: Vevelyn Francois NP Date: 08/29/2017; Time: 10:42 AM  Pain Score Disclaimer: We use the NRS-11 scale. This is a self-reported, subjective measurement of pain severity with only modest accuracy. It is used primarily to identify changes within a particular patient. It must be understood that outpatient pain scales are significantly less accurate  that those used for research, where they can be applied under ideal controlled circumstances with minimal exposure to variables. In reality, the score is likely to be a combination of pain intensity and pain affect, where pain affect describes the degree of emotional arousal or changes in action readiness caused by the sensory experience of pain. Factors such as social and work situation, setting, emotional state, anxiety levels, expectation, and prior pain experience may influence pain perception and show large inter-individual differences that may also be affected by time variables.  Patient instructions provided during this appointment: There are no Patient Instructions on file for this visit.

## 2017-08-29 NOTE — Progress Notes (Signed)
Patient's Name: James [name]  MRN: [id number]  Referring Provider: [name], MD  DOB: September 24, 1956  PCP: [name], MD  DOS: 08/29/2017  Note by: Vevelyn Francois NP  Service setting: Ambulatory outpatient  Specialty: Interventional Pain Management  Location: [facility] (AMB) Pain Management Facility    Patient type: Established    Primary Reason(s) for Visit: Encounter for prescription drug management & post-procedure evaluation of chronic illness with mild to moderate exacerbation(Level of risk: moderate) CC: Back Pain (low); Hip Pain (bilateral); Knee Pain (bilateral); Foot Pain (bilateral); and Neck Pain  HPI  James [name] is a 61 y.o. year old, male patient, who comes today for a post-procedure evaluation and medication management. He has Airway hyperreactivity; Atrial fibrillation ([place]); Narrowing of intervertebral disc space; Abnormal echocardiogram; Gastric ulcer; Gastric catarrh; Acid reflux; Awareness of heartbeats; Hemorrhoid; Hepatitis; BP (high blood pressure); Lactose intolerance; Osteopenia; APC (atrial premature contractions); Apnea, sleep; Supraventricular tachycardia ([place]); Long term current use of opiate analgesic; Long term prescription opiate use; Opiate use (120 MME/Day); Opiate dependence ([place]); Encounter for therapeutic drug level monitoring; Chronic low back pain (Primary Source of Pain) (Bilateral) (R>L); Lumbar spondylosis; Chronic neck pain (Right); Cervical spondylosis (C7 intravertebral body cyst); Chronic cervical radicular pain (Right); Chronic lumbar radicular pain (Bilateral) (L>R) (L5 Dermatome); Osteoarthritis; Chronic hip pain; Chronic knee pain (Bilateral) (R>L); Complex regional pain syndrome type II of upper extremity (Right); Chronic upper extremity pain (Right); 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); Epidural fibrosis; Musculoskeletal pain; Neurogenic pain; Neuropathic pain; Chronic sacroiliac joint pain (Bilateral) (R>L); Psoriatic arthritis ([place]); Controlled type 2 diabetes mellitus without complication ([place]); Chest pain; Acquired hypothyroidism; Chronic lower extremity pain (Secondary source of pain) (Bilateral) (L>R); Psoriasis with arthropathy ([place]); Hypothyroidism; Numbness and tingling of both legs; Abnormal nerve conduction studies (06/20/16); Chronic pain syndrome; Hypotestosteronism; Nausea without vomiting; and Acute postoperative pain on his problem list. His primarily concern today is the Back Pain (low); Hip Pain (bilateral); Knee Pain (bilateral); Foot Pain (bilateral); and Neck Pain  Pain Assessment: Location: Lower, Left, Right Back Radiating: hips, knees ,feet Onset: More than a month ago Duration: Chronic pain Quality: Throbbing, Aching, Shooting Severity: 4 /10 (self-reported pain score)  Note: Reported level is compatible with observation.                   When using our objective Pain Scale, levels between 6 and 10/10 are said to belong in an emergency room, as it progressively worsens from a 6/10, described as severely limiting, requiring emergency care not usually available at an outpatient pain management facility. At a 6/10 level, communication becomes difficult and requires great effort. Assistance to reach the emergency department may be required. Facial flushing and profuse sweating along with potentially dangerous increases in heart rate and blood pressure will be evident. Effect on ADL:   Timing: Constant Modifying factors: medications, rest, positioning  James [name] was last seen on 05/30/2017 for a procedure. During today's appointment we reviewed James [name]'s post-procedure results, as well as his outpatient medication regimen. He states that he had an episode on the left side about 3 weeks  after the right lumbar RFA. He states that he has occasional zapping pain that goes down on both sides into his toes. He admits that this only last a few secs. He feels like this is resolving. He  denies any additional concerns today.   Further details on both, my assessment(s), as well as the proposed treatment plan, please see below.  Controlled Substance Pharmacotherapy Assessment REMS (Risk Evaluation and Mitigation Strategy)  Analgesic:Fentanyl patch 12.5 mcg/h every 72 hours + oxycodone IR 20 mg every 6 hours (60 mg/day) MME/day:120 mg/day  Hart [place], RN  08/29/2017  9:54 AM  Sign at close encounter Nursing Pain Medication Assessment:  Safety precautions to be maintained throughout the outpatient stay will include: orient to surroundings, keep bed in low position, maintain call bell within reach at all times, provide assistance with transfer out of bed and ambulation.  Medication Inspection Compliance: Pill count conducted under aseptic conditions, in front of the patient. Neither the pills nor the bottle was removed from the patient's sight at any time. Once count was completed pills were immediately returned to the patient in their original bottle.  Medication #1: Oxycodone IR Pill/Patch Count: 18 of 90 pills remain Pill/Patch Appearance: Markings consistent with prescribed medication Bottle Appearance: Standard pharmacy container. Clearly labeled. Filled Date: 49 / 01 / 2018 Last Medication intake:  Today  Medication #2: Fentanyl patch Pill/Patch Count: 2 of 5 patches remain Pill/Patch Appearance: Markings consistent with prescribed medication Bottle Appearance: Standard pharmacy container. Clearly labeled. Filled Date: 57 / 01 / 2018 Last Medication intake:  Today   Pharmacokinetics: Liberation and absorption (onset of action): WNL Distribution (time to peak effect): WNL Metabolism and excretion (duration of action): WNL         Pharmacodynamics: Desired  effects: Analgesia: James [name] reports >50% benefit. Functional ability: Patient reports that medication allows him to accomplish basic ADLs Clinically meaningful improvement in function (CMIF): Sustained CMIF goals met Perceived effectiveness: Described as relatively effective, allowing for increase in activities of daily living (ADL) Undesirable effects: Side-effects or Adverse reactions: None reported Monitoring: [place] PMP: Online review of the past 3-monthperiod conducted. Compliant with practice rules and regulations Last UDS on record: No results found for: SUMMARY UDS interpretation: Compliant          Medication Assessment Form: Reviewed. Patient indicates being compliant with therapy Treatment compliance: Compliant Risk Assessment Profile: Aberrant behavior: See prior evaluations. None observed or detected today Comorbid factors increasing risk of overdose: See prior notes. No additional risks detected today Risk of substance use disorder (SUD): Low     Opioid Risk Tool - 08/29/17 1002      Family History of Substance Abuse   Alcohol Negative   Illegal Drugs Negative   Rx Drugs Negative     Personal History of Substance Abuse   Alcohol Negative   Illegal Drugs Negative   Rx Drugs Negative     Age   Age between 169-45years  No     History of Preadolescent Sexual Abuse   History of Preadolescent Sexual Abuse Negative or Male     Psychological Disease   Psychological Disease Negative   Depression Negative     Total Score   Opioid Risk Tool Scoring 0   Opioid Risk Interpretation Low Risk     ORT Scoring interpretation table:  Score <3 = Low Risk for SUD  Score between 4-7 = Moderate Risk for SUD  Score >8 = High Risk for Opioid Abuse   Risk Mitigation Strategies:  Patient Counseling: Covered Patient-Prescriber Agreement (PPA): Present and active  Notification to other healthcare providers: Done  Pharmacologic Plan: No change in therapy, at this  time  Post-Procedure Assessment  07/18/2017 Procedure: Lumbar  Facet RFA Pre-procedure pain score:  4/10 Post-procedure pain score: 6/10         Influential Factors: BMI: 35.26 kg/m Intra-procedural challenges: None observed.         Assessment challenges: None detected.              Reported side-effects: None.        Post-procedural adverse reactions or complications: None reported         Sedation: Please see nurses note. When no sedatives are used, the analgesic levels obtained are directly associated to the effectiveness of the local anesthetics. However, when sedation is provided, the level of analgesia obtained during the initial 1 hour following the intervention, is believed to be the result of a combination of factors. These factors may include, but are not limited to: 1. The effectiveness of the local anesthetics used. 2. The effects of the analgesic(s) and/or anxiolytic(s) used. 3. The degree of discomfort experienced by the patient at the time of the procedure. 4. The patients ability and reliability in recalling and recording the events. 5. The presence and influence of possible secondary gains and/or psychosocial factors. Reported result: Relief experienced during the 1st hour after the procedure: 90 % (Ultra-Short Term Relief)            Interpretative annotation: Clinically appropriate result. Analgesia during this period is likely to be Local Anesthetic and/or IV Sedative (Analgesic/Anxiolytic) related.          Effects of local anesthetic: The analgesic effects attained during this period are directly associated to the localized infiltration of local anesthetics and therefore cary significant diagnostic value as to the etiological location, or anatomical origin, of the pain. Expected duration of relief is directly dependent on the pharmacodynamics of the local anesthetic used. Long-acting (4-6 hours) anesthetics used.  Reported result: Relief during the next 4 to 6 hour after  the procedure: 90 % (Short-Term Relief)            Interpretative annotation: Clinically appropriate result. Analgesia during this period is likely to be Local Anesthetic-related.          Long-term benefit: Defined as the period of time past the expected duration of local anesthetics (1 hour for short-acting and 4-6 hours for long-acting). With the possible exception of prolonged sympathetic blockade from the local anesthetics, benefits during this period are typically attributed to, or associated with, other factors such as analgesic sensory neuropraxia, antiinflammatory effects, or beneficial biochemical changes provided by agents other than the local anesthetics.  Reported result: Extended relief following procedure: 90 % (Long-Term Relief)            Interpretative annotation: Clinically appropriate result. Good relief. No permanent benefit expected. Inflammation plays a part in the etiology to the pain.          Current benefits: Defined as reported results that persistent at this point in time.   Analgesia: 75-100 %            Function: James [name] reports improvement in function ROM: James [name] reports improvement in ROM Interpretative annotation: Ongoing benefit. Therapeutic benefit observed. Adequate RF ablation.          Interpretation: Results would suggest adequate radiofrequency ablation.                  Plan:  Please see "Plan of Care" for details.        Laboratory Chemistry  Inflammation Markers (CRP: Acute Phase) (ESR: Chronic Phase) Lab Results  Component Value Date   CRP <0.80 12/06/2016   ESRSEDRATE 9 12/06/2016                 Renal Function Markers Lab Results  Component Value Date   BUN 14 12/06/2016   CREATININE 1.26 (H) 12/06/2016   GFRAA >60 12/06/2016   GFRNONAA >60 12/06/2016                 Hepatic Function Markers Lab Results  Component Value Date   AST 35 12/06/2016   ALT 33 12/06/2016   ALBUMIN 4.4 12/06/2016   ALKPHOS 47 12/06/2016                  Electrolytes Lab Results  Component Value Date   NA 139 12/06/2016   K 4.6 12/06/2016   CL 103 12/06/2016   CALCIUM 9.6 12/06/2016   MG 2.2 12/06/2016                 Neuropathy Markers Lab Results  Component Value Date   VITAMINB12 423 12/06/2016                 Bone Pathology Markers Lab Results  Component Value Date   ALKPHOS 47 12/06/2016   25OHVITD1 42 12/06/2016   25OHVITD2 <1.0 12/06/2016   25OHVITD3 42 12/06/2016   CALCIUM 9.6 12/06/2016                 Coagulation Parameters No results found for: INR, LABPROT, APTT, PLT               Cardiovascular Markers No results found for: BNP, HGB, HCT               Note: Lab results reviewed.  Recent Diagnostic Imaging Results  DG C-Arm 1-60 Min-No Report Fluoroscopy was utilized by the requesting physician.  No radiographic  interpretation.   Complexity Note: Imaging results reviewed. Results shared with James [name], using Layman's terms.                         Meds   Current Outpatient Prescriptions:  .  albuterol (PROAIR HFA) 108 (90 BASE) MCG/ACT inhaler, Inhale 2 puffs into the lungs every 4 (four) hours as needed for wheezing or shortness of breath. , Disp: , Rfl:  .  azelastine (ASTELIN) 0.1 % nasal spray, Place 1 spray into both nostrils 2 (two) times daily. , Disp: , Rfl:  .  budesonide-formoterol (SYMBICORT) 80-4.5 MCG/ACT inhaler, Inhale 2 puffs into the lungs 2 (two) times daily., Disp: , Rfl:  .  fentaNYL (DURAGESIC - DOSED MCG/HR) 12 MCG/HR, Place 1 patch (12.5 mcg total) onto the skin every 3 (three) days., Disp: 10 patch, Rfl: 0 .  [START ON 10/04/2017] fentaNYL (DURAGESIC - DOSED MCG/HR) 12 MCG/HR, Place 1 patch (12.5 mcg total) onto the skin every 3 (three) days., Disp: 10 patch, Rfl: 0 .  [START ON 11/03/2017] fentaNYL (DURAGESIC - DOSED MCG/HR) 12 MCG/HR, Place 1 patch (12.5 mcg total) onto the skin every 3 (three) days., Disp: 10 patch, Rfl: 0 .  Loratadine 10 MG CAPS, Take 10 mg by  mouth daily. , Disp: , Rfl:  .  methotrexate 50 MG/2ML injection, Inject 50 mg/m2 as directed once a week. , Disp: , Rfl:  .  metoprolol (LOPRESSOR) 100 MG tablet, TAKE 1 TABLET BY MOUTH TWICE A DAY, Disp: , Rfl:  .  Oxycodone HCl 20 MG TABS, Take 1 tablet (20 mg  total) by mouth every 8 (eight) hours as needed., Disp: 90 tablet, Rfl: 0 .  [START ON 10/04/2017] Oxycodone HCl 20 MG TABS, Take 1 tablet (20 mg total) by mouth every 8 (eight) hours., Disp: 90 tablet, Rfl: 0 .  [START ON 11/03/2017] Oxycodone HCl 20 MG TABS, Take 1 tablet (20 mg total) by mouth every 8 (eight) hours as needed., Disp: 90 tablet, Rfl: 0 .  sucralfate (CARAFATE) 1 G tablet, Take 1 g by mouth 4 (four) times daily. , Disp: , Rfl:  .  metoprolol tartrate (LOPRESSOR) 100 MG tablet, Take by mouth., Disp: , Rfl:  .  polyethylene glycol powder (GLYCOLAX/MIRALAX) powder, Take as directed for colonic prep., Disp: , Rfl:  .  promethazine (PHENERGAN) 12.5 MG tablet, Take by mouth every 4 (four) hours as needed. , Disp: , Rfl:  .  temazepam (RESTORIL) 15 MG capsule, Take 15 mg by mouth at bedtime as needed for sleep., Disp: , Rfl:  .  tiZANidine (ZANAFLEX) 4 MG capsule, Take 4 mg by mouth 3 (three) times daily as needed for muscle spasms., Disp: , Rfl:   ROS  Constitutional: Denies any fever or chills Gastrointestinal: No reported hemesis, hematochezia, vomiting, or acute GI distress Musculoskeletal: Denies any acute onset joint swelling, redness, loss of ROM, or weakness Neurological: No reported episodes of acute onset apraxia, aphasia, dysarthria, agnosia, amnesia, paralysis, loss of coordination, or loss of consciousness  Allergies  James [name] is allergic to penicillin g; penicillins; lactose; nsaids; prednisone; procaine; sulfasalazine; talwin [pentazocine]; codeine; and sulfa antibiotics.  [place]  Drug: James [name]  reports that he does not use drugs. Alcohol:  reports that he does not drink alcohol. Tobacco:  reports  that he has quit smoking. His smoking use included Cigarettes. His smokeless tobacco use includes Chew. 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 Surgical: James [name]  has a past surgical history that includes Spine surgery; Eye surgery; Fracture surgery (Right); Fracture surgery (Bilateral); Fracture surgery (Bilateral); Joint replacement (Right); Spinal cord stimulator implant; Spinal cord stimulator removal; Patella arthroplasty; Shoulder arthroscopy (Bilateral); Esophagogastroduodenoscopy (egd) with propofol (N/A, 01/20/2016); and Colonoscopy (2009). Family: family history includes Arthritis in his father and mother; COPD in his father; Cancer in his father and mother; Diabetes in his mother; Hypertension in his father and mother; Stroke in his mother.  Constitutional Exam  General appearance: alert, cooperative, in no distress and mildly obese Vitals:   08/29/17 0954  BP: 124/75  Pulse: 64  Resp: 18  Temp: 98 F (36.7 C)  TempSrc: Oral  SpO2: 98%  Weight: 260 lb (117.9 kg)  Height: 6' (1.829 m)   BMI Assessment: Estimated body mass index is 35.26 kg/m as calculated from the following:   Height as of this encounter: 6' (1.829 m).   Weight as of this encounter: 260 lb (117.9 kg).  BMI interpretation table: BMI level Category Range association with higher incidence of chronic pain  <18 kg/m2 Underweight   18.5-24.9 kg/m2 Ideal body weight   25-29.9 kg/m2 Overweight Increased incidence by 20%   30-34.9 kg/m2 Obese (Class I) Increased incidence by 68%  35-39.9 kg/m2 Severe obesity (Class II) Increased incidence by 136%  >40  kg/m2 Extreme obesity (Class III) Increased incidence by 254%   BMI Readings from Last 4 Encounters:  08/29/17 35.26 kg/m  07/18/17 34.58 kg/m  05/30/17 33.91 kg/m  05/02/17 33.63 kg/m   Wt Readings from Last 4 Encounters:  08/29/17 260 lb (117.9 kg)  07/18/17 255 lb (115.7 kg)  05/30/17 250 lb (113.4 kg)  05/02/17 248 lb (112.5 kg)  Psych/Mental status: Alert, oriented x 3 (person, place, & time)       Eyes: PERLA Respiratory: No evidence of acute respiratory distress  Cervical Spine Area Exam  Skin & Axial Inspection: No masses, redness, edema, swelling, or associated skin lesions Alignment: Symmetrical Functional ROM: Unrestricted ROM      Stability: No instability detected Muscle Tone/Strength: Functionally intact. No obvious neuro-muscular anomalies detected. Sensory (Neurological): Unimpaired Palpation: No palpable anomalies              Upper Extremity (UE) Exam    Side: Right upper extremity  Side: Left upper extremity  Skin & Extremity Inspection: Skin color, temperature, and hair growth are WNL. No peripheral edema or cyanosis. No masses, redness, swelling, asymmetry, or associated skin lesions. No contractures.  Skin & Extremity Inspection: Skin color, temperature, and hair growth are WNL. No peripheral edema or cyanosis. No masses, redness, swelling, asymmetry, or associated skin lesions. No contractures.  Functional ROM: Unrestricted ROM          Functional ROM: Unrestricted ROM          Muscle Tone/Strength: Functionally intact. No obvious neuro-muscular anomalies detected.  Muscle Tone/Strength: Functionally intact. No obvious neuro-muscular anomalies detected.  Sensory (Neurological): Unimpaired          Sensory (Neurological): Unimpaired          Palpation: No palpable anomalies              Palpation: No palpable anomalies               Specialized Test(s): Deferred         Specialized Test(s): Deferred          Thoracic Spine Area Exam  Skin & Axial Inspection: No masses, redness, or swelling Alignment: Symmetrical Functional ROM: Unrestricted ROM Stability: No instability detected Muscle Tone/Strength: Functionally intact. No obvious neuro-muscular anomalies detected. Sensory (Neurological): Unimpaired Muscle strength & Tone: No palpable anomalies  Lumbar Spine Area Exam  Skin & Axial Inspection: No masses, redness, or swelling Alignment: Symmetrical Functional ROM: Unrestricted ROM      Stability: No instability detected Muscle Tone/Strength: Functionally intact. No obvious neuro-muscular anomalies detected. Sensory (Neurological): Unimpaired Palpation: No palpable anomalies       Provocative Tests: Lumbar Hyperextension and rotation test: evaluation deferred today       Lumbar Lateral bending test: evaluation deferred today       Patrick's Maneuver: evaluation deferred today                    Gait & Posture Assessment  Ambulation: Unassisted Gait: Relatively normal for age and body habitus Posture: WNL   Lower Extremity Exam    Side: Right lower extremity  Side: Left lower extremity  Skin & Extremity Inspection: Skin color, temperature, and hair growth are WNL. No peripheral edema or cyanosis. No masses, redness, swelling, asymmetry, or associated skin lesions. No contractures.  Skin & Extremity Inspection: Skin color, temperature, and hair growth are WNL. No peripheral edema or cyanosis. No masses, redness, swelling, asymmetry, or associated skin lesions. No contractures.  Functional ROM:  Unrestricted ROM          Functional ROM: Unrestricted ROM          Muscle Tone/Strength: Functionally intact. No obvious neuro-muscular anomalies detected.  Muscle Tone/Strength: Functionally intact. No obvious neuro-muscular anomalies detected.  Sensory (Neurological): Unimpaired  Sensory (Neurological): Unimpaired   Palpation: No palpable anomalies  Palpation: No palpable anomalies   Assessment  Primary Diagnosis & Pertinent Problem List: The primary encounter diagnosis was Chronic low back pain (Primary Source of Pain) (Bilateral) (R>L). Diagnoses of Complex regional pain syndrome type II of upper extremity (Right), Chronic neck pain (Right), Chronic pain syndrome, and Long term current use of opiate analgesic were also pertinent to this visit.  Status Diagnosis  Controlled Controlled Controlled 1. Chronic low back pain (Primary Source of Pain) (Bilateral) (R>L)   2. Complex regional pain syndrome type II of upper extremity (Right)   3. Chronic neck pain (Right)   4. Chronic pain syndrome   5. Long term current use of opiate analgesic     Problems updated and reviewed during this visit: No problems updated. Plan of Care  Pharmacotherapy (Medications Ordered): Meds ordered this encounter  Medications  . fentaNYL (DURAGESIC - DOSED MCG/HR) 12 MCG/HR    Sig: Place 1 patch (12.5 mcg total) onto the skin every 3 (three) days.    Dispense:  10 patch    Refill:  0    Do not place this medication, or any other prescription from our practice, on "Automatic Refill". Patient may have prescription filled one day early if pharmacy is closed on scheduled refill date. Do not fill until: 10/04/2017 To last until: 11/03/2017    Order Specific Question:   Supervising Provider    Answer:   Milinda Pointer [phone]  . fentaNYL (DURAGESIC - DOSED MCG/HR) 12 MCG/HR    Sig: Place 1 patch (12.5 mcg total) onto the skin every 3 (three) days.    Dispense:  10 patch    Refill:  0    Do not place this medication, or any other prescription from our practice, on "Automatic Refill". Patient may have prescription filled one day early if pharmacy is closed on scheduled refill date. Do not fill until: 11/03/2017 To last until: 12/03/2017    Order Specific Question:   Supervising Provider    Answer:   Milinda Pointer  [phone]  . Oxycodone HCl 20 MG TABS    Sig: Take 1 tablet (20 mg total) by mouth every 8 (eight) hours.    Dispense:  90 tablet    Refill:  0    Do not place this medication, or any other prescription from our practice, on "Automatic Refill". Patient may have prescription filled one day early if pharmacy is closed on scheduled refill date. Do not fill until:10/04/2017 To last until: 11/03/2017    Order Specific Question:   Supervising Provider    Answer:   Milinda Pointer [phone]  . Oxycodone HCl 20 MG TABS    Sig: Take 1 tablet (20 mg total) by mouth every 8 (eight) hours as needed.    Dispense:  90 tablet    Refill:  0    Do not place this medication, or any other prescription from our practice, on "Automatic Refill". Patient may have prescription filled one day early if pharmacy is closed on scheduled refill date. Do not fill until: 11/03/2017 To last until: 12/03/2017    Order Specific Question:   Supervising Provider    Answer:   Milinda Pointer [  [id number]]   New Prescriptions   No medications on file   Medications administered today: James [name] had no medications administered during this visit. Lab-work, procedure(s), and/or referral(s): Orders Placed This Encounter  Procedures  . ToxASSURE Select 13 (MW), Urine   Imaging and/or referral(s): None  Interventional therapies: Planned, scheduled, and/or pending:   Not at this time. Would like to have patient be considered for tapering down on his medication. He is not using medication regularly.    Considering:  Diagnostic Right-sided cervical epiduralsteroid injection.  Diagnostic Right-sided L4-5 lumbar epiduralsteroid injection.  Diagnostic bilateral lumbar facetblock. Possible bilateral lumbar facet radiofrequencyablation. Diagnostic bilateral cervical facetblock.  Possible bilateral cervical facet radiofrequencyablation.   Palliative PRN treatment(s):  For the arm pain and numbness, right-sided  cervical epidural steroid injection For the lower extremity pain, right-sided L4-5 lumbar epidural steroid injection For the low back pain, diagnostic bilateral lumbar facet block For the neck pain, diagnostic bilateral cervical facet block   Provider-requested follow-up: Return in about 3 months (around 11/29/2017) for MedMgmt, w/ Dr. Dossie Arbour.  Future Appointments Date Time Provider [place]  11/27/2017 8:15 AM Milinda Pointer, MD [facility] None   Primary Care Physician: [name], MD Location: [facility] Note by: Vevelyn Francois NP Date: 08/29/2017; Time: 11:04 AM  Pain Score Disclaimer: We use the NRS-11 scale. This is a self-reported, subjective measurement of pain severity with only modest accuracy. It is used primarily to identify changes within a particular patient. It must be understood that outpatient pain scales are significantly less accurate that those used for research, where they can be applied under ideal controlled circumstances with minimal exposure to variables. In reality, the score is likely to be a combination of pain intensity and pain affect, where pain affect describes the degree of emotional arousal or changes in action readiness caused by the sensory experience of pain. Factors such as social and work situation, setting, emotional state, anxiety levels, expectation, and prior pain experience may influence pain perception and show large inter-individual differences that may also be affected by time variables.  Patient instructions provided during this appointment: Patient Instructions  Yo9u have been given 2 prescriptions for Fentanyl patches and 2 scripts for oxycodone.  ____________________________________________________________________________________________  Medication Rules  Applies to: All patients receiving prescriptions (written or electronic).  Pharmacy of record: Pharmacy where electronic prescriptions  will be sent. If written prescriptions are taken to a different pharmacy, please inform the nursing staff. The pharmacy listed in the electronic medical record should be the one where you would like electronic prescriptions to be sent.  Prescription refills: Only during scheduled appointments. Applies to both, written and electronic prescriptions.  NOTE: The following applies primarily to controlled substances (Opioid* Pain Medications).   Patient's responsibilities: 1. Pain Pills: Bring all pain pills to every appointment (except for procedure appointments). 2. Pill Bottles: Bring pills in original pharmacy bottle. Always bring newest bottle. Bring bottle, even if empty. 3. Medication refills: You are responsible for knowing and keeping track of what medications you need refilled. The day before your appointment, write a list of all prescriptions that need to be refilled. Bring that list to your appointment and give it to the admitting nurse. Prescriptions will be written only during appointments. If you forget a medication, it will not be "Called in", "Faxed", or "electronically sent". You will need to get another appointment to get these prescribed. 4. Prescription Accuracy: You are responsible for carefully inspecting your prescriptions before leaving our office.  Have the discharge nurse carefully go over each prescription with you, before taking them home. Make sure that your name is accurately spelled, that your address is correct. Check the name and dose of your medication to make sure it is accurate. Check the number of pills, and the written instructions to make sure they are clear and accurate. Make sure that you are given enough medication to last until your next medication refill appointment. 5. Taking Medication: Take medication as prescribed. Never take more pills than instructed. Never take medication more frequently than prescribed. Taking less pills or less frequently is permitted and  encouraged, when it comes to controlled substances (written prescriptions).  6. Inform other Doctors: Always inform, all of your healthcare providers, of all the medications you take. 7. Pain Medication from other Providers: You are not allowed to accept any additional pain medication from any other Doctor or Healthcare provider. There are two exceptions to this rule. (see below) In the event that you require additional pain medication, you are responsible for notifying us, as stated below. 8. Medication Agreement: You are responsible for carefully reading and following our Medication Agreement. This must be signed before receiving any prescriptions from our practice. Safely store a copy of your signed Agreement. Violations to the Agreement will result in no further prescriptions. (Additional copies of our Medication Agreement are available upon request.) 9. Laws, Rules, & Regulations: All patients are expected to follow all Federal and [employer], Rules, [employer]. Ignorance of the Laws does not constitute a valid excuse. The use of any illegal substances is prohibited. 10. Adopted CDC guidelines & recommendations: Target dosing levels will be at or below 60 MME/day. Use of benzodiazepines** is not recommended.  Exceptions: There are only two exceptions to the rule of not receiving pain medications from other Healthcare Providers. 1. Exception #1 (Emergencies): In the event of an emergency (i.e.: accident requiring emergency care), you are allowed to receive additional pain medication. However, you are responsible for: As soon as you are able, call our office [phone], at any time of the day or night, and leave a message stating your name, the date and nature of the emergency, and the name and dose of the medication prescribed. In the event that your call is answered by a member of our staff, make sure to document and save the date, time, and the name of the person that took your  information.  2. Exception #2 (Planned Surgery): In the event that you are scheduled by another doctor or dentist to have any type of surgery or procedure, you are allowed (for a period no longer than 30 days), to receive additional pain medication, for the acute post-op pain. However, in this case, you are responsible for picking up a copy of our "Post-op Pain Management for Surgeons" handout, and giving it to your surgeon or dentist. This document is available at our office, and does not require an appointment to obtain it. Simply go to our office during business hours (Monday-Thursday from 8:00 AM to 4:00 PM) (Friday 8:00 AM to 12:00 Noon) or if you have a scheduled appointment with [place], prior to your surgery, and ask for it by name. In addition, you will need to provide [place] with your name, name of your surgeon, type of surgery, and date of procedure or surgery.  *Opioid medications include: morphine, codeine, oxycodone, oxymorphone, hydrocodone, hydromorphone, meperidine, tramadol, tapentadol, buprenorphine, fentanyl, methadone. **Benzodiazepine medications include: diazepam (Valium), alprazolam (Xanax), clonazepam (Klonopine), lorazepam (Ativan),  clorazepate (Tranxene), chlordiazepoxide (Librium), estazolam (Prosom), oxazepam (Serax), temazepam (Restoril), triazolam (Halcion)  ____________________________________________________________________________________________

## 2017-08-29 NOTE — Progress Notes (Signed)
Nursing Pain Medication Assessment:  Safety precautions to be maintained throughout the outpatient stay will include: orient to surroundings, keep bed in low position, maintain call bell within reach at all times, provide assistance with transfer out of bed and ambulation.  Medication Inspection Compliance: Pill count conducted under aseptic conditions, in front of the patient. Neither the pills nor the bottle was removed from the patient's sight at any time. Once count was completed pills were immediately returned to the patient in their original bottle.  Medication #1: Oxycodone IR Pill/Patch Count: 18 of 90 pills remain Pill/Patch Appearance: Markings consistent with prescribed medication Bottle Appearance: Standard pharmacy container. Clearly labeled. Filled Date: 100 / 01 / 2018 Last Medication intake:  Today  Medication #2: Fentanyl patch Pill/Patch Count: 2 of 5 patches remain Pill/Patch Appearance: Markings consistent with prescribed medication Bottle Appearance: Standard pharmacy container. Clearly labeled. Filled Date: 51 / 01 / 2018 Last Medication intake:  Today

## 2017-09-04 LAB — TOXASSURE SELECT 13 (MW), URINE

## 2017-09-05 DIAGNOSIS — Z79899 Other long term (current) drug therapy: Secondary | ICD-10-CM | POA: Diagnosis not present

## 2017-09-05 DIAGNOSIS — M1990 Unspecified osteoarthritis, unspecified site: Secondary | ICD-10-CM | POA: Diagnosis not present

## 2017-09-11 DIAGNOSIS — Z79899 Other long term (current) drug therapy: Secondary | ICD-10-CM | POA: Diagnosis not present

## 2017-09-11 DIAGNOSIS — M1990 Unspecified osteoarthritis, unspecified site: Secondary | ICD-10-CM | POA: Diagnosis not present

## 2017-09-11 DIAGNOSIS — L409 Psoriasis, unspecified: Secondary | ICD-10-CM | POA: Diagnosis not present

## 2017-09-11 DIAGNOSIS — M150 Primary generalized (osteo)arthritis: Secondary | ICD-10-CM | POA: Diagnosis not present

## 2017-09-18 DIAGNOSIS — Z0000 Encounter for general adult medical examination without abnormal findings: Secondary | ICD-10-CM | POA: Diagnosis not present

## 2017-09-18 DIAGNOSIS — M8580 Other specified disorders of bone density and structure, unspecified site: Secondary | ICD-10-CM | POA: Diagnosis not present

## 2017-09-18 DIAGNOSIS — K219 Gastro-esophageal reflux disease without esophagitis: Secondary | ICD-10-CM | POA: Diagnosis not present

## 2017-09-18 DIAGNOSIS — Z23 Encounter for immunization: Secondary | ICD-10-CM | POA: Diagnosis not present

## 2017-09-18 DIAGNOSIS — L4050 Arthropathic psoriasis, unspecified: Secondary | ICD-10-CM | POA: Diagnosis not present

## 2017-09-18 DIAGNOSIS — E349 Endocrine disorder, unspecified: Secondary | ICD-10-CM | POA: Diagnosis not present

## 2017-09-18 DIAGNOSIS — I471 Supraventricular tachycardia: Secondary | ICD-10-CM | POA: Diagnosis not present

## 2017-11-27 ENCOUNTER — Ambulatory Visit: Payer: PPO | Attending: Pain Medicine | Admitting: Nurse Practitioner

## 2017-11-27 ENCOUNTER — Encounter: Payer: Self-pay | Admitting: Nurse Practitioner

## 2017-11-27 ENCOUNTER — Other Ambulatory Visit: Payer: Self-pay

## 2017-11-27 VITALS — BP 114/66 | HR 65 | Temp 97.8°F | Resp 16 | Ht 70.0 in | Wt 240.0 lb

## 2017-11-27 DIAGNOSIS — E119 Type 2 diabetes mellitus without complications: Secondary | ICD-10-CM | POA: Insufficient documentation

## 2017-11-27 DIAGNOSIS — E669 Obesity, unspecified: Secondary | ICD-10-CM | POA: Insufficient documentation

## 2017-11-27 DIAGNOSIS — K219 Gastro-esophageal reflux disease without esophagitis: Secondary | ICD-10-CM | POA: Diagnosis not present

## 2017-11-27 DIAGNOSIS — Z833 Family history of diabetes mellitus: Secondary | ICD-10-CM | POA: Diagnosis not present

## 2017-11-27 DIAGNOSIS — Z6834 Body mass index (BMI) 34.0-34.9, adult: Secondary | ICD-10-CM | POA: Insufficient documentation

## 2017-11-27 DIAGNOSIS — I10 Essential (primary) hypertension: Secondary | ICD-10-CM | POA: Insufficient documentation

## 2017-11-27 DIAGNOSIS — J45909 Unspecified asthma, uncomplicated: Secondary | ICD-10-CM | POA: Diagnosis not present

## 2017-11-27 DIAGNOSIS — L4050 Arthropathic psoriasis, unspecified: Secondary | ICD-10-CM | POA: Insufficient documentation

## 2017-11-27 DIAGNOSIS — M25562 Pain in left knee: Secondary | ICD-10-CM | POA: Insufficient documentation

## 2017-11-27 DIAGNOSIS — F329 Major depressive disorder, single episode, unspecified: Secondary | ICD-10-CM | POA: Insufficient documentation

## 2017-11-27 DIAGNOSIS — Z7951 Long term (current) use of inhaled steroids: Secondary | ICD-10-CM | POA: Diagnosis not present

## 2017-11-27 DIAGNOSIS — M5412 Radiculopathy, cervical region: Secondary | ICD-10-CM | POA: Diagnosis not present

## 2017-11-27 DIAGNOSIS — E039 Hypothyroidism, unspecified: Secondary | ICD-10-CM | POA: Insufficient documentation

## 2017-11-27 DIAGNOSIS — Z823 Family history of stroke: Secondary | ICD-10-CM | POA: Insufficient documentation

## 2017-11-27 DIAGNOSIS — Z79899 Other long term (current) drug therapy: Secondary | ICD-10-CM | POA: Insufficient documentation

## 2017-11-27 DIAGNOSIS — G894 Chronic pain syndrome: Secondary | ICD-10-CM

## 2017-11-27 DIAGNOSIS — Z5181 Encounter for therapeutic drug level monitoring: Secondary | ICD-10-CM | POA: Diagnosis not present

## 2017-11-27 DIAGNOSIS — M5416 Radiculopathy, lumbar region: Secondary | ICD-10-CM | POA: Diagnosis not present

## 2017-11-27 DIAGNOSIS — Z885 Allergy status to narcotic agent status: Secondary | ICD-10-CM | POA: Insufficient documentation

## 2017-11-27 DIAGNOSIS — M4722 Other spondylosis with radiculopathy, cervical region: Secondary | ICD-10-CM | POA: Insufficient documentation

## 2017-11-27 DIAGNOSIS — G629 Polyneuropathy, unspecified: Secondary | ICD-10-CM | POA: Insufficient documentation

## 2017-11-27 DIAGNOSIS — Z79891 Long term (current) use of opiate analgesic: Secondary | ICD-10-CM | POA: Insufficient documentation

## 2017-11-27 DIAGNOSIS — Z880 Allergy status to penicillin: Secondary | ICD-10-CM | POA: Insufficient documentation

## 2017-11-27 DIAGNOSIS — Z888 Allergy status to other drugs, medicaments and biological substances status: Secondary | ICD-10-CM | POA: Insufficient documentation

## 2017-11-27 DIAGNOSIS — Z87891 Personal history of nicotine dependence: Secondary | ICD-10-CM | POA: Diagnosis not present

## 2017-11-27 DIAGNOSIS — Z8261 Family history of arthritis: Secondary | ICD-10-CM | POA: Insufficient documentation

## 2017-11-27 DIAGNOSIS — M961 Postlaminectomy syndrome, not elsewhere classified: Secondary | ICD-10-CM | POA: Insufficient documentation

## 2017-11-27 DIAGNOSIS — Z8249 Family history of ischemic heart disease and other diseases of the circulatory system: Secondary | ICD-10-CM | POA: Insufficient documentation

## 2017-11-27 DIAGNOSIS — G4730 Sleep apnea, unspecified: Secondary | ICD-10-CM | POA: Insufficient documentation

## 2017-11-27 DIAGNOSIS — M4726 Other spondylosis with radiculopathy, lumbar region: Secondary | ICD-10-CM | POA: Diagnosis not present

## 2017-11-27 DIAGNOSIS — G5641 Causalgia of right upper limb: Secondary | ICD-10-CM | POA: Insufficient documentation

## 2017-11-27 DIAGNOSIS — E739 Lactose intolerance, unspecified: Secondary | ICD-10-CM | POA: Insufficient documentation

## 2017-11-27 DIAGNOSIS — F1190 Opioid use, unspecified, uncomplicated: Secondary | ICD-10-CM | POA: Diagnosis not present

## 2017-11-27 DIAGNOSIS — Z8619 Personal history of other infectious and parasitic diseases: Secondary | ICD-10-CM | POA: Diagnosis not present

## 2017-11-27 DIAGNOSIS — Z886 Allergy status to analgesic agent status: Secondary | ICD-10-CM | POA: Insufficient documentation

## 2017-11-27 DIAGNOSIS — Z882 Allergy status to sulfonamides status: Secondary | ICD-10-CM | POA: Insufficient documentation

## 2017-11-27 DIAGNOSIS — M810 Age-related osteoporosis without current pathological fracture: Secondary | ICD-10-CM | POA: Diagnosis not present

## 2017-11-27 DIAGNOSIS — Z8781 Personal history of (healed) traumatic fracture: Secondary | ICD-10-CM | POA: Insufficient documentation

## 2017-11-27 DIAGNOSIS — G8929 Other chronic pain: Secondary | ICD-10-CM

## 2017-11-27 DIAGNOSIS — I4891 Unspecified atrial fibrillation: Secondary | ICD-10-CM | POA: Insufficient documentation

## 2017-11-27 DIAGNOSIS — Z8711 Personal history of peptic ulcer disease: Secondary | ICD-10-CM | POA: Insufficient documentation

## 2017-11-27 DIAGNOSIS — Z825 Family history of asthma and other chronic lower respiratory diseases: Secondary | ICD-10-CM | POA: Insufficient documentation

## 2017-11-27 DIAGNOSIS — M25561 Pain in right knee: Secondary | ICD-10-CM | POA: Insufficient documentation

## 2017-11-27 DIAGNOSIS — M549 Dorsalgia, unspecified: Secondary | ICD-10-CM | POA: Diagnosis present

## 2017-11-27 MED ORDER — FENTANYL 12 MCG/HR TD PT72: 12.5000 ug | MEDICATED_PATCH | TRANSDERMAL | 0 refills | Status: DC

## 2017-11-27 MED ORDER — OXYCODONE HCL 20 MG PO TABS: 20.0000 mg | ORAL_TABLET | Freq: Three times a day (TID) | ORAL | 0 refills | Status: DC | PRN

## 2017-11-27 MED ORDER — OXYCODONE HCL 20 MG PO TABS: 20.0000 mg | ORAL_TABLET | Freq: Three times a day (TID) | ORAL | 0 refills | Status: DC

## 2017-11-27 NOTE — Progress Notes (Signed)
Patient's Name: James [name]  MRN: [id number]  Referring Provider: Bronstein, David, MD  DOB: 06/26/1956  PCP: Bronstein, David, MD  DOS: 11/27/2017  Note by: Crystal M. King NP  Service setting: Ambulatory outpatient  Specialty: Interventional Pain Management  Location: [facility] (AMB) Pain Management Facility    Patient type: Established    Primary Reason(s) for Visit: Encounter for prescription drug management. (Level of risk: moderate)  CC: Back Pain (complete)  HPI  James [name] is a 61 y.o. year old, male patient, who comes today for a medication management evaluation. He has Airway hyperreactivity; Atrial fibrillation (HCC); Narrowing of intervertebral disc space; Abnormal echocardiogram; Gastric ulcer; Gastric catarrh; Acid reflux; Awareness of heartbeats; Hemorrhoid; Hepatitis; BP (high blood pressure); Lactose intolerance; Osteopenia; APC (atrial premature contractions); Apnea, sleep; Supraventricular tachycardia (HCC); Long term current use of opiate analgesic; Long term prescription opiate use; Opiate use (120 MME/Day); Opiate dependence (HCC); Encounter for therapeutic drug level monitoring; Chronic low back pain (Primary Source of Pain) (Bilateral) (R>L); Lumbar spondylosis; Chronic neck pain (Right); Cervical spondylosis (C7 intravertebral body cyst); Chronic cervical radicular pain (Right); Chronic lumbar radicular pain (Bilateral) (L>R) (L5 Dermatome); Osteoarthritis; Chronic hip pain; Chronic knee pain (Bilateral) (R>L); Complex regional pain syndrome type II of upper extremity (Right); Chronic upper extremity pain (Right); 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 Charles Rawlings); Epidural fibrosis; Musculoskeletal pain; Neurogenic pain; Neuropathic pain; Chronic sacroiliac joint pain  (Bilateral) (R>L); Psoriatic arthritis (HCC); Controlled type 2 diabetes mellitus without complication (HCC); Chest pain; Acquired hypothyroidism; Chronic lower extremity pain (Secondary source of pain) (Bilateral) (L>R); Psoriasis with arthropathy (HCC); Hypothyroidism; Numbness and tingling of both legs; Abnormal nerve conduction studies (06/20/16); Chronic pain syndrome; Hypotestosteronism; Nausea without vomiting; and Acute postoperative pain on their problem list. His primarily concern today is the Back Pain (complete)  Pain Assessment: Location: Upper, Lower Back Radiating: moves down both arms to finger tips and down both legs to all toes Onset: More than a month ago Duration: Neuropathic pain Quality: Constant, Aching, Shooting Severity: 3 /10 (self-reported pain score)  Note: Reported level is compatible with observation.                          Timing: Constant  James [name] was last scheduled for an appointment on 08/29/2017 for medication management. During today's appointment we reviewed James [name]'s chronic pain status, as well as his outpatient medication regimen. He admits that his neck and back pain are about the same. He denies any pain related concerns. He denies any side effects of his medications.   The patient  reports that he does not use drugs. His body mass index is 34.44 kg/m.  Further details on both, my assessment(s), as well as the proposed treatment plan, please see below.  Controlled Substance Pharmacotherapy Assessment REMS (Risk Evaluation and Mitigation Strategy)  Analgesic:Fentanyl patch 12.5 mcg/h every 72 hours + oxycodone IR 20 mg every 6 hours (60 mg/day) MME/day:120 mg/day    James [name]  11/27/2017  8:55 AM  Sign at close encounter Nursing Pain Medication Assessment:  Safety precautions to be maintained throughout the outpatient stay will include: orient to surroundings, keep bed in low position, maintain call bell within reach at all times,  provide assistance with transfer out of bed and ambulation.  Medication Inspection Compliance: Pill count conducted under   aseptic conditions, in front of the patient. Neither the pills nor the bottle was removed from the patient's sight at any time. Once count was completed pills were immediately returned to the patient in their original bottle.  Medication #1: Oxycodone HCL 20 mg Pill/Patch Count: 30 of 90 pills remain Pill/Patch Appearance: Markings consistent with prescribed medication Bottle Appearance: Standard pharmacy container. Clearly labeled. Filled Date: 01 / 03 / 2019 Last Medication intake:  Today  Medication #2: Fentanyl patch Pill/Patch Count: 3 of 5 patches remain Pill/Patch Appearance: Markings consistent with prescribed medication Bottle Appearance: Standard pharmacy container. Clearly labeled. Filled Date: 01 / 03 / 2019 Last Medication intake:  Today   Pharmacokinetics: Liberation and absorption (onset of action): WNL Distribution (time to peak effect): WNL Metabolism and excretion (duration of action): WNL         Pharmacodynamics: Desired effects: Analgesia: James [name] reports >50% benefit. Functional ability: Patient reports that medication allows him to accomplish basic ADLs Clinically meaningful improvement in function (CMIF): Sustained CMIF goals met Perceived effectiveness: Described as relatively effective, allowing for increase in activities of daily living (ADL) Undesirable effects: Side-effects or Adverse reactions: None reported Monitoring: [place] PMP: Online review of the past 35-monthperiod conducted. Compliant with practice rules and regulations Last UDS on record: Summary  Date Value Ref Range Status  08/29/2017 FINAL  Final    Comment:    ==================================================================== TOXASSURE SELECT 13 (MW) ==================================================================== Test                             Result        Flag       Units Drug Present and Declared for Prescription Verification   Oxycodone                      1366         EXPECTED   ng/mg creat   Oxymorphone                    575          EXPECTED   ng/mg creat   Noroxycodone                   >3509        EXPECTED   ng/mg creat   Noroxymorphone                 478          EXPECTED   ng/mg creat    Sources of oxycodone are scheduled prescription medications.    Oxymorphone, noroxycodone, and noroxymorphone are expected    metabolites of oxycodone. Oxymorphone is also available as a    scheduled prescription medication.   Fentanyl                       1            EXPECTED   ng/mg creat   Norfentanyl                    24           EXPECTED   ng/mg creat    Source of fentanyl is a scheduled prescription medication,    including IV, patch, and transmucosal formulations. Norfentanyl    is an expected metabolite of fentanyl. Drug Absent but Declared for Prescription Verification   Temazepam  Not Detected UNEXPECTED ng/mg creat ==================================================================== Test                      Result    Flag   Units      Ref Range   Creatinine              285              mg/dL      >=20 ==================================================================== Declared Medications:  The flagging and interpretation on this report are based on the  following declared medications.  Unexpected results may arise from  inaccuracies in the declared medications.  **Note: The testing scope of this panel includes these medications:  Fentanyl  Oxycodone  Temazepam (Restoril)  **Note: The testing scope of this panel does not include following  reported medications:  Albuterol  Azelastine  Budenoside (Symbicort)  Formoterol (Symbicort)  Loratadine  Methotrexate  Metoprolol (Lopressor)  Polyethylene Glycol  Promethazine (Phenergan)  Sucralfate (Carafate)   Tizanidine ==================================================================== For clinical consultation, please call [phone]. ====================================================================    UDS interpretation: Compliant          Medication Assessment Form: Reviewed. Patient indicates being compliant with therapy Treatment compliance: Compliant Risk Assessment Profile: Aberrant behavior: See prior evaluations. None observed or detected today Comorbid factors increasing risk of overdose: See prior notes. No additional risks detected today Risk of substance use disorder (SUD): Low Opioid Risk Tool - 11/27/17 0848      Family History of Substance Abuse   Alcohol  Negative    Illegal Drugs  Negative    Rx Drugs  Negative      Personal History of Substance Abuse   Alcohol  Negative    Illegal Drugs  Negative    Rx Drugs  Negative      Age   Age between 16-45 years   No      History of Preadolescent Sexual Abuse   History of Preadolescent Sexual Abuse  Negative or Male      Psychological Disease   Psychological Disease  Negative    Depression  Negative      Total Score   Opioid Risk Tool Scoring  0    Opioid Risk Interpretation  Low Risk      ORT Scoring interpretation table:  Score <3 = Low Risk for SUD  Score between 4-7 = Moderate Risk for SUD  Score >8 = High Risk for Opioid Abuse   Risk Mitigation Strategies:  Patient Counseling: Covered Patient-Prescriber Agreement (PPA): Present and active  Notification to other healthcare providers: Done  Pharmacologic Plan: No change in therapy, at this time.             Laboratory Chemistry  Inflammation Markers (CRP: Acute Phase) (ESR: Chronic Phase) Lab Results  Component Value Date   CRP <0.80 12/06/2016   ESRSEDRATE 9 12/06/2016                 Rheumatology Markers No results found for: RF, ANA, LABURIC, URICUR, LYMEIGGIGMAB, LYMEABIGMQN              Renal Function Markers Lab Results   Component Value Date   BUN 14 12/06/2016   CREATININE 1.26 (H) 12/06/2016   GFRAA >60 12/06/2016   GFRNONAA >60 12/06/2016                 Hepatic Function Markers Lab Results  Component Value Date   AST 35 12/06/2016   ALT 33 12/06/2016     ALBUMIN 4.4 12/06/2016   ALKPHOS 47 12/06/2016                 Electrolytes Lab Results  Component Value Date   NA 139 12/06/2016   K 4.6 12/06/2016   CL 103 12/06/2016   CALCIUM 9.6 12/06/2016   MG 2.2 12/06/2016                 Neuropathy Markers Lab Results  Component Value Date   VITAMINB12 423 12/06/2016                 Bone Pathology Markers Lab Results  Component Value Date   25OHVITD1 42 12/06/2016   25OHVITD2 <1.0 12/06/2016   25OHVITD3 42 12/06/2016                 Coagulation Parameters No results found for: INR, LABPROT, APTT, PLT, DDIMER               Cardiovascular Markers No results found for: BNP, CKTOTAL, CKMB, TROPONINI, HGB, HCT               CA Markers No results found for: CEA, CA125, LABCA2               Note: Lab results reviewed.  Recent Diagnostic Imaging Results  DG C-Arm 1-60 Min-No Report Fluoroscopy was utilized by the requesting physician.  No radiographic  interpretation.   Complexity Note: Imaging results reviewed. Results shared with James [name], using Layman's terms.                         Meds   Current Outpatient Medications:  .  albuterol (PROAIR HFA) 108 (90 BASE) MCG/ACT inhaler, Inhale 2 puffs into the lungs every 4 (four) hours as needed for wheezing or shortness of breath. , Disp: , Rfl:  .  azelastine (ASTELIN) 0.1 % nasal spray, Place 1 spray into both nostrils 2 (two) times daily. , Disp: , Rfl:  .  budesonide-formoterol (SYMBICORT) 80-4.5 MCG/ACT inhaler, Inhale 2 puffs into the lungs 2 (two) times daily., Disp: , Rfl:  .  [START ON 02/05/2018] fentaNYL (DURAGESIC - DOSED MCG/HR) 12 MCG/HR, Place 1 patch (12.5 mcg total) onto the skin every 3 (three) days., Disp: 10  patch, Rfl: 0 .  folic acid (FOLVITE) 1 MG tablet, Take by mouth., Disp: , Rfl:  .  Loratadine 10 MG CAPS, Take 10 mg by mouth daily. , Disp: , Rfl:  .  methotrexate 50 MG/2ML injection, Inject 50 mg/m2 as directed once a week. , Disp: , Rfl:  .  metoprolol (LOPRESSOR) 100 MG tablet, TAKE 1 TABLET BY MOUTH TWICE A DAY, Disp: , Rfl:  .  [START ON 02/05/2018] Oxycodone HCl 20 MG TABS, Take 1 tablet (20 mg total) by mouth every 8 (eight) hours as needed., Disp: 90 tablet, Rfl: 0 .  pantoprazole (PROTONIX) 20 MG tablet, Take 20 mg by mouth., Disp: , Rfl:  .  promethazine (PHENERGAN) 12.5 MG tablet, Take by mouth every 4 (four) hours as needed. , Disp: , Rfl:  .  sucralfate (CARAFATE) 1 G tablet, Take 1 g by mouth 4 (four) times daily. , Disp: , Rfl:  .  tiZANidine (ZANAFLEX) 4 MG capsule, Take 4 mg by mouth 3 (three) times daily as needed for muscle spasms., Disp: , Rfl:  .  [START ON 01/06/2018] fentaNYL (DURAGESIC - DOSED MCG/HR) 12 MCG/HR, Place 1 patch (12.5 mcg total) onto the skin every   3 (three) days., Disp: 10 patch, Rfl: 0 .  [START ON 12/07/2017] fentaNYL (DURAGESIC - DOSED MCG/HR) 12 MCG/HR, Place 1 patch (12.5 mcg total) onto the skin every 3 (three) days., Disp: 10 patch, Rfl: 0 .  [START ON 01/06/2018] Oxycodone HCl 20 MG TABS, Take 1 tablet (20 mg total) by mouth every 8 (eight) hours as needed., Disp: 90 tablet, Rfl: 0 .  [START ON 12/07/2017] Oxycodone HCl 20 MG TABS, Take 1 tablet (20 mg total) by mouth every 8 (eight) hours., Disp: 90 tablet, Rfl: 0  ROS  Constitutional: Denies any fever or chills Gastrointestinal: No reported hemesis, hematochezia, vomiting, or acute GI distress Musculoskeletal: Denies any acute onset joint swelling, redness, loss of ROM, or weakness Neurological: No reported episodes of acute onset apraxia, aphasia, dysarthria, agnosia, amnesia, paralysis, loss of coordination, or loss of consciousness  Allergies  James [name] is allergic to penicillin g; penicillins;  lactose; nsaids; prednisone; procaine; sulfasalazine; talwin [pentazocine]; codeine; and sulfa antibiotics.  [place]  Drug: James [name]  reports that he does not use drugs. Alcohol:  reports that he does not drink alcohol. Tobacco:  reports that he has quit smoking. His smoking use included cigarettes. His smokeless tobacco use includes chew. Medical:  has a past medical history of Allergy, Anemia, Arthritis, Asthma, Blood transfusion without reported diagnosis, Bronchitis, Bursitis, Cancer ([place]), Cataract, 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09/13/2015), Gastritis, GERD (gastroesophageal reflux disease), Heart murmur, Hepatitis C, Hiatal hernia, Hypertension, Hypothyroidism, Lupus, Obesity, Osteoporosis, Peripheral nerve disease, Psoriatic arthritis ([place]), Sleep apnea, Supraventricular tachycardia ([place]), Tendonitis, and Thyroid disease. Surgical: James [name]  has a past surgical history that includes Spine surgery; Eye surgery; Fracture surgery (Right); Fracture surgery (Bilateral); Fracture surgery (Bilateral); Joint replacement (Right); Spinal cord stimulator implant; Spinal cord stimulator removal; Patella arthroplasty; Shoulder arthroscopy (Bilateral); Esophagogastroduodenoscopy (egd) with propofol (N/A, 01/20/2016); and Colonoscopy (2009). Family: family history includes Arthritis in his father and mother; COPD in his father; Cancer in his father and mother; Diabetes in his mother; Hypertension in his father and mother; Stroke in his mother.  Constitutional Exam  General appearance: Well nourished, well developed, and well hydrated. In no apparent acute distress Vitals:   11/27/17 0833  BP: 114/66  Pulse: 65  Resp: 16  Temp: 97.8 F (36.6 C)  TempSrc: Oral  SpO2: 99%  Weight: 240 lb (108.9 kg)  Height: 5' 10" (1.778 m)  Psych/Mental status: Alert, oriented x 3 (person, place, & time)       Respiratory: No evidence of acute respiratory distress  Cervical  Spine Area Exam  Skin & Axial Inspection: No masses, redness, edema, swelling, or associated skin lesions Alignment: Symmetrical Functional ROM: Unrestricted ROM      Stability: No instability detected Muscle Tone/Strength: Functionally intact. No obvious neuro-muscular anomalies detected. Sensory (Neurological): Unimpaired Palpation: No palpable anomalies              Upper Extremity (UE) Exam    Side: Right upper extremity  Side: Left upper extremity  Skin & Extremity Inspection: Skin color, temperature, and hair growth are WNL. No peripheral edema or cyanosis. No masses, redness, swelling, asymmetry, or associated skin lesions. No contractures.  Skin & Extremity Inspection: Skin color, temperature, and hair growth are WNL. No peripheral edema or cyanosis. No masses, redness, swelling, asymmetry, or associated skin lesions. No contractures.  Functional ROM: Unrestricted ROM          Functional ROM: Unrestricted ROM  Muscle Tone/Strength: Functionally intact. No obvious neuro-muscular anomalies detected.  Muscle Tone/Strength: Functionally intact. No obvious neuro-muscular anomalies detected.  Sensory (Neurological): Unimpaired          Sensory (Neurological): Unimpaired          Palpation: No palpable anomalies              Palpation: No palpable anomalies              Specialized Test(s): Deferred         Specialized Test(s): Deferred          Thoracic Spine Area Exam  Skin & Axial Inspection: No masses, redness, or swelling Alignment: Symmetrical Functional ROM: Unrestricted ROM Stability: No instability detected Muscle Tone/Strength: Functionally intact. No obvious neuro-muscular anomalies detected. Sensory (Neurological): Unimpaired Muscle strength & Tone: No palpable anomalies  Lumbar Spine Area Exam  Skin & Axial Inspection: No masses, redness, or swelling Alignment: Symmetrical Functional ROM: Unrestricted ROM      Stability: No instability detected Muscle  Tone/Strength: Functionally intact. No obvious neuro-muscular anomalies detected. Sensory (Neurological): Unimpaired Palpation: No palpable anomalies       Provocative Tests: Lumbar Hyperextension and rotation test: evaluation deferred today       Lumbar Lateral bending test: evaluation deferred today       Patrick's Maneuver: evaluation deferred today                    Gait & Posture Assessment  Ambulation: Unassisted Gait: Relatively normal for age and body habitus Posture: WNL   Lower Extremity Exam    Side: Right lower extremity  Side: Left lower extremity  Skin & Extremity Inspection: Skin color, temperature, and hair growth are WNL. No peripheral edema or cyanosis. No masses, redness, swelling, asymmetry, or associated skin lesions. No contractures.  Skin & Extremity Inspection: Skin color, temperature, and hair growth are WNL. No peripheral edema or cyanosis. No masses, redness, swelling, asymmetry, or associated skin lesions. No contractures.  Functional ROM: Unrestricted ROM          Functional ROM: Unrestricted ROM          Muscle Tone/Strength: Functionally intact. No obvious neuro-muscular anomalies detected.  Muscle Tone/Strength: Functionally intact. No obvious neuro-muscular anomalies detected.  Sensory (Neurological): Unimpaired  Sensory (Neurological): Unimpaired  Palpation: No palpable anomalies  Palpation: No palpable anomalies   Assessment  Primary Diagnosis & Pertinent Problem List: The primary encounter diagnosis was Complex regional pain syndrome type II of upper extremity (Right). Diagnoses of Chronic lumbar radicular pain (Bilateral) (L>R) (L5 Dermatome), Chronic cervical radicular pain (Right), Chronic knee pain (Left), Chronic pain syndrome, and Opiate use (120 MME/Day) were also pertinent to this visit.  Status Diagnosis  Controlled Controlled Controlled 1. Complex regional pain syndrome type II of upper extremity (Right)   2. Chronic lumbar radicular pain  (Bilateral) (L>R) (L5 Dermatome)   3. Chronic cervical radicular pain (Right)   4. Chronic knee pain (Left)   5. Chronic pain syndrome   6. Opiate use (120 MME/Day)     Problems updated and reviewed during this visit: No problems updated. Plan of Care  Pharmacotherapy (Medications Ordered): Meds ordered this encounter  Medications  . fentaNYL (DURAGESIC - DOSED MCG/HR) 12 MCG/HR    Sig: Place 1 patch (12.5 mcg total) onto the skin every 3 (three) days.    Dispense:  10 patch    Refill:  0    Do not place this medication, or any other prescription   from our practice, on "Automatic Refill". Patient may have prescription filled one day early if pharmacy is closed on scheduled refill date. Do not fill until: 02/05/2018 To last until: 03/07/2018    Order Specific Question:   Supervising Provider    Answer:   NAVEIRA, FRANCISCO [[id number]]  . fentaNYL (DURAGESIC - DOSED MCG/HR) 12 MCG/HR    Sig: Place 1 patch (12.5 mcg total) onto the skin every 3 (three) days.    Dispense:  10 patch    Refill:  0    Do not place this medication, or any other prescription from our practice, on "Automatic Refill". Patient may have prescription filled one day early if pharmacy is closed on scheduled refill date. Do not fill until: 01/06/2018 To last until: 02/05/2018    Order Specific Question:   Supervising Provider    Answer:   NAVEIRA, FRANCISCO [[id number]]  . fentaNYL (DURAGESIC - DOSED MCG/HR) 12 MCG/HR    Sig: Place 1 patch (12.5 mcg total) onto the skin every 3 (three) days.    Dispense:  10 patch    Refill:  0    Do not place this medication, or any other prescription from our practice, on "Automatic Refill". Patient may have prescription filled one day early if pharmacy is closed on scheduled refill date. Do not fill until: 12/07/2017 To last until: 01/06/2018    Order Specific Question:   Supervising Provider    Answer:   NAVEIRA, FRANCISCO [[id number]]  . Oxycodone HCl 20 MG TABS    Sig: Take 1 tablet (20 mg  total) by mouth every 8 (eight) hours as needed.    Dispense:  90 tablet    Refill:  0    Do not place this medication, or any other prescription from our practice, on "Automatic Refill". Patient may have prescription filled one day early if pharmacy is closed on scheduled refill date. Do not fill until: 02/05/2018 To last until: 03/07/2018    Order Specific Question:   Supervising Provider    Answer:   NAVEIRA, FRANCISCO [[id number]]  . Oxycodone HCl 20 MG TABS    Sig: Take 1 tablet (20 mg total) by mouth every 8 (eight) hours as needed.    Dispense:  90 tablet    Refill:  0    Do not place this medication, or any other prescription from our practice, on "Automatic Refill". Patient may have prescription filled one day early if pharmacy is closed on scheduled refill date. Do not fill until: 01/06/2018 To last until:02/05/2018    Order Specific Question:   Supervising Provider    Answer:   NAVEIRA, FRANCISCO [[id number]]  . Oxycodone HCl 20 MG TABS    Sig: Take 1 tablet (20 mg total) by mouth every 8 (eight) hours.    Dispense:  90 tablet    Refill:  0    Do not place this medication, or any other prescription from our practice, on "Automatic Refill". Patient may have prescription filled one day early if pharmacy is closed on scheduled refill date. Do not fill until:12/07/2017 To last until: 01/06/2018    Order Specific Question:   Supervising Provider    Answer:   NAVEIRA, FRANCISCO [[id number]]   New Prescriptions   No medications on file   Medications administered today: James [name] had no medications administered during this visit. Lab-work, procedure(s), and/or referral(s): Orders Placed This Encounter  Procedures  . ToxASSURE Select 13 (MW), Urine   Imaging and/or referral(s): None  Interventional therapies:   Planned, scheduled, and/or pending:   Not at this time.     Considering:  Diagnostic Right-sided cervical epiduralsteroid injection.  Diagnostic Right-sided L4-5 lumbar  epiduralsteroid injection.  Diagnostic bilateral lumbar facetblock. Possible bilateral lumbar facet radiofrequencyablation. Diagnostic bilateral cervical facetblock.  Possible bilateral cervical facet radiofrequencyablation.   Palliative PRN treatment(s):  For the arm pain and numbness, right-sided cervical epidural steroid injection For the lower extremity pain, right-sided L4-5 lumbar epidural steroid injection For the low back pain, diagnostic bilateral lumbar facet block For the neck pain, diagnostic bilateral cervical facet block   Provider-requested follow-up: Return in about 3 months (around 02/25/2018) for MedMgmt with Me (Crystal King).  Future Appointments  Date Time Provider Department Center  02/19/2018  8:30 AM King, Crystal M, NP [facility] None   Primary Care Physician: Bronstein, David, MD Location: [facility] Note by: Crystal M. King NP Date: 11/27/2017; Time: 1:29 PM  Pain Score Disclaimer: We use the NRS-11 scale. This is a self-reported, subjective measurement of pain severity with only modest accuracy. It is used primarily to identify changes within a particular patient. It must be understood that outpatient pain scales are significantly less accurate that those used for research, where they can be applied under ideal controlled circumstances with minimal exposure to variables. In reality, the score is likely to be a combination of pain intensity and pain affect, where pain affect describes the degree of emotional arousal or changes in action readiness caused by the sensory experience of pain. Factors such as social and work situation, setting, emotional state, anxiety levels, expectation, and prior pain experience may influence pain perception and show large inter-individual differences that may also be affected by time variables.  Patient instructions provided during this appointment: Patient Instructions      ____________________________________________________________________________________________  Medication Rules  Applies to: All patients receiving prescriptions (written or electronic).  Pharmacy of record: Pharmacy where electronic prescriptions will be sent. If written prescriptions are taken to a different pharmacy, please inform the nursing staff. The pharmacy listed in the electronic medical record should be the one where you would like electronic prescriptions to be sent.  Prescription refills: Only during scheduled appointments. Applies to both, written and electronic prescriptions.  NOTE: The following applies primarily to controlled substances (Opioid* Pain Medications).   Patient's responsibilities: 1. Pain Pills: Bring all pain pills to every appointment (except for procedure appointments). 2. Pill Bottles: Bring pills in original pharmacy bottle. Always bring newest bottle. Bring bottle, even if empty. 3. Medication refills: You are responsible for knowing and keeping track of what medications you need refilled. The day before your appointment, write a list of all prescriptions that need to be refilled. Bring that list to your appointment and give it to the admitting nurse. Prescriptions will be written only during appointments. If you forget a medication, it will not be "Called in", "Faxed", or "electronically sent". You will need to get another appointment to get these prescribed. 4. Prescription Accuracy: You are responsible for carefully inspecting your prescriptions before leaving our office. Have the discharge nurse carefully go over each prescription with you, before taking them home. Make sure that your name is accurately spelled, that your address is correct. Check the name and dose of your medication to make sure it is accurate. Check the number of pills, and the written instructions to make sure they are clear and accurate. Make sure that you are given enough medication  to last until your next medication refill appointment. 5.   Taking Medication: Take medication as prescribed. Never take more pills than instructed. Never take medication more frequently than prescribed. Taking less pills or less frequently is permitted and encouraged, when it comes to controlled substances (written prescriptions).  6. Inform other Doctors: Always inform, all of your healthcare providers, of all the medications you take. 7. Pain Medication from other Providers: You are not allowed to accept any additional pain medication from any other Doctor or Healthcare provider. There are two exceptions to this rule. (see below) In the event that you require additional pain medication, you are responsible for notifying us, as stated below. 8. Medication Agreement: You are responsible for carefully reading and following our Medication Agreement. This must be signed before receiving any prescriptions from our practice. Safely store a copy of your signed Agreement. Violations to the Agreement will result in no further prescriptions. (Additional copies of our Medication Agreement are available upon request.) 9. Laws, Rules, & Regulations: All patients are expected to follow all Federal and State Laws, Statutes, Rules, & Regulations. Ignorance of the Laws does not constitute a valid excuse. The use of any illegal substances is prohibited. 10. Adopted CDC guidelines & recommendations: Target dosing levels will be at or below 60 MME/day. Use of benzodiazepines** is not recommended.  Exceptions: There are only two exceptions to the rule of not receiving pain medications from other Healthcare Providers. 1. Exception #1 (Emergencies): In the event of an emergency (i.e.: accident requiring emergency care), you are allowed to receive additional pain medication. However, you are responsible for: As soon as you are able, call our office [phone], at any time of the day or night, and leave a message stating your  name, the date and nature of the emergency, and the name and dose of the medication prescribed. In the event that your call is answered by a member of our staff, make sure to document and save the date, time, and the name of the person that took your information.  2. Exception #2 (Planned Surgery): In the event that you are scheduled by another doctor or dentist to have any type of surgery or procedure, you are allowed (for a period no longer than 30 days), to receive additional pain medication, for the acute post-op pain. However, in this case, you are responsible for picking up a copy of our "Post-op Pain Management for Surgeons" handout, and giving it to your surgeon or dentist. This document is available at our office, and does not require an appointment to obtain it. Simply go to our office during business hours (Monday-Thursday from 8:00 AM to 4:00 PM) (Friday 8:00 AM to 12:00 Noon) or if you have a scheduled appointment with us, prior to your surgery, and ask for it by name. In addition, you will need to provide us with your name, name of your surgeon, type of surgery, and date of procedure or surgery.  *Opioid medications include: morphine, codeine, oxycodone, oxymorphone, hydrocodone, hydromorphone, meperidine, tramadol, tapentadol, buprenorphine, fentanyl, methadone. **Benzodiazepine medications include: diazepam (Valium), alprazolam (Xanax), clonazepam (Klonopine), lorazepam (Ativan), clorazepate (Tranxene), chlordiazepoxide (Librium), estazolam (Prosom), oxazepam (Serax), temazepam (Restoril), triazolam (Halcion)  ____________________________________________________________________________________________   BMI Assessment: Estimated body mass index is 34.44 kg/m as calculated from the following:   Height as of this encounter: 5' 10" (1.778 m).   Weight as of this encounter: 240 lb (108.9 kg).  BMI interpretation table: BMI level Category Range association with higher incidence of chronic  pain  <18 kg/m2 Underweight   18.5-24.9 kg/m2 Ideal   body weight   25-29.9 kg/m2 Overweight Increased incidence by 20%  30-34.9 kg/m2 Obese (Class I) Increased incidence by 68%  35-39.9 kg/m2 Severe obesity (Class II) Increased incidence by 136%  >40 kg/m2 Extreme obesity (Class III) Increased incidence by 254%   BMI Readings from Last 4 Encounters:  11/27/17 34.44 kg/m  08/29/17 35.26 kg/m  07/18/17 34.58 kg/m  05/30/17 33.91 kg/m   Wt Readings from Last 4 Encounters:  11/27/17 240 lb (108.9 kg)  08/29/17 260 lb (117.9 kg)  07/18/17 255 lb (115.7 kg)  05/30/17 250 lb (113.4 kg)     

## 2017-11-27 NOTE — Progress Notes (Signed)
Nursing Pain Medication Assessment:  Safety precautions to be maintained throughout the outpatient stay will include: orient to surroundings, keep bed in low position, maintain call bell within reach at all times, provide assistance with transfer out of bed and ambulation.  Medication Inspection Compliance: Pill count conducted under aseptic conditions, in front of the patient. Neither the pills nor the bottle was removed from the patient's sight at any time. Once count was completed pills were immediately returned to the patient in their original bottle.  Medication #1: Oxycodone HCL 20 mg Pill/Patch Count: 30 of 90 pills remain Pill/Patch Appearance: Markings consistent with prescribed medication Bottle Appearance: Standard pharmacy container. Clearly labeled. Filled Date: 01 / 03 / 2019 Last Medication intake:  Today  Medication #2: Fentanyl patch Pill/Patch Count: 3 of 5 patches remain Pill/Patch Appearance: Markings consistent with prescribed medication Bottle Appearance: Standard pharmacy container. Clearly labeled. Filled Date: 01 / 03 / 2019 Last Medication intake:  Today

## 2017-11-27 NOTE — Patient Instructions (Addendum)
____________________________________________________________________________________________  Medication Rules  Applies to: All patients receiving prescriptions (written or electronic).  Pharmacy of record: Pharmacy where electronic prescriptions will be sent. If written prescriptions are taken to a different pharmacy, please inform the nursing staff. The pharmacy listed in the electronic medical record should be the one where you would like electronic prescriptions to be sent.  Prescription refills: Only during scheduled appointments. Applies to both, written and electronic prescriptions.  NOTE: The following applies primarily to controlled substances (Opioid* Pain Medications).   Patient's responsibilities: 1. Pain Pills: Bring all pain pills to every appointment (except for procedure appointments). 2. Pill Bottles: Bring pills in original pharmacy bottle. Always bring newest bottle. Bring bottle, even if empty. 3. Medication refills: You are responsible for knowing and keeping track of what medications you need refilled. The day before your appointment, write a list of all prescriptions that need to be refilled. Bring that list to your appointment and give it to the admitting nurse. Prescriptions will be written only during appointments. If you forget a medication, it will not be "Called in", "Faxed", or "electronically sent". You will need to get another appointment to get these prescribed. 4. Prescription Accuracy: You are responsible for carefully inspecting your prescriptions before leaving our office. Have the discharge nurse carefully go over each prescription with you, before taking them home. Make sure that your name is accurately spelled, that your address is correct. Check the name and dose of your medication to make sure it is accurate. Check the number of pills, and the written instructions to make sure they are clear and accurate. Make sure that you are given enough medication to  last until your next medication refill appointment. 5. Taking Medication: Take medication as prescribed. Never take more pills than instructed. Never take medication more frequently than prescribed. Taking less pills or less frequently is permitted and encouraged, when it comes to controlled substances (written prescriptions).  6. Inform other Doctors: Always inform, all of your healthcare providers, of all the medications you take. 7. Pain Medication from other Providers: You are not allowed to accept any additional pain medication from any other Doctor or Healthcare provider. There are two exceptions to this rule. (see below) In the event that you require additional pain medication, you are responsible for notifying us, as stated below. 8. Medication Agreement: You are responsible for carefully reading and following our Medication Agreement. This must be signed before receiving any prescriptions from our practice. Safely store a copy of your signed Agreement. Violations to the Agreement will result in no further prescriptions. (Additional copies of our Medication Agreement are available upon request.) 9. Laws, Rules, & Regulations: All patients are expected to follow all Federal and [employer], Rules, [employer]. Ignorance of the Laws does not constitute a valid excuse. The use of any illegal substances is prohibited. 10. Adopted CDC guidelines & recommendations: Target dosing levels will be at or below 60 MME/day. Use of benzodiazepines** is not recommended.  Exceptions: There are only two exceptions to the rule of not receiving pain medications from other Healthcare Providers. 1. Exception #1 (Emergencies): In the event of an emergency (i.e.: accident requiring emergency care), you are allowed to receive additional pain medication. However, you are responsible for: As soon as you are able, call our office [phone], at any time of the day or night, and leave a message stating your  name, the date and nature of the emergency, and the name and dose of the  medication prescribed. In the event that your call is answered by a member of our staff, make sure to document and save the date, time, and the name of the person that took your information.  2. Exception #2 (Planned Surgery): In the event that you are scheduled by another doctor or dentist to have any type of surgery or procedure, you are allowed (for a period no longer than 30 days), to receive additional pain medication, for the acute post-op pain. However, in this case, you are responsible for picking up a copy of our "Post-op Pain Management for Surgeons" handout, and giving it to your surgeon or dentist. This document is available at our office, and does not require an appointment to obtain it. Simply go to our office during business hours (Monday-Thursday from 8:00 AM to 4:00 PM) (Friday 8:00 AM to 12:00 Noon) or if you have a scheduled appointment with [place], prior to your surgery, and ask for it by name. In addition, you will need to provide [place] with your name, name of your surgeon, type of surgery, and date of procedure or surgery.  *Opioid medications include: morphine, codeine, oxycodone, oxymorphone, hydrocodone, hydromorphone, meperidine, tramadol, tapentadol, buprenorphine, fentanyl, methadone. **Benzodiazepine medications include: diazepam (Valium), alprazolam (Xanax), clonazepam (Klonopine), lorazepam (Ativan), clorazepate (Tranxene), chlordiazepoxide (Librium), estazolam (Prosom), oxazepam (Serax), temazepam (Restoril), triazolam (Halcion)  ____________________________________________________________________________________________   BMI Assessment: Estimated body mass index is 34.44 kg/m as calculated from the following:   Height as of this encounter: 5\' 10"  (1.778 m).   Weight as of this encounter: 240 lb (108.9 kg).  BMI interpretation table: BMI level Category Range association with higher incidence of chronic  pain  <18 kg/m2 Underweight   18.5-24.9 kg/m2 Ideal body weight   25-29.9 kg/m2 Overweight Increased incidence by 20%  30-34.9 kg/m2 Obese (Class I) Increased incidence by 68%  35-39.9 kg/m2 Severe obesity (Class II) Increased incidence by 136%  >40 kg/m2 Extreme obesity (Class III) Increased incidence by 254%   BMI Readings from Last 4 Encounters:  11/27/17 34.44 kg/m  08/29/17 35.26 kg/m  07/18/17 34.58 kg/m  05/30/17 33.91 kg/m   Wt Readings from Last 4 Encounters:  11/27/17 240 lb (108.9 kg)  08/29/17 260 lb (117.9 kg)  07/18/17 255 lb (115.7 kg)  05/30/17 250 lb (113.4 kg)

## 2017-12-02 LAB — TOXASSURE SELECT 13 (MW), URINE

## 2017-12-06 ENCOUNTER — Ambulatory Visit: Admission: RE | Admit: 2017-12-06 | Payer: PPO | Source: Ambulatory Visit | Admitting: Gastroenterology

## 2017-12-06 ENCOUNTER — Encounter: Admission: RE | Payer: Self-pay | Source: Ambulatory Visit

## 2017-12-06 SURGERY — COLONOSCOPY WITH PROPOFOL
Anesthesia: General

## 2017-12-12 DIAGNOSIS — I10 Essential (primary) hypertension: Secondary | ICD-10-CM | POA: Diagnosis not present

## 2017-12-12 DIAGNOSIS — M1990 Unspecified osteoarthritis, unspecified site: Secondary | ICD-10-CM | POA: Diagnosis not present

## 2017-12-12 DIAGNOSIS — E119 Type 2 diabetes mellitus without complications: Secondary | ICD-10-CM | POA: Diagnosis not present

## 2017-12-12 DIAGNOSIS — R7989 Other specified abnormal findings of blood chemistry: Secondary | ICD-10-CM | POA: Diagnosis not present

## 2017-12-12 DIAGNOSIS — Z79899 Other long term (current) drug therapy: Secondary | ICD-10-CM | POA: Diagnosis not present

## 2017-12-12 DIAGNOSIS — E349 Endocrine disorder, unspecified: Secondary | ICD-10-CM | POA: Diagnosis not present

## 2017-12-12 DIAGNOSIS — L409 Psoriasis, unspecified: Secondary | ICD-10-CM | POA: Diagnosis not present

## 2017-12-12 DIAGNOSIS — Z13220 Encounter for screening for lipoid disorders: Secondary | ICD-10-CM | POA: Diagnosis not present

## 2017-12-12 DIAGNOSIS — Z125 Encounter for screening for malignant neoplasm of prostate: Secondary | ICD-10-CM | POA: Diagnosis not present

## 2017-12-12 LAB — HEMOGLOBIN A1C: Hemoglobin A1C: 5.2

## 2017-12-23 DIAGNOSIS — X32XXXA Exposure to sunlight, initial encounter: Secondary | ICD-10-CM | POA: Diagnosis not present

## 2017-12-23 DIAGNOSIS — D225 Melanocytic nevi of trunk: Secondary | ICD-10-CM | POA: Diagnosis not present

## 2017-12-23 DIAGNOSIS — E349 Endocrine disorder, unspecified: Secondary | ICD-10-CM | POA: Diagnosis not present

## 2017-12-23 DIAGNOSIS — Z85828 Personal history of other malignant neoplasm of skin: Secondary | ICD-10-CM | POA: Diagnosis not present

## 2017-12-23 DIAGNOSIS — L298 Other pruritus: Secondary | ICD-10-CM | POA: Diagnosis not present

## 2017-12-23 DIAGNOSIS — L820 Inflamed seborrheic keratosis: Secondary | ICD-10-CM | POA: Diagnosis not present

## 2017-12-23 DIAGNOSIS — L538 Other specified erythematous conditions: Secondary | ICD-10-CM | POA: Diagnosis not present

## 2017-12-23 DIAGNOSIS — D692 Other nonthrombocytopenic purpura: Secondary | ICD-10-CM | POA: Diagnosis not present

## 2017-12-23 DIAGNOSIS — R109 Unspecified abdominal pain: Secondary | ICD-10-CM | POA: Diagnosis not present

## 2017-12-23 DIAGNOSIS — R1084 Generalized abdominal pain: Secondary | ICD-10-CM | POA: Diagnosis not present

## 2017-12-23 DIAGNOSIS — L570 Actinic keratosis: Secondary | ICD-10-CM | POA: Diagnosis not present

## 2017-12-23 DIAGNOSIS — L940 Localized scleroderma [morphea]: Secondary | ICD-10-CM | POA: Diagnosis not present

## 2018-01-06 DIAGNOSIS — K219 Gastro-esophageal reflux disease without esophagitis: Secondary | ICD-10-CM | POA: Diagnosis not present

## 2018-01-06 DIAGNOSIS — R1310 Dysphagia, unspecified: Secondary | ICD-10-CM | POA: Diagnosis not present

## 2018-01-06 DIAGNOSIS — Z1211 Encounter for screening for malignant neoplasm of colon: Secondary | ICD-10-CM | POA: Diagnosis not present

## 2018-01-13 ENCOUNTER — Ambulatory Visit: Payer: PPO | Admitting: Urology

## 2018-01-13 ENCOUNTER — Encounter: Payer: Self-pay | Admitting: Urology

## 2018-01-13 VITALS — BP 128/72 | HR 74 | Ht 71.0 in | Wt 270.0 lb

## 2018-01-13 DIAGNOSIS — N281 Cyst of kidney, acquired: Secondary | ICD-10-CM | POA: Diagnosis not present

## 2018-01-13 DIAGNOSIS — R3915 Urgency of urination: Secondary | ICD-10-CM | POA: Diagnosis not present

## 2018-01-13 DIAGNOSIS — R102 Pelvic and perineal pain: Secondary | ICD-10-CM

## 2018-01-13 LAB — URINALYSIS, COMPLETE
Bilirubin, UA: NEGATIVE
GLUCOSE, UA: NEGATIVE
Ketones, UA: NEGATIVE
LEUKOCYTES UA: NEGATIVE
Nitrite, UA: NEGATIVE
PH UA: 5.5 (ref 5.0–7.5)
PROTEIN UA: NEGATIVE
RBC, UA: NEGATIVE
Specific Gravity, UA: >1.03 — ABNORMAL HIGH (ref 1.005–1.030)
UUROB: 1 mg/dL (ref 0.2–1.0)

## 2018-01-13 LAB — MICROSCOPIC EXAMINATION
EPITHELIAL CELLS (NON RENAL): NONE SEEN /HPF (ref 0–10)
RBC MICROSCOPIC, UA: NONE SEEN /HPF (ref 0–?)

## 2018-01-13 MED ORDER — ALFUZOSIN HCL ER 10 MG PO TB24: 10.0000 mg | ORAL_TABLET | Freq: Every day | ORAL | 1 refills | Status: DC

## 2018-01-13 NOTE — Progress Notes (Signed)
01/13/2018 10:13 AM   James [name] 1956/08/01 [id number]  Referring provider: [name], MD [phone] [place],  [address]  Chief Complaint  Patient presents with  . New Patient (Initial Visit)    HPI: James [name] is a 62-year male seen in consultation at the request of Dr. Lovie Macadamia for evaluation of pelvic pain.  He presents with a 60-month history of suprapubic pain which he describes as an aching/burning sensation.  The pain is worse with bladder filling and associated with urinary urgency.  His symptoms have actually improved in the past several weeks.  He has not had any previous treatment.  Urinalysis and urine culture was negative.  A PSA in February 2019 was 0.36.  He has previously been on testosterone replacement therapy however stopped approximately 4 months ago due to development of perineal pain.  He denies dysuria or gross hematuria.  He does note intermittent urinary stream and urinary hesitancy.   A CT scan of the abdomen pelvis with contrast performed in February 2018 showed a small left renal cyst and a 16 mm indeterminate left renal mass for which an MRI was recommended.  A subsequent MRI was classified as a Bosniak 60F lesion and a six-month follow-up MRI is recommended.   PMH: Past Medical History:  Diagnosis Date  . Allergy   . Anemia   . Arthritis   . Asthma   . Blood transfusion without reported diagnosis   . Bronchitis   . Bursitis   . Cancer ([place])    skin  . Cataract    bilateral  . 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 Gastritis   . GERD (gastroesophageal reflux disease)   . Heart murmur   . Hepatitis C   . Hiatal hernia   . Hypertension   . Hypothyroidism   . Kidney stone   . Lupus   . Obesity   . Osteoporosis   . Peripheral nerve disease   . Psoriatic arthritis ([place])   . Sleep apnea   . Supraventricular tachycardia ([place])   . Tendonitis   . Thyroid disease     Surgical  History: Past Surgical History:  Procedure Laterality Date  . COLONOSCOPY  2009   in [place]  . ESOPHAGOGASTRODUODENOSCOPY (EGD) WITH PROPOFOL N/A 01/20/2016   Procedure: ESOPHAGOGASTRODUODENOSCOPY (EGD) WITH PROPOFOL;  Surgeon: Lollie Sails, MD;  Location: [facility] ENDOSCOPY;  Service: Endoscopy;  Laterality: N/A;  . EYE SURGERY     removal of foreign object  . FRACTURE SURGERY Right    arm  . FRACTURE SURGERY Bilateral    fingers  . FRACTURE SURGERY Bilateral    wrist  . JOINT REPLACEMENT Right    radial head  . PATELLA ARTHROPLASTY    . SHOULDER ARTHROSCOPY Bilateral   . SPINAL CORD STIMULATOR IMPLANT    . SPINAL CORD STIMULATOR REMOVAL    . SPINE SURGERY     cervical and thoracic and lumbar    Home Medications:  Allergies as of 01/13/2018      Reactions   Penicillin G Rash   Penicillins Rash   tachycardia   Lactose Other (See Comments)   Other reaction(s): Unknown Stomach bleed   Nsaids Nausea Only, Other (See Comments)   GI BLEED GI BLEED   Prednisone    Procaine    Other reaction(s): Other (See Comments) Tachycardia   Sulfasalazine    Other reaction(s): Other (See Comments) Severe joint pain   Talwin [pentazocine] Other (See  Comments), Nausea Only   tachycardia   Codeine Rash   tachycardia   Sulfa Antibiotics Nausea Only, Rash   Other reaction(s): Unknown      Medication List        Accurate as of 01/13/18 10:13 AM. Always use your most recent med list.          azelastine 0.1 % nasal spray Commonly known as:  ASTELIN Place 1 spray into both nostrils 2 (two) times daily.   budesonide-formoterol 80-4.5 MCG/ACT inhaler Commonly known as:  SYMBICORT Inhale 2 puffs into the lungs 2 (two) times daily.   cholecalciferol 1000 units tablet Commonly known as:  VITAMIN D Take 1,000 Units by mouth daily.   fentaNYL 12 MCG/HR Commonly known as:  DURAGESIC - dosed mcg/hr Place 1 patch (12.5 mcg total) onto the skin every 3 (three) days.   fentaNYL 12  MCG/HR Commonly known as:  DURAGESIC - dosed mcg/hr Place 1 patch (12.5 mcg total) onto the skin every 3 (three) days.   folic acid 1 MG tablet Commonly known as:  FOLVITE Take by mouth.   Loratadine 10 MG Caps Take 10 mg by mouth daily.   methotrexate 50 MG/2ML injection Inject 50 mg/m2 as directed once a week.   metoprolol tartrate 100 MG tablet Commonly known as:  LOPRESSOR TAKE 1 TABLET BY MOUTH TWICE A DAY   Oxycodone HCl 20 MG Tabs Take 1 tablet (20 mg total) by mouth every 8 (eight) hours.   Oxycodone HCl 20 MG Tabs Take 1 tablet (20 mg total) by mouth every 8 (eight) hours as needed. Start taking on:  02/05/2018   pantoprazole 20 MG tablet Commonly known as:  PROTONIX Take 20 mg by mouth.   PROAIR HFA 108 (90 Base) MCG/ACT inhaler Generic drug:  albuterol Inhale 2 puffs into the lungs every 4 (four) hours as needed for wheezing or shortness of breath.   promethazine 12.5 MG tablet Commonly known as:  PHENERGAN Take by mouth every 4 (four) hours as needed.   sucralfate 1 g tablet Commonly known as:  CARAFATE Take 1 g by mouth 4 (four) times daily.   testosterone cypionate 200 MG/ML injection Commonly known as:  DEPOTESTOSTERONE CYPIONATE Inject into the muscle every 14 (fourteen) days.   tiZANidine 4 MG capsule Commonly known as:  ZANAFLEX Take 4 mg by mouth 3 (three) times daily as needed for muscle spasms.   VITAMIN B COMPLEX 100 IJ Inject as directed.       Allergies:  Allergies  Allergen Reactions  . Penicillin G Rash  . Penicillins Rash    tachycardia  . Lactose Other (See Comments)    Other reaction(s): Unknown Stomach bleed  . Nsaids Nausea Only and Other (See Comments)    GI BLEED GI BLEED  . Prednisone   . Procaine     Other reaction(s): Other (See Comments) Tachycardia  . Sulfasalazine     Other reaction(s): Other (See Comments) Severe joint pain  . Talwin [Pentazocine] Other (See Comments) and Nausea Only    tachycardia  .  Codeine Rash    tachycardia  . Sulfa Antibiotics Nausea Only and Rash    Other reaction(s): Unknown    Family History: Family History  Problem Relation Age of Onset  . Cancer Mother   . Arthritis Mother   . Diabetes Mother   . Stroke Mother   . Hypertension Mother   . Kidney disease Mother   . Kidney failure Mother   . Hematuria Mother   .  Cancer Father   . Arthritis Father   . COPD Father   . Hypertension Father   . Prostate cancer Father   . Hematuria Father     Social History:  reports that he has quit smoking. His smoking use included cigarettes. His smokeless tobacco use includes chew. He reports that he does not drink alcohol or use drugs.  ROS: UROLOGY Frequent Urination?: Yes Hard to postpone urination?: Yes Burning/pain with urination?: No Get up at night to urinate?: Yes Leakage of urine?: Yes Urine stream starts and stops?: Yes Trouble starting stream?: Yes Do you have to strain to urinate?: No Blood in urine?: No Urinary tract infection?: No Sexually transmitted disease?: No Injury to kidneys or bladder?: Yes Painful intercourse?: No Weak stream?: Yes Erection problems?: Yes Penile pain?: No  Gastrointestinal Nausea?: Yes Vomiting?: Yes Indigestion/heartburn?: Yes Diarrhea?: No Constipation?: Yes  Constitutional Fever: Yes Night sweats?: Yes Weight loss?: Yes Fatigue?: Yes  Skin Skin rash/lesions?: Yes Itching?: Yes  Eyes Blurred vision?: Yes Double vision?: No  Ears/Nose/Throat Sore throat?: No Sinus problems?: Yes  Hematologic/Lymphatic Swollen glands?: Yes Easy bruising?: Yes  Cardiovascular Leg swelling?: Yes Chest pain?: No  Respiratory Cough?: Yes Shortness of breath?: Yes  Endocrine Excessive thirst?: No  Musculoskeletal Back pain?: Yes Joint pain?: Yes  Neurological Headaches?: Yes Dizziness?: No  Psychologic Depression?: No Anxiety?: No  Physical Exam: BP 128/72   Pulse 74   Ht 5\' 11"  (1.803 m)    Wt 270 lb (122.5 kg)   BMI 37.66 kg/m    Constitutional:  Alert and oriented, No acute distress. HEENT: [place] AT, moist mucus membranes.  Trachea midline, no masses. Cardiovascular: No clubbing, cyanosis, or edema. Respiratory: Normal respiratory effort, no increased work of breathing. GI: Abdomen is soft, nontender, nondistended, no abdominal masses GU: No CVA tenderness.  Penis without lesions, testes descended bilaterally without masses or tenderness with mild atrophy.  Prostate 40 g, smooth without nodules.  Mild tenderness.  No pelvic floor tenderness noted Lymph: No cervical or inguinal lymphadenopathy. Skin: No rashes, bruises or suspicious lesions. Neurologic: Grossly intact, no focal deficits, moving all 4 extremities. Psychiatric: Normal mood and affect.  Laboratory Data:  Lab Results  Component Value Date   CREATININE 1.26 (H) 12/06/2016    Urinalysis Dipstick: Negative Microscopy: Negative  Assessment & Plan:   62 year old male with chronic pelvic pain and lower urinary tract symptoms.  Urinalysis today was unremarkable.  Potential etiologies were discussed including chronic prostatitis/chronic pelvic pain syndrome.  Will initially treat with an alpha blocker and Rx alfuzosin was sent to his pharmacy based on his insurance formulary.  He also has a Bosniak 82F renal lesion and is past due for follow-up imaging is recommended.  A follow-up MRI of the abdomen was ordered.  Return in about 4 weeks (around 02/10/2018) for Recheck.   Abbie Sons, [place] [address] [place],  [id number] [phone]

## 2018-01-16 ENCOUNTER — Encounter: Payer: Self-pay | Admitting: Urology

## 2018-01-16 DIAGNOSIS — R3915 Urgency of urination: Secondary | ICD-10-CM | POA: Insufficient documentation

## 2018-01-16 DIAGNOSIS — R102 Pelvic and perineal pain: Secondary | ICD-10-CM | POA: Insufficient documentation

## 2018-01-16 DIAGNOSIS — N281 Cyst of kidney, acquired: Secondary | ICD-10-CM | POA: Insufficient documentation

## 2018-01-24 DIAGNOSIS — L309 Dermatitis, unspecified: Secondary | ICD-10-CM | POA: Diagnosis not present

## 2018-01-24 DIAGNOSIS — L940 Localized scleroderma [morphea]: Secondary | ICD-10-CM | POA: Diagnosis not present

## 2018-01-24 DIAGNOSIS — L820 Inflamed seborrheic keratosis: Secondary | ICD-10-CM | POA: Diagnosis not present

## 2018-01-24 DIAGNOSIS — D485 Neoplasm of uncertain behavior of skin: Secondary | ICD-10-CM | POA: Diagnosis not present

## 2018-01-24 DIAGNOSIS — Z85828 Personal history of other malignant neoplasm of skin: Secondary | ICD-10-CM | POA: Diagnosis not present

## 2018-01-24 DIAGNOSIS — Z08 Encounter for follow-up examination after completed treatment for malignant neoplasm: Secondary | ICD-10-CM | POA: Diagnosis not present

## 2018-01-31 ENCOUNTER — Ambulatory Visit
Admission: RE | Admit: 2018-01-31 | Discharge: 2018-01-31 | Disposition: A | Discharge status: Home / Self Care | Payer: PPO | Source: Ambulatory Visit | Attending: Urology | Admitting: Urology

## 2018-01-31 DIAGNOSIS — E349 Endocrine disorder, unspecified: Secondary | ICD-10-CM | POA: Diagnosis not present

## 2018-01-31 DIAGNOSIS — N289 Disorder of kidney and ureter, unspecified: Secondary | ICD-10-CM | POA: Diagnosis not present

## 2018-01-31 DIAGNOSIS — N281 Cyst of kidney, acquired: Secondary | ICD-10-CM | POA: Diagnosis not present

## 2018-01-31 DIAGNOSIS — G4730 Sleep apnea, unspecified: Secondary | ICD-10-CM | POA: Diagnosis not present

## 2018-01-31 DIAGNOSIS — R5382 Chronic fatigue, unspecified: Secondary | ICD-10-CM | POA: Diagnosis not present

## 2018-01-31 LAB — POCT I-STAT CREATININE: CREATININE: 1.2 mg/dL (ref 0.61–1.24)

## 2018-01-31 MED ORDER — GADOBENATE DIMEGLUMINE 529 MG/ML IV SOLN
20.0000 mL | Freq: Once | INTRAVENOUS | Status: AC | PRN
  Administered 2018-01-31: 20 mL via INTRAVENOUS

## 2018-02-04 DIAGNOSIS — R0602 Shortness of breath: Secondary | ICD-10-CM | POA: Diagnosis not present

## 2018-02-05 ENCOUNTER — Other Ambulatory Visit: Payer: Self-pay | Admitting: Specialist

## 2018-02-05 DIAGNOSIS — R0602 Shortness of breath: Secondary | ICD-10-CM | POA: Diagnosis not present

## 2018-02-05 DIAGNOSIS — R0789 Other chest pain: Secondary | ICD-10-CM | POA: Diagnosis not present

## 2018-02-05 DIAGNOSIS — G4733 Obstructive sleep apnea (adult) (pediatric): Secondary | ICD-10-CM | POA: Diagnosis not present

## 2018-02-06 IMAGING — MR MR CERVICAL SPINE W/O CM
13 of 16 series · 37 of 48 positions shown · non-contrast
Comparison: MRI C-spine 12/15/2014.  MRI lumbar spine 04/03/2012.

CLINICAL DATA: Back pain for 20 years. BILATERAL arm pain with
numbness and tingling. BILATERAL leg pain with weakness.

EXAM:
MRI CERVICAL, THORACIC AND LUMBAR SPINE WITHOUT CONTRAST
TECHNIQUE: Multiplanar and multiecho pulse sequences of the cervical spine, to
include the craniocervical junction and cervicothoracic junction,
and thoracic and lumbar spine, were obtained without intravenous
contrast.

[Series 3: T2 · sagittal · 3.0mm · 0.70mm/px · 3 of 15 slices shown (1 of 6)]
[im 1/15]
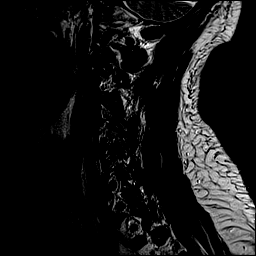
[im 8/15]
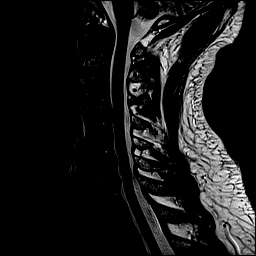
[im 15/15]
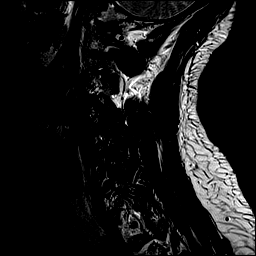

[Series 4: T1 · sagittal · 3.0mm · 0.70mm/px · 2 of 15 slices shown (1 of 4)]
[im 1/15]
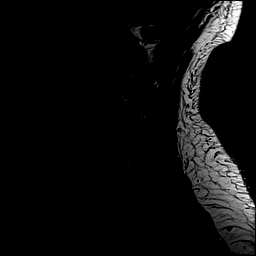
[im 15/15]
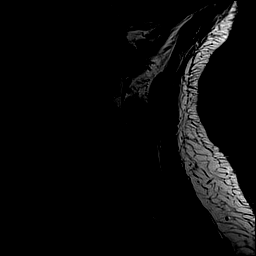

[Series 5: STIR · sagittal · 3.0mm · 0.35mm/px · 2 of 15 slices shown (1 of 3)]
[im 1/15]
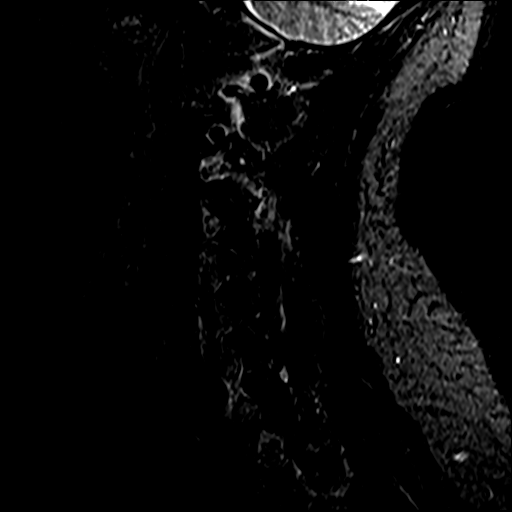
[im 15/15]
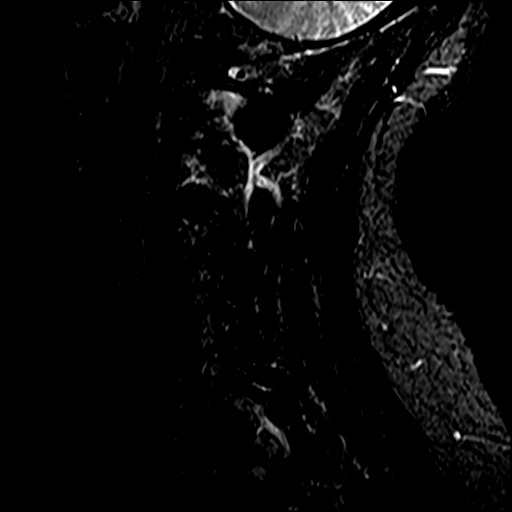

[Series 6: T2 · axial · 3.0mm · 0.70mm/px · z∈[-29,+77]mm · 4 of 29 slices shown (2 of 6)]
[im 1/29]
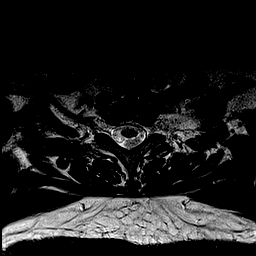
[im 10/29]
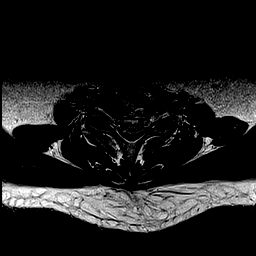
[im 19/29]
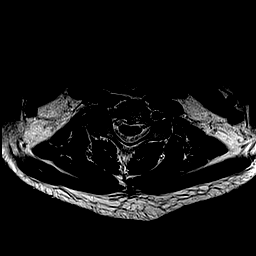
[im 29/29]
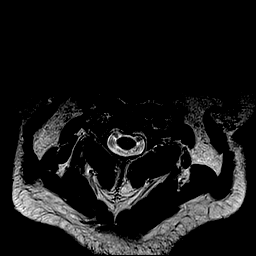

[Series 10: T2 · sagittal · 4.0mm · 1.06mm/px · 2 of 17 slices shown (3 of 6)]
[im 1/17]
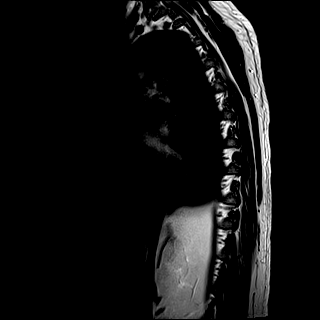
[im 17/17]
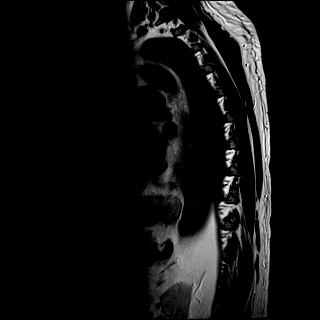

[Series 11: T1 · sagittal · 4.0mm · 1.06mm/px · 2 of 17 slices shown (2 of 4)]
[im 1/17]
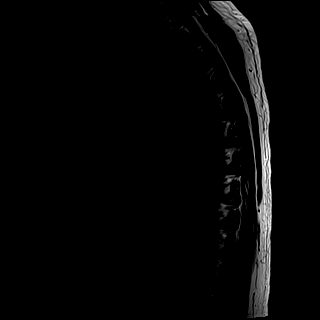
[im 17/17]
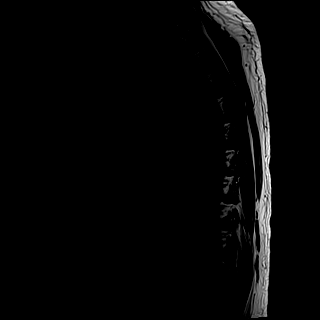

[Series 12: STIR · sagittal · 4.0mm · 1.33mm/px · 2 of 17 slices shown (2 of 3)]
[im 1/17]
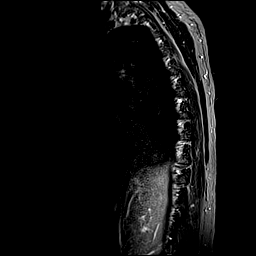
[im 17/17]
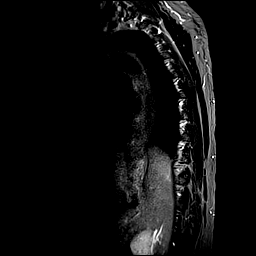

[Series 13: T2 · axial · 5.0mm · 0.86mm/px · z∈[-302,-34]mm · 5 of 40 slices shown (4 of 6)]
[im 1/40]
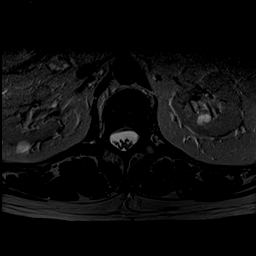
[im 10/40]
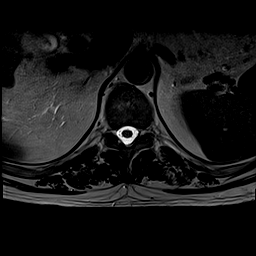
[im 20/40]
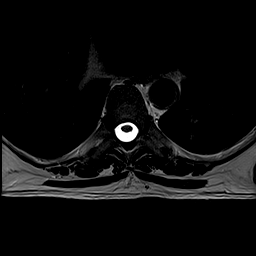
[im 30/40]
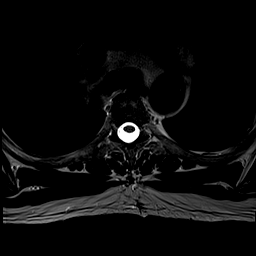
[im 40/40]
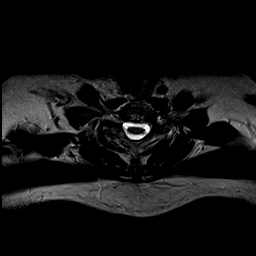

[Series 16: T2 · sagittal · 4.0mm · 0.81mm/px · 2 of 19 slices shown (5 of 6)]
[im 1/19]
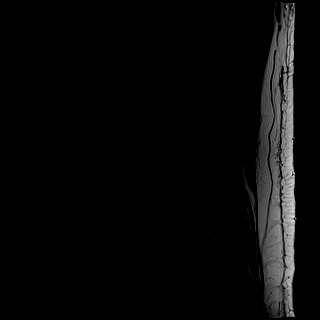
[im 19/19]
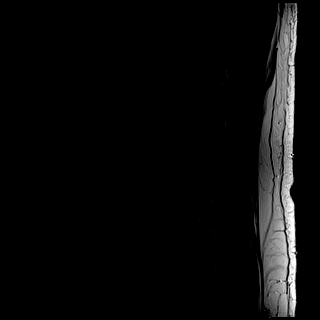

[Series 17: T1 · sagittal · 4.0mm · 0.81mm/px · 2 of 19 slices shown (3 of 4)]
[im 1/19]
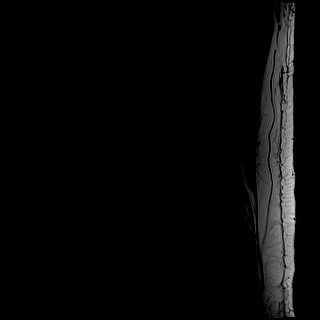
[im 19/19]
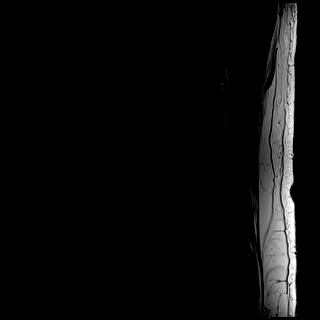

[Series 18: STIR · sagittal · 4.0mm · 1.02mm/px · 1 of 19 slices shown (3 of 3)]
[im 1/19]
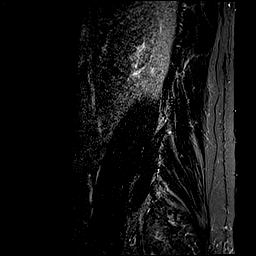

[Series 19: T2 · axial · 4.0mm · 0.78mm/px · z∈[-504,-291]mm · 5 of 40 slices shown (6 of 6)]
[im 1/40]
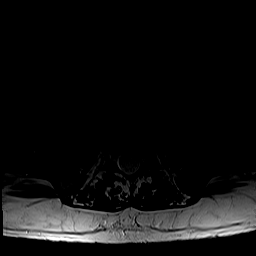
[im 10/40]
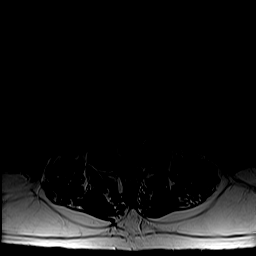
[im 20/40]
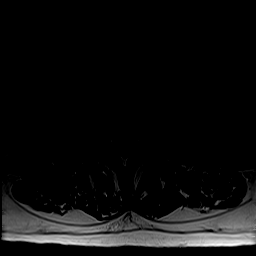
[im 30/40]
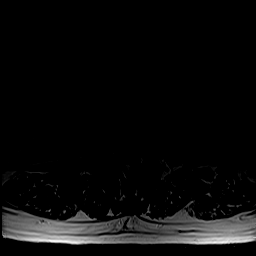
[im 40/40]
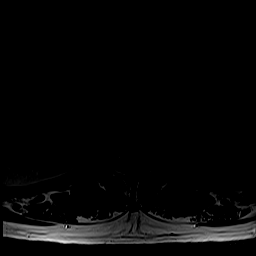

[Series 20: T1 · axial · 4.0mm · 0.39mm/px · z∈[-504,-291]mm · 5 of 40 slices shown (4 of 4)]
[im 1/40]
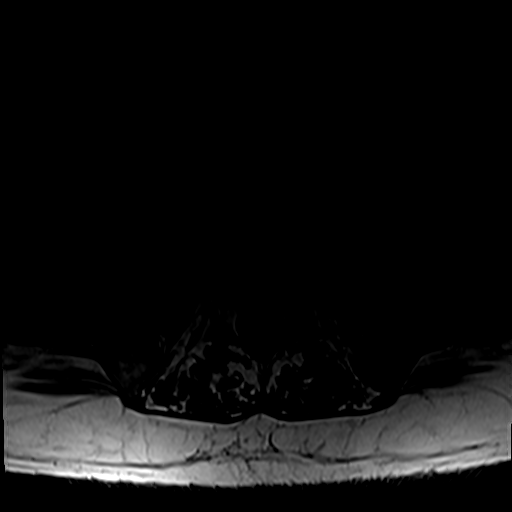
[im 10/40]
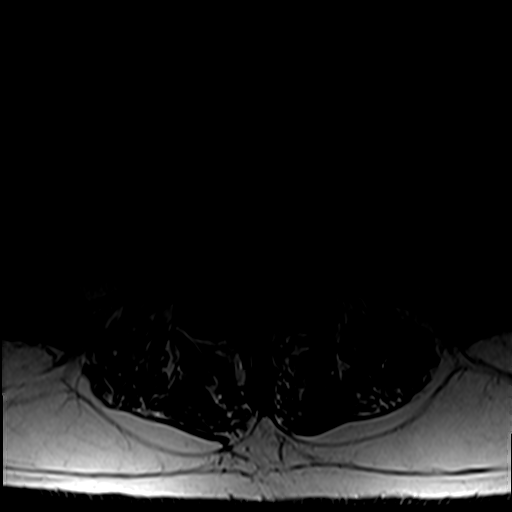
[im 20/40]
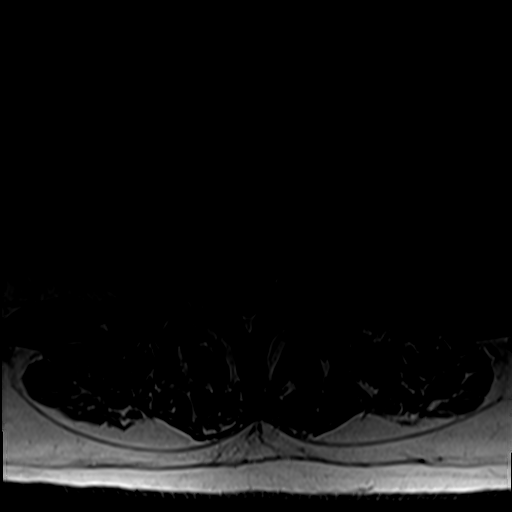
[im 30/40]
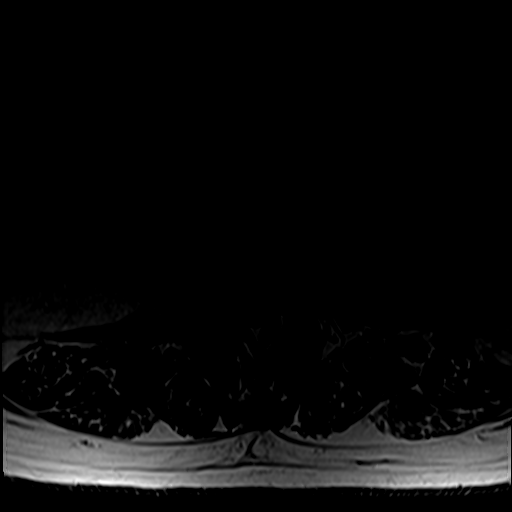
[im 40/40]
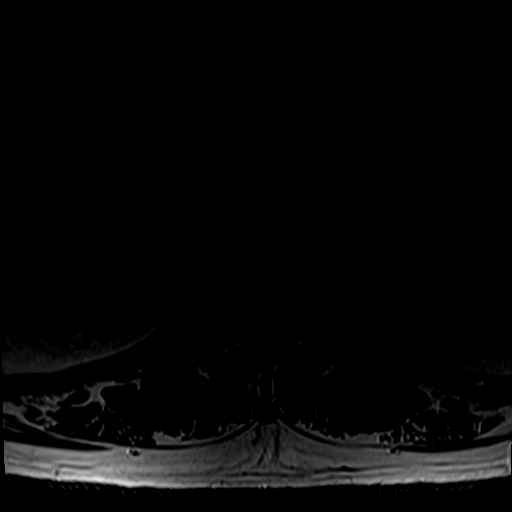

[37 of 48 positions shown; findings below may reference images not displayed]

FINDINGS: MRI CERVICAL SPINE FINDINGS

Alignment: 2 mm anterolisthesis C3-4 is facet mediated. Trace
anterolisthesis C7-T1.

Vertebrae: No worrisome osseous lesion. Endplate reactive changes
C6-7 have progressed from priors.

Cord: Cord flattening at C3-4, C4-5, and C5-6 without abnormal cord
signal.

Posterior Fossa, vertebral arteries, paraspinal tissues:
Unremarkable.

Disc levels:

C2-3: Annular bulge. LEFT-sided facet arthropathy. No impingement.

C3-4: 2 mm anterolisthesis is facet mediated. Central protrusion.
RIGHT greater than LEFT facet arthropathy. Mild stenosis with Cord
flattening without significant foraminal narrowing.

C4-5: Annular bulge. Mild Stenosis with cord flattening. No
foraminal narrowing.

C5-6: Central protrusion. Osseous ridging. Mild stenosis. Cord
flattening. BILATERAL RIGHT greater than LEFT C6 foraminal
narrowing.

C6-7: Severe disc space narrowing. Bony overgrowth results in mild
stenosis BILATERAL RIGHT greater than LEFT foraminal narrowing.

C7-T1: Annular bulge. Trace anterolisthesis. Facet arthropathy
without definite impingement.

Compared with prior MRI cervical spine exam, there is slight
progression of disease at multiple levels.

MRI THORACIC SPINE FINDINGS

Alignment:  Anatomic

Vertebrae: No fracture, evidence of discitis, or bone lesion.

Cord:  Normal signal and morphology.

Paraspinal and other soft tissues: Negative.

Disc levels:

Minor disc pathology at T6-7, T7-8, T8-9, T9-10, and T10-11 not
clearly compressive. Rightward protrusion at T12-L1 does not affect
the conus.

MRI LUMBAR SPINE FINDINGS

Segmentation:  Standard.

Alignment:  Physiologic.

Vertebrae: No worrisome osseous lesion. Endplate reactive changes
most pronounced above and below L2-L3.

Conus medullaris: Extends to the L1 level and appears normal.

Paraspinal and other soft tissues: Unremarkable.

Disc levels:

L1-L2: Advanced disc space narrowing. Central protrusion. Facet
arthropathy. Mild stenosis. Subarticular zone and foraminal zone
narrowing could affect the L1 and L2 nerve roots.

L2-L3: Severe disc space narrowing. Osseous ridging. Central and
leftward protrusion. Facet arthropathy. Mild to moderate stenosis.
LEFT L2 and LEFT L3 nerve root impingement are likely.

L3-L4: Moderate disc space narrowing. Central protrusion with
osseous ridging. Facet arthropathy. Mild to moderate stenosis.
BILATERAL subarticular zone and foraminal zone narrowing could
affect the L3 and L4 nerve roots.

L4-L5: Central protrusion. Laminotomy defect on the RIGHT. Facet
arthropathy and ligamentum flavum hypertrophy. Mild stenosis.
Subarticular zone and foraminal zone narrowing could affect the L5
and L4 nerve roots.

L5-S1: Disc space narrowing. Central and leftward protrusion.
Osseous ridging. BILATERAL foraminal narrowing could affect either
L5 nerve root, slightly worse on the RIGHT. Subarticular zone
narrowing on the LEFT could affect the S1 nerve root.

Compared with prior lumbar spine exam, there is progression of
degenerative disc disease at multiple levels.
IMPRESSION: Multilevel cervical spondylosis as described. Mild to moderate
spinal stenosis and foraminal narrowing could be symptomatic at
multiple levels.

No dominant thoracic spine abnormality or concerning areas of
stenosis are evident.

Multilevel lumbar spondylosis; multiple disc protrusions, in
conjunction with bony overgrowth and disc space narrowing could be
symptomatic at almost every level.

## 2018-02-11 ENCOUNTER — Ambulatory Visit
Admission: RE | Admit: 2018-02-11 | Discharge: 2018-02-11 | Disposition: A | Discharge status: Home / Self Care | Payer: PPO | Source: Ambulatory Visit | Attending: Specialist | Admitting: Specialist

## 2018-02-11 DIAGNOSIS — R0602 Shortness of breath: Secondary | ICD-10-CM | POA: Insufficient documentation

## 2018-02-12 ENCOUNTER — Ambulatory Visit (INDEPENDENT_AMBULATORY_CARE_PROVIDER_SITE_OTHER): Payer: PPO | Admitting: Urology

## 2018-02-12 ENCOUNTER — Encounter: Payer: Self-pay | Admitting: Urology

## 2018-02-12 VITALS — BP 114/75 | HR 76 | Resp 17 | Ht 71.0 in | Wt 267.8 lb

## 2018-02-12 DIAGNOSIS — E291 Testicular hypofunction: Secondary | ICD-10-CM | POA: Diagnosis not present

## 2018-02-12 DIAGNOSIS — R3915 Urgency of urination: Secondary | ICD-10-CM | POA: Diagnosis not present

## 2018-02-12 LAB — URINALYSIS, COMPLETE
BILIRUBIN UA: NEGATIVE
Glucose, UA: NEGATIVE
LEUKOCYTES UA: NEGATIVE
Nitrite, UA: NEGATIVE
PH UA: 5 (ref 5.0–7.5)
PROTEIN UA: NEGATIVE
RBC UA: NEGATIVE
Specific Gravity, UA: >1.03 — ABNORMAL HIGH (ref 1.005–1.030)
UUROB: 0.2 mg/dL (ref 0.2–1.0)

## 2018-02-12 LAB — MICROSCOPIC EXAMINATION
EPITHELIAL CELLS (NON RENAL): NONE SEEN /HPF (ref 0–10)
RBC, UA: NONE SEEN /hpf (ref 0–2)

## 2018-02-12 MED ORDER — MIRABEGRON ER 50 MG PO TB24: 50.0000 mg | ORAL_TABLET | Freq: Every day | ORAL | 0 refills | Status: DC

## 2018-02-12 NOTE — Progress Notes (Signed)
02/12/2018 1:47 PM   James [name] 03-19-1956 [id number]  Referring provider: [name], MD [phone] [place], [place] [address]  Chief Complaint  Patient presents with  . Follow-up    HPI: 62 year old male presents for follow-up of pelvic pain.  Refer to my previous note of 01/13/2018.  He saw no improvement in his symptoms with the alpha-blocker.  He had his follow-up MRI performed on 01/31/2018 which showed a stable 15 mm lesion in the lower pole of the right kidney that showed no evidence of enhancement.  The radiologist did review prior spinal MRIs from 2013 and 2016 and felt this is most likely a benign hemorrhagic cyst.  A one-year follow-up MRI was however recommended.  He has also been off testosterone since January to see if this made any difference in his discomfort.  He has not and he was interested in restarting.  We had discussed obtaining pituitary hormones prior to restarting TRT.   PMH: Past Medical History:  Diagnosis Date  . Allergy   . Anemia   . Arthritis   . Asthma   . Blood transfusion without reported diagnosis   . Bronchitis   . Bursitis   . Cancer ([place])    skin  . Cataract    bilateral  . 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 Gastritis   . GERD (gastroesophageal reflux disease)   . Heart murmur   . Hepatitis C   . Hiatal hernia   . Hypertension   . Hypothyroidism   . Kidney stone   . Lupus ([place])   . Obesity   . Osteoporosis   . Peripheral nerve disease   . Psoriatic arthritis ([place])   . Sleep apnea   . Supraventricular tachycardia ([place])   . Tendonitis   . Thyroid disease     Surgical History: Past Surgical History:  Procedure Laterality Date  . COLONOSCOPY  2009   in [place]  . ESOPHAGOGASTRODUODENOSCOPY (EGD) WITH PROPOFOL N/A 01/20/2016   Procedure: ESOPHAGOGASTRODUODENOSCOPY (EGD) WITH PROPOFOL;  Surgeon: Lollie Sails, MD;  Location: [facility] ENDOSCOPY;  Service: Endoscopy;   Laterality: N/A;  . EYE SURGERY     removal of foreign object  . FRACTURE SURGERY Right    arm  . FRACTURE SURGERY Bilateral    fingers  . FRACTURE SURGERY Bilateral    wrist  . JOINT REPLACEMENT Right    radial head  . PATELLA ARTHROPLASTY    . SHOULDER ARTHROSCOPY Bilateral   . SPINAL CORD STIMULATOR IMPLANT    . SPINAL CORD STIMULATOR REMOVAL    . SPINE SURGERY     cervical and thoracic and lumbar    Home Medications:  Allergies as of 02/12/2018      Reactions   Penicillin G Rash   Penicillins Rash   tachycardia   Lactose Other (See Comments)   Other reaction(s): Unknown Stomach bleed   Nsaids Nausea Only, Other (See Comments)   GI BLEED GI BLEED   Prednisone    Procaine    Other reaction(s): Other (See Comments) Tachycardia   Sulfasalazine    Other reaction(s): Other (See Comments) Severe joint pain   Talwin [pentazocine] Other (See Comments), Nausea Only   tachycardia   Codeine Rash   tachycardia   Sulfa Antibiotics Nausea Only, Rash   Other reaction(s): Unknown      Medication List        Accurate as of 02/12/18  1:47 PM. Always use your most  recent med list.          alfuzosin 10 MG 24 hr tablet Commonly known as:  UROXATRAL Take 1 tablet (10 mg total) by mouth daily with breakfast.   azelastine 0.1 % nasal spray Commonly known as:  ASTELIN Place 1 spray into both nostrils 2 (two) times daily.   budesonide-formoterol 80-4.5 MCG/ACT inhaler Commonly known as:  SYMBICORT Inhale 2 puffs into the lungs 2 (two) times daily.   cholecalciferol 1000 units tablet Commonly known as:  VITAMIN D Take 1,000 Units by mouth daily.   fentaNYL 12 MCG/HR Commonly known as:  DURAGESIC - dosed mcg/hr Place 1 patch (12.5 mcg total) onto the skin every 3 (three) days.   fentaNYL 12 MCG/HR Commonly known as:  DURAGESIC - dosed mcg/hr Place 1 patch (12.5 mcg total) onto the skin every 3 (three) days.   fentaNYL 12 MCG/HR Commonly known as:  DURAGESIC -  dosed mcg/hr   folic acid 1 MG tablet Commonly known as:  FOLVITE Take by mouth.   Loratadine 10 MG Caps Take 10 mg by mouth daily.   methotrexate 50 MG/2ML injection Inject 50 mg/m2 as directed once a week.   metoprolol tartrate 100 MG tablet Commonly known as:  LOPRESSOR TAKE 1 TABLET BY MOUTH TWICE A DAY   Oxycodone HCl 20 MG Tabs Take 1 tablet (20 mg total) by mouth every 8 (eight) hours.   Oxycodone HCl 20 MG Tabs Take 1 tablet (20 mg total) by mouth every 8 (eight) hours as needed.   pantoprazole 20 MG tablet Commonly known as:  PROTONIX Take 20 mg by mouth.   PROAIR HFA 108 (90 Base) MCG/ACT inhaler Generic drug:  albuterol Inhale 2 puffs into the lungs every 4 (four) hours as needed for wheezing or shortness of breath.   promethazine 12.5 MG tablet Commonly known as:  PHENERGAN Take by mouth every 4 (four) hours as needed.   sucralfate 1 g tablet Commonly known as:  CARAFATE Take 1 g by mouth 4 (four) times daily.   testosterone cypionate 200 MG/ML injection Commonly known as:  DEPOTESTOSTERONE CYPIONATE Inject into the muscle every 14 (fourteen) days.   tiZANidine 4 MG capsule Commonly known as:  ZANAFLEX Take 4 mg by mouth 3 (three) times daily as needed for muscle spasms.   VITAMIN B COMPLEX 100 IJ Inject as directed.       Allergies:  Allergies  Allergen Reactions  . Penicillin G Rash  . Penicillins Rash    tachycardia  . Lactose Other (See Comments)    Other reaction(s): Unknown Stomach bleed  . Nsaids Nausea Only and Other (See Comments)    GI BLEED GI BLEED  . Prednisone   . Procaine     Other reaction(s): Other (See Comments) Tachycardia  . Sulfasalazine     Other reaction(s): Other (See Comments) Severe joint pain  . Talwin [Pentazocine] Other (See Comments) and Nausea Only    tachycardia  . Codeine Rash    tachycardia  . Sulfa Antibiotics Nausea Only and Rash    Other reaction(s): Unknown    Family History: Family  History  Problem Relation Age of Onset  . Cancer Mother   . Arthritis Mother   . Diabetes Mother   . Stroke Mother   . Hypertension Mother   . Kidney disease Mother   . Kidney failure Mother   . Hematuria Mother   . Cancer Father   . Arthritis Father   . COPD Father   .  Hypertension Father   . Prostate cancer Father   . Hematuria Father     Social History:  reports that he has quit smoking. His smoking use included cigarettes. His smokeless tobacco use includes chew. He reports that he does not drink alcohol or use drugs.  ROS: UROLOGY Frequent Urination?: Yes Hard to postpone urination?: No Burning/pain with urination?: No Get up at night to urinate?: Yes Leakage of urine?: No Urine stream starts and stops?: Yes Trouble starting stream?: Yes Do you have to strain to urinate?: No Blood in urine?: No Urinary tract infection?: No Sexually transmitted disease?: No Injury to kidneys or bladder?: Yes Painful intercourse?: No Weak stream?: Yes Erection problems?: No Penile pain?: No  Gastrointestinal Nausea?: No Vomiting?: No Indigestion/heartburn?: No Diarrhea?: No Constipation?: Yes  Constitutional Fever: No Night sweats?: Yes Weight loss?: No Fatigue?: Yes  Skin Skin rash/lesions?: Yes Itching?: No  Eyes Blurred vision?: Yes Double vision?: No  Ears/Nose/Throat Sore throat?: No Sinus problems?: Yes  Hematologic/Lymphatic Swollen glands?: Yes Easy bruising?: Yes  Cardiovascular Leg swelling?: No Chest pain?: No  Respiratory Cough?: Yes Shortness of breath?: Yes  Endocrine Excessive thirst?: No  Musculoskeletal Back pain?: Yes Joint pain?: Yes  Neurological Headaches?: Yes Dizziness?: No  Psychologic Depression?: No Anxiety?: No  Physical Exam: BP 114/75   Pulse 76   Resp 17   Ht 5\' 11"  (1.803 m)   Wt 267 lb 12.8 oz (121.5 kg)   SpO2 95%   BMI 37.35 kg/m   Constitutional:  Alert and oriented, No acute distress. HEENT: [place]  AT, moist mucus membranes.  Trachea midline, no masses. Cardiovascular: No clubbing, cyanosis, or edema. Respiratory: Normal respiratory effort, no increased work of breathing. Skin: No rashes, bruises or suspicious lesions. Neurologic: Grossly intact, no focal deficits, moving all 4 extremities. Psychiatric: Normal mood and affect.  Laboratory Data: Lab Results  Component Value Date   CREATININE 1.20 01/31/2018    Assessment & Plan:   I did discuss with Mr. Vandyne the etiology of his pelvic pain may not be able to be identified however it does not appear to be anything serious.  I did discuss cystoscopy however he wanted to hold off.  His urine has been clear.  He does have multilevel degenerative disc disease and his pain may be neuropathic in etiology.  I did give him samples of Myrbetriq 50 mg daily to try to see if this had any improvement on his pelvic discomfort and urinary urgency.  Will also check a testosterone, LH and prolactin.  1. Urinary urgency  - Urinalysis, Complete  2. Hypogonadism in male  - Testosterone - Luteinizing hormone - Prolactin   Abbie Sons, MD  [facility] [address], [place], [place] [address] [phone]

## 2018-02-13 LAB — PROLACTIN: PROLACTIN: 16.5 ng/mL — AB (ref 4.0–15.2)

## 2018-02-13 LAB — TESTOSTERONE: TESTOSTERONE: 285 ng/dL (ref 264–916)

## 2018-02-13 LAB — LUTEINIZING HORMONE: LH: 7.1 m[IU]/mL (ref 1.7–8.6)

## 2018-02-14 ENCOUNTER — Other Ambulatory Visit: Payer: Self-pay

## 2018-02-17 ENCOUNTER — Telehealth: Payer: Self-pay | Admitting: Family Medicine

## 2018-02-17 NOTE — Telephone Encounter (Signed)
-----   Message from Abbie Sons, MD sent at 02/16/2018  9:13 AM EDT ----- Pituitary hormones showed no abnormalities.  The most likely cause of his low testosterone is chronic opiate therapy.

## 2018-02-17 NOTE — Telephone Encounter (Signed)
Patient notified and wants to know if there is anything he can do. He did notice that the Prolactin level is elevated, he is wanting to know if this makes a difference. 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

## 2018-02-18 NOTE — Telephone Encounter (Signed)
The only thing that could be done is to restart testosterone replacement therapy.  The diaphragm issue would not be associated with low testosterone.  It is unlikely it is associated with his lower abdominal pain.  His prolactin is only minimally elevated.  If he desires further evaluation would recommend Dr. Lovie Macadamia refer him to 1 of the endocrinologists at [facility]

## 2018-02-18 NOTE — Telephone Encounter (Signed)
Patient notified and will talk to Dr. Lovie Macadamia to discuss what to do next. He will contact us if he needs to.

## 2018-02-19 ENCOUNTER — Ambulatory Visit: Payer: PPO | Attending: Nurse Practitioner | Admitting: Nurse Practitioner

## 2018-02-19 ENCOUNTER — Encounter: Payer: Self-pay | Admitting: Nurse Practitioner

## 2018-02-19 ENCOUNTER — Other Ambulatory Visit: Payer: Self-pay

## 2018-02-19 ENCOUNTER — Telehealth: Payer: Self-pay | Admitting: *Deleted

## 2018-02-19 VITALS — BP 122/74 | HR 70 | Temp 98.1°F | Ht 72.0 in | Wt 270.0 lb

## 2018-02-19 DIAGNOSIS — L4050 Arthropathic psoriasis, unspecified: Secondary | ICD-10-CM | POA: Diagnosis not present

## 2018-02-19 DIAGNOSIS — Z882 Allergy status to sulfonamides status: Secondary | ICD-10-CM | POA: Insufficient documentation

## 2018-02-19 DIAGNOSIS — M79604 Pain in right leg: Secondary | ICD-10-CM | POA: Insufficient documentation

## 2018-02-19 DIAGNOSIS — G5641 Causalgia of right upper limb: Secondary | ICD-10-CM

## 2018-02-19 DIAGNOSIS — G8929 Other chronic pain: Secondary | ICD-10-CM

## 2018-02-19 DIAGNOSIS — E039 Hypothyroidism, unspecified: Secondary | ICD-10-CM | POA: Insufficient documentation

## 2018-02-19 DIAGNOSIS — M79601 Pain in right arm: Secondary | ICD-10-CM | POA: Insufficient documentation

## 2018-02-19 DIAGNOSIS — M79605 Pain in left leg: Secondary | ICD-10-CM | POA: Insufficient documentation

## 2018-02-19 DIAGNOSIS — M545 Low back pain: Secondary | ICD-10-CM | POA: Insufficient documentation

## 2018-02-19 DIAGNOSIS — R102 Pelvic and perineal pain: Secondary | ICD-10-CM | POA: Insufficient documentation

## 2018-02-19 DIAGNOSIS — M25562 Pain in left knee: Secondary | ICD-10-CM | POA: Diagnosis not present

## 2018-02-19 DIAGNOSIS — M7918 Myalgia, other site: Secondary | ICD-10-CM | POA: Insufficient documentation

## 2018-02-19 DIAGNOSIS — M5412 Radiculopathy, cervical region: Secondary | ICD-10-CM

## 2018-02-19 DIAGNOSIS — F1120 Opioid dependence, uncomplicated: Secondary | ICD-10-CM | POA: Diagnosis not present

## 2018-02-19 DIAGNOSIS — I491 Atrial premature depolarization: Secondary | ICD-10-CM | POA: Insufficient documentation

## 2018-02-19 DIAGNOSIS — G5640 Causalgia of unspecified upper limb: Secondary | ICD-10-CM | POA: Insufficient documentation

## 2018-02-19 DIAGNOSIS — I10 Essential (primary) hypertension: Secondary | ICD-10-CM | POA: Insufficient documentation

## 2018-02-19 DIAGNOSIS — J439 Emphysema, unspecified: Secondary | ICD-10-CM | POA: Insufficient documentation

## 2018-02-19 DIAGNOSIS — Z886 Allergy status to analgesic agent status: Secondary | ICD-10-CM | POA: Diagnosis not present

## 2018-02-19 DIAGNOSIS — Z880 Allergy status to penicillin: Secondary | ICD-10-CM | POA: Insufficient documentation

## 2018-02-19 DIAGNOSIS — M47816 Spondylosis without myelopathy or radiculopathy, lumbar region: Secondary | ICD-10-CM | POA: Insufficient documentation

## 2018-02-19 DIAGNOSIS — E119 Type 2 diabetes mellitus without complications: Secondary | ICD-10-CM | POA: Diagnosis not present

## 2018-02-19 DIAGNOSIS — Z79899 Other long term (current) drug therapy: Secondary | ICD-10-CM | POA: Insufficient documentation

## 2018-02-19 DIAGNOSIS — G629 Polyneuropathy, unspecified: Secondary | ICD-10-CM | POA: Diagnosis not present

## 2018-02-19 DIAGNOSIS — R079 Chest pain, unspecified: Secondary | ICD-10-CM | POA: Insufficient documentation

## 2018-02-19 DIAGNOSIS — M25561 Pain in right knee: Secondary | ICD-10-CM | POA: Diagnosis not present

## 2018-02-19 DIAGNOSIS — R0602 Shortness of breath: Secondary | ICD-10-CM | POA: Diagnosis not present

## 2018-02-19 DIAGNOSIS — G894 Chronic pain syndrome: Secondary | ICD-10-CM | POA: Diagnosis not present

## 2018-02-19 DIAGNOSIS — Z885 Allergy status to narcotic agent status: Secondary | ICD-10-CM | POA: Insufficient documentation

## 2018-02-19 DIAGNOSIS — M47812 Spondylosis without myelopathy or radiculopathy, cervical region: Secondary | ICD-10-CM | POA: Insufficient documentation

## 2018-02-19 DIAGNOSIS — Z888 Allergy status to other drugs, medicaments and biological substances status: Secondary | ICD-10-CM | POA: Insufficient documentation

## 2018-02-19 MED ORDER — FENTANYL 12 MCG/HR TD PT72: 12.5000 ug | MEDICATED_PATCH | TRANSDERMAL | 0 refills | Status: DC

## 2018-02-19 MED ORDER — NALOXONE HCL 4 MG/0.1ML NA LIQD: NASAL | 0 refills | Status: DC

## 2018-02-19 MED ORDER — OXYCODONE HCL 20 MG PO TABS: 20.0000 mg | ORAL_TABLET | Freq: Three times a day (TID) | ORAL | 0 refills | Status: DC

## 2018-02-19 MED ORDER — FENTANYL 12 MCG/HR TD PT72: 12.0000 ug | MEDICATED_PATCH | TRANSDERMAL | 0 refills | Status: DC

## 2018-02-19 MED ORDER — OXYCODONE HCL 20 MG PO TABS: 20.0000 mg | ORAL_TABLET | Freq: Three times a day (TID) | ORAL | 0 refills | Status: DC | PRN

## 2018-02-19 NOTE — Patient Instructions (Addendum)
____________________________________________________________________________________________  Medication Rules  Applies to: All patients receiving prescriptions (written or electronic).  Pharmacy of record: Pharmacy where electronic prescriptions will be sent. If written prescriptions are taken to a different pharmacy, please inform the nursing staff. The pharmacy listed in the electronic medical record should be the one where you would like electronic prescriptions to be sent.  Prescription refills: Only during scheduled appointments. Applies to both, written and electronic prescriptions.  NOTE: The following applies primarily to controlled substances (Opioid* Pain Medications).   Patient's responsibilities: 1. Pain Pills: Bring all pain pills to every appointment (except for procedure appointments). 2. Pill Bottles: Bring pills in original pharmacy bottle. Always bring newest bottle. Bring bottle, even if empty. 3. Medication refills: You are responsible for knowing and keeping track of what medications you need refilled. The day before your appointment, write a list of all prescriptions that need to be refilled. Bring that list to your appointment and give it to the admitting nurse. Prescriptions will be written only during appointments. If you forget a medication, it will not be "Called in", "Faxed", or "electronically sent". You will need to get another appointment to get these prescribed. 4. Prescription Accuracy: You are responsible for carefully inspecting your prescriptions before leaving our office. Have the discharge nurse carefully go over each prescription with you, before taking them home. Make sure that your name is accurately spelled, that your address is correct. Check the name and dose of your medication to make sure it is accurate. Check the number of pills, and the written instructions to make sure they are clear and accurate. Make sure that you are given enough medication to last  until your next medication refill appointment. 5. Taking Medication: Take medication as prescribed. Never take more pills than instructed. Never take medication more frequently than prescribed. Taking less pills or less frequently is permitted and encouraged, when it comes to controlled substances (written prescriptions).  6. Inform other Doctors: Always inform, all of your healthcare providers, of all the medications you take. 7. Pain Medication from other Providers: You are not allowed to accept any additional pain medication from any other Doctor or Healthcare provider. There are two exceptions to this rule. (see below) In the event that you require additional pain medication, you are responsible for notifying us, as stated below. 8. Medication Agreement: You are responsible for carefully reading and following our Medication Agreement. This must be signed before receiving any prescriptions from our practice. Safely store a copy of your signed Agreement. Violations to the Agreement will result in no further prescriptions. (Additional copies of our Medication Agreement are available upon request.) 9. Laws, Rules, & Regulations: All patients are expected to follow all Federal and State Laws, Statutes, Rules, & Regulations. Ignorance of the Laws does not constitute a valid excuse. The use of any illegal substances is prohibited. 10. Adopted CDC guidelines & recommendations: Target dosing levels will be at or below 60 MME/day. Use of benzodiazepines** is not recommended.  Exceptions: There are only two exceptions to the rule of not receiving pain medications from other Healthcare Providers. 1. Exception #1 (Emergencies): In the event of an emergency (i.e.: accident requiring emergency care), you are allowed to receive additional pain medication. However, you are responsible for: As soon as you are able, call our office [phone], at any time of the day or night, and leave a message stating your name, the  date and nature of the emergency, and the name and dose of the medication   prescribed. In the event that your call is answered by a member of our staff, make sure to document and save the date, time, and the name of the person that took your information.  2. Exception #2 (Planned Surgery): In the event that you are scheduled by another doctor or dentist to have any type of surgery or procedure, you are allowed (for a period no longer than 30 days), to receive additional pain medication, for the acute post-op pain. However, in this case, you are responsible for picking up a copy of our "Post-op Pain Management for Surgeons" handout, and giving it to your surgeon or dentist. This document is available at our office, and does not require an appointment to obtain it. Simply go to our office during business hours (Monday-Thursday from 8:00 AM to 4:00 PM) (Friday 8:00 AM to 12:00 Noon) or if you have a scheduled appointment with [place], prior to your surgery, and ask for it by name. In addition, you will need to provide [place] with your name, name of your surgeon, type of surgery, and date of procedure or surgery.  *Opioid medications include: morphine, codeine, oxycodone, oxymorphone, hydrocodone, hydromorphone, meperidine, tramadol, tapentadol, buprenorphine, fentanyl, methadone. **Benzodiazepine medications include: diazepam (Valium), alprazolam (Xanax), clonazepam (Klonopine), lorazepam (Ativan), clorazepate (Tranxene), chlordiazepoxide (Librium), estazolam (Prosom), oxazepam (Serax), temazepam (Restoril), triazolam (Halcion) (Last updated: 01/02/2018) ____________________________________________________________________________________________   BMI Assessment: Estimated body mass index is 36.62 kg/m as calculated from the following:   Height as of this encounter: 6' (1.829 m).   Weight as of this encounter: 270 lb (122.5 kg).  BMI interpretation table: BMI level Category Range association with higher incidence  of chronic pain  <18 kg/m2 Underweight   18.5-24.9 kg/m2 Ideal body weight   25-29.9 kg/m2 Overweight Increased incidence by 20%  30-34.9 kg/m2 Obese (Class I) Increased incidence by 68%  35-39.9 kg/m2 Severe obesity (Class II) Increased incidence by 136%  >40 kg/m2 Extreme obesity (Class III) Increased incidence by 254%   BMI Readings from Last 4 Encounters:  02/19/18 36.62 kg/m  02/12/18 37.35 kg/m  01/13/18 37.66 kg/m  11/27/17 34.44 kg/m   Wt Readings from Last 4 Encounters:  02/19/18 270 lb (122.5 kg)  02/12/18 267 lb 12.8 oz (121.5 kg)  01/13/18 270 lb (122.5 kg)  11/27/17 240 lb (108.9 kg)

## 2018-02-19 NOTE — Telephone Encounter (Signed)
Spoke with DR Dossie Arbour per patient request about SCS for diaphragm paralysis.  Dr Dossie Arbour states that this would be a question for a thoracic surgeon.  Spoke back with patient and conveyed information that Dr Dossie Arbour gave me. Patient verbalizes information and will try and contact [employer] representative for further information.

## 2018-02-19 NOTE — Progress Notes (Signed)
Patient's Name: James [name]  MRN: [id number]  Referring Provider: [name], MD  DOB: December 03, 1955  PCP: James Pitch, MD  DOS: 02/19/2018  Note by: James Francois NP  Service setting: Ambulatory outpatient  Specialty: Interventional Pain Management  Location: [facility] (AMB) Pain Management Facility    Patient type: Established    Primary Reason(s) for Visit: Encounter for prescription drug management. (Level of risk: moderate)  CC: Back Pain (lower)  HPI  [name] is a 62 y.o. year old, male patient, who comes today for a medication management evaluation. He has Airway hyperreactivity; Atrial fibrillation ([place]); Narrowing of intervertebral disc space; Abnormal echocardiogram; Gastric ulcer; Gastric catarrh; Acid reflux; Awareness of heartbeats; Hemorrhoid; Hepatitis; BP (high blood pressure); Lactose intolerance; Osteopenia; APC (atrial premature contractions); Apnea, sleep; Supraventricular tachycardia ([place]); Long term current use of opiate analgesic; Long term prescription opiate use; Opiate use (120 MME/Day); Opiate dependence ([place]); Encounter for therapeutic drug level monitoring; Chronic low back pain (Primary Source of Pain) (Bilateral) (R>L); Lumbar spondylosis; Chronic neck pain (Right); Cervical spondylosis (C7 intravertebral body cyst); Chronic cervical radicular pain (Right); Chronic lumbar radicular pain (Bilateral) (L>R) (L5 Dermatome); Osteoarthritis; Chronic hip pain; Chronic knee pain (Bilateral) (R>L); Complex regional pain syndrome type II of upper extremity (Right); Chronic upper extremity pain (Right); 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); Epidural fibrosis; Musculoskeletal pain; Neurogenic pain; Neuropathic pain; Chronic sacroiliac joint pain  (Bilateral) (R>L); Psoriatic arthritis ([place]); Controlled type 2 diabetes mellitus without complication ([place]); Chest pain; Acquired hypothyroidism; Chronic lower extremity pain (Secondary source of pain) (Bilateral) (L>R); Psoriasis with arthropathy ([place]); Hypothyroidism; Numbness and tingling of both legs; Abnormal nerve conduction studies (06/20/16); Chronic pain syndrome; Hypotestosteronism; Nausea without vomiting; Acute postoperative pain; Perineal pain; Acquired cyst of kidney; and Urinary urgency on their problem list. His primarily concern today is the Back Pain (lower)  Pain Assessment: Location: Lower, Upper Back Radiating: radiates down both leg and arms  Onset: More than a month ago Duration: Chronic pain Quality: Aching, Burning, Stabbing, Sharp, Constant Severity: 4 /10 (self-reported pain score)  Note: Reported level is compatible with observation.                            Timing: Constant Modifying factors: meds, laying down  [name] was last scheduled for an appointment on 11/27/2017 for medication management. During today's appointment we reviewed [name] chronic pain status, as well as his outpatient medication regimen. He states that he pain that goes into his toes. He has numbness and tingling. He states that he has come concerns however is nothing that can be done at this time. He is wondering about diaphragm spinal cord stimulator that he has read about. He admits that he is not interested in any other procedures secondary to have a reaction of some sort to an injection on last year.   The patient  reports that he does not use drugs. His body mass index is 36.62 kg/m.  Further details on both, my assessment(s), as well as the proposed treatment plan, please see below.  Controlled Substance Pharmacotherapy Assessment REMS (Risk Evaluation and Mitigation Strategy)  Analgesic:Fentanyl patch 12.5 mcg/h every 72 hours + oxycodone IR 20 mg every 6 hours (60  mg/day) MME/day:120 mg/day     Chauncey Fischer, RN  02/19/2018  8:45 AM  Sign at close encounter Nursing Pain Medication Assessment:  Safety precautions to be maintained throughout the outpatient stay will include: orient to surroundings, keep bed in low position, maintain call bell within reach at all times, provide assistance with transfer out of bed and ambulation.  Medication Inspection Compliance: Pill count conducted under aseptic conditions, in front of the patient. Neither the pills nor the bottle was removed from the patient's sight at any time. Once count was completed pills were immediately returned to the patient in their original bottle.  Medication #1: Fentanyl patch Pill/Patch Count: 5 of 5 pills remain Pill/Patch Appearance: Markings consistent with prescribed medication Bottle Appearance: Standard pharmacy container. Clearly labeled. Filled Date: 4 / 4 / 2019 Last Medication intake:  Yesterday  Medication #2: Oxycodone IR Pill/Patch Count: 50 of 90 pills remain Pill/Patch Appearance: Markings consistent with prescribed medication Bottle Appearance: Standard pharmacy container. Clearly labeled. Filled Date: 04 / 04 / 2019 Last Medication intake:  Today   Pharmacokinetics: Liberation and absorption (onset of action): WNL Distribution (time to peak effect): WNL Metabolism and excretion (duration of action): WNL         Pharmacodynamics: Desired effects: Analgesia: [name] reports >50% benefit. Functional ability: Patient reports that medication allows him to accomplish basic ADLs Clinically meaningful improvement in function (CMIF): Sustained CMIF goals met Perceived effectiveness: Described as relatively effective, allowing for increase in activities of daily living (ADL) Undesirable effects: Side-effects or Adverse reactions: None reported Monitoring: [place] PMP: Online review of the past 13-monthperiod conducted. Compliant with practice rules and  regulations Last UDS on record: Summary  Date Value Ref Range Status  11/27/2017 FINAL  Final    Comment:    ==================================================================== TOXASSURE SELECT 13 (MW) ==================================================================== Test                             Result       Flag       Units Drug Present and Declared for Prescription Verification   Oxycodone                      3275         EXPECTED   ng/mg creat   Oxymorphone                    985          EXPECTED   ng/mg creat   Noroxycodone                   6016         EXPECTED   ng/mg creat   Noroxymorphone                 541          EXPECTED   ng/mg creat    Sources of oxycodone are scheduled prescription medications.    Oxymorphone, noroxycodone, and noroxymorphone are expected    metabolites of oxycodone. Oxymorphone is also available as a    scheduled prescription medication.   Fentanyl                       8            EXPECTED   ng/mg creat   Norfentanyl                    35  EXPECTED   ng/mg creat    Source of fentanyl is a scheduled prescription medication,    including IV, patch, and transmucosal formulations. Norfentanyl    is an expected metabolite of fentanyl. ==================================================================== Test                      Result    Flag   Units      Ref Range   Creatinine              158              mg/dL      >=20 ==================================================================== Declared Medications:  The flagging and interpretation on this report are based on the  following declared medications.  Unexpected results may arise from  inaccuracies in the declared medications.  **Note: The testing scope of this panel includes these medications:  Fentanyl (Fentanyl Patch)  Oxycodone  **Note: The testing scope of this panel does not include following  reported medications:  Albuterol  Azelastine  Budenoside (Symbicort)   Folic acid  Formoterol (Symbicort)  Loratadine  Methotrexate  Metoprolol (Lopressor)  Pantoprazole (Protonix)  Promethazine (Phenergan)  Sucralfate (Carafate)  Tizanidine ==================================================================== For clinical consultation, please call [phone]. ====================================================================    UDS interpretation: Compliant          Medication Assessment Form: Reviewed. Patient indicates being compliant with therapy Treatment compliance: Compliant Risk Assessment Profile: Aberrant behavior: See prior evaluations. None observed or detected today Comorbid factors increasing risk of overdose: See prior notes. No additional risks detected today Risk of substance use disorder (SUD): Low  ORT Scoring interpretation table:  Score <3 = Low Risk for SUD  Score between 4-7 = Moderate Risk for SUD  Score >8 = High Risk for Opioid Abuse   Risk Mitigation Strategies:  Patient Counseling: Covered Patient-Prescriber Agreement (PPA): Present and active  Notification to other healthcare providers: Done  Pharmacologic Plan: No change in therapy, at this time.             Laboratory Chemistry  Inflammation Markers (CRP: Acute Phase) (ESR: Chronic Phase) Lab Results  Component Value Date   CRP <0.80 12/06/2016   ESRSEDRATE 9 12/06/2016                         Rheumatology Markers No results found for: Elayne Guerin, [facility]                      Renal Function Markers Lab Results  Component Value Date   BUN 14 12/06/2016   CREATININE 1.20 01/31/2018   GFRAA >60 12/06/2016   GFRNONAA >60 12/06/2016                              Hepatic Function Markers Lab Results  Component Value Date   AST 35 12/06/2016   ALT 33 12/06/2016   ALBUMIN 4.4 12/06/2016   ALKPHOS 47 12/06/2016                        Electrolytes Lab Results  Component Value Date   NA 139 12/06/2016   K 4.6  12/06/2016   CL 103 12/06/2016   CALCIUM 9.6 12/06/2016   MG 2.2 12/06/2016  Neuropathy Markers Lab Results  Component Value Date   VITAMINB12 423 12/06/2016                        Bone Pathology Markers Lab Results  Component Value Date   25OHVITD1 42 12/06/2016   25OHVITD2 <1.0 12/06/2016   25OHVITD3 42 12/06/2016   TESTOSTERONE 285 02/12/2018                         Coagulation Parameters No results found for: INR, LABPROT, APTT, PLT, DDIMER                      Cardiovascular Markers No results found for: BNP, CKTOTAL, CKMB, TROPONINI, HGB, HCT                       CA Markers No results found for: CEA, CA125, LABCA2                      Note: Lab results reviewed.  Recent Diagnostic Imaging Results  DG Sniff Test CLINICAL DATA:  Increasing shortness of breath over the past several years. Found to have elevated right hemidiaphragm on previous CT scan. History of shrapnel wounds.  EXAM: CHEST FLUOROSCOPY  TECHNIQUE: Real-time fluoroscopic evaluation of the chest was performed.  FLUOROSCOPY TIME:  Fluoroscopy Time:  6 seconds  Radiation Exposure Index (if provided by the fluoroscopic device): 41.5 mGy  Number of Acquired Spot Images: 3 video loops  COMPARISON:  CT scan of the chest of January 24, 2017  FINDINGS: There is normal respiratory motion of the left hemidiaphragm. The mildly elevated right hemidiaphragm reveals paradoxical motion during deep inspiration and during the sniff maneuver.  IMPRESSION: Findings compatible with right hemidiaphragm paresis. No purposeful motion of the right hemidiaphragm is observed.  Electronically Signed   By: David  Martinique M.D.   On: 02/11/2018 09:10  Complexity Note: Imaging results reviewed. Results shared with Mr. Fedewa, using Layman's terms.                         Meds   Current Outpatient Medications:  .  albuterol (PROAIR HFA) 108 (90 BASE) MCG/ACT inhaler, Inhale 2 puffs into  the lungs every 4 (four) hours as needed for wheezing or shortness of breath. , Disp: , Rfl:  .  azelastine (ASTELIN) 0.1 % nasal spray, Place 1 spray into both nostrils 2 (two) times daily. , Disp: , Rfl:  .  B Complex Vitamins (VITAMIN B COMPLEX 100 IJ), Inject as directed., Disp: , Rfl:  .  budesonide-formoterol (SYMBICORT) 80-4.5 MCG/ACT inhaler, Inhale 2 puffs into the lungs 2 (two) times daily., Disp: , Rfl:  .  cholecalciferol (VITAMIN D) 1000 units tablet, Take 1,000 Units by mouth daily., Disp: , Rfl:  .  [START ON 04/07/2018] fentaNYL (DURAGESIC - DOSED MCG/HR) 12 MCG/HR, Place 1 patch (12.5 mcg total) onto the skin every 3 (three) days., Disp: 10 patch, Rfl: 0 .  folic acid (FOLVITE) 1 MG tablet, Take by mouth., Disp: , Rfl:  .  methotrexate 50 MG/2ML injection, Inject 50 mg/m2 as directed once a week. , Disp: , Rfl:  .  metoprolol (LOPRESSOR) 100 MG tablet, TAKE 1 TABLET BY MOUTH TWICE A DAY, Disp: , Rfl:  .  [START ON 03/08/2018] Oxycodone HCl 20 MG TABS, Take 1 tablet (20 mg total) by mouth every 8 (  eight) hours as needed., Disp: 90 tablet, Rfl: 0 .  pantoprazole (PROTONIX) 20 MG tablet, Take 20 mg by mouth., Disp: , Rfl:  .  promethazine (PHENERGAN) 12.5 MG tablet, Take by mouth every 4 (four) hours as needed. , Disp: , Rfl:  .  sucralfate (CARAFATE) 1 G tablet, Take 1 g by mouth 4 (four) times daily. , Disp: , Rfl:  .  tiZANidine (ZANAFLEX) 4 MG capsule, Take 4 mg by mouth 3 (three) times daily as needed for muscle spasms., Disp: , Rfl:  .  [START ON 05/07/2018] fentaNYL (DURAGESIC - DOSED MCG/HR) 12 MCG/HR, Place 1 patch (12.5 mcg total) onto the skin every 3 (three) days., Disp: 10 patch, Rfl: 0 .  [START ON 03/08/2018] fentaNYL (DURAGESIC - DOSED MCG/HR) 12 MCG/HR, Place 1 patch (12.5 mcg total) onto the skin every 3 (three) days., Disp: 10 patch, Rfl: 0 .  naloxone (NARCAN) nasal spray 4 mg/0.1 mL, Spray into one nostril. Repeat with second device into other nostril after 2-3 minutes if  no or minimal response. Use in case of opioid overdose., Disp: 1 kit, Rfl: 0 .  [START ON 04/07/2018] Oxycodone HCl 20 MG TABS, Take 1 tablet (20 mg total) by mouth every 8 (eight) hours., Disp: 90 tablet, Rfl: 0 .  [START ON 05/07/2018] Oxycodone HCl 20 MG TABS, Take 1 tablet (20 mg total) by mouth every 8 (eight) hours as needed., Disp: 90 tablet, Rfl: 0  ROS  Constitutional: Denies any fever or chills Gastrointestinal: No reported hemesis, hematochezia, vomiting, or acute GI distress Musculoskeletal: Denies any acute onset joint swelling, redness, loss of ROM, or weakness Neurological: No reported episodes of acute onset apraxia, aphasia, dysarthria, agnosia, amnesia, paralysis, loss of coordination, or loss of consciousness  Allergies  Mr. Ishler is allergic to penicillin g; penicillins; lactose; nsaids; prednisone; procaine; sulfasalazine; talwin [pentazocine]; codeine; and sulfa antibiotics.  [place]  Drug: Mr. Fei  reports that he does not use drugs. Alcohol:  reports that he does not drink alcohol. Tobacco:  reports that he has quit smoking. His smoking use included cigarettes. His smokeless tobacco use includes chew. 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 Surgical: Mr. Ureta  has a past surgical history that includes Spine surgery; Eye surgery; Fracture surgery (Right); Fracture surgery (Bilateral); Fracture surgery (Bilateral); Joint replacement  (Right); Spinal cord stimulator implant; Spinal cord stimulator removal; Patella arthroplasty; Shoulder arthroscopy (Bilateral); Esophagogastroduodenoscopy (egd) with propofol (N/A, 01/20/2016); and Colonoscopy (2009). Family: family history includes Arthritis in his father and mother; COPD in his father; Cancer in his father and mother; Diabetes in his mother; Hematuria in his father and mother; Hypertension in his father and mother; Kidney disease in his mother; Kidney failure in his mother; Prostate cancer in his father; Stroke in his mother.  Constitutional Exam  General appearance: Well nourished, well developed, and well hydrated. In no apparent acute distress Vitals:   02/19/18 0817  BP: 122/74  Pulse: 70  Temp: 98.1 F (36.7 C)  Weight: 270 lb (122.5 kg)  Height: 6' (1.829 m)  Psych/Mental status: Alert, oriented x 3 (person, place, & time)  Eyes: PERLA Respiratory: No evidence of acute respiratory distress  Cervical Spine Area Exam  Skin & Axial Inspection: No masses, redness, edema, swelling, or associated skin lesions Alignment: Symmetrical Functional ROM: Unrestricted ROM      Stability: No instability detected Muscle Tone/Strength: Functionally intact. No obvious neuro-muscular anomalies detected. Sensory (Neurological): Unimpaired Palpation: No palpable anomalies              Upper Extremity (UE) Exam    Side: Right upper extremity  Side: Left upper extremity  Skin & Extremity Inspection: Skin color, temperature, and hair growth are WNL. No peripheral edema or cyanosis. No masses, redness, swelling, asymmetry, or associated skin lesions. No contractures.  Skin & Extremity Inspection: Skin color, temperature, and hair growth are WNL. No peripheral edema or cyanosis. No masses, redness, swelling, asymmetry, or associated skin lesions. No contractures.  Functional ROM: Unrestricted ROM          Functional ROM: Unrestricted ROM          Muscle Tone/Strength: Functionally  intact. No obvious neuro-muscular anomalies detected.  Muscle Tone/Strength: Functionally intact. No obvious neuro-muscular anomalies detected.  Sensory (Neurological): Unimpaired          Sensory (Neurological): Unimpaired          Palpation: No palpable anomalies              Palpation: No palpable anomalies              Specialized Test(s): Deferred         Specialized Test(s): Deferred          Thoracic Spine Area Exam  Skin & Axial Inspection: Well healed scar from previous spine surgery detected Alignment: Symmetrical Functional ROM: Unrestricted ROM Stability: No instability detected Muscle Tone/Strength: Functionally intact. No obvious neuro-muscular anomalies detected. Sensory (Neurological): Unimpaired Muscle strength & Tone: Non-tender  Lumbar Spine Area Exam  Skin & Axial Inspection: Well healed scar from previous spine surgery detected Alignment: Symmetrical Functional ROM: Unrestricted ROM       Stability: No instability detected Muscle Tone/Strength: Functionally intact. No obvious neuro-muscular anomalies detected. Sensory (Neurological): Unimpaired Palpation: Complains of area being tender to palpation       Provocative Tests: Lumbar Hyperextension and rotation test: evaluation deferred today       Lumbar Lateral bending test: evaluation deferred today       Patrick's Maneuver: evaluation deferred today                    Gait & Posture Assessment  Ambulation: Unassisted Gait: Relatively normal for age and body habitus Posture: WNL   Lower Extremity Exam    Side: Right lower extremity  Side: Left lower extremity  Skin & Extremity Inspection: Skin color, temperature, and hair growth are WNL. No peripheral edema or cyanosis. No masses, redness, swelling, asymmetry, or associated skin lesions. No contractures.  Skin & Extremity Inspection: Skin color, temperature, and hair growth are WNL. No peripheral edema or cyanosis. No masses, redness, swelling, asymmetry, or  associated skin lesions. No contractures.  Functional ROM: Unrestricted ROM          Functional ROM: Unrestricted ROM          Muscle Tone/Strength: Functionally intact. No obvious neuro-muscular anomalies detected.  Muscle Tone/Strength: Functionally intact. No obvious neuro-muscular anomalies detected.  Sensory (Neurological): Unimpaired  Sensory (Neurological): Unimpaired  Palpation: No palpable anomalies  Palpation: No palpable anomalies   Assessment  Primary Diagnosis & Pertinent Problem List: The  primary encounter diagnosis was Lumbar spondylosis. Diagnoses of Chronic cervical radicular pain (Right), Complex regional pain syndrome type II of upper extremity (Right), and Chronic pain syndrome were also pertinent to this visit.  Status Diagnosis  Persistent Persistent Controlled 1. Lumbar spondylosis   2. Chronic cervical radicular pain (Right)   3. Complex regional pain syndrome type II of upper extremity (Right)   4. Chronic pain syndrome     Problems updated and reviewed during this visit: No problems updated. Plan of Care  Pharmacotherapy (Medications Ordered): Meds ordered this encounter  Medications  . Oxycodone HCl 20 MG TABS    Sig: Take 1 tablet (20 mg total) by mouth every 8 (eight) hours.    Dispense:  90 tablet    Refill:  0    Do not place this medication, or any other prescription from our practice, on "Automatic Refill". Patient may have prescription filled one day early if pharmacy is closed on scheduled refill date. Do not fill until:04/07/2018 To last until: 05/07/2018    Order Specific Question:   Supervising Provider    Answer:   Milinda Pointer [phone]  . Oxycodone HCl 20 MG TABS    Sig: Take 1 tablet (20 mg total) by mouth every 8 (eight) hours as needed.    Dispense:  90 tablet    Refill:  0    Do not place this medication, or any other prescription from our practice, on "Automatic Refill". Patient may have prescription filled one day early if pharmacy  is closed on scheduled refill date. Do not fill until: 03/08/2018 To last until: 04/07/2018    Order Specific Question:   Supervising Provider    Answer:   Milinda Pointer [phone]  . fentaNYL (DURAGESIC - DOSED MCG/HR) 12 MCG/HR    Sig: Place 1 patch (12.5 mcg total) onto the skin every 3 (three) days.    Dispense:  10 patch    Refill:  0    Do not place this medication, or any other prescription from our practice, on "Automatic Refill". Patient may have prescription filled one day early if pharmacy is closed on scheduled refill date. Do not fill until:05/07/2018 To last until: 06/06/2018    Order Specific Question:   Supervising Provider    Answer:   Milinda Pointer [phone]  . fentaNYL (DURAGESIC - DOSED MCG/HR) 12 MCG/HR    Sig: Place 1 patch (12.5 mcg total) onto the skin every 3 (three) days.    Dispense:  10 patch    Refill:  0    DO NOT DELETE, even if Expired!!! Do not place this medication, or any other prescription from our practice, on "Automatic Refill". Patient may have prescription filled one day early if pharmacy is closed on scheduled refill date. Do not fill until: 04/07/2018 To last until:05/07/2018    Order Specific Question:   Supervising Provider    Answer:   Milinda Pointer [phone]  . fentaNYL (DURAGESIC - DOSED MCG/HR) 12 MCG/HR    Sig: Place 1 patch (12.5 mcg total) onto the skin every 3 (three) days.    Dispense:  10 patch    Refill:  0    Do not place this medication, or any other prescription from our practice, on "Automatic Refill". Patient may have prescription filled one day early if pharmacy is closed on scheduled refill date. Do not fill until: 03/08/2018 To last until:04/07/2018    Order Specific Question:   Supervising Provider    Answer:   Milinda Pointer [  [id number]]  . naloxone (NARCAN) nasal spray 4 mg/0.1 mL    Sig: Spray into one nostril. Repeat with second device into other nostril after 2-3 minutes if no or minimal response. Use in case of opioid  overdose.    Dispense:  1 kit    Refill:  0    Narcan Nasal Spray. (2 pack) Please provide the patient with clear instructions on the use of this device/medication.    Order Specific Question:   Supervising Provider    Answer:   Milinda Pointer [phone]  . Oxycodone HCl 20 MG TABS    Sig: Take 1 tablet (20 mg total) by mouth every 8 (eight) hours as needed.    Dispense:  90 tablet    Refill:  0    Do not place this medication, or any other prescription from our practice, on "Automatic Refill". Patient may have prescription filled one day early if pharmacy is closed on scheduled refill date. Do not fill until: 05/07/2018 To last until:06/06/2018    Order Specific Question:   Supervising Provider    Answer:   Milinda Pointer [phone]   New Prescriptions   NALOXONE (NARCAN) NASAL SPRAY 4 MG/0.1 ML    Spray into one nostril. Repeat with second device into other nostril after 2-3 minutes if no or minimal response. Use in case of opioid overdose.   Medications administered today: Gilford Rile. Kiener had no medications administered during this visit. Lab-work, procedure(s), and/or referral(s): No orders of the defined types were placed in this encounter.  Imaging and/or referral(s): None  Interventional therapies: Planned, scheduled, and/or pending: Not at this time.   Considering:  Diagnostic Right-sided cervical epiduralsteroid injection.  Diagnostic Right-sided L4-5 lumbar epiduralsteroid injection.  Diagnostic bilateral lumbar facetblock. Possible bilateral lumbar facet radiofrequencyablation. Diagnostic bilateral cervical facetblock.  Possible bilateral cervical facet radiofrequencyablation.   Palliative PRN treatment(s):  For the arm pain and numbness, right-sided cervical epidural steroid injection For the lower extremity pain, right-sided L4-5 lumbar epidural steroid injection For the low back pain, diagnostic bilateral lumbar facet block For the neck  pain, diagnostic bilateral cervical facet block     Provider-requested follow-up: Return in about 3 months (around 05/21/2018) for MedMgmt with Me Dionisio David).  Future Appointments  Date Time Provider Axis  05/21/2018  8:30 AM James Francois, NP [facility] None   Primary Care Physician: James Pitch, MD Location: [facility] Outpatient Pain Management Facility Note by: James Francois NP Date: 02/19/2018; Time: 10:18 AM  Pain Score Disclaimer: We use the NRS-11 scale. This is a self-reported, subjective measurement of pain severity with only modest accuracy. It is used primarily to identify changes within a particular patient. It must be understood that outpatient pain scales are significantly less accurate that those used for research, where they can be applied under ideal controlled circumstances with minimal exposure to variables. In reality, the score is likely to be a combination of pain intensity and pain affect, where pain affect describes the degree of emotional arousal or changes in action readiness caused by the sensory experience of pain. Factors such as social and work situation, setting, emotional state, anxiety levels, expectation, and prior pain experience may influence pain perception and show large inter-individual differences that may also be affected by time variables.  Patient instructions provided during this appointment: Patient Instructions   ____________________________________________________________________________________________  Medication Rules  Applies to: All patients receiving prescriptions (written or electronic).  Pharmacy of record: Pharmacy where electronic prescriptions will be sent. If written  prescriptions are taken to a different pharmacy, please inform the nursing staff. The pharmacy listed in the electronic medical record should be the one where you would like electronic prescriptions to be sent.  Prescription refills: Only during  scheduled appointments. Applies to both, written and electronic prescriptions.  NOTE: The following applies primarily to controlled substances (Opioid* Pain Medications).   Patient's responsibilities: 1. Pain Pills: Bring all pain pills to every appointment (except for procedure appointments). 2. Pill Bottles: Bring pills in original pharmacy bottle. Always bring newest bottle. Bring bottle, even if empty. 3. Medication refills: You are responsible for knowing and keeping track of what medications you need refilled. The day before your appointment, write a list of all prescriptions that need to be refilled. Bring that list to your appointment and give it to the admitting nurse. Prescriptions will be written only during appointments. If you forget a medication, it will not be "Called in", "Faxed", or "electronically sent". You will need to get another appointment to get these prescribed. 4. Prescription Accuracy: You are responsible for carefully inspecting your prescriptions before leaving our office. Have the discharge nurse carefully go over each prescription with you, before taking them home. Make sure that your name is accurately spelled, that your address is correct. Check the name and dose of your medication to make sure it is accurate. Check the number of pills, and the written instructions to make sure they are clear and accurate. Make sure that you are given enough medication to last until your next medication refill appointment. 5. Taking Medication: Take medication as prescribed. Never take more pills than instructed. Never take medication more frequently than prescribed. Taking less pills or less frequently is permitted and encouraged, when it comes to controlled substances (written prescriptions).  6. Inform other Doctors: Always inform, all of your healthcare providers, of all the medications you take. 7. Pain Medication from other Providers: You are not allowed to accept any additional pain  medication from any other Doctor or Healthcare provider. There are two exceptions to this rule. (see below) In the event that you require additional pain medication, you are responsible for notifying us, as stated below. 8. Medication Agreement: You are responsible for carefully reading and following our Medication Agreement. This must be signed before receiving any prescriptions from our practice. Safely store a copy of your signed Agreement. Violations to the Agreement will result in no further prescriptions. (Additional copies of our Medication Agreement are available upon request.) 9. Laws, Rules, & Regulations: All patients are expected to follow all Federal and [employer], [employer], Rules, [employer]. Ignorance of the Laws does not constitute a valid excuse. The use of any illegal substances is prohibited. 10. Adopted CDC guidelines & recommendations: Target dosing levels will be at or below 60 MME/day. Use of benzodiazepines** is not recommended.  Exceptions: There are only two exceptions to the rule of not receiving pain medications from other Healthcare Providers. 1. Exception #1 (Emergencies): In the event of an emergency (i.e.: accident requiring emergency care), you are allowed to receive additional pain medication. However, you are responsible for: As soon as you are able, call our office [phone], at any time of the day or night, and leave a message stating your name, the date and nature of the emergency, and the name and dose of the medication prescribed. In the event that your call is answered by a member of our staff, make sure to document and save the date, time, and the name  of the person that took your information.  2. Exception #2 (Planned Surgery): In the event that you are scheduled by another doctor or dentist to have any type of surgery or procedure, you are allowed (for a period no longer than 30 days), to receive additional pain medication, for the acute post-op pain.  However, in this case, you are responsible for picking up a copy of our "Post-op Pain Management for Surgeons" handout, and giving it to your surgeon or dentist. This document is available at our office, and does not require an appointment to obtain it. Simply go to our office during business hours (Monday-Thursday from 8:00 AM to 4:00 PM) (Friday 8:00 AM to 12:00 Noon) or if you have a scheduled appointment with [place], prior to your surgery, and ask for it by name. In addition, you will need to provide [place] with your name, name of your surgeon, type of surgery, and date of procedure or surgery.  *Opioid medications include: morphine, codeine, oxycodone, oxymorphone, hydrocodone, hydromorphone, meperidine, tramadol, tapentadol, buprenorphine, fentanyl, methadone. **Benzodiazepine medications include: diazepam (Valium), alprazolam (Xanax), clonazepam (Klonopine), lorazepam (Ativan), clorazepate (Tranxene), chlordiazepoxide (Librium), estazolam (Prosom), oxazepam (Serax), temazepam (Restoril), triazolam (Halcion) (Last updated: 01/02/2018) ____________________________________________________________________________________________   BMI Assessment: Estimated body mass index is 36.62 kg/m as calculated from the following:   Height as of this encounter: 6' (1.829 m).   Weight as of this encounter: 270 lb (122.5 kg).  BMI interpretation table: BMI level Category Range association with higher incidence of chronic pain  <18 kg/m2 Underweight   18.5-24.9 kg/m2 Ideal body weight   25-29.9 kg/m2 Overweight Increased incidence by 20%  30-34.9 kg/m2 Obese (Class I) Increased incidence by 68%  35-39.9 kg/m2 Severe obesity (Class II) Increased incidence by 136%  >40 kg/m2 Extreme obesity (Class III) Increased incidence by 254%   BMI Readings from Last 4 Encounters:  02/19/18 36.62 kg/m  02/12/18 37.35 kg/m  01/13/18 37.66 kg/m  11/27/17 34.44 kg/m   Wt Readings from Last 4 Encounters:  02/19/18 270 lb  (122.5 kg)  02/12/18 267 lb 12.8 oz (121.5 kg)  01/13/18 270 lb (122.5 kg)  11/27/17 240 lb (108.9 kg)

## 2018-02-19 NOTE — Progress Notes (Signed)
Nursing Pain Medication Assessment:  Safety precautions to be maintained throughout the outpatient stay will include: orient to surroundings, keep bed in low position, maintain call bell within reach at all times, provide assistance with transfer out of bed and ambulation.  Medication Inspection Compliance: Pill count conducted under aseptic conditions, in front of the patient. Neither the pills nor the bottle was removed from the patient's sight at any time. Once count was completed pills were immediately returned to the patient in their original bottle.  Medication #1: Fentanyl patch Pill/Patch Count: 5 of 5 pills remain Pill/Patch Appearance: Markings consistent with prescribed medication Bottle Appearance: Standard pharmacy container. Clearly labeled. Filled Date: 4 / 4 / 2019 Last Medication intake:  Yesterday  Medication #2: Oxycodone IR Pill/Patch Count: 50 of 90 pills remain Pill/Patch Appearance: Markings consistent with prescribed medication Bottle Appearance: Standard pharmacy container. Clearly labeled. Filled Date: 04 / 04 / 2019 Last Medication intake:  Today

## 2018-02-24 DIAGNOSIS — G4733 Obstructive sleep apnea (adult) (pediatric): Secondary | ICD-10-CM | POA: Diagnosis not present

## 2018-02-24 DIAGNOSIS — L930 Discoid lupus erythematosus: Secondary | ICD-10-CM | POA: Diagnosis not present

## 2018-02-24 DIAGNOSIS — J986 Disorders of diaphragm: Secondary | ICD-10-CM | POA: Diagnosis not present

## 2018-02-24 DIAGNOSIS — R0609 Other forms of dyspnea: Secondary | ICD-10-CM | POA: Diagnosis not present

## 2018-03-04 ENCOUNTER — Encounter: Admission: RE | Payer: Self-pay | Source: Ambulatory Visit

## 2018-03-04 ENCOUNTER — Ambulatory Visit: Admission: RE | Admit: 2018-03-04 | Payer: PPO | Source: Ambulatory Visit | Admitting: Internal Medicine

## 2018-03-04 DIAGNOSIS — M1990 Unspecified osteoarthritis, unspecified site: Secondary | ICD-10-CM | POA: Diagnosis not present

## 2018-03-04 DIAGNOSIS — Z79899 Other long term (current) drug therapy: Secondary | ICD-10-CM | POA: Diagnosis not present

## 2018-03-04 DIAGNOSIS — L409 Psoriasis, unspecified: Secondary | ICD-10-CM | POA: Diagnosis not present

## 2018-03-04 SURGERY — ESOPHAGOGASTRODUODENOSCOPY (EGD) WITH PROPOFOL
Anesthesia: General

## 2018-03-05 DIAGNOSIS — I480 Paroxysmal atrial fibrillation: Secondary | ICD-10-CM | POA: Diagnosis not present

## 2018-03-05 DIAGNOSIS — G4730 Sleep apnea, unspecified: Secondary | ICD-10-CM | POA: Diagnosis not present

## 2018-03-05 DIAGNOSIS — R931 Abnormal findings on diagnostic imaging of heart and coronary circulation: Secondary | ICD-10-CM | POA: Diagnosis not present

## 2018-03-05 DIAGNOSIS — I471 Supraventricular tachycardia: Secondary | ICD-10-CM | POA: Diagnosis not present

## 2018-03-05 DIAGNOSIS — I491 Atrial premature depolarization: Secondary | ICD-10-CM | POA: Diagnosis not present

## 2018-03-05 DIAGNOSIS — I10 Essential (primary) hypertension: Secondary | ICD-10-CM | POA: Diagnosis not present

## 2018-03-05 DIAGNOSIS — R079 Chest pain, unspecified: Secondary | ICD-10-CM | POA: Diagnosis not present

## 2018-03-11 DIAGNOSIS — L940 Localized scleroderma [morphea]: Secondary | ICD-10-CM | POA: Diagnosis not present

## 2018-03-11 DIAGNOSIS — L409 Psoriasis, unspecified: Secondary | ICD-10-CM | POA: Diagnosis not present

## 2018-03-11 DIAGNOSIS — M150 Primary generalized (osteo)arthritis: Secondary | ICD-10-CM | POA: Diagnosis not present

## 2018-03-11 DIAGNOSIS — M1990 Unspecified osteoarthritis, unspecified site: Secondary | ICD-10-CM | POA: Diagnosis not present

## 2018-03-11 DIAGNOSIS — Z79899 Other long term (current) drug therapy: Secondary | ICD-10-CM | POA: Diagnosis not present

## 2018-03-12 DIAGNOSIS — R079 Chest pain, unspecified: Secondary | ICD-10-CM | POA: Diagnosis not present

## 2018-03-17 DIAGNOSIS — I491 Atrial premature depolarization: Secondary | ICD-10-CM | POA: Diagnosis not present

## 2018-03-17 DIAGNOSIS — I471 Supraventricular tachycardia: Secondary | ICD-10-CM | POA: Diagnosis not present

## 2018-03-17 DIAGNOSIS — I480 Paroxysmal atrial fibrillation: Secondary | ICD-10-CM | POA: Diagnosis not present

## 2018-03-17 DIAGNOSIS — I10 Essential (primary) hypertension: Secondary | ICD-10-CM | POA: Diagnosis not present

## 2018-03-17 DIAGNOSIS — R002 Palpitations: Secondary | ICD-10-CM | POA: Diagnosis not present

## 2018-03-17 DIAGNOSIS — E119 Type 2 diabetes mellitus without complications: Secondary | ICD-10-CM | POA: Diagnosis not present

## 2018-03-17 DIAGNOSIS — G4730 Sleep apnea, unspecified: Secondary | ICD-10-CM | POA: Diagnosis not present

## 2018-03-17 DIAGNOSIS — R079 Chest pain, unspecified: Secondary | ICD-10-CM | POA: Diagnosis not present

## 2018-03-19 DIAGNOSIS — R5383 Other fatigue: Secondary | ICD-10-CM | POA: Diagnosis not present

## 2018-03-19 DIAGNOSIS — G4730 Sleep apnea, unspecified: Secondary | ICD-10-CM | POA: Diagnosis not present

## 2018-03-19 DIAGNOSIS — G8929 Other chronic pain: Secondary | ICD-10-CM | POA: Diagnosis not present

## 2018-03-20 ENCOUNTER — Other Ambulatory Visit: Payer: Self-pay

## 2018-03-25 ENCOUNTER — Other Ambulatory Visit: Payer: Self-pay

## 2018-03-25 NOTE — Patient Outreach (Signed)
[facility]) Care Management  03/25/2018   James [name] January 15, 1956 [id number]   Telephone call to assess member for diabetes chronic care improvement program.   Referral received  03/20/18  from insurance plan outreach  Subjective: Member states that he does not know why he has been labeled with diabetes.  States it was done years ago and he does not remember how or when it was added.  States he has been on steroids over the years.  States his labs have been good and the only time his sugar was up was when his blood drawn right after he drank a sweet coffee drink.  States has lots of other health problems.  States he has chronic pain in his back that he is seen at the pain clinic.  States he has SOB and irregular heart beats that cause him problems.  States he is not on a blood thinner but his cardiologist would like him to start one.  States he has fatigue that he saw his doctor for last week.  States he has breathing problems also.  States his wife helps him if he needs assistance.  States he keeps up with his medications and he can afford his medication at this time.  States he does not know much about A.Fib and why he should take a blood thinner.  States he can feel when his heart is out of rhythm and he feels bad when it is out.   States he has sleep apnea and he wears his CPAP at night.  States he uses a cane to walk and sometimes has difficulty getting around due to his pain and SOB.  Objective:  per chart review and [employer]) member has hx of A.Fib, SVT, arthritis, chronic pain, degenerative disc disease, systemic lupus erythematosus, HTN, COPD, GERD, psoriasis, sleep apnea, and type 2 DM  Current Medications:  Current Outpatient Medications  Medication Sig Dispense Refill  . albuterol (PROAIR HFA) 108 (90 BASE) MCG/ACT inhaler Inhale 2 puffs into the lungs every 4 (four) hours as needed for wheezing or shortness of breath.     Marland Kitchen aspirin EC 81 MG  tablet Take 81 mg by mouth daily.    Marland Kitchen azelastine (ASTELIN) 0.1 % nasal spray Place 1 spray into both nostrils 2 (two) times daily.     Marland Kitchen b complex vitamins tablet Take 1 tablet by mouth 2 (two) times daily.    . budesonide-formoterol (SYMBICORT) 80-4.5 MCG/ACT inhaler Inhale 2 puffs into the lungs 2 (two) times daily.    . cholecalciferol (VITAMIN D) 1000 units tablet Take 5,000 Units by mouth daily.     Derrill Memo ON 05/07/2018] fentaNYL (DURAGESIC - DOSED MCG/HR) 12 MCG/HR Place 1 patch (12.5 mcg total) onto the skin every 3 (three) days. 10 patch 0  . [START ON 04/07/2018] fentaNYL (DURAGESIC - DOSED MCG/HR) 12 MCG/HR Place 1 patch (12.5 mcg total) onto the skin every 3 (three) days. 10 patch 0  . fentaNYL (DURAGESIC - DOSED MCG/HR) 12 MCG/HR Place 1 patch (12.5 mcg total) onto the skin every 3 (three) days. 10 patch 0  . folic acid (FOLVITE) 1 MG tablet Take 2 mg by mouth 2 (two) times daily.     Marland Kitchen loratadine (CLARITIN) 10 MG tablet Take 10 mg by mouth daily.    . methotrexate 50 MG/2ML injection Inject 50 mg/m2 as directed once a week.     . metoprolol (LOPRESSOR) 100 MG tablet TAKE 1 TABLET BY MOUTH TWICE A  DAY    . naloxone (NARCAN) nasal spray 4 mg/0.1 mL Spray into one nostril. Repeat with second device into other nostril after 2-3 minutes if no or minimal response. Use in case of opioid overdose. 1 kit 0  . [START ON 04/07/2018] Oxycodone HCl 20 MG TABS Take 1 tablet (20 mg total) by mouth every 8 (eight) hours. 90 tablet 0  . Oxycodone HCl 20 MG TABS Take 1 tablet (20 mg total) by mouth every 8 (eight) hours as needed. 90 tablet 0  . [START ON 05/07/2018] Oxycodone HCl 20 MG TABS Take 1 tablet (20 mg total) by mouth every 8 (eight) hours as needed. 90 tablet 0  . promethazine (PHENERGAN) 12.5 MG tablet Take by mouth every 4 (four) hours as needed.     . sucralfate (CARAFATE) 1 G tablet Take 1 g by mouth 4 (four) times daily.     Marland Kitchen tiZANidine (ZANAFLEX) 4 MG capsule Take 4 mg by mouth 3 (three)  times daily as needed for muscle spasms.    . B Complex Vitamins (VITAMIN B COMPLEX 100 IJ) Inject as directed.    . pantoprazole (PROTONIX) 20 MG tablet Take 20 mg by mouth daily.      No current facility-administered medications for this visit.     Functional Status:  In your present state of health, do you have any difficulty performing the following activities: 03/25/2018  Hearing? N  Vision? Y  Difficulty concentrating or making decisions? N  Walking or climbing stairs? N  Dressing or bathing? N  Doing errands, shopping? Y  Preparing Food and eating ? N  Using the Toilet? Y  In the past six months, have you accidently leaked urine? Y  Do you have problems with loss of bowel control? N  Managing your Medications? N  Managing your Finances? N  Housekeeping or managing your Housekeeping? N  Some recent data might be hidden    Fall/Depression Screening: Fall Risk  03/25/2018 02/19/2018 11/27/2017  Falls in the past year? Yes Yes No  Number falls in past yr: 2 or more - -  Comment - - -  Injury with Fall? Yes No -  Comment bruising - -  Risk Factor Category  - - -  Risk for fall due to : History of fall(s);Impaired balance/gait History of fall(s) -  Risk for fall due to: Comment - - -  Follow up Falls prevention discussed - -   PHQ 2/9 Scores 03/25/2018 02/19/2018 11/27/2017 08/29/2017 07/18/2017 05/30/2017 05/02/2017  PHQ - 2 Score 0 0 0 0 0 0 0  Exception Documentation - - - - - - -    Assessment: Initial assessment for diabetes chronic care improvement program.  Member's last hemoglobin A1C 5.2% on 12/12/17 and he is not on any diabetes medications.  Member reports poor knowledge of A.Fib and his risk for stroke. Member not on an anticoagulant. Member agreeable to being in health coach program for A.Fib not diabetes.  Member with hx of numerous health issues and is seen by several specialist.  Member to see cardiologist on 04/28/18.  Member has hx of chronic pain and is followed by  pain clinic. Medication list reviewed  Plan:  [facility] CM Care Plan Problem One     Most Recent Value  Care Plan Problem One  Knowledge deficit related to dx of atrial fibrillation  Role Documenting the Problem One  Care Management Coordinator  Care Plan for Problem One  Active  [facility] Long Term Goal  Member will verbalize improved knowledge of A.Fib  as evidenced verbal recall in the next 90 days  [facility] Long Term Goal Start Date  03/25/18  Interventions for Problem One Long Term Goal  Instructed on health coaching program for A.Fib.  Reviewed benefits and program requirements, Instructed that A.Fib education book will be mailed to him, Instructed on disease process of A.Fib, Instructed to discuss his action plan to treat episodes of A.Fib with his cardiologist at his next appt, Instructed on s/s to call MD and when to go to ED, Given number for the 24 hr nurse line and instructed he will receive a magnet with the number  [facility] CM Short Term Goal #1   Member will be able to verbalize s/s of stroke in the next 30 days  [facility] CM Short Term Goal #1 Start Date  03/25/18  Interventions for Short Term Goal #1  Instructed on s/s of stroke, Instructed on F.A.S.T.  and instructed that it is important for his family to know these signs   [facility] CM Short Term Goal #2   Member will discuss use of anticoagulant medication at his cardiology visit on 04/28/18  [facility] CM Short Term Goal #2 Start Date  03/25/18  Interventions for Short Term Goal #2  Instructed on why it is important to be on an anticoagulation medication if he has A.Fib and his risk score is higher, Instructed to discuss his options for anticoagulation medications with his cardiologist at his 04/28/18 visit     Plan to enroll in health coach program for A.Fib Plan to for telephone follow up in one month Plan to mail successful outreach letter,  Welcome packet, A.Fib education book, Emmi programs, [employer] officer, trade union, Designer, multimedia to send  barriers letter to MD   Peter Garter RN, [place] Management Coordinator [facility] Care Management [phone]

## 2018-04-30 ENCOUNTER — Other Ambulatory Visit: Payer: Self-pay

## 2018-04-30 NOTE — Patient Outreach (Signed)
[facility]) Care Management  04/30/2018   James [name] [birth year] [id number]  Scheduled telephone visit for disease management program for A.Fib Referral received  03/20/18  from insurance plan outreach  Subjective: Member states that he had to reschedule his cardiology appt and he is now going to see him on 05/16/18.  States that he has had 2 episodes of his heart racing this last month.  States he took his extra metoprolol and that helped it go away.  States he plans to call his primary care provider to schedule an appt soon to have him check some of his rashes and fatique.  States his chronic pain is unchanged at a 7 and he is having some joint aches from his arthritis.  States he is able to walk to the mailbox most days.    Objective: per chart review and [employer]) member has hx of A.Fib, SVT, arthritis, chronic pain, degenerative disc disease, systemic lupus erythematosus, HTN, COPD, GERD, psoriasis, sleep apnea, and type    Current Medications:  Current Outpatient Medications  Medication Sig Dispense Refill  . albuterol (PROAIR HFA) 108 (90 BASE) MCG/ACT inhaler Inhale 2 puffs into the lungs every 4 (four) hours as needed for wheezing or shortness of breath.     Marland Kitchen aspirin EC 81 MG tablet Take 81 mg by mouth daily.    Marland Kitchen azelastine (ASTELIN) 0.1 % nasal spray Place 1 spray into both nostrils 2 (two) times daily.     Marland Kitchen b complex vitamins tablet Take 1 tablet by mouth 2 (two) times daily.    . budesonide-formoterol (SYMBICORT) 80-4.5 MCG/ACT inhaler Inhale 2 puffs into the lungs 2 (two) times daily.    . cholecalciferol (VITAMIN D) 1000 units tablet Take 5,000 Units by mouth daily.     . fentaNYL (DURAGESIC - DOSED MCG/HR) 12 MCG/HR Place 1 patch (12.5 mcg total) onto the skin every 3 (three) days. 10 patch 0  . folic acid (FOLVITE) 1 MG tablet Take 2 mg by mouth 2 (two) times daily.     Marland Kitchen loratadine (CLARITIN) 10 MG tablet Take 10 mg by mouth  daily.    . methotrexate 50 MG/2ML injection Inject 50 mg/m2 as directed once a week.     . metoprolol (LOPRESSOR) 100 MG tablet TAKE 1 TABLET BY MOUTH TWICE A DAY    . naloxone (NARCAN) nasal spray 4 mg/0.1 mL Spray into one nostril. Repeat with second device into other nostril after 2-3 minutes if no or minimal response. Use in case of opioid overdose. 1 kit 0  . Oxycodone HCl 20 MG TABS Take 1 tablet (20 mg total) by mouth every 8 (eight) hours. 90 tablet 0  . pantoprazole (PROTONIX) 20 MG tablet Take 20 mg by mouth daily.     . promethazine (PHENERGAN) 12.5 MG tablet Take by mouth every 4 (four) hours as needed.     . sucralfate (CARAFATE) 1 G tablet Take 1 g by mouth 4 (four) times daily.     Marland Kitchen tiZANidine (ZANAFLEX) 4 MG capsule Take 4 mg by mouth 3 (three) times daily as needed for muscle spasms.    . B Complex Vitamins (VITAMIN B COMPLEX 100 IJ) Inject as directed.    Derrill Memo ON 05/07/2018] fentaNYL (DURAGESIC - DOSED MCG/HR) 12 MCG/HR Place 1 patch (12.5 mcg total) onto the skin every 3 (three) days. 10 patch 0  . fentaNYL (DURAGESIC - DOSED MCG/HR) 12 MCG/HR Place 1 patch (12.5 mcg total) onto the  skin every 3 (three) days. 10 patch 0  . Oxycodone HCl 20 MG TABS Take 1 tablet (20 mg total) by mouth every 8 (eight) hours as needed. 90 tablet 0  . [START ON 05/07/2018] Oxycodone HCl 20 MG TABS Take 1 tablet (20 mg total) by mouth every 8 (eight) hours as needed. 90 tablet 0   No current facility-administered medications for this visit.     Functional Status:  In your present state of health, do you have any difficulty performing the following activities: 03/25/2018  Hearing? N  Vision? Y  Difficulty concentrating or making decisions? N  Walking or climbing stairs? Y  Dressing or bathing? N  Doing errands, shopping? Y  Preparing Food and eating ? N  Using the Toilet? Y  In the past six months, have you accidently leaked urine? Y  Do you have problems with loss of bowel control? N   Managing your Medications? N  Managing your Finances? N  Housekeeping or managing your Housekeeping? N  Some recent data might be hidden    Fall/Depression Screening: Fall Risk  04/30/2018 03/25/2018 02/19/2018  Falls in the past year? Yes Yes Yes  Comment None this month - -  Number falls in past yr: 2 or more 2 or more -  Comment - - -  Injury with Fall? - Yes No  Comment - bruising -  Risk Factor Category  - - -  Risk for fall due to : - History of fall(s);Impaired balance/gait History of fall(s)  Risk for fall due to: Comment - - -  Follow up - Falls prevention discussed -   PHQ 2/9 Scores 04/30/2018 03/25/2018 02/19/2018 11/27/2017 08/29/2017 07/18/2017 05/30/2017  PHQ - 2 Score 0 0 0 0 0 0 0  Exception Documentation - - - - - - -    Assessment:  Scheduled telephone visit for disease management program for A.Fib    Member reported poor knowledge of A.Fib and his risk for stroke. Member not on an anticoagulant. Member with hx of numerous health issues and is seen by several specialist.  Member rescheduled  cardiologist on 05/16/18. Reported 2 episodes of fast heart beat which were relieved with PRN Metoprolol.   Member has hx of chronic pain and is followed by pain clinic. Member past due for eye exam but does not wish to make goal to schedule appt yet.   Plan:  [facility] CM Care Plan Problem One     Most Recent Value  Care Plan Problem One  Knowledge deficit related to dx of atrial fibrillation  Role Documenting the Problem One  Care Management Coordinator  Care Plan for Problem One  Active  [facility] Long Term Goal   Member will verbalize improved knowledge of A.Fib  as evidenced verbal recall in the next 90 days  [facility] Long Term Goal Start Date  03/25/18  Interventions for Problem One Long Term Goal  Reviewed s/s fo A.Fib and actions to take,  Instructed on his metoprolol and how it helps lower heart rate, Encouraged to discuss with cardiologist his treatment options for his A.Fib,  Instructed on  the risks of stroke and for heart failure  [facility] CM Short Term Goal #1   Member will be able to verbalize s/s of stroke in the next 30 days  [facility] CM Short Term Goal #1 Start Date  03/25/18  [facility] CM Short Term Goal #1 Met Date  04/30/18  Interventions for Short Term Goal #1  Reviewed s/s of stroke  and F.A.S.T.    [facility] CM Short Term Goal #2   Member will discuss use of anticoagulant medication at his cardiology visit on 04/28/18  [facility] CM Short Term Goal #2 Start Date  04/30/18 [facility] cardiology appt rescheduled 05/16/18]  Interventions for Short Term Goal #2  Reinforced to discuss with his cardiologist on his appt on 05/16/18 about use of anticoagulation medications  [facility] CM Short Term Goal #3  Member will walk to the mailbox twice a day 5 days a week for the next 30 days  [facility] CM Short Term Goal #3 Start Date  04/30/18  Interventions for Short Tern Goal #3  Instructed on importance of exercise for his overall health       Plan to send EMMI printed materials on A.Fib Plan follow up telephone visit in one month [place], [facility], [place] Management Coordinator [facility] Care Management [phone]

## 2018-05-21 ENCOUNTER — Encounter: Payer: Self-pay | Admitting: Nurse Practitioner

## 2018-05-21 ENCOUNTER — Other Ambulatory Visit: Payer: Self-pay

## 2018-05-21 ENCOUNTER — Ambulatory Visit: Payer: PPO | Attending: Nurse Practitioner | Admitting: Nurse Practitioner

## 2018-05-21 VITALS — BP 121/72 | HR 70 | Temp 97.6°F | Resp 18 | Ht 72.0 in | Wt 260.0 lb

## 2018-05-21 DIAGNOSIS — M79604 Pain in right leg: Secondary | ICD-10-CM | POA: Diagnosis not present

## 2018-05-21 DIAGNOSIS — G5641 Causalgia of right upper limb: Secondary | ICD-10-CM | POA: Diagnosis not present

## 2018-05-21 DIAGNOSIS — M25552 Pain in left hip: Secondary | ICD-10-CM | POA: Diagnosis not present

## 2018-05-21 DIAGNOSIS — M25551 Pain in right hip: Secondary | ICD-10-CM | POA: Diagnosis not present

## 2018-05-21 DIAGNOSIS — M79605 Pain in left leg: Secondary | ICD-10-CM | POA: Diagnosis not present

## 2018-05-21 DIAGNOSIS — Z79899 Other long term (current) drug therapy: Secondary | ICD-10-CM | POA: Diagnosis not present

## 2018-05-21 DIAGNOSIS — M533 Sacrococcygeal disorders, not elsewhere classified: Secondary | ICD-10-CM | POA: Diagnosis not present

## 2018-05-21 DIAGNOSIS — M47816 Spondylosis without myelopathy or radiculopathy, lumbar region: Secondary | ICD-10-CM | POA: Diagnosis not present

## 2018-05-21 DIAGNOSIS — G894 Chronic pain syndrome: Secondary | ICD-10-CM | POA: Diagnosis not present

## 2018-05-21 DIAGNOSIS — I10 Essential (primary) hypertension: Secondary | ICD-10-CM | POA: Diagnosis not present

## 2018-05-21 DIAGNOSIS — Z880 Allergy status to penicillin: Secondary | ICD-10-CM | POA: Insufficient documentation

## 2018-05-21 DIAGNOSIS — E079 Disorder of thyroid, unspecified: Secondary | ICD-10-CM | POA: Diagnosis not present

## 2018-05-21 DIAGNOSIS — Z5181 Encounter for therapeutic drug level monitoring: Secondary | ICD-10-CM | POA: Insufficient documentation

## 2018-05-21 DIAGNOSIS — M545 Low back pain: Secondary | ICD-10-CM | POA: Diagnosis not present

## 2018-05-21 DIAGNOSIS — G8929 Other chronic pain: Secondary | ICD-10-CM

## 2018-05-21 DIAGNOSIS — Z87891 Personal history of nicotine dependence: Secondary | ICD-10-CM | POA: Diagnosis not present

## 2018-05-21 DIAGNOSIS — Z79891 Long term (current) use of opiate analgesic: Secondary | ICD-10-CM

## 2018-05-21 DIAGNOSIS — Z9889 Other specified postprocedural states: Secondary | ICD-10-CM | POA: Insufficient documentation

## 2018-05-21 DIAGNOSIS — J45909 Unspecified asthma, uncomplicated: Secondary | ICD-10-CM | POA: Insufficient documentation

## 2018-05-21 MED ORDER — OXYCODONE HCL 20 MG PO TABS: 20.0000 mg | ORAL_TABLET | Freq: Three times a day (TID) | ORAL | 0 refills | Status: DC

## 2018-05-21 MED ORDER — FENTANYL 12 MCG/HR TD PT72: 12.5000 ug | MEDICATED_PATCH | TRANSDERMAL | 0 refills | Status: DC

## 2018-05-21 MED ORDER — OXYCODONE HCL 20 MG PO TABS: 20.0000 mg | ORAL_TABLET | Freq: Three times a day (TID) | ORAL | 0 refills | Status: DC | PRN

## 2018-05-21 MED ORDER — FENTANYL 12 MCG/HR TD PT72: 12.0000 ug | MEDICATED_PATCH | TRANSDERMAL | 0 refills | Status: DC

## 2018-05-21 NOTE — Patient Instructions (Addendum)
____________________________________________________________________________________________  Medication Rules  Applies to: All patients receiving prescriptions (written or electronic).  Pharmacy of record: Pharmacy where electronic prescriptions will be sent. If written prescriptions are taken to a different pharmacy, please inform the nursing staff. The pharmacy listed in the electronic medical record should be the one where you would like electronic prescriptions to be sent.  Prescription refills: Only during scheduled appointments. Applies to both, written and electronic prescriptions.  NOTE: The following applies primarily to controlled substances (Opioid* Pain Medications).   Patient's responsibilities: 1. Pain Pills: Bring all pain pills to every appointment (except for procedure appointments). 2. Pill Bottles: Bring pills in original pharmacy bottle. Always bring newest bottle. Bring bottle, even if empty. 3. Medication refills: You are responsible for knowing and keeping track of what medications you need refilled. The day before your appointment, write a list of all prescriptions that need to be refilled. Bring that list to your appointment and give it to the admitting nurse. Prescriptions will be written only during appointments. If you forget a medication, it will not be "Called in", "Faxed", or "electronically sent". You will need to get another appointment to get these prescribed. 4. Prescription Accuracy: You are responsible for carefully inspecting your prescriptions before leaving our office. Have the discharge nurse carefully go over each prescription with you, before taking them home. Make sure that your name is accurately spelled, that your address is correct. Check the name and dose of your medication to make sure it is accurate. Check the number of pills, and the written instructions to make sure they are clear and accurate. Make sure that you are given enough medication to last  until your next medication refill appointment. 5. Taking Medication: Take medication as prescribed. Never take more pills than instructed. Never take medication more frequently than prescribed. Taking less pills or less frequently is permitted and encouraged, when it comes to controlled substances (written prescriptions).  6. Inform other Doctors: Always inform, all of your healthcare providers, of all the medications you take. 7. Pain Medication from other Providers: You are not allowed to accept any additional pain medication from any other Doctor or Healthcare provider. There are two exceptions to this rule. (see below) In the event that you require additional pain medication, you are responsible for notifying us, as stated below. 8. Medication Agreement: You are responsible for carefully reading and following our Medication Agreement. This must be signed before receiving any prescriptions from our practice. Safely store a copy of your signed Agreement. Violations to the Agreement will result in no further prescriptions. (Additional copies of our Medication Agreement are available upon request.) 9. Laws, Rules, & Regulations: All patients are expected to follow all Federal and State Laws, Statutes, Rules, & Regulations. Ignorance of the Laws does not constitute a valid excuse. The use of any illegal substances is prohibited. 10. Adopted CDC guidelines & recommendations: Target dosing levels will be at or below 60 MME/day. Use of benzodiazepines** is not recommended.  Exceptions: There are only two exceptions to the rule of not receiving pain medications from other Healthcare Providers. 1. Exception #1 (Emergencies): In the event of an emergency (i.e.: accident requiring emergency care), you are allowed to receive additional pain medication. However, you are responsible for: As soon as you are able, call our office [phone], at any time of the day or night, and leave a message stating your name, the  date and nature of the emergency, and the name and dose of the medication   prescribed. In the event that your call is answered by a member of our staff, make sure to document and save the date, time, and the name of the person that took your information.  2. Exception #2 (Planned Surgery): In the event that you are scheduled by another doctor or dentist to have any type of surgery or procedure, you are allowed (for a period no longer than 30 days), to receive additional pain medication, for the acute post-op pain. However, in this case, you are responsible for picking up a copy of our "Post-op Pain Management for Surgeons" handout, and giving it to your surgeon or dentist. This document is available at our office, and does not require an appointment to obtain it. Simply go to our office during business hours (Monday-Thursday from 8:00 AM to 4:00 PM) (Friday 8:00 AM to 12:00 Noon) or if you have a scheduled appointment with [place], prior to your surgery, and ask for it by name. In addition, you will need to provide [place] with your name, name of your surgeon, type of surgery, and date of procedure or surgery.  *Opioid medications include: morphine, codeine, oxycodone, oxymorphone, hydrocodone, hydromorphone, meperidine, tramadol, tapentadol, buprenorphine, fentanyl, methadone. **Benzodiazepine medications include: diazepam (Valium), alprazolam (Xanax), clonazepam (Klonopine), lorazepam (Ativan), clorazepate (Tranxene), chlordiazepoxide (Librium), estazolam (Prosom), oxazepam (Serax), temazepam (Restoril), triazolam (Halcion) (Last updated: 01/02/2018) ____________________________________________________________________________________________   BMI Assessment: Estimated body mass index is 35.26 kg/m as calculated from the following:   Height as of this encounter: 6' (1.829 m).   Weight as of this encounter: 260 lb (117.9 kg).  BMI interpretation table: BMI level Category Range association with higher incidence  of chronic pain  <18 kg/m2 Underweight   18.5-24.9 kg/m2 Ideal body weight   25-29.9 kg/m2 Overweight Increased incidence by 20%  30-34.9 kg/m2 Obese (Class I) Increased incidence by 68%  35-39.9 kg/m2 Severe obesity (Class II) Increased incidence by 136%  >40 kg/m2 Extreme obesity (Class III) Increased incidence by 254%   Patient's current BMI Ideal Body weight  Body mass index is 35.26 kg/m. Ideal body weight: 77.6 kg (171 lb 1.2 oz) Adjusted ideal body weight: 93.7 kg (206 lb 10.3 oz)   BMI Readings from Last 4 Encounters:  05/21/18 35.26 kg/m  02/19/18 36.62 kg/m  02/12/18 37.35 kg/m  01/13/18 37.66 kg/m   Wt Readings from Last 4 Encounters:  05/21/18 260 lb (117.9 kg)  02/19/18 270 lb (122.5 kg)  02/12/18 267 lb 12.8 oz (121.5 kg)  01/13/18 270 lb (122.5 kg)   Duragesic and oxycodone prescription given x3.

## 2018-05-21 NOTE — Progress Notes (Signed)
Patient's Name: James [name]  MRN: [id number]  Referring Provider: [name], MD  DOB: Dec 01, 1955  PCP: [name], MD  DOS: 05/21/2018  Note by: Vevelyn Francois NP  Service setting: Ambulatory outpatient  Specialty: Interventional Pain Management  Location: [facility] (AMB) Pain Management Facility    Patient type: Established    Primary Reason(s) for Visit: Encounter for prescription drug management. (Level of risk: moderate)  CC: Back Pain (low); Hip Pain (bilateral); and Leg Pain (bilateral)  HPI  James [name] is a 62 y.o. year old, male patient, who comes today for a medication management evaluation. He has Airway hyperreactivity; Atrial fibrillation ([place]); Narrowing of intervertebral disc space; Abnormal echocardiogram; Gastric ulcer; Gastric catarrh; Acid reflux; Awareness of heartbeats; Hemorrhoid; Hepatitis; BP (high blood pressure); Lactose intolerance; Osteopenia; APC (atrial premature contractions); Apnea, sleep; Supraventricular tachycardia ([place]); Long term current use of opiate analgesic; Long term prescription opiate use; Opiate use (120 MME/Day); Opiate dependence ([place]); Encounter for therapeutic drug level monitoring; Chronic low back pain (Primary Source of Pain) (Bilateral) (R>L); Lumbar spondylosis; Chronic neck pain (Right); Cervical spondylosis (C7 intravertebral body cyst); Chronic cervical radicular pain (Right); Chronic lumbar radicular pain (Bilateral) (L>R) (L5 Dermatome); Osteoarthritis; Chronic hip pain; Chronic knee pain (Bilateral) (R>L); Complex regional pain syndrome type II of upper extremity (Right); Chronic upper extremity pain (Right); 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); Epidural fibrosis; Musculoskeletal pain; Neurogenic pain;  Neuropathic pain; Chronic sacroiliac joint pain (Bilateral) (R>L); Psoriatic arthritis ([place]); Controlled type 2 diabetes mellitus without complication ([place]); Chest pain; Acquired hypothyroidism; Chronic lower extremity pain (Secondary source of pain) (Bilateral) (L>R); Psoriasis with arthropathy ([place]); Hypothyroidism; Numbness and tingling of both legs; Abnormal nerve conduction studies (06/20/16); Chronic pain syndrome; Hypotestosteronism; Nausea without vomiting; Acute postoperative pain; Perineal pain; Acquired cyst of kidney; Urinary urgency; and Asthma on their problem list. His primarily concern today is the Back Pain (low); Hip Pain (bilateral); and Leg Pain (bilateral)  Pain Assessment: Location: Lower Back Radiating: hips and legs Onset: More than a month ago Duration: Chronic pain Quality: Constant, Throbbing, Stabbing Severity: 4 /10 (subjective, self-reported pain score)  Note: Reported level is compatible with observation.                          Timing: Constant Modifying factors: medications, rest BP: 121/72  HR: 70  James [name] was last scheduled for an appointment on 02/19/2018 for medication management. During today's appointment we reviewed James [name] chronic pain status, as well as his outpatient medication regimen. He admits that he has occasional falls with leg going numb. He denies any injuries. He admits that he has dizziness. He admits that sometimes it is oxygen related. He had images on last year.  He admits that he has nausea but this is related to other health problmes. He admits that dairy helps with constipation.   The patient  reports that he does not use drugs. His body mass index is 35.26 kg/m.  Further details on both, my assessment(s), as well as the proposed treatment plan, please see below.  Controlled Substance Pharmacotherapy Assessment REMS (Risk Evaluation and Mitigation Strategy)  Analgesic:Fentanyl patch 12.5 mcg/h every 72 hours + oxycodone  IR 20 mg every 8 hours (60 mg/day) MME/day:120 mg/day    Hart Rochester, RN  05/21/2018 12:59 PM  Sign at close encounter Nursing Pain Medication Assessment:  Safety precautions to be maintained throughout the outpatient stay will include: orient to surroundings, keep bed in low position, maintain call bell within reach at all times, provide assistance with transfer out of bed and ambulation.  Medication Inspection Compliance: Pill count conducted under aseptic conditions, in front of the patient. Neither the pills nor the bottle was removed from the patient's sight at any time. Once count was completed pills were immediately returned to the patient in their original bottle.  Medication #1: Fentanyl patch Pill/Patch Count: 6 of 10 patches remain Pill/Patch Appearance: Markings consistent with prescribed medication Bottle Appearance: Standard pharmacy container. Clearly labeled. Filled Date: 07 / 05 / 2019 Last Medication intake:  Yesterday  Medication #2: Oxycodone IR Pill/Patch Count: 53 of 90 pills remain Pill/Patch Appearance: Markings consistent with prescribed medication Bottle Appearance: Standard pharmacy container. Clearly labeled. Filled Date: 07 / 05 / 2019 Last Medication intake:  TodayNursing Pain Medication Assessment:  Safety precautions to be maintained throughout the outpatient stay will include: orient to surroundings, keep bed in low position, maintain call bell within reach at all times, provide assistance with transfer out of bed and ambulation.  Medication Inspection Compliance: Pill count conducted under aseptic conditions, in front of the patient. Neither the pills nor the bottle was removed from the patient's sight at any time. Once count was completed pills were immediately returned to the patient in their original bottle.  Medication: See above Pill/Patch Count: 0 of 0 pills remain Pill/Patch Appearance: N/A Bottle Appearance: N/A Filled Date: N/A / N/A /  N/A Last Medication intake:  N/A two meds counted as above   Pharmacokinetics: Liberation and absorption (onset of action): WNL Distribution (time to peak effect): WNL Metabolism and excretion (duration of action): WNL         Pharmacodynamics: Desired effects: Analgesia: James [name] reports >50% benefit. Functional ability: Patient reports that medication allows him to accomplish basic ADLs Clinically meaningful improvement in function (CMIF): Sustained CMIF goals met Perceived effectiveness: Described as relatively effective, allowing for increase in activities of daily living (ADL) Undesirable effects: Side-effects or Adverse reactions: None reported Monitoring: [place] PMP: Online review of the past 58-monthperiod conducted. Compliant with practice rules and regulations Last UDS on record: Summary  Date Value Ref Range Status  11/27/2017 FINAL  Final    Comment:    ==================================================================== TOXASSURE SELECT 13 (MW) ==================================================================== Test                             Result       Flag       Units Drug Present and Declared for Prescription Verification   Oxycodone                      3275         EXPECTED   ng/mg creat   Oxymorphone                    985          EXPECTED   ng/mg creat   Noroxycodone                   6016         EXPECTED   ng/mg creat   Noroxymorphone                 541  EXPECTED   ng/mg creat    Sources of oxycodone are scheduled prescription medications.    Oxymorphone, noroxycodone, and noroxymorphone are expected    metabolites of oxycodone. Oxymorphone is also available as a    scheduled prescription medication.   Fentanyl                       8            EXPECTED   ng/mg creat   Norfentanyl                    35           EXPECTED   ng/mg creat    Source of fentanyl is a scheduled prescription medication,    including IV, patch, and transmucosal  formulations. Norfentanyl    is an expected metabolite of fentanyl. ==================================================================== Test                      Result    Flag   Units      Ref Range   Creatinine              158              mg/dL      >=20 ==================================================================== Declared Medications:  The flagging and interpretation on this report are based on the  following declared medications.  Unexpected results may arise from  inaccuracies in the declared medications.  **Note: The testing scope of this panel includes these medications:  Fentanyl (Fentanyl Patch)  Oxycodone  **Note: The testing scope of this panel does not include following  reported medications:  Albuterol  Azelastine  Budenoside (Symbicort)  Folic acid  Formoterol (Symbicort)  Loratadine  Methotrexate  Metoprolol (Lopressor)  Pantoprazole (Protonix)  Promethazine (Phenergan)  Sucralfate (Carafate)  Tizanidine ==================================================================== For clinical consultation, please call [phone]. ====================================================================    UDS interpretation: Compliant          Medication Assessment Form: Reviewed. Patient indicates being compliant with therapy Treatment compliance: Compliant Risk Assessment Profile: Aberrant behavior: See prior evaluations. None observed or detected today Comorbid factors increasing risk of overdose: See prior notes. No additional risks detected today Risk of substance use disorder (SUD): Low Opioid Risk Tool - 05/21/18 0842      Family History of Substance Abuse   Alcohol  Negative    Illegal Drugs  Negative    Rx Drugs  Negative      Personal History of Substance Abuse   Alcohol  Negative    Illegal Drugs  Negative    Rx Drugs  Negative      Age   Age between 67-45 years   No      History of Preadolescent Sexual Abuse   History of  Preadolescent Sexual Abuse  Negative or Male      Psychological Disease   Psychological Disease  Negative    Depression  Negative      Total Score   Opioid Risk Tool Scoring  0    Opioid Risk Interpretation  Low Risk      ORT Scoring interpretation table:  Score <3 = Low Risk for SUD  Score between 4-7 = Moderate Risk for SUD  Score >8 = High Risk for Opioid Abuse   Risk Mitigation Strategies:  Patient Counseling: Covered Patient-Prescriber Agreement (PPA): Present and active  Notification to other healthcare providers: Done  Pharmacologic Plan: No change in  therapy, at this time.             Laboratory Chemistry  Inflammation Markers (CRP: Acute Phase) (ESR: Chronic Phase) Lab Results  Component Value Date   CRP <0.80 12/06/2016   ESRSEDRATE 9 12/06/2016                         Rheumatology Markers No results found for: RF, ANA, LABURIC, URICUR, LYMEIGGIGMAB, LYMEABIGMQN, HLAB27                      Renal Function Markers Lab Results  Component Value Date   BUN 14 12/06/2016   CREATININE 1.20 01/31/2018   GFRAA >60 12/06/2016   GFRNONAA >60 12/06/2016                             Hepatic Function Markers Lab Results  Component Value Date   AST 35 12/06/2016   ALT 33 12/06/2016   ALBUMIN 4.4 12/06/2016   ALKPHOS 47 12/06/2016                        Electrolytes Lab Results  Component Value Date   NA 139 12/06/2016   K 4.6 12/06/2016   CL 103 12/06/2016   CALCIUM 9.6 12/06/2016   MG 2.2 12/06/2016                        Neuropathy Markers Lab Results  Component Value Date   VITAMINB12 423 12/06/2016   HGBA1C 5.2 12/12/2017                        Bone Pathology Markers Lab Results  Component Value Date   25OHVITD1 42 12/06/2016   25OHVITD2 <1.0 12/06/2016   25OHVITD3 42 12/06/2016   TESTOSTERONE 285 02/12/2018                         Coagulation Parameters No results found for: INR, LABPROT, APTT, PLT, DDIMER                       Cardiovascular Markers No results found for: BNP, CKTOTAL, CKMB, TROPONINI, HGB, HCT                       CA Markers No results found for: CEA, CA125, LABCA2                      Note: Lab results reviewed.  Recent Diagnostic Imaging Results  DG Sniff Test CLINICAL DATA:  Increasing shortness of breath over the past several years. Found to have elevated right hemidiaphragm on previous CT scan. History of shrapnel wounds.  EXAM: CHEST FLUOROSCOPY  TECHNIQUE: Real-time fluoroscopic evaluation of the chest was performed.  FLUOROSCOPY TIME:  Fluoroscopy Time:  6 seconds  Radiation Exposure Index (if provided by the fluoroscopic device): 41.5 mGy  Number of Acquired Spot Images: 3 video loops  COMPARISON:  CT scan of the chest of January 24, 2017  FINDINGS: There is normal respiratory motion of the left hemidiaphragm. The mildly elevated right hemidiaphragm reveals paradoxical motion during deep inspiration and during the sniff maneuver.  IMPRESSION: Findings compatible with right hemidiaphragm paresis. No purposeful motion of the right hemidiaphragm is observed.  Electronically Signed  By: David  Martinique M.D.   On: 02/11/2018 09:10  Complexity Note: Imaging results reviewed. Results shared with James [name], using Layman's terms.                         Meds   Current Outpatient Medications:  .  albuterol (PROAIR HFA) 108 (90 BASE) MCG/ACT inhaler, Inhale 2 puffs into the lungs every 4 (four) hours as needed for wheezing or shortness of breath. , Disp: , Rfl:  .  azelastine (ASTELIN) 0.1 % nasal spray, Place 1 spray into both nostrils 2 (two) times daily. , Disp: , Rfl:  .  b complex vitamins tablet, Take 1 tablet by mouth 2 (two) times daily., Disp: , Rfl:  .  cholecalciferol (VITAMIN D) 1000 units tablet, Take 5,000 Units by mouth daily. , Disp: , Rfl:  .  [START ON 07/06/2018] fentaNYL (DURAGESIC - DOSED MCG/HR) 12 MCG/HR, Place 1 patch (12.5 mcg total) onto the  skin every 3 (three) days., Disp: 10 patch, Rfl: 0 .  folic acid (FOLVITE) 1 MG tablet, Take 2 mg by mouth 2 (two) times daily. , Disp: , Rfl:  .  loratadine (CLARITIN) 10 MG tablet, Take 10 mg by mouth daily., Disp: , Rfl:  .  methotrexate 50 MG/2ML injection, Inject 50 mg/m2 as directed once a week. , Disp: , Rfl:  .  metoprolol (LOPRESSOR) 100 MG tablet, TAKE 1 TABLET BY MOUTH TWICE A DAY, Disp: , Rfl:  .  naloxone (NARCAN) nasal spray 4 mg/0.1 mL, Spray into one nostril. Repeat with second device into other nostril after 2-3 minutes if no or minimal response. Use in case of opioid overdose., Disp: 1 kit, Rfl: 0 .  [START ON 07/06/2018] Oxycodone HCl 20 MG TABS, Take 1 tablet (20 mg total) by mouth every 8 (eight) hours as needed., Disp: 90 tablet, Rfl: 0 .  promethazine (PHENERGAN) 12.5 MG tablet, Take by mouth every 4 (four) hours as needed. , Disp: , Rfl:  .  sucralfate (CARAFATE) 1 G tablet, Take 1 g by mouth 4 (four) times daily. , Disp: , Rfl:  .  tiZANidine (ZANAFLEX) 4 MG capsule, Take 4 mg by mouth 3 (three) times daily as needed for muscle spasms., Disp: , Rfl:  .  [START ON 08/05/2018] fentaNYL (DURAGESIC - DOSED MCG/HR) 12 MCG/HR, Place 1 patch (12.5 mcg total) onto the skin every 3 (three) days., Disp: 10 patch, Rfl: 0 .  [START ON 06/06/2018] fentaNYL (DURAGESIC - DOSED MCG/HR) 12 MCG/HR, Place 1 patch (12.5 mcg total) onto the skin every 3 (three) days., Disp: 10 patch, Rfl: 0 .  [START ON 08/05/2018] Oxycodone HCl 20 MG TABS, Take 1 tablet (20 mg total) by mouth every 8 (eight) hours., Disp: 90 tablet, Rfl: 0 .  [START ON 06/06/2018] Oxycodone HCl 20 MG TABS, Take 1 tablet (20 mg total) by mouth every 8 (eight) hours as needed., Disp: 90 tablet, Rfl: 0  ROS  Constitutional: Denies any fever or chills Gastrointestinal: No reported hemesis, hematochezia, vomiting, or acute GI distress Musculoskeletal: Denies any acute onset joint swelling, redness, loss of ROM, or weakness Neurological:  No reported episodes of acute onset apraxia, aphasia, dysarthria, agnosia, amnesia, paralysis, loss of coordination, or loss of consciousness  Allergies  James [name] is allergic to penicillin g; penicillins; lactose; nsaids; procaine; sulfasalazine; talwin [pentazocine]; codeine; and sulfa antibiotics.  [place]  Drug: James [name]  reports that he does not use drugs. Alcohol:  reports that  he does not drink alcohol. Tobacco:  reports that he has quit smoking. His smoking use included cigarettes. His smokeless tobacco use includes chew. 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 Surgical: [name]  has a past surgical history that includes Spine surgery; Eye surgery; Fracture surgery (Right); Fracture surgery (Bilateral); Fracture surgery (Bilateral); Joint replacement (Right); Spinal cord stimulator implant; Spinal cord stimulator removal; Patella arthroplasty; Shoulder arthroscopy (Bilateral); Esophagogastroduodenoscopy (egd) with propofol (N/A, 01/20/2016); and Colonoscopy (2009). Family: family history includes Arthritis in his father and mother; COPD in his father; Cancer in his father and mother; Diabetes in his mother; Hematuria in his father and mother; Hypertension in his father and mother; Kidney disease in his mother; Kidney failure in his mother; Prostate cancer in his father; Stroke in his mother.  Constitutional Exam  General appearance: Well nourished, well  developed, and well hydrated. In no apparent acute distress Vitals:   05/21/18 0835  BP: 121/72  Pulse: 70  Resp: 18  Temp: 97.6 F (36.4 C)  TempSrc: Oral  SpO2: 97%  Weight: 260 lb (117.9 kg)  Height: 6' (1.829 m)  Psych/Mental status: Alert, oriented x 3 (person, place, & time)       Eyes: PERLA Respiratory: No evidence of acute respiratory distress  Lumbar Spine Area Exam  Skin & Axial Inspection: Well healed scar from previous spine surgery detected Alignment: Symmetrical Functional ROM: Unrestricted ROM       Stability: No instability detected Muscle Tone/Strength: Functionally intact. No obvious neuro-muscular anomalies detected. Sensory (Neurological): Unimpaired Palpation: No palpable anomalies       Provocative Tests: Lumbar Hyperextension/rotation test: deferred today       Lumbar quadrant test (Kemp's test): deferred today       Lumbar Lateral bending test: deferred today       Patrick's Maneuver: deferred today                   FABER test: deferred today                   Thigh-thrust test: deferred today       S-I compression test: deferred today       S-I distraction test: deferred today        Gait & Posture Assessment  Ambulation: Unassisted Gait: Relatively normal for age and body habitus Posture: WNL   Lower Extremity Exam    Side: Right lower extremity  Side: Left lower extremity  Stability: No instability observed          Stability: No instability observed          Skin & Extremity Inspection: Skin color, temperature, and hair growth are WNL. No peripheral edema or cyanosis. No masses, redness, swelling, asymmetry, or associated skin lesions. No contractures.  Skin & Extremity Inspection: Skin color, temperature, and hair growth are WNL. No peripheral edema or cyanosis. No masses, redness, swelling, asymmetry, or associated skin lesions. No contractures.  Functional ROM: Unrestricted ROM                  Functional ROM: Unrestricted ROM  Muscle Tone/Strength: Functionally intact. No obvious neuro-muscular anomalies detected.  Muscle Tone/Strength: Functionally intact. No obvious neuro-muscular anomalies detected.  Sensory (Neurological): Unimpaired  Sensory (Neurological): Unimpaired  Palpation: No palpable anomalies  Palpation: No palpable anomalies   Assessment  Primary Diagnosis & Pertinent Problem List: The primary encounter diagnosis was Lumbar spondylosis. Diagnoses of Complex regional pain syndrome type II of upper extremity (Right), Chronic sacroiliac joint pain (Bilateral) (R>L), Chronic pain syndrome, and Long term prescription opiate use were also pertinent to this visit.  Status Diagnosis  Persistent Controlled Controlled 1. Lumbar spondylosis   2. Complex regional pain syndrome type II of upper extremity (Right)   3. Chronic sacroiliac joint pain (Bilateral) (R>L)   4. Chronic pain syndrome   5. Long term prescription opiate use     Problems updated and reviewed during this visit: Problem  Asthma   Plan of Care  Pharmacotherapy (Medications Ordered): Meds ordered this encounter  Medications  . Oxycodone HCl 20 MG TABS    Sig: Take 1 tablet (20 mg total) by mouth every 8 (eight) hours.    Dispense:  90 tablet    Refill:  0    Do not place this medication, or any other prescription from our practice, on "Automatic Refill". Patient may have prescription filled one day early if pharmacy is closed on scheduled refill date. Do not fill until:08/05/2018 To last until:09/04/2018    Order Specific Question:   Supervising Provider    Answer:   Milinda Pointer [phone]  . Oxycodone HCl 20 MG TABS    Sig: Take 1 tablet (20 mg total) by mouth every 8 (eight) hours as needed.    Dispense:  90 tablet    Refill:  0    Do not place this medication, or any other prescription from our practice, on "Automatic Refill". Patient may have prescription filled one day early if pharmacy is closed on scheduled refill  date. Do not fill until: 07/06/2018 To last until:08/05/2018    Order Specific Question:   Supervising Provider    Answer:   Milinda Pointer [phone]  . Oxycodone HCl 20 MG TABS    Sig: Take 1 tablet (20 mg total) by mouth every 8 (eight) hours as needed.    Dispense:  90 tablet    Refill:  0    Do not place this medication, or any other prescription from our practice, on "Automatic Refill". Patient may have prescription filled one day early if pharmacy is closed on scheduled refill date. Do not fill until: 06/06/2018 To last until: 07/06/2018    Order Specific Question:   Supervising Provider    Answer:   Milinda Pointer [phone]  . fentaNYL (DURAGESIC - DOSED MCG/HR) 12 MCG/HR    Sig: Place 1 patch (12.5 mcg total) onto the skin every 3 (three) days.    Dispense:  10 patch    Refill:  0    Do not place this medication, or any other prescription from our practice, on "Automatic Refill". Patient may have prescription filled one day early if pharmacy is closed on scheduled refill date. Do not fill until: 08/05/2018 To last until:09/04/2018    Order Specific Question:   Supervising Provider    Answer:   Milinda Pointer [phone]  . fentaNYL (DURAGESIC - DOSED MCG/HR) 12 MCG/HR    Sig: Place 1 patch (12.5 mcg total) onto the skin every 3 (three) days.    Dispense:  10 patch    Refill:  0  Do not place this medication, or any other prescription from our practice, on "Automatic Refill". Patient may have prescription filled one day early if pharmacy is closed on scheduled refill date. Do not fill until:07/06/2018 To last until: 08/05/2018    Order Specific Question:   Supervising Provider    Answer:   Milinda Pointer [phone]  . fentaNYL (DURAGESIC - DOSED MCG/HR) 12 MCG/HR    Sig: Place 1 patch (12.5 mcg total) onto the skin every 3 (three) days.    Dispense:  10 patch    Refill:  0    DO NOT DELETE, even if Expired!!! Do not place this medication, or any other prescription from our  practice, on "Automatic Refill". Patient may have prescription filled one day early if pharmacy is closed on scheduled refill date. Do not fill until:06/06/2018 To last until:07/06/2018    Order Specific Question:   Supervising Provider    Answer:   Milinda Pointer [phone]   New Prescriptions   No medications on file   Medications administered today: James [name] had no medications administered during this visit. Lab-work, procedure(s), and/or referral(s): Orders Placed This Encounter  Procedures  . ToxASSURE Select 13 (MW), Urine   Imaging and/or referral(s): None  Interventional therapies: Planned, scheduled, and/or pending: Not at this time.He admits that he was to have pulmonary rehab. Encouraged PT also   Considering:  Diagnostic Right-sided cervical epiduralsteroid injection.  Diagnostic Right-sided L4-5 lumbar epiduralsteroid injection.  Diagnostic bilateral lumbar facetblock. Possible bilateral lumbar facet radiofrequencyablation. Diagnostic bilateral cervical facetblock.  Possible bilateral cervical facet radiofrequencyablation.   Palliative PRN treatment(s):  For the arm pain and numbness, right-sided cervical epidural steroid injection For the lower extremity pain, right-sided L4-5 lumbar epidural steroid injection For the low back pain, diagnostic bilateral lumbar facet block For the neck pain, diagnostic bilateral cervical facet block      Provider-requested follow-up: Return in about 3 months (around 08/21/2018) for MedMgmt with Me Dionisio David).  Future Appointments  Date Time Provider [place]  05/28/2018  9:30 AM Dimitri Ped, RN [facility] None  08/21/2018  8:30 AM Vevelyn Francois, NP [facility] None   Primary Care Physician: [name], MD Location: [facility] Note by: Vevelyn Francois NP Date: 05/21/2018; Time: 4:45 PM  Pain Score Disclaimer: We use the NRS-11 scale. This is a  self-reported, subjective measurement of pain severity with only modest accuracy. It is used primarily to identify changes within a particular patient. It must be understood that outpatient pain scales are significantly less accurate that those used for research, where they can be applied under ideal controlled circumstances with minimal exposure to variables. In reality, the score is likely to be a combination of pain intensity and pain affect, where pain affect describes the degree of emotional arousal or changes in action readiness caused by the sensory experience of pain. Factors such as social and work situation, setting, emotional state, anxiety levels, expectation, and prior pain experience may influence pain perception and show large inter-individual differences that may also be affected by time variables.  Patient instructions provided during this appointment: Patient Instructions   ____________________________________________________________________________________________  Medication Rules  Applies to: All patients receiving prescriptions (written or electronic).  Pharmacy of record: Pharmacy where electronic prescriptions will be sent. If written prescriptions are taken to a different pharmacy, please inform the nursing staff. The pharmacy listed in the electronic medical record should be the one where you would like electronic prescriptions to be sent.  Prescription refills: Only during scheduled appointments. Applies to both, written and electronic prescriptions.  NOTE: The following applies primarily to controlled substances (Opioid* Pain Medications).   Patient's responsibilities: 1. Pain Pills: Bring all pain pills to every appointment (except for procedure appointments). 2. Pill Bottles: Bring pills in original pharmacy bottle. Always bring newest bottle. Bring bottle, even if empty. 3. Medication refills: You are responsible for knowing and keeping track of what medications you  need refilled. The day before your appointment, write a list of all prescriptions that need to be refilled. Bring that list to your appointment and give it to the admitting nurse. Prescriptions will be written only during appointments. If you forget a medication, it will not be "Called in", "Faxed", or "electronically sent". You will need to get another appointment to get these prescribed. 4. Prescription Accuracy: You are responsible for carefully inspecting your prescriptions before leaving our office. Have the discharge nurse carefully go over each prescription with you, before taking them home. Make sure that your name is accurately spelled, that your address is correct. Check the name and dose of your medication to make sure it is accurate. Check the number of pills, and the written instructions to make sure they are clear and accurate. Make sure that you are given enough medication to last until your next medication refill appointment. 5. Taking Medication: Take medication as prescribed. Never take more pills than instructed. Never take medication more frequently than prescribed. Taking less pills or less frequently is permitted and encouraged, when it comes to controlled substances (written prescriptions).  6. Inform other Doctors: Always inform, all of your healthcare providers, of all the medications you take. 7. Pain Medication from other Providers: You are not allowed to accept any additional pain medication from any other Doctor or Healthcare provider. There are two exceptions to this rule. (see below) In the event that you require additional pain medication, you are responsible for notifying us, as stated below. 8. Medication Agreement: You are responsible for carefully reading and following our Medication Agreement. This must be signed before receiving any prescriptions from our practice. Safely store a copy of your signed Agreement. Violations to the Agreement will result in no further  prescriptions. (Additional copies of our Medication Agreement are available upon request.) 9. Laws, Rules, & Regulations: All patients are expected to follow all Federal and [employer], [employer], Rules, [employer]. Ignorance of the Laws does not constitute a valid excuse. The use of any illegal substances is prohibited. 10. Adopted CDC guidelines & recommendations: Target dosing levels will be at or below 60 MME/day. Use of benzodiazepines** is not recommended.  Exceptions: There are only two exceptions to the rule of not receiving pain medications from other Healthcare Providers. 1. Exception #1 (Emergencies): In the event of an emergency (i.e.: accident requiring emergency care), you are allowed to receive additional pain medication. However, you are responsible for: As soon as you are able, call our office [phone], at any time of the day or night, and leave a message stating your name, the date and nature of the emergency, and the name and dose of the medication prescribed. In the event that your call is answered by a member of our staff, make sure to document and save the date, time, and the name of the person that took your information.  2. Exception #2 (Planned Surgery): In the event that you are scheduled by another doctor or dentist to have any type of surgery or procedure, you  are allowed (for a period no longer than 30 days), to receive additional pain medication, for the acute post-op pain. However, in this case, you are responsible for picking up a copy of our "Post-op Pain Management for Surgeons" handout, and giving it to your surgeon or dentist. This document is available at our office, and does not require an appointment to obtain it. Simply go to our office during business hours (Monday-Thursday from 8:00 AM to 4:00 PM) (Friday 8:00 AM to 12:00 Noon) or if you have a scheduled appointment with [place], prior to your surgery, and ask for it by name. In addition, you will need to provide [place]  with your name, name of your surgeon, type of surgery, and date of procedure or surgery.  *Opioid medications include: morphine, codeine, oxycodone, oxymorphone, hydrocodone, hydromorphone, meperidine, tramadol, tapentadol, buprenorphine, fentanyl, methadone. **Benzodiazepine medications include: diazepam (Valium), alprazolam (Xanax), clonazepam (Klonopine), lorazepam (Ativan), clorazepate (Tranxene), chlordiazepoxide (Librium), estazolam (Prosom), oxazepam (Serax), temazepam (Restoril), triazolam (Halcion) (Last updated: 01/02/2018) ____________________________________________________________________________________________   BMI Assessment: Estimated body mass index is 35.26 kg/m as calculated from the following:   Height as of this encounter: 6' (1.829 m).   Weight as of this encounter: 260 lb (117.9 kg).  BMI interpretation table: BMI level Category Range association with higher incidence of chronic pain  <18 kg/m2 Underweight   18.5-24.9 kg/m2 Ideal body weight   25-29.9 kg/m2 Overweight Increased incidence by 20%  30-34.9 kg/m2 Obese (Class I) Increased incidence by 68%  35-39.9 kg/m2 Severe obesity (Class II) Increased incidence by 136%  >40 kg/m2 Extreme obesity (Class III) Increased incidence by 254%   Patient's current BMI Ideal Body weight  Body mass index is 35.26 kg/m. Ideal body weight: 77.6 kg (171 lb 1.2 oz) Adjusted ideal body weight: 93.7 kg (206 lb 10.3 oz)   BMI Readings from Last 4 Encounters:  05/21/18 35.26 kg/m  02/19/18 36.62 kg/m  02/12/18 37.35 kg/m  01/13/18 37.66 kg/m   Wt Readings from Last 4 Encounters:  05/21/18 260 lb (117.9 kg)  02/19/18 270 lb (122.5 kg)  02/12/18 267 lb 12.8 oz (121.5 kg)  01/13/18 270 lb (122.5 kg)   Duragesic and oxycodone prescription given x3.

## 2018-05-21 NOTE — Progress Notes (Signed)
Nursing Pain Medication Assessment:  Safety precautions to be maintained throughout the outpatient stay will include: orient to surroundings, keep bed in low position, maintain call bell within reach at all times, provide assistance with transfer out of bed and ambulation.  Medication Inspection Compliance: Pill count conducted under aseptic conditions, in front of the patient. Neither the pills nor the bottle was removed from the patient's sight at any time. Once count was completed pills were immediately returned to the patient in their original bottle.  Medication #1: Fentanyl patch Pill/Patch Count: 6 of 10 patches remain Pill/Patch Appearance: Markings consistent with prescribed medication Bottle Appearance: Standard pharmacy container. Clearly labeled. Filled Date: 07 / 05 / 2019 Last Medication intake:  Yesterday  Medication #2: Oxycodone IR Pill/Patch Count: 53 of 90 pills remain Pill/Patch Appearance: Markings consistent with prescribed medication Bottle Appearance: Standard pharmacy container. Clearly labeled. Filled Date: 07 / 05 / 2019 Last Medication intake:  TodayNursing Pain Medication Assessment:  Safety precautions to be maintained throughout the outpatient stay will include: orient to surroundings, keep bed in low position, maintain call bell within reach at all times, provide assistance with transfer out of bed and ambulation.  Medication Inspection Compliance: Pill count conducted under aseptic conditions, in front of the patient. Neither the pills nor the bottle was removed from the patient's sight at any time. Once count was completed pills were immediately returned to the patient in their original bottle.  Medication: See above Pill/Patch Count: 0 of 0 pills remain Pill/Patch Appearance: N/A Bottle Appearance: N/A Filled Date: N/A / N/A / N/A Last Medication intake:  N/A two meds counted as above

## 2018-05-26 LAB — TOXASSURE SELECT 13 (MW), URINE

## 2018-05-28 ENCOUNTER — Other Ambulatory Visit: Payer: Self-pay

## 2018-05-28 NOTE — Patient Outreach (Signed)
[facility]) Care Management  05/28/2018   JORDAN PARDINI [birth year] [id number]  Scheduled telephone visit for disease management program for A.Fib No answer and message left.   Plan:  Plan to send unsuccessful outreach letter and will attempt to contact member in 3-4 business days . Peter Garter RN, [facility], CDE Care Management Coordinator [facility] Care Management [phone]

## 2018-06-02 ENCOUNTER — Other Ambulatory Visit: Payer: Self-pay

## 2018-06-02 NOTE — Patient Outreach (Signed)
[place] [facility]) Care Management  06/02/2018  [name] [birth year] [id number]  Telephone call  for disease management program for A.Fib No answer and message left. 2nd attempt.  Plan:  Plan to attempt to contact member in 3-4 business days.  He was sent unsuccessful outreach letter on 05/28/18 Peter Garter RN, [facility], CDE Care Management Coordinator [facility] Care Management [phone]

## 2018-06-05 ENCOUNTER — Ambulatory Visit: Payer: Self-pay

## 2018-06-06 ENCOUNTER — Ambulatory Visit: Payer: Self-pay

## 2018-06-09 ENCOUNTER — Other Ambulatory Visit: Payer: Self-pay

## 2018-06-09 NOTE — Patient Outreach (Signed)
[place] [facility]) Care Management  06/09/2018  MOHAN ERVEN [birth year] [id number]   Telephone call  for disease management program for A.Fib No answer and message left.3nd attempt. Plan to close case if member does not return message by 06/12/18  Peter Garter RN, [facility], CDE Care Management Coordinator [facility] Care Management [phone]

## 2018-06-12 ENCOUNTER — Other Ambulatory Visit: Payer: Self-pay

## 2018-06-12 NOTE — Patient Outreach (Signed)
[place] [facility]) Care Management  06/12/2018  [place] [name] 1956/03/10 [id number]   Member has not responded to past 3 outreach calls. Case closed as member has withdrawn from the program. Plan to send discharger letter to member and provider.  Peter Garter RN, [facility] [place], CDE Care Management Coordinator [facility] Care Management [phone]

## 2018-07-01 ENCOUNTER — Telehealth: Payer: Self-pay | Admitting: Pain Medicine

## 2018-07-01 NOTE — Telephone Encounter (Signed)
Patient is in [name], [place] and cannot get back here to our office to get procedure done. Wants to get some records sent to him so he can see a Physician there for a procedure. He is going to be there for a while and would like to have procedure. No meds refill. Please ask Dr. Dossie Arbour if this will be ok with him and let patient know.

## 2018-07-01 NOTE — Telephone Encounter (Signed)
Patient advised to call Medical Records department to get his records.

## 2018-07-11 DIAGNOSIS — Z79899 Other long term (current) drug therapy: Secondary | ICD-10-CM | POA: Diagnosis not present

## 2018-07-11 DIAGNOSIS — M25552 Pain in left hip: Secondary | ICD-10-CM | POA: Diagnosis not present

## 2018-07-11 DIAGNOSIS — M7541 Impingement syndrome of right shoulder: Secondary | ICD-10-CM | POA: Diagnosis not present

## 2018-07-11 DIAGNOSIS — M8900 Algoneurodystrophy, unspecified site: Secondary | ICD-10-CM | POA: Diagnosis not present

## 2018-07-11 DIAGNOSIS — M5412 Radiculopathy, cervical region: Secondary | ICD-10-CM | POA: Diagnosis not present

## 2018-07-11 DIAGNOSIS — M961 Postlaminectomy syndrome, not elsewhere classified: Secondary | ICD-10-CM | POA: Diagnosis not present

## 2018-07-11 DIAGNOSIS — M47816 Spondylosis without myelopathy or radiculopathy, lumbar region: Secondary | ICD-10-CM | POA: Diagnosis not present

## 2018-07-11 DIAGNOSIS — M1611 Unilateral primary osteoarthritis, right hip: Secondary | ICD-10-CM | POA: Diagnosis not present

## 2018-08-04 DIAGNOSIS — R05 Cough: Secondary | ICD-10-CM | POA: Diagnosis not present

## 2018-08-04 DIAGNOSIS — J310 Chronic rhinitis: Secondary | ICD-10-CM | POA: Diagnosis not present

## 2018-08-04 DIAGNOSIS — R0609 Other forms of dyspnea: Secondary | ICD-10-CM | POA: Diagnosis not present

## 2018-08-04 DIAGNOSIS — J986 Disorders of diaphragm: Secondary | ICD-10-CM | POA: Diagnosis not present

## 2018-08-21 ENCOUNTER — Ambulatory Visit: Payer: PPO | Attending: Nurse Practitioner | Admitting: Nurse Practitioner

## 2018-08-21 ENCOUNTER — Encounter: Payer: Self-pay | Admitting: Nurse Practitioner

## 2018-08-21 ENCOUNTER — Other Ambulatory Visit: Payer: Self-pay

## 2018-08-21 VITALS — BP 141/89 | HR 66 | Temp 97.5°F | Ht 72.0 in | Wt 270.0 lb

## 2018-08-21 DIAGNOSIS — M25561 Pain in right knee: Secondary | ICD-10-CM | POA: Insufficient documentation

## 2018-08-21 DIAGNOSIS — M47892 Other spondylosis, cervical region: Secondary | ICD-10-CM | POA: Diagnosis not present

## 2018-08-21 DIAGNOSIS — Z880 Allergy status to penicillin: Secondary | ICD-10-CM | POA: Insufficient documentation

## 2018-08-21 DIAGNOSIS — M47896 Other spondylosis, lumbar region: Secondary | ICD-10-CM | POA: Diagnosis not present

## 2018-08-21 DIAGNOSIS — M79601 Pain in right arm: Secondary | ICD-10-CM

## 2018-08-21 DIAGNOSIS — L4050 Arthropathic psoriasis, unspecified: Secondary | ICD-10-CM | POA: Insufficient documentation

## 2018-08-21 DIAGNOSIS — M5412 Radiculopathy, cervical region: Secondary | ICD-10-CM | POA: Insufficient documentation

## 2018-08-21 DIAGNOSIS — M25552 Pain in left hip: Secondary | ICD-10-CM | POA: Insufficient documentation

## 2018-08-21 DIAGNOSIS — I4891 Unspecified atrial fibrillation: Secondary | ICD-10-CM | POA: Insufficient documentation

## 2018-08-21 DIAGNOSIS — M7918 Myalgia, other site: Secondary | ICD-10-CM | POA: Insufficient documentation

## 2018-08-21 DIAGNOSIS — R079 Chest pain, unspecified: Secondary | ICD-10-CM | POA: Insufficient documentation

## 2018-08-21 DIAGNOSIS — M5416 Radiculopathy, lumbar region: Secondary | ICD-10-CM | POA: Diagnosis not present

## 2018-08-21 DIAGNOSIS — G8929 Other chronic pain: Secondary | ICD-10-CM | POA: Diagnosis not present

## 2018-08-21 DIAGNOSIS — J45909 Unspecified asthma, uncomplicated: Secondary | ICD-10-CM | POA: Insufficient documentation

## 2018-08-21 DIAGNOSIS — M47812 Spondylosis without myelopathy or radiculopathy, cervical region: Secondary | ICD-10-CM | POA: Diagnosis not present

## 2018-08-21 DIAGNOSIS — G4730 Sleep apnea, unspecified: Secondary | ICD-10-CM | POA: Insufficient documentation

## 2018-08-21 DIAGNOSIS — Z6836 Body mass index (BMI) 36.0-36.9, adult: Secondary | ICD-10-CM | POA: Insufficient documentation

## 2018-08-21 DIAGNOSIS — M25512 Pain in left shoulder: Secondary | ICD-10-CM | POA: Insufficient documentation

## 2018-08-21 DIAGNOSIS — M25562 Pain in left knee: Secondary | ICD-10-CM | POA: Diagnosis not present

## 2018-08-21 DIAGNOSIS — Z87891 Personal history of nicotine dependence: Secondary | ICD-10-CM | POA: Insufficient documentation

## 2018-08-21 DIAGNOSIS — G894 Chronic pain syndrome: Secondary | ICD-10-CM

## 2018-08-21 DIAGNOSIS — I491 Atrial premature depolarization: Secondary | ICD-10-CM | POA: Insufficient documentation

## 2018-08-21 DIAGNOSIS — E1140 Type 2 diabetes mellitus with diabetic neuropathy, unspecified: Secondary | ICD-10-CM | POA: Diagnosis not present

## 2018-08-21 DIAGNOSIS — M545 Low back pain: Secondary | ICD-10-CM | POA: Insufficient documentation

## 2018-08-21 DIAGNOSIS — M8568 Other cyst of bone, other site: Secondary | ICD-10-CM | POA: Insufficient documentation

## 2018-08-21 DIAGNOSIS — Z5181 Encounter for therapeutic drug level monitoring: Secondary | ICD-10-CM | POA: Diagnosis not present

## 2018-08-21 DIAGNOSIS — M810 Age-related osteoporosis without current pathological fracture: Secondary | ICD-10-CM | POA: Insufficient documentation

## 2018-08-21 DIAGNOSIS — M79603 Pain in arm, unspecified: Secondary | ICD-10-CM

## 2018-08-21 DIAGNOSIS — Z79891 Long term (current) use of opiate analgesic: Secondary | ICD-10-CM | POA: Insufficient documentation

## 2018-08-21 DIAGNOSIS — M25551 Pain in right hip: Secondary | ICD-10-CM | POA: Insufficient documentation

## 2018-08-21 DIAGNOSIS — I471 Supraventricular tachycardia: Secondary | ICD-10-CM | POA: Insufficient documentation

## 2018-08-21 DIAGNOSIS — M79602 Pain in left arm: Secondary | ICD-10-CM

## 2018-08-21 DIAGNOSIS — M47816 Spondylosis without myelopathy or radiculopathy, lumbar region: Secondary | ICD-10-CM

## 2018-08-21 DIAGNOSIS — M533 Sacrococcygeal disorders, not elsewhere classified: Secondary | ICD-10-CM | POA: Insufficient documentation

## 2018-08-21 DIAGNOSIS — K219 Gastro-esophageal reflux disease without esophagitis: Secondary | ICD-10-CM | POA: Insufficient documentation

## 2018-08-21 DIAGNOSIS — Z79899 Other long term (current) drug therapy: Secondary | ICD-10-CM | POA: Insufficient documentation

## 2018-08-21 DIAGNOSIS — I10 Essential (primary) hypertension: Secondary | ICD-10-CM | POA: Insufficient documentation

## 2018-08-21 DIAGNOSIS — M25511 Pain in right shoulder: Secondary | ICD-10-CM | POA: Diagnosis not present

## 2018-08-21 DIAGNOSIS — M792 Neuralgia and neuritis, unspecified: Secondary | ICD-10-CM | POA: Diagnosis not present

## 2018-08-21 DIAGNOSIS — M542 Cervicalgia: Secondary | ICD-10-CM

## 2018-08-21 DIAGNOSIS — E669 Obesity, unspecified: Secondary | ICD-10-CM | POA: Insufficient documentation

## 2018-08-21 DIAGNOSIS — E039 Hypothyroidism, unspecified: Secondary | ICD-10-CM | POA: Insufficient documentation

## 2018-08-21 MED ORDER — OXYCODONE HCL 20 MG PO TABS: 20.0000 mg | ORAL_TABLET | Freq: Three times a day (TID) | ORAL | 0 refills | Status: DC | PRN

## 2018-08-21 MED ORDER — OXYCODONE HCL 20 MG PO TABS: 20.0000 mg | ORAL_TABLET | Freq: Three times a day (TID) | ORAL | 0 refills | Status: DC

## 2018-08-21 NOTE — Progress Notes (Signed)
Nursing Pain Medication Assessment:  Safety precautions to be maintained throughout the outpatient stay will include: orient to surroundings, keep bed in low position, maintain call bell within reach at all times, provide assistance with transfer out of bed and ambulation.  Medication Inspection Compliance: Pill count conducted under aseptic conditions, in front of the patient. Neither the pills nor the bottle was removed from the patient's sight at any time. Once count was completed pills were immediately returned to the patient in their original bottle.  Medication: Oxycodone IR Pill/Patch Count: 39 of 90 pills remain Pill/Patch Appearance: Markings consistent with prescribed medication Bottle Appearance: Standard pharmacy container. Clearly labeled. Filled Date: 09 / 30 / 2019 Last Medication intake:  Today

## 2018-08-21 NOTE — Progress Notes (Signed)
Patient's Name: James [name]  MRN: [id number]  Referring Provider: [name], MD  DOB: 01-Apr-1956  PCP: [name], MD  DOS: 08/21/2018  Note by: Vevelyn Francois NP  Service setting: Ambulatory outpatient  Specialty: Interventional Pain Management  Location: [facility] (AMB) Pain Management Facility    Patient type: Established    Primary Reason(s) for Visit: Encounter for prescription drug management. (Level of risk: moderate)  CC: Back Pain; Shoulder Pain (bilateral); Hip Pain (bilateral); and Knee Pain (bilateral)  HPI  [name] is a 62 y.o. year old, male patient, who comes today for a medication management evaluation. He has Airway hyperreactivity; Atrial fibrillation ([place]); Narrowing of intervertebral disc space; Abnormal echocardiogram; Gastric ulcer; Gastric catarrh; Acid reflux; Awareness of heartbeats; Hemorrhoid; Hepatitis; BP (high blood pressure); Lactose intolerance; Osteopenia; APC (atrial premature contractions); Apnea, sleep; Supraventricular tachycardia ([place]); Long term current use of opiate analgesic; Long term prescription opiate use; Opiate use (120 MME/Day); Opiate dependence ([place]); Encounter for therapeutic drug level monitoring; Chronic low back pain (Primary Source of Pain) (Bilateral) (R>L); Lumbar spondylosis; Chronic neck pain (Right); Cervical spondylosis (C7 intravertebral body cyst); Chronic cervical radicular pain (Right); Chronic lumbar radicular pain (Bilateral) (L>R) (L5 Dermatome); Osteoarthritis; Chronic hip pain; Chronic knee pain (Bilateral) (R>L); Complex regional pain syndrome type II of upper extremity (Right); Chronic upper extremity pain (Right); 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); Epidural fibrosis; Musculoskeletal pain;  Neurogenic pain; Neuropathic pain; Chronic sacroiliac joint pain (Bilateral) (R>L); Psoriatic arthritis ([place]); Controlled type 2 diabetes mellitus without complication ([place]); Chest pain; Acquired hypothyroidism; Chronic lower extremity pain (Secondary source of pain) (Bilateral) (L>R); Psoriasis with arthropathy ([place]); Hypothyroidism; Numbness and tingling of both legs; Abnormal nerve conduction studies (06/20/16); Chronic pain syndrome; Hypotestosteronism; Nausea without vomiting; Acute postoperative pain; Perineal pain; Acquired cyst of kidney; Urinary urgency; Asthma; Chest pain with high risk for cardiac etiology; and Chronic upper extremity pain(R>L) on their problem list. His primarily concern today is the Back Pain; Shoulder Pain (bilateral); Hip Pain (bilateral); and Knee Pain (bilateral)  Pain Assessment: Location: Upper, Mid, Lower Back Radiating: shoulders, hips, knees Onset: More than a month ago Duration: Chronic pain Quality: Throbbing, Shooting, Stabbing, Constant Severity: 4 /10 (subjective, self-reported pain score)  Note: Reported level is compatible with observation.                          Effect on ADL:   Timing: Constant Modifying factors: rest, medications BP: (!) 141/89  HR: 66  [name] was last scheduled for an appointment on 05/21/2018 for medication management. During today's appointment we reviewed [name] chronic pain status, as well as his outpatient medication regimen. He admits that he has that pain is specific areas is getting worse in the neck. He feels like it is a nerve impingement. He feels like this caused increased issues in other areas.  He is having numbness and tingling bilaterally.  He admits that it affects fingers 1, 2 and 3 with the right being greater than the left.  He admits that he is being seen at [facility].  He admits that this has made him have to go to NSAIDs. He admits that this then causes gastritis.  He denies specific  multi-joint pain.  He does admits that the NSAIDs helped him to get up in  the morning. He admits that the Fentanyl is not effective. He has discontinued using this approximately 2 months ago.  He admits that he does continue to use the oxycodone 20 mg 3 times daily.  He admits that he does drug holidays for 1 week/month.  He wanting to know if there is anything additional that he can use for his pain.  He is not interested in any interventional therapy at this time. He admits that he does have a lot of additional health concerns and is been followed by specialist.  He is waiting to get into [facility] for sarcoidosis specialist.  He admits that he has lost 50 pounds.  He states that there is an area on his skin that is concerning.  The patient  reports that he does not use drugs. His body mass index is 36.62 kg/m.  Further details on both, my assessment(s), as well as the proposed treatment plan, please see below.  Controlled Substance Pharmacotherapy Assessment REMS (Risk Evaluation and Mitigation Strategy)  Analgesic:Fentanyl patch 12.5 mcg/h every 72 hours + oxycodone IR 20 mg every 8 hours (60 mg/day) MME/day:120 mg/day  Hart Rochester, RN  08/21/2018  8:27 AM  Sign at close encounter Nursing Pain Medication Assessment:  Safety precautions to be maintained throughout the outpatient stay will include: orient to surroundings, keep bed in low position, maintain call bell within reach at all times, provide assistance with transfer out of bed and ambulation.  Medication Inspection Compliance: Pill count conducted under aseptic conditions, in front of the patient. Neither the pills nor the bottle was removed from the patient's sight at any time. Once count was completed pills were immediately returned to the patient in their original bottle.  Medication: Oxycodone IR Pill/Patch Count: 39 of 90 pills remain Pill/Patch Appearance: Markings consistent with prescribed medication Bottle Appearance:  Standard pharmacy container. Clearly labeled. Filled Date: 09 / 30 / 2019 Last Medication intake:  Today   Pharmacokinetics: Liberation and absorption (onset of action): WNL Distribution (time to peak effect): WNL Metabolism and excretion (duration of action): WNL         Pharmacodynamics: Desired effects: Analgesia: Mr. Gales reports >50% benefit. Functional ability: Patient reports that medication allows him to accomplish basic ADLs Clinically meaningful improvement in function (CMIF): Medication does not meet basic CMIF Perceived effectiveness: Described as ineffective and would like to make some changes requesting additional Ultram Undesirable effects: Side-effects or Adverse reactions: None reported Monitoring: [place] PMP: Online review of the past 65-monthperiod conducted. Compliant with practice rules and regulations Last UDS on record: Summary  Date Value Ref Range Status  05/21/2018 FINAL  Final    Comment:    ==================================================================== TOXASSURE SELECT 13 (MW) ==================================================================== Test                             Result       Flag       Units Drug Present and Declared for Prescription Verification   Oxycodone                      1957         EXPECTED   ng/mg creat   Oxymorphone                    802          EXPECTED   ng/mg creat   Noroxycodone                   >  3311        EXPECTED   ng/mg creat   Noroxymorphone                 733          EXPECTED   ng/mg creat    Sources of oxycodone are scheduled prescription medications.    Oxymorphone, noroxycodone, and noroxymorphone are expected    metabolites of oxycodone. Oxymorphone is also available as a    scheduled prescription medication.   Fentanyl                       4            EXPECTED   ng/mg creat   Norfentanyl                    43           EXPECTED   ng/mg creat    Source of fentanyl is a scheduled prescription  medication,    including IV, patch, and transmucosal formulations. Norfentanyl    is an expected metabolite of fentanyl. ==================================================================== Test                      Result    Flag   Units      Ref Range   Creatinine              302              mg/dL      >=20 ==================================================================== Declared Medications:  The flagging and interpretation on this report are based on the  following declared medications.  Unexpected results may arise from  inaccuracies in the declared medications.  **Note: The testing scope of this panel includes these medications:  Fentanyl  Oxycodone  **Note: The testing scope of this panel does not include following  reported medications:  Albuterol (Proventil)  Azelastine  Folic acid  Loratadine  Methotrexate  Metoprolol  Naloxone  Promethazine  Sucralfate  Tizanidine  Vitamin B  Vitamin D ==================================================================== For clinical consultation, please call [phone]. ====================================================================    UDS interpretation: Compliant          Medication Assessment Form: Reviewed. Patient indicates being compliant with therapy Treatment compliance: Compliant Risk Assessment Profile: Aberrant behavior: See prior evaluations. None observed or detected today Comorbid factors increasing risk of overdose: See prior notes. No additional risks detected today Opioid risk tool (ORT) (Total Score): 0 Personal History of Substance Abuse (SUD-Substance use disorder):  Alcohol: Negative  Illegal Drugs: Negative  Rx Drugs: Negative  ORT Risk Level calculation: Low Risk Risk of substance use disorder (SUD): Low Opioid Risk Tool - 08/21/18 0828      Family History of Substance Abuse   Alcohol  Negative    Illegal Drugs  Negative    Rx Drugs  Negative      Personal History of Substance Abuse    Alcohol  Negative    Illegal Drugs  Negative    Rx Drugs  Negative      Age   Age between 74-45 years   No      History of Preadolescent Sexual Abuse   History of Preadolescent Sexual Abuse  Negative or Male      Psychological Disease   Psychological Disease  Negative    Depression  Negative      Total Score   Opioid Risk Tool Scoring  0  Opioid Risk Interpretation  Low Risk      ORT Scoring interpretation table:  Score <3 = Low Risk for SUD  Score between 4-7 = Moderate Risk for SUD  Score >8 = High Risk for Opioid Abuse   Risk Mitigation Strategies:  Patient Counseling: Covered Patient-Prescriber Agreement (PPA): Present and active  Notification to other healthcare providers: Done  Pharmacologic Plan: No change in therapy, at this time.           Fentanyl discontinued no additional opioids added Laboratory Chemistry  Inflammation Markers (CRP: Acute Phase) (ESR: Chronic Phase) Lab Results  Component Value Date   CRP <0.80 12/06/2016   ESRSEDRATE 9 12/06/2016                         Rheumatology Markers No results found for: RF, ANA, LABURIC, URICUR, LYMEIGGIGMAB, LYMEABIGMQN, HLAB27                      Renal Function Markers Lab Results  Component Value Date   BUN 14 12/06/2016   CREATININE 1.20 01/31/2018   GFRAA >60 12/06/2016   GFRNONAA >60 12/06/2016                             Hepatic Function Markers Lab Results  Component Value Date   AST 35 12/06/2016   ALT 33 12/06/2016   ALBUMIN 4.4 12/06/2016   ALKPHOS 47 12/06/2016                        Electrolytes Lab Results  Component Value Date   NA 139 12/06/2016   K 4.6 12/06/2016   CL 103 12/06/2016   CALCIUM 9.6 12/06/2016   MG 2.2 12/06/2016                        Neuropathy Markers Lab Results  Component Value Date   VITAMINB12 423 12/06/2016   HGBA1C 5.2 12/12/2017                        CNS Tests No results found for: COLORCSF, APPEARCSF, RBCCOUNTCSF, WBCCSF, POLYSCSF,  LYMPHSCSF, EOSCSF, PROTEINCSF, GLUCCSF, JCVIRUS, CSFOLI, IGGCSF                      Bone Pathology Markers Lab Results  Component Value Date   25OHVITD1 42 12/06/2016   25OHVITD2 <1.0 12/06/2016   25OHVITD3 42 12/06/2016   TESTOSTERONE 285 02/12/2018                         Coagulation Parameters No results found for: INR, LABPROT, APTT, PLT, DDIMER                      Cardiovascular Markers No results found for: BNP, CKTOTAL, CKMB, TROPONINI, HGB, HCT                       CA Markers No results found for: CEA, CA125, LABCA2                      Note: Lab results reviewed.  Recent Diagnostic Imaging Results  DG Sniff Test CLINICAL DATA:  Increasing shortness of breath over the past several years. Found to have elevated right hemidiaphragm on  previous CT scan. History of shrapnel wounds.  EXAM: CHEST FLUOROSCOPY  TECHNIQUE: Real-time fluoroscopic evaluation of the chest was performed.  FLUOROSCOPY TIME:  Fluoroscopy Time:  6 seconds  Radiation Exposure Index (if provided by the fluoroscopic device): 41.5 mGy  Number of Acquired Spot Images: 3 video loops  COMPARISON:  CT scan of the chest of January 24, 2017  FINDINGS: There is normal respiratory motion of the left hemidiaphragm. The mildly elevated right hemidiaphragm reveals paradoxical motion during deep inspiration and during the sniff maneuver.  IMPRESSION: Findings compatible with right hemidiaphragm paresis. No purposeful motion of the right hemidiaphragm is observed.  Electronically Signed   By: David  Martinique M.D.   On: 02/11/2018 09:10  Complexity Note: Imaging results reviewed. Results shared with Mr. Hyndman, using Layman's terms.                         Meds   Current Outpatient Medications:  .  albuterol (PROAIR HFA) 108 (90 BASE) MCG/ACT inhaler, Inhale 2 puffs into the lungs every 4 (four) hours as needed for wheezing or shortness of breath. , Disp: , Rfl:  .  azelastine (ASTELIN) 0.1 %  nasal spray, Place 1 spray into both nostrils 2 (two) times daily. , Disp: , Rfl:  .  folic acid (FOLVITE) 1 MG tablet, Take 2 mg by mouth 2 (two) times daily. , Disp: , Rfl:  .  loratadine (CLARITIN) 10 MG tablet, Take 10 mg by mouth daily., Disp: , Rfl:  .  methotrexate 50 MG/2ML injection, Inject 50 mg/m2 as directed once a week. , Disp: , Rfl:  .  metoprolol (LOPRESSOR) 100 MG tablet, TAKE 1 TABLET BY MOUTH TWICE A DAY, Disp: , Rfl:  .  naloxone (NARCAN) nasal spray 4 mg/0.1 mL, Spray into one nostril. Repeat with second device into other nostril after 2-3 minutes if no or minimal response. Use in case of opioid overdose., Disp: 1 kit, Rfl: 0 .  [START ON 10/30/2018] Oxycodone HCl 20 MG TABS, Take 1 tablet (20 mg total) by mouth every 8 (eight) hours., Disp: 90 tablet, Rfl: 0 .  tiZANidine (ZANAFLEX) 4 MG capsule, Take 4 mg by mouth 3 (three) times daily as needed for muscle spasms., Disp: , Rfl:  .  fentaNYL (DURAGESIC - DOSED MCG/HR) 12 MCG/HR, Place 1 patch (12.5 mcg total) onto the skin every 3 (three) days., Disp: 10 patch, Rfl: 0 .  fentaNYL (DURAGESIC - DOSED MCG/HR) 12 MCG/HR, Place 1 patch (12.5 mcg total) onto the skin every 3 (three) days., Disp: 10 patch, Rfl: 0 .  [START ON 09/30/2018] Oxycodone HCl 20 MG TABS, Take 1 tablet (20 mg total) by mouth every 8 (eight) hours as needed., Disp: 90 tablet, Rfl: 0 .  [START ON 08/31/2018] Oxycodone HCl 20 MG TABS, Take 1 tablet (20 mg total) by mouth every 8 (eight) hours as needed., Disp: 90 tablet, Rfl: 0  ROS  Constitutional: Denies any fever or chills Gastrointestinal: No reported hemesis, hematochezia, vomiting, or acute GI distress Musculoskeletal: Denies any acute onset joint swelling, redness, loss of ROM, or weakness Neurological: No reported episodes of acute onset apraxia, aphasia, dysarthria, agnosia, amnesia, paralysis, loss of coordination, or loss of consciousness  Allergies  Mr. Fails is allergic to penicillin g;  penicillins; lactose; nsaids; procaine; sulfasalazine; talwin [pentazocine]; codeine; and sulfa antibiotics.  Assumption  Drug: Mr. Massmann  reports that he does not use drugs. Alcohol:  reports that he does not drink  alcohol. Tobacco:  reports that he has quit smoking. His smoking use included cigarettes. His smokeless tobacco use includes chew. 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 Surgical: Mr. Ray  has a past surgical history that includes Spine surgery; Eye surgery; Fracture surgery (Right); Fracture surgery (Bilateral); Fracture surgery (Bilateral); Joint replacement (Right); Spinal cord stimulator implant; Spinal cord stimulator removal; Patella arthroplasty; Shoulder arthroscopy (Bilateral); Esophagogastroduodenoscopy (egd) with propofol (N/A, 01/20/2016); and Colonoscopy (2009). Family: family history includes Arthritis in his father and mother; COPD in his father; Cancer in his father and mother; Diabetes in his mother; Hematuria in his father and mother; Hypertension in his father and mother; Kidney disease in his mother; Kidney failure in his mother; Prostate cancer in his father; Stroke in his mother.  Constitutional Exam  General appearance: alert, cooperative and in no distress Vitals:   08/21/18 0817  BP: (!) 141/89  Pulse: 66  Temp: (!) 97.5 F (36.4 C)  TempSrc: Oral  SpO2: 100%  Weight: 270 lb (122.5 kg)  Height: 6' (1.829 m)  Psych/Mental  status: Alert, oriented x 3 (person, place, & time)       Eyes: PERLA Respiratory: No evidence of acute respiratory distress  Cervical Spine Area Exam  Skin & Axial Inspection: No masses, redness, edema, swelling, or associated skin lesions Alignment: Symmetrical Functional ROM: Adequate ROM      Stability: No instability detected Muscle Tone/Strength: Functionally intact. No obvious neuro-muscular anomalies detected. Sensory (Neurological): Dermatomal pain pattern Palpation: Tender              Upper Extremity (UE) Exam    Side: Right upper extremity  Side: Left upper extremity  Skin & Extremity Inspection: Skin color, temperature, and hair growth are WNL. No peripheral edema or cyanosis. No masses, redness, swelling, asymmetry, or associated skin lesions. No contractures.  Skin & Extremity Inspection: Skin color, temperature, and hair growth are WNL. No peripheral edema or cyanosis. No masses, redness, swelling, asymmetry, or associated skin lesions. No contractures.  Functional ROM: Unrestricted ROM          Functional ROM: Unrestricted ROM          Muscle Tone/Strength: Functionally intact. No obvious neuro-muscular anomalies detected.  Muscle Tone/Strength: Functionally intact. No obvious neuro-muscular anomalies detected.  Sensory (Neurological): Unimpaired          Sensory (Neurological): Unimpaired          Palpation: No palpable anomalies              Palpation: No palpable anomalies              Provocative Test(s):  Phalen's test: deferred Tinel's test: deferred Apley's scratch test (touch opposite shoulder):  Action 1 (Across chest): deferred Action 2 (Overhead): deferred Action 3 (LB reach): deferred   Provocative Test(s):  Phalen's test: deferred Tinel's test: deferred Apley's scratch test (touch opposite shoulder):  Action 1 (Across chest): deferred Action 2 (Overhead): deferred Action 3 (LB reach): deferred    Thoracic Spine Area Exam  Skin & Axial Inspection: No  masses, redness, or swelling Alignment: Symmetrical Functional ROM: Unrestricted ROM Stability: No instability detected  Muscle Tone/Strength: Functionally intact. No obvious neuro-muscular anomalies detected. Sensory (Neurological): Unimpaired Muscle strength & Tone: No palpable anomalies  Lumbar Spine Area Exam  Skin & Axial Inspection: No masses, redness, or swelling Alignment: Symmetrical Functional ROM: Unrestricted ROM       Stability: No instability detected Muscle Tone/Strength: Functionally intact. No obvious neuro-muscular anomalies detected. Sensory (Neurological): Unimpaired Palpation: No palpable anomalies       Provocative Tests: Hyperextension/rotation test: deferred today       Lumbar quadrant test (Kemp's test): deferred today       Lateral bending test: deferred today       Patrick's Maneuver: deferred today                   FABER test: deferred today                   S-I anterior distraction/compression test: deferred today         S-I lateral compression test: deferred today         S-I Thigh-thrust test: deferred today         S-I Gaenslen's test: deferred today          Gait & Posture Assessment  Ambulation: Unassisted Gait: Relatively normal for age and body habitus Posture: WNL   Lower Extremity Exam    Side: Right lower extremity  Side: Left lower extremity  Stability: No instability observed          Stability: No instability observed          Skin & Extremity Inspection: Skin color, temperature, and hair growth are WNL. No peripheral edema or cyanosis. No masses, redness, swelling, asymmetry, or associated skin lesions. No contractures.  Skin & Extremity Inspection: Skin color, temperature, and hair growth are WNL. No peripheral edema or cyanosis. No masses, redness, swelling, asymmetry, or associated skin lesions. No contractures.  Functional ROM: Unrestricted ROM                  Functional ROM: Unrestricted ROM                  Muscle  Tone/Strength: Functionally intact. No obvious neuro-muscular anomalies detected.  Muscle Tone/Strength: Functionally intact. No obvious neuro-muscular anomalies detected.  Sensory (Neurological): Unimpaired  Sensory (Neurological): Unimpaired  Palpation: No palpable anomalies  Palpation: No palpable anomalies   Assessment  Primary Diagnosis & Pertinent Problem List: The primary encounter diagnosis was Cervical spondylosis (C7 intravertebral body cyst). Diagnoses of Lumbar spondylosis, Chronic pain of both upper extremities, Chronic pain syndrome, Cervicalgia, and Neuropathic pain were also pertinent to this visit.  Status Diagnosis  Worsening Persistent Worsening 1. Cervical spondylosis (C7 intravertebral body cyst)   2. Lumbar spondylosis   3. Chronic pain of both upper extremities   4. Chronic pain syndrome   5. Cervicalgia   6. Neuropathic pain     Problems updated and reviewed during this visit: Problem  Chronic upper extremity pain(R>L)  Chest Pain With High Risk for Cardiac Etiology   Plan of Care  Pharmacotherapy (Medications Ordered): Meds ordered this encounter  Medications  . Oxycodone HCl 20 MG TABS    Sig: Take 1 tablet (20 mg total) by mouth every 8 (eight) hours.    Dispense:  90 tablet    Refill:  0    Do not place this medication, or any other prescription from our practice, on "Automatic Refill". Patient may have prescription filled one day early if pharmacy is closed  on scheduled refill date.    Order Specific Question:   Supervising Provider    Answer:   Milinda Pointer [phone]  . Oxycodone HCl 20 MG TABS    Sig: Take 1 tablet (20 mg total) by mouth every 8 (eight) hours as needed.    Dispense:  90 tablet    Refill:  0    Do not place this medication, or any other prescription from our practice, on "Automatic Refill". Patient may have prescription filled one day early if pharmacy is closed on scheduled refill date.    Order Specific Question:    Supervising Provider    Answer:   Milinda Pointer [phone]  . Oxycodone HCl 20 MG TABS    Sig: Take 1 tablet (20 mg total) by mouth every 8 (eight) hours as needed.    Dispense:  90 tablet    Refill:  0    Do not place this medication, or any other prescription from our practice, on "Automatic Refill". Patient may have prescription filled one day early if pharmacy is closed on scheduled refill date.    Order Specific Question:   Supervising Provider    Answer:   Milinda Pointer [[id number]]   New Prescriptions   No medications on file   Medications administered today: Gilford Rile. Ponder had no medications administered during this visit. Lab-work, procedure(s), and/or referral(s): Orders Placed This Encounter  Procedures  . MR CERVICAL SPINE WO CONTRAST   Imaging and/or referral(s): MR CERVICAL SPINE WO CONTRAST  Interventional therapies: Planned, scheduled, and/or pending: Not at this time.  Encourage patient to perform drug holiday according to the directions and it has to be a 2-week.   Considering:  Diagnostic Right-sided cervical epiduralsteroid injection.  Diagnostic Right-sided L4-5 lumbar epiduralsteroid injection.  Diagnostic bilateral lumbar facetblock. Possible bilateral lumbar facet radiofrequencyablation. Diagnostic bilateral cervical facetblock.  Possible bilateral cervical facet radiofrequencyablation.   Palliative PRN treatment(s):  For the arm pain and numbness, right-sided cervical epidural steroid injection For the lower extremity pain, right-sided L4-5 lumbar epidural steroid injection For the low back pain, diagnostic bilateral lumbar facet block For the neck pain, diagnostic bilateral cervical facet block     Provider-requested follow-up: Return in about 3 months (around 11/21/2018) for MedMgmt, MRI.  No future appointments. Primary Care Physician: [name], MD Location: [facility] Note by:  Vevelyn Francois NP Date: 08/21/2018; Time: 2:51 PM  Pain Score Disclaimer: We use the NRS-11 scale. This is a self-reported, subjective measurement of pain severity with only modest accuracy. It is used primarily to identify changes within a particular patient. It must be understood that outpatient pain scales are significantly less accurate that those used for research, where they can be applied under ideal controlled circumstances with minimal exposure to variables. In reality, the score is likely to be a combination of pain intensity and pain affect, where pain affect describes the degree of emotional arousal or changes in action readiness caused by the sensory experience of pain. Factors such as social and work situation, setting, emotional state, anxiety levels, expectation, and prior pain experience may influence pain perception and show large inter-individual differences that may also be affected by time variables.  Patient instructions provided during this appointment: Patient Instructions   ____________________________________________________________________________________________  Medication Rules  Applies to: All patients receiving prescriptions (written or electronic).  Pharmacy of record: Pharmacy where electronic prescriptions will be sent. If written prescriptions are taken to a different pharmacy, please inform the nursing staff. The  pharmacy listed in the electronic medical record should be the one where you would like electronic prescriptions to be sent.  Prescription refills: Only during scheduled appointments. Applies to both, written and electronic prescriptions.  NOTE: The following applies primarily to controlled substances (Opioid* Pain Medications).   Patient's responsibilities: 1. Pain Pills: Bring all pain pills to every appointment (except for procedure appointments). 2. Pill Bottles: Bring pills in original pharmacy bottle. Always bring newest bottle. Bring  bottle, even if empty. 3. Medication refills: You are responsible for knowing and keeping track of what medications you need refilled. The day before your appointment, write a list of all prescriptions that need to be refilled. Bring that list to your appointment and give it to the admitting nurse. Prescriptions will be written only during appointments. If you forget a medication, it will not be "Called in", "Faxed", or "electronically sent". You will need to get another appointment to get these prescribed. 4. Prescription Accuracy: You are responsible for carefully inspecting your prescriptions before leaving our office. Have the discharge nurse carefully go over each prescription with you, before taking them home. Make sure that your name is accurately spelled, that your address is correct. Check the name and dose of your medication to make sure it is accurate. Check the number of pills, and the written instructions to make sure they are clear and accurate. Make sure that you are given enough medication to last until your next medication refill appointment. 5. Taking Medication: Take medication as prescribed. Never take more pills than instructed. Never take medication more frequently than prescribed. Taking less pills or less frequently is permitted and encouraged, when it comes to controlled substances (written prescriptions).  6. Inform other Doctors: Always inform, all of your healthcare providers, of all the medications you take. 7. Pain Medication from other Providers: You are not allowed to accept any additional pain medication from any other Doctor or Healthcare provider. There are two exceptions to this rule. (see below) In the event that you require additional pain medication, you are responsible for notifying us, as stated below. 8. Medication Agreement: You are responsible for carefully reading and following our Medication Agreement. This must be signed before receiving any prescriptions from our  practice. Safely store a copy of your signed Agreement. Violations to the Agreement will result in no further prescriptions. (Additional copies of our Medication Agreement are available upon request.) 9. Laws, Rules, & Regulations: All patients are expected to follow all Federal and [employer], [employer], Rules, [employer]. Ignorance of the Laws does not constitute a valid excuse. The use of any illegal substances is prohibited. 10. Adopted CDC guidelines & recommendations: Target dosing levels will be at or below 60 MME/day. Use of benzodiazepines** is not recommended.  Exceptions: There are only two exceptions to the rule of not receiving pain medications from other Healthcare Providers. 1. Exception #1 (Emergencies): In the event of an emergency (i.e.: accident requiring emergency care), you are allowed to receive additional pain medication. However, you are responsible for: As soon as you are able, call our office [phone], at any time of the day or night, and leave a message stating your name, the date and nature of the emergency, and the name and dose of the medication prescribed. In the event that your call is answered by a member of our staff, make sure to document and save the date, time, and the name of the person that took your information.  2. Exception #2 (Planned Surgery):  In the event that you are scheduled by another doctor or dentist to have any type of surgery or procedure, you are allowed (for a period no longer than 30 days), to receive additional pain medication, for the acute post-op pain. However, in this case, you are responsible for picking up a copy of our "Post-op Pain Management for Surgeons" handout, and giving it to your surgeon or dentist. This document is available at our office, and does not require an appointment to obtain it. Simply go to our office during business hours (Monday-Thursday from 8:00 AM to 4:00 PM) (Friday 8:00 AM to 12:00 Noon) or if you have a  scheduled appointment with [place], prior to your surgery, and ask for it by name. In addition, you will need to provide [place] with your name, name of your surgeon, type of surgery, and date of procedure or surgery.  *Opioid medications include: morphine, codeine, oxycodone, oxymorphone, hydrocodone, hydromorphone, meperidine, tramadol, tapentadol, buprenorphine, fentanyl, methadone. **Benzodiazepine medications include: diazepam (Valium), alprazolam (Xanax), clonazepam (Klonopine), lorazepam (Ativan), clorazepate (Tranxene), chlordiazepoxide (Librium), estazolam (Prosom), oxazepam (Serax), temazepam (Restoril), triazolam (Halcion) (Last updated: 01/02/2018) ____________________________________________________________________________________________    BMI Assessment: Estimated body mass index is 36.62 kg/m as calculated from the following:   Height as of this encounter: 6' (1.829 m).   Weight as of this encounter: 270 lb (122.5 kg).  BMI interpretation table: BMI level Category Range association with higher incidence of chronic pain  <18 kg/m2 Underweight   18.5-24.9 kg/m2 Ideal body weight   25-29.9 kg/m2 Overweight Increased incidence by 20%  30-34.9 kg/m2 Obese (Class I) Increased incidence by 68%  35-39.9 kg/m2 Severe obesity (Class II) Increased incidence by 136%  >40 kg/m2 Extreme obesity (Class III) Increased incidence by 254%   Patient's current BMI Ideal Body weight  Body mass index is 36.62 kg/m. Ideal body weight: 77.6 kg (171 lb 1.2 oz) Adjusted ideal body weight: 95.5 kg (210 lb 10.3 oz)   BMI Readings from Last 4 Encounters:  08/21/18 36.62 kg/m  05/21/18 35.26 kg/m  02/19/18 36.62 kg/m  02/12/18 37.35 kg/m   Wt Readings from Last 4 Encounters:  08/21/18 270 lb (122.5 kg)  05/21/18 260 lb (117.9 kg)  02/19/18 270 lb (122.5 kg)  02/12/18 267 lb 12.8 oz (121.5 kg)   ____________________________________________________________________________________________  Drug  Holidays (Slow)  What is a "Drug Holiday"? Drug Holiday: is the name given to the period of time during which a patient stops taking a medication(s) for the purpose of eliminating tolerance to the drug.  Benefits . Improved effectiveness of opioids. . Decreased opioid dose needed to achieve benefits. . Improved pain with lesser dose.  What is tolerance? Tolerance: is the progressive decreased in effectiveness of a drug due to its repetitive use. With repetitive use, the body gets use to the medication and as a consequence, it loses its effectiveness. This is a common problem seen with opioid pain medications. As a result, a larger dose of the drug is needed to achieve the same effect that used to be obtained with a smaller dose.  How long should a "Drug Holiday" last? At least 14 consecutive days. (2 weeks)  What are withdrawals? Withdrawals: refers to the wide range of symptoms that occur after stopping or dramatically reducing opiate drugs after heavy and prolonged use. Withdrawal symptoms do not occur to patients that use low dose opioids, or those who take the medication sporadically. Contrary to benzodiazepine (example: Valium, Xanax, etc.) or alcohol withdrawals ("Delirium Tremens"), opioid withdrawals are  not lethal. Withdrawals are the physical manifestation of the body getting rid of the excess receptors.  Expected Symptoms Early symptoms of withdrawal may include: . Agitation . Anxiety . Muscle aches . Increased tearing . Insomnia . Runny nose . Sweating . Yawning  Late symptoms of withdrawal may include: . Abdominal cramping . Diarrhea . Dilated pupils . Goose bumps . Nausea . Vomiting  Will I experience withdrawals? Due to the slow nature of the taper, it is very unlikely that you will experience any.  What is a slow taper? Taper: refers to the gradual decrease in dose.  ___________________________________________________________________________________________ Scripts X 3 e-scribed to patients pharmacy of choice.

## 2018-08-21 NOTE — Patient Instructions (Addendum)
____________________________________________________________________________________________  Medication Rules  Applies to: All patients receiving prescriptions (written or electronic).  Pharmacy of record: Pharmacy where electronic prescriptions will be sent. If written prescriptions are taken to a different pharmacy, please inform the nursing staff. The pharmacy listed in the electronic medical record should be the one where you would like electronic prescriptions to be sent.  Prescription refills: Only during scheduled appointments. Applies to both, written and electronic prescriptions.  NOTE: The following applies primarily to controlled substances (Opioid* Pain Medications).   Patient's responsibilities: 1. Pain Pills: Bring all pain pills to every appointment (except for procedure appointments). 2. Pill Bottles: Bring pills in original pharmacy bottle. Always bring newest bottle. Bring bottle, even if empty. 3. Medication refills: You are responsible for knowing and keeping track of what medications you need refilled. The day before your appointment, write a list of all prescriptions that need to be refilled. Bring that list to your appointment and give it to the admitting nurse. Prescriptions will be written only during appointments. If you forget a medication, it will not be "Called in", "Faxed", or "electronically sent". You will need to get another appointment to get these prescribed. 4. Prescription Accuracy: You are responsible for carefully inspecting your prescriptions before leaving our office. Have the discharge nurse carefully go over each prescription with you, before taking them home. Make sure that your name is accurately spelled, that your address is correct. Check the name and dose of your medication to make sure it is accurate. Check the number of pills, and the written instructions to make sure they are clear and accurate. Make sure that you are given enough medication to last  until your next medication refill appointment. 5. Taking Medication: Take medication as prescribed. Never take more pills than instructed. Never take medication more frequently than prescribed. Taking less pills or less frequently is permitted and encouraged, when it comes to controlled substances (written prescriptions).  6. Inform other Doctors: Always inform, all of your healthcare providers, of all the medications you take. 7. Pain Medication from other Providers: You are not allowed to accept any additional pain medication from any other Doctor or Healthcare provider. There are two exceptions to this rule. (see below) In the event that you require additional pain medication, you are responsible for notifying us, as stated below. 8. Medication Agreement: You are responsible for carefully reading and following our Medication Agreement. This must be signed before receiving any prescriptions from our practice. Safely store a copy of your signed Agreement. Violations to the Agreement will result in no further prescriptions. (Additional copies of our Medication Agreement are available upon request.) 9. Laws, Rules, & Regulations: All patients are expected to follow all Federal and State Laws, Statutes, Rules, & Regulations. Ignorance of the Laws does not constitute a valid excuse. The use of any illegal substances is prohibited. 10. Adopted CDC guidelines & recommendations: Target dosing levels will be at or below 60 MME/day. Use of benzodiazepines** is not recommended.  Exceptions: There are only two exceptions to the rule of not receiving pain medications from other Healthcare Providers. 1. Exception #1 (Emergencies): In the event of an emergency (i.e.: accident requiring emergency care), you are allowed to receive additional pain medication. However, you are responsible for: As soon as you are able, call our office [phone], at any time of the day or night, and leave a message stating your name, the  date and nature of the emergency, and the name and dose of the medication   prescribed. In the event that your call is answered by a member of our staff, make sure to document and save the date, time, and the name of the person that took your information.  2. Exception #2 (Planned Surgery): In the event that you are scheduled by another doctor or dentist to have any type of surgery or procedure, you are allowed (for a period no longer than 30 days), to receive additional pain medication, for the acute post-op pain. However, in this case, you are responsible for picking up a copy of our "Post-op Pain Management for Surgeons" handout, and giving it to your surgeon or dentist. This document is available at our office, and does not require an appointment to obtain it. Simply go to our office during business hours (Monday-Thursday from 8:00 AM to 4:00 PM) (Friday 8:00 AM to 12:00 Noon) or if you have a scheduled appointment with [place], prior to your surgery, and ask for it by name. In addition, you will need to provide [place] with your name, name of your surgeon, type of surgery, and date of procedure or surgery.  *Opioid medications include: morphine, codeine, oxycodone, oxymorphone, hydrocodone, hydromorphone, meperidine, tramadol, tapentadol, buprenorphine, fentanyl, methadone. **Benzodiazepine medications include: diazepam (Valium), alprazolam (Xanax), clonazepam (Klonopine), lorazepam (Ativan), clorazepate (Tranxene), chlordiazepoxide (Librium), estazolam (Prosom), oxazepam (Serax), temazepam (Restoril), triazolam (Halcion) (Last updated: 01/02/2018) ____________________________________________________________________________________________    BMI Assessment: Estimated body mass index is 36.62 kg/m as calculated from the following:   Height as of this encounter: 6' (1.829 m).   Weight as of this encounter: 270 lb (122.5 kg).  BMI interpretation table: BMI level Category Range association with higher incidence  of chronic pain  <18 kg/m2 Underweight   18.5-24.9 kg/m2 Ideal body weight   25-29.9 kg/m2 Overweight Increased incidence by 20%  30-34.9 kg/m2 Obese (Class I) Increased incidence by 68%  35-39.9 kg/m2 Severe obesity (Class II) Increased incidence by 136%  >40 kg/m2 Extreme obesity (Class III) Increased incidence by 254%   Patient's current BMI Ideal Body weight  Body mass index is 36.62 kg/m. Ideal body weight: 77.6 kg (171 lb 1.2 oz) Adjusted ideal body weight: 95.5 kg (210 lb 10.3 oz)   BMI Readings from Last 4 Encounters:  08/21/18 36.62 kg/m  05/21/18 35.26 kg/m  02/19/18 36.62 kg/m  02/12/18 37.35 kg/m   Wt Readings from Last 4 Encounters:  08/21/18 270 lb (122.5 kg)  05/21/18 260 lb (117.9 kg)  02/19/18 270 lb (122.5 kg)  02/12/18 267 lb 12.8 oz (121.5 kg)   ____________________________________________________________________________________________  Drug Holidays (Slow)  What is a "Drug Holiday"? Drug Holiday: is the name given to the period of time during which a patient stops taking a medication(s) for the purpose of eliminating tolerance to the drug.  Benefits . Improved effectiveness of opioids. . Decreased opioid dose needed to achieve benefits. . Improved pain with lesser dose.  What is tolerance? Tolerance: is the progressive decreased in effectiveness of a drug due to its repetitive use. With repetitive use, the body gets use to the medication and as a consequence, it loses its effectiveness. This is a common problem seen with opioid pain medications. As a result, a larger dose of the drug is needed to achieve the same effect that used to be obtained with a smaller dose.  How long should a "Drug Holiday" last? At least 14 consecutive days. (2 weeks)  What are withdrawals? Withdrawals: refers to the wide range of symptoms that occur after stopping or dramatically reducing opiate drugs  after heavy and prolonged use. Withdrawal symptoms do not occur to  patients that use low dose opioids, or those who take the medication sporadically. Contrary to benzodiazepine (example: Valium, Xanax, etc.) or alcohol withdrawals ("Delirium Tremens"), opioid withdrawals are not lethal. Withdrawals are the physical manifestation of the body getting rid of the excess receptors.  Expected Symptoms Early symptoms of withdrawal may include: . Agitation . Anxiety . Muscle aches . Increased tearing . Insomnia . Runny nose . Sweating . Yawning  Late symptoms of withdrawal may include: . Abdominal cramping . Diarrhea . Dilated pupils . Goose bumps . Nausea . Vomiting  Will I experience withdrawals? Due to the slow nature of the taper, it is very unlikely that you will experience any.  What is a slow taper? Taper: refers to the gradual decrease in dose. ___________________________________________________________________________________________ Scripts X 3 e-scribed to patients pharmacy of choice.

## 2018-09-02 DIAGNOSIS — I10 Essential (primary) hypertension: Secondary | ICD-10-CM | POA: Diagnosis not present

## 2018-09-02 DIAGNOSIS — R079 Chest pain, unspecified: Secondary | ICD-10-CM | POA: Diagnosis not present

## 2018-09-02 DIAGNOSIS — G4730 Sleep apnea, unspecified: Secondary | ICD-10-CM | POA: Diagnosis not present

## 2018-09-02 DIAGNOSIS — R931 Abnormal findings on diagnostic imaging of heart and coronary circulation: Secondary | ICD-10-CM | POA: Diagnosis not present

## 2018-09-02 DIAGNOSIS — E119 Type 2 diabetes mellitus without complications: Secondary | ICD-10-CM | POA: Diagnosis not present

## 2018-09-02 DIAGNOSIS — I480 Paroxysmal atrial fibrillation: Secondary | ICD-10-CM | POA: Diagnosis not present

## 2018-09-02 DIAGNOSIS — I471 Supraventricular tachycardia: Secondary | ICD-10-CM | POA: Diagnosis not present

## 2018-09-04 DIAGNOSIS — L409 Psoriasis, unspecified: Secondary | ICD-10-CM | POA: Diagnosis not present

## 2018-09-04 DIAGNOSIS — Z79899 Other long term (current) drug therapy: Secondary | ICD-10-CM | POA: Diagnosis not present

## 2018-09-04 DIAGNOSIS — M1990 Unspecified osteoarthritis, unspecified site: Secondary | ICD-10-CM | POA: Diagnosis not present

## 2018-09-18 DIAGNOSIS — Z23 Encounter for immunization: Secondary | ICD-10-CM | POA: Diagnosis not present

## 2018-09-18 DIAGNOSIS — M150 Primary generalized (osteo)arthritis: Secondary | ICD-10-CM | POA: Diagnosis not present

## 2018-09-18 DIAGNOSIS — Z79899 Other long term (current) drug therapy: Secondary | ICD-10-CM | POA: Diagnosis not present

## 2018-09-18 DIAGNOSIS — L940 Localized scleroderma [morphea]: Secondary | ICD-10-CM | POA: Diagnosis not present

## 2018-09-18 DIAGNOSIS — M1990 Unspecified osteoarthritis, unspecified site: Secondary | ICD-10-CM | POA: Diagnosis not present

## 2018-09-18 DIAGNOSIS — L409 Psoriasis, unspecified: Secondary | ICD-10-CM | POA: Diagnosis not present

## 2018-09-19 ENCOUNTER — Ambulatory Visit
Admission: RE | Admit: 2018-09-19 | Discharge: 2018-09-19 | Disposition: A | Discharge status: Home / Self Care | Payer: PPO | Source: Ambulatory Visit | Attending: Nurse Practitioner | Admitting: Nurse Practitioner

## 2018-09-19 DIAGNOSIS — M4802 Spinal stenosis, cervical region: Secondary | ICD-10-CM | POA: Insufficient documentation

## 2018-09-19 DIAGNOSIS — M79601 Pain in right arm: Secondary | ICD-10-CM

## 2018-09-19 DIAGNOSIS — M47812 Spondylosis without myelopathy or radiculopathy, cervical region: Secondary | ICD-10-CM | POA: Diagnosis not present

## 2018-09-19 DIAGNOSIS — M542 Cervicalgia: Secondary | ICD-10-CM

## 2018-09-19 DIAGNOSIS — M792 Neuralgia and neuritis, unspecified: Secondary | ICD-10-CM

## 2018-09-19 DIAGNOSIS — M79602 Pain in left arm: Secondary | ICD-10-CM

## 2018-09-19 DIAGNOSIS — G8929 Other chronic pain: Secondary | ICD-10-CM

## 2018-09-23 NOTE — Progress Notes (Signed)
Results were reviewed and found to be: abnormal  No acute injury or pathology identified  Review would suggest interventional pain management techniques may be of benefit

## 2018-09-26 DIAGNOSIS — L309 Dermatitis, unspecified: Secondary | ICD-10-CM | POA: Diagnosis not present

## 2018-09-26 DIAGNOSIS — D2272 Melanocytic nevi of left lower limb, including hip: Secondary | ICD-10-CM | POA: Diagnosis not present

## 2018-09-26 DIAGNOSIS — Z08 Encounter for follow-up examination after completed treatment for malignant neoplasm: Secondary | ICD-10-CM | POA: Diagnosis not present

## 2018-09-26 DIAGNOSIS — D2271 Melanocytic nevi of right lower limb, including hip: Secondary | ICD-10-CM | POA: Diagnosis not present

## 2018-09-26 DIAGNOSIS — Z85828 Personal history of other malignant neoplasm of skin: Secondary | ICD-10-CM | POA: Diagnosis not present

## 2018-09-26 DIAGNOSIS — D225 Melanocytic nevi of trunk: Secondary | ICD-10-CM | POA: Diagnosis not present

## 2018-09-26 DIAGNOSIS — L4050 Arthropathic psoriasis, unspecified: Secondary | ICD-10-CM | POA: Diagnosis not present

## 2018-09-26 DIAGNOSIS — K219 Gastro-esophageal reflux disease without esophagitis: Secondary | ICD-10-CM | POA: Diagnosis not present

## 2018-09-26 DIAGNOSIS — I471 Supraventricular tachycardia: Secondary | ICD-10-CM | POA: Diagnosis not present

## 2018-09-26 DIAGNOSIS — D2261 Melanocytic nevi of right upper limb, including shoulder: Secondary | ICD-10-CM | POA: Diagnosis not present

## 2018-09-26 DIAGNOSIS — D2262 Melanocytic nevi of left upper limb, including shoulder: Secondary | ICD-10-CM | POA: Diagnosis not present

## 2018-09-26 DIAGNOSIS — J45909 Unspecified asthma, uncomplicated: Secondary | ICD-10-CM | POA: Diagnosis not present

## 2018-10-23 ENCOUNTER — Telehealth: Payer: Self-pay | Admitting: *Deleted

## 2018-10-23 NOTE — Telephone Encounter (Signed)
Attempted to call patient.  LEft message for patient to call us back and notified him that Dr Dossie Arbour will be out of the office for the next 2 weeks.

## 2018-10-23 NOTE — Telephone Encounter (Signed)
Has been filling scripts at [facility], but they closed. Their business has been transferred to [facility]. I called [facility] and they said the script for Oxycodone to be filled on 10-30-18 has been voided because it was transferred. Mr. Olheiser has stated he wants his scripts sent to [facility], but we need to ask which [facility]. I called him to ask, message left.

## 2018-10-24 ENCOUNTER — Telehealth: Payer: Self-pay | Admitting: Nurse Practitioner

## 2018-10-24 NOTE — Telephone Encounter (Signed)
Pt called and left a voicemail stating that he was returning Dena's call and said that he uses [facility] and the phone number is [phone]

## 2018-10-24 NOTE — Telephone Encounter (Signed)
[facility] now uses [facility] on [address].  I entered this into the computer under preferred pharmacies.  Spoke with Crystal and informed her that I would be sending this to her inbox.

## 2018-10-31 ENCOUNTER — Other Ambulatory Visit: Payer: Self-pay | Admitting: Nurse Practitioner

## 2018-10-31 DIAGNOSIS — G894 Chronic pain syndrome: Secondary | ICD-10-CM

## 2018-10-31 MED ORDER — OXYCODONE HCL 20 MG PO TABS: 20.0000 mg | ORAL_TABLET | Freq: Three times a day (TID) | ORAL | 0 refills | Status: DC

## 2018-11-10 ENCOUNTER — Other Ambulatory Visit: Payer: Self-pay

## 2018-11-10 ENCOUNTER — Encounter: Payer: Self-pay | Admitting: Nurse Practitioner

## 2018-11-10 ENCOUNTER — Ambulatory Visit: Payer: PPO | Attending: Nurse Practitioner | Admitting: Nurse Practitioner

## 2018-11-10 VITALS — BP 125/79 | HR 62 | Temp 97.7°F | Ht 71.0 in | Wt 260.0 lb

## 2018-11-10 DIAGNOSIS — M47816 Spondylosis without myelopathy or radiculopathy, lumbar region: Secondary | ICD-10-CM

## 2018-11-10 DIAGNOSIS — Z79891 Long term (current) use of opiate analgesic: Secondary | ICD-10-CM | POA: Diagnosis not present

## 2018-11-10 DIAGNOSIS — G894 Chronic pain syndrome: Secondary | ICD-10-CM | POA: Insufficient documentation

## 2018-11-10 DIAGNOSIS — M47812 Spondylosis without myelopathy or radiculopathy, cervical region: Secondary | ICD-10-CM | POA: Diagnosis not present

## 2018-11-10 DIAGNOSIS — M150 Primary generalized (osteo)arthritis: Secondary | ICD-10-CM | POA: Diagnosis not present

## 2018-11-10 DIAGNOSIS — M159 Polyosteoarthritis, unspecified: Secondary | ICD-10-CM

## 2018-11-10 MED ORDER — OXYCODONE HCL 20 MG PO TABS: 20.0000 mg | ORAL_TABLET | Freq: Three times a day (TID) | ORAL | 0 refills | Status: DC

## 2018-11-10 MED ORDER — OXYCODONE HCL 20 MG PO TABS: 20.0000 mg | ORAL_TABLET | Freq: Three times a day (TID) | ORAL | 0 refills | Status: DC | PRN

## 2018-11-10 NOTE — Patient Instructions (Addendum)
____________________________________________________________________________________________  Medication Rules  Purpose: To inform patients, and their family members, of our rules and regulations.  Applies to: All patients receiving prescriptions (written or electronic).  Pharmacy of record: Pharmacy where electronic prescriptions will be sent. If written prescriptions are taken to a different pharmacy, please inform the nursing staff. The pharmacy listed in the electronic medical record should be the one where you would like electronic prescriptions to be sent.  Electronic prescriptions: In compliance with the [place] Strengthen Opioid Misuse Prevention (STOP) Act of 2017 (Session Law 2017-74/H243), effective November 05, 2018, all controlled substances must be electronically prescribed. Calling prescriptions to the pharmacy will cease to exist.  Prescription refills: Only during scheduled appointments. Applies to all prescriptions.  NOTE: The following applies primarily to controlled substances (Opioid* Pain Medications).   Patient's responsibilities: 1. Pain Pills: Bring all pain pills to every appointment (except for procedure appointments). 2. Pill Bottles: Bring pills in original pharmacy bottle. Always bring the newest bottle. Bring bottle, even if empty. 3. Medication refills: You are responsible for knowing and keeping track of what medications you take and those you need refilled. The day before your appointment: write a list of all prescriptions that need to be refilled. The day of the appointment: give the list to the admitting nurse. Prescriptions will be written only during appointments. If you forget a medication: it will not be "Called in", "Faxed", or "electronically sent". You will need to get another appointment to get these prescribed. No early refills. Do not call asking to have your prescription filled early. 4. Prescription Accuracy: You are responsible for  carefully inspecting your prescriptions before leaving our office. Have the discharge nurse carefully go over each prescription with you, before taking them home. Make sure that your name is accurately spelled, that your address is correct. Check the name and dose of your medication to make sure it is accurate. Check the number of pills, and the written instructions to make sure they are clear and accurate. Make sure that you are given enough medication to last until your next medication refill appointment. 5. Taking Medication: Take medication as prescribed. When it comes to controlled substances, taking less pills or less frequently than prescribed is permitted and encouraged. Never take more pills than instructed. Never take medication more frequently than prescribed.  6. Inform other Doctors: Always inform, all of your healthcare providers, of all the medications you take. 7. Pain Medication from other Providers: You are not allowed to accept any additional pain medication from any other Doctor or Healthcare provider. There are two exceptions to this rule. (see below) In the event that you require additional pain medication, you are responsible for notifying us, as stated below. 8. Medication Agreement: You are responsible for carefully reading and following our Medication Agreement. This must be signed before receiving any prescriptions from our practice. Safely store a copy of your signed Agreement. Violations to the Agreement will result in no further prescriptions. (Additional copies of our Medication Agreement are available upon request.) 9. Laws, Rules, & Regulations: All patients are expected to follow all Federal and State Laws, Statutes, Rules, & Regulations. Ignorance of the Laws does not constitute a valid excuse. The use of any illegal substances is prohibited. 10. Adopted CDC guidelines & recommendations: Target dosing levels will be at or below 60 MME/day. Use of benzodiazepines** is not  recommended.  Exceptions: There are only two exceptions to the rule of not receiving pain medications from other Healthcare Providers. 1.   Exception #1 (Emergencies): In the event of an emergency (i.e.: accident requiring emergency care), you are allowed to receive additional pain medication. However, you are responsible for: As soon as you are able, call our office [phone], at any time of the day or night, and leave a message stating your name, the date and nature of the emergency, and the name and dose of the medication prescribed. In the event that your call is answered by a member of our staff, make sure to document and save the date, time, and the name of the person that took your information.  2. Exception #2 (Planned Surgery): In the event that you are scheduled by another doctor or dentist to have any type of surgery or procedure, you are allowed (for a period no longer than 30 days), to receive additional pain medication, for the acute post-op pain. However, in this case, you are responsible for picking up a copy of our "Post-op Pain Management for Surgeons" handout, and giving it to your surgeon or dentist. This document is available at our office, and does not require an appointment to obtain it. Simply go to our office during business hours (Monday-Thursday from 8:00 AM to 4:00 PM) (Friday 8:00 AM to 12:00 Noon) or if you have a scheduled appointment with [place], prior to your surgery, and ask for it by name. In addition, you will need to provide [place] with your name, name of your surgeon, type of surgery, and date of procedure or surgery.  *Opioid medications include: morphine, codeine, oxycodone, oxymorphone, hydrocodone, hydromorphone, meperidine, tramadol, tapentadol, buprenorphine, fentanyl, methadone. **Benzodiazepine medications include: diazepam (Valium), alprazolam (Xanax), clonazepam (Klonopine), lorazepam (Ativan), clorazepate (Tranxene), chlordiazepoxide (Librium), estazolam (Prosom),  oxazepam (Serax), temazepam (Restoril), triazolam (Halcion) (Last updated: 01/02/2018) ____________________________________________________________________________________________    BMI Assessment: Estimated body mass index is 36.26 kg/m as calculated from the following:   Height as of this encounter: 5\' 11"  (1.803 m).   Weight as of this encounter: 260 lb (117.9 kg).  BMI interpretation table: BMI level Category Range association with higher incidence of chronic pain  <18 kg/m2 Underweight   18.5-24.9 kg/m2 Ideal body weight   25-29.9 kg/m2 Overweight Increased incidence by 20%  30-34.9 kg/m2 Obese (Class I) Increased incidence by 68%  35-39.9 kg/m2 Severe obesity (Class II) Increased incidence by 136%  >40 kg/m2 Extreme obesity (Class III) Increased incidence by 254%   Patient's current BMI Ideal Body weight  Body mass index is 36.26 kg/m. Ideal body weight: 75.3 kg (166 lb 0.1 oz) Adjusted ideal body weight: 92.4 kg (203 lb 9.7 oz)   BMI Readings from Last 4 Encounters:  11/10/18 36.26 kg/m  08/21/18 36.62 kg/m  05/21/18 35.26 kg/m  02/19/18 36.62 kg/m   Wt Readings from Last 4 Encounters:  11/10/18 260 lb (117.9 kg)  08/21/18 270 lb (122.5 kg)  05/21/18 260 lb (117.9 kg)  02/19/18 270 lb (122.5 kg)

## 2018-11-10 NOTE — Progress Notes (Signed)
Nursing Pain Medication Assessment:  Safety precautions to be maintained throughout the outpatient stay will include: orient to surroundings, keep bed in low position, maintain call bell within reach at all times, provide assistance with transfer out of bed and ambulation.  Medication Inspection Compliance: Pill count conducted under aseptic conditions, in front of the patient. Neither the pills nor the bottle was removed from the patient's sight at any time. Once count was completed pills were immediately returned to the patient in their original bottle.  Medication: Oxycodone IR Pill/Patch Count: 59 of 90 pills remain Pill/Patch Appearance: Markings consistent with prescribed medication Bottle Appearance: Standard pharmacy container. Clearly labeled. Filled Date: 51 / 27 / 2018 Last Medication intake:  Today

## 2018-11-10 NOTE — Progress Notes (Signed)
Patient's Name: James [name]  MRN: [id number]  Referring Provider: [name], MD  DOB: 05-Jan-1956  PCP: [name], MD  DOS: 11/10/2018  Note by: Vevelyn Francois NP  Service setting: Ambulatory outpatient  Specialty: Interventional Pain Management  Location: [facility] (AMB) Pain Management Facility    Patient type: Established    Primary Reason(s) for Visit: Encounter for prescription drug management. (Level of risk: moderate)  CC: Back Pain  HPI  James [name] is a 63 y.o. year old, male patient, who comes today for a medication management evaluation. He has Airway hyperreactivity; Atrial fibrillation ([place]); Narrowing of intervertebral disc space; Abnormal echocardiogram; Gastric ulcer; Gastric catarrh; Acid reflux; Awareness of heartbeats; Hemorrhoid; Hepatitis; BP (high blood pressure); Lactose intolerance; Osteopenia; APC (atrial premature contractions); Apnea, sleep; Supraventricular tachycardia ([place]); Long term current use of opiate analgesic; Long term prescription opiate use; Opiate use (120 MME/Day); Opiate dependence ([place]); Encounter for therapeutic drug level monitoring; Chronic low back pain (Primary Source of Pain) (Bilateral) (R>L); Lumbar spondylosis; Chronic neck pain (Right); Cervical spondylosis (C7 intravertebral body cyst); Chronic cervical radicular pain (Right); Chronic lumbar radicular pain (Bilateral) (L>R) (L5 Dermatome); Osteoarthritis; Chronic hip pain; Chronic knee pain (Bilateral) (R>L); Complex regional pain syndrome type II of upper extremity (Right); Chronic upper extremity pain (Right); 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); Epidural fibrosis; Musculoskeletal pain; Neurogenic pain; Neuropathic pain; Chronic sacroiliac joint pain (Bilateral)  (R>L); Psoriatic arthritis ([place]); Controlled type 2 diabetes mellitus without complication ([place]); Chest pain; Acquired hypothyroidism; Chronic lower extremity pain (Secondary source of pain) (Bilateral) (L>R); Psoriasis with arthropathy ([place]); Hypothyroidism; Numbness and tingling of both legs; Abnormal nerve conduction studies (06/20/16); Chronic pain syndrome; Hypotestosteronism; Nausea without vomiting; Acute postoperative pain; Perineal pain; Acquired cyst of kidney; Urinary urgency; Asthma; Chest pain with high risk for cardiac etiology; and Chronic upper extremity pain(R>L) on their problem list. His primarily concern today is the Back Pain  Pain Assessment: Location: Mid, Medial Back Radiating: Pain radiaities down arm , neck and leg Onset: More than a month ago Duration: Chronic pain Quality: Aching, Throbbing, Shooting Severity: 3 /10 (subjective, self-reported pain score)  Note: Reported level is compatible with observation.                          Effect on ADL: limits my daily activities Timing: Constant Modifying factors: medications, rest BP: 125/79  HR: 62  James [name] was last scheduled for an appointment on 10/24/2018 for medication management. During today's appointment we reviewed James [name] chronic pain status, as well as his outpatient medication regimen. He is concern about his about his neck. He admits that he has been on prednisone for 3 mons and it is has been amazing. He does having numbness and tingling in legs and arms. He admits that he has some pearmate area of numbness.   The patient  reports no history of drug use. His body mass index is 36.26 kg/m.  Further details on both, my assessment(s), as well as the proposed treatment plan, please see below.  Controlled Substance Pharmacotherapy Assessment REMS (Risk Evaluation and Mitigation Strategy)  Analgesic: oxycodone IR 20 mg every 8hours (60 mg/day) MME/day:120 mg/day  Chauncey Fischer, RN  11/10/2018   8:17 AM  Sign when Signing Visit Nursing Pain Medication Assessment:  Safety precautions to be  maintained throughout the outpatient stay will include: orient to surroundings, keep bed in low position, maintain call bell within reach at all times, provide assistance with transfer out of bed and ambulation.  Medication Inspection Compliance: Pill count conducted under aseptic conditions, in front of the patient. Neither the pills nor the bottle was removed from the patient's sight at any time. Once count was completed pills were immediately returned to the patient in their original bottle.  Medication: Oxycodone IR Pill/Patch Count: 59 of 90 pills remain Pill/Patch Appearance: Markings consistent with prescribed medication Bottle Appearance: Standard pharmacy container. Clearly labeled. Filled Date: 81 / 27 / 2018 Last Medication intake:  Today   Pharmacokinetics: Liberation and absorption (onset of action): WNL Distribution (time to peak effect): WNL Metabolism and excretion (duration of action): WNL         Pharmacodynamics: Desired effects: Analgesia: James [name] reports >50% benefit. Functional ability: Patient reports that medication allows him to accomplish basic ADLs Clinically meaningful improvement in function (CMIF): Sustained CMIF goals met Perceived effectiveness: Described as relatively effective, allowing for increase in activities of daily living (ADL) Undesirable effects: Side-effects or Adverse reactions: None reported Monitoring: [place] PMP: Online review of the past 51-monthperiod conducted. Compliant with practice rules and regulations Last UDS on record: Summary  Date Value Ref Range Status  05/21/2018 FINAL  Final    Comment:    ==================================================================== TOXASSURE SELECT 13 (MW) ==================================================================== Test                             Result       Flag       Units Drug Present  and Declared for Prescription Verification   Oxycodone                      1955         EXPECTED   ng/mg creat   Oxymorphone                    802          EXPECTED   ng/mg creat   Noroxycodone                   >3311        EXPECTED   ng/mg creat   Noroxymorphone                 733          EXPECTED   ng/mg creat    Sources of oxycodone are scheduled prescription medications.    Oxymorphone, noroxycodone, and noroxymorphone are expected    metabolites of oxycodone. Oxymorphone is also available as a    scheduled prescription medication.   Fentanyl                       4            EXPECTED   ng/mg creat   Norfentanyl                    43           EXPECTED   ng/mg creat    Source of fentanyl is a scheduled prescription medication,    including IV, patch, and transmucosal formulations. Norfentanyl    is an expected metabolite of fentanyl. ==================================================================== Test  Result    Flag   Units      Ref Range   Creatinine              302              mg/dL      >=20 ==================================================================== Declared Medications:  The flagging and interpretation on this report are based on the  following declared medications.  Unexpected results may arise from  inaccuracies in the declared medications.  **Note: The testing scope of this panel includes these medications:  Fentanyl  Oxycodone  **Note: The testing scope of this panel does not include following  reported medications:  Albuterol (Proventil)  Azelastine  Folic acid  Loratadine  Methotrexate  Metoprolol  Naloxone  Promethazine  Sucralfate  Tizanidine  Vitamin B  Vitamin D ==================================================================== For clinical consultation, please call [phone]. ====================================================================    UDS interpretation: Compliant          Medication  Assessment Form: Reviewed. Patient indicates being compliant with therapy Treatment compliance: Compliant Risk Assessment Profile: Aberrant behavior: See prior evaluations. None observed or detected today Comorbid factors increasing risk of overdose: See prior notes. No additional risks detected today Opioid risk tool (ORT) (Total Score): 0 Personal History of Substance Abuse (SUD-Substance use disorder):  Alcohol: Negative  Illegal Drugs: Negative  Rx Drugs: Negative  ORT Risk Level calculation: Low Risk Risk of substance use disorder (SUD): Low Opioid Risk Tool - 11/10/18 0825      Family History of Substance Abuse   Alcohol  Negative    Illegal Drugs  Negative    Rx Drugs  Negative      Personal History of Substance Abuse   Alcohol  Negative    Illegal Drugs  Negative    Rx Drugs  Negative      Age   Age between 71-45 years   No      History of Preadolescent Sexual Abuse   History of Preadolescent Sexual Abuse  Negative or Male      Psychological Disease   Psychological Disease  Negative    Depression  Negative      Total Score   Opioid Risk Tool Scoring  0    Opioid Risk Interpretation  Low Risk      ORT Scoring interpretation table:  Score <3 = Low Risk for SUD  Score between 4-7 = Moderate Risk for SUD  Score >8 = High Risk for Opioid Abuse   Risk Mitigation Strategies:  Patient Counseling: Covered Patient-Prescriber Agreement (PPA): Present and active  Notification to other healthcare providers: Done  Pharmacologic Plan: No change in therapy, at this time.             Laboratory Chemistry  Inflammation Markers (CRP: Acute Phase) (ESR: Chronic Phase) Lab Results  Component Value Date   CRP <0.80 12/06/2016   ESRSEDRATE 9 12/06/2016                         Rheumatology Markers No results found for: RF, ANA, LABURIC, URICUR, LYMEIGGIGMAB, LYMEABIGMQN, HLAB27                      Renal Function Markers Lab Results  Component Value Date   BUN 14  12/06/2016   CREATININE 1.20 01/31/2018   GFRAA >60 12/06/2016   GFRNONAA >60 12/06/2016  Hepatic Function Markers Lab Results  Component Value Date   AST 35 12/06/2016   ALT 33 12/06/2016   ALBUMIN 4.4 12/06/2016   ALKPHOS 47 12/06/2016                        Electrolytes Lab Results  Component Value Date   NA 139 12/06/2016   K 4.6 12/06/2016   CL 103 12/06/2016   CALCIUM 9.6 12/06/2016   MG 2.2 12/06/2016                        Neuropathy Markers Lab Results  Component Value Date   WYOVZCHY85 027 12/06/2016   HGBA1C 5.2 12/12/2017                        CNS Tests No results found for: COLORCSF, APPEARCSF, RBCCOUNTCSF, WBCCSF, POLYSCSF, LYMPHSCSF, EOSCSF, PROTEINCSF, GLUCCSF, JCVIRUS, CSFOLI, IGGCSF                      Bone Pathology Markers Lab Results  Component Value Date   25OHVITD1 42 12/06/2016   25OHVITD2 <1.0 12/06/2016   25OHVITD3 42 12/06/2016   TESTOSTERONE 285 02/12/2018                         Coagulation Parameters No results found for: INR, LABPROT, APTT, PLT, DDIMER, LABHEMA, VITAMINK1                      Cardiovascular Markers No results found for: BNP, CKTOTAL, CKMB, TROPONINI, HGB, HCT                       CA Markers No results found for: CEA, CA125, LABCA2                      Note: Lab results reviewed.  Recent Diagnostic Imaging Results  MR CERVICAL SPINE WO CONTRAST CLINICAL DATA:  Neck pain worse on the right with radiculopathy. Numbness in the arms and hands with worsening symptoms in the past 6 months.  EXAM: MRI CERVICAL SPINE WITHOUT CONTRAST  TECHNIQUE: Multiplanar, multisequence MR imaging of the cervical spine was performed. No intravenous contrast was administered.  COMPARISON:  12/13/2016  FINDINGS: Alignment: Chronic cervical spine straightening. Unchanged minimal anterolisthesis of C3 on C4.  Vertebrae: No fracture or suspicious osseous lesion. Chronic degenerative endplate  changes at X4-1 with decreased degenerative endplate edema.  Cord: Normal signal.  Posterior Fossa, vertebral arteries, paraspinal tissues: Unremarkable.  Disc levels:  C2-3: Mild right and moderate to severe left facet arthrosis without stenosis, unchanged.  C3-4: Central disc protrusion, uncovertebral spurring, and severe right and moderate left facet arthrosis result in borderline spinal stenosis with slight ventral cord flattening and moderate right neural foraminal stenosis, unchanged.  C4-5: Minimal disc bulging, mild uncovertebral spurring, and moderate right facet arthrosis without stenosis, unchanged.  C5-6: Moderate disc space narrowing. Disc bulging, uncovertebral spurring, and moderate right and mild left facet arthrosis result in mild spinal stenosis and moderate right greater than left neural foraminal stenosis, unchanged.  C6-7: Severe disc space narrowing. Broad-based posterior disc osteophyte complex results in mild spinal stenosis and mild-to-moderate right and borderline left neural foraminal stenosis, unchanged.  C7-T1: Minimal disc bulging and moderate to severe right and mild left facet arthrosis result in mild right neural foraminal stenosis without spinal  stenosis, unchanged.  IMPRESSION: 1. Decreased degenerative endplate edema at S2-8. 2. Otherwise unchanged appearance of the cervical spine with up to mild spinal and moderate neural foraminal stenosis at multiple levels as above.  Electronically Signed   By: Logan Bores M.D.   On: 09/19/2018 10:30  Complexity Note: Imaging results reviewed. Results shared with James [name], using Layman's terms.                         Meds   Current Outpatient Medications:  .  albuterol (PROAIR HFA) 108 (90 BASE) MCG/ACT inhaler, Inhale 2 puffs into the lungs every 4 (four) hours as needed for wheezing or shortness of breath. , Disp: , Rfl:  .  azelastine (ASTELIN) 0.1 % nasal spray, Place 1 spray into  both nostrils 2 (two) times daily. , Disp: , Rfl:  .  folic acid (FOLVITE) 1 MG tablet, Take 2 mg by mouth 2 (two) times daily. , Disp: , Rfl:  .  loratadine (CLARITIN) 10 MG tablet, Take 10 mg by mouth daily., Disp: , Rfl:  .  methotrexate 50 MG/2ML injection, Inject 50 mg/m2 as directed once a week. , Disp: , Rfl:  .  metoprolol (LOPRESSOR) 100 MG tablet, TAKE 1 TABLET BY MOUTH TWICE A DAY, Disp: , Rfl:  .  naloxone (NARCAN) nasal spray 4 mg/0.1 mL, Spray into one nostril. Repeat with second device into other nostril after 2-3 minutes if no or minimal response. Use in case of opioid overdose., Disp: 1 kit, Rfl: 0 .  [START ON 01/29/2019] Oxycodone HCl 20 MG TABS, Take 1 tablet (20 mg total) by mouth every 8 (eight) hours as needed., Disp: 90 tablet, Rfl: 0 .  [START ON 12/30/2018] Oxycodone HCl 20 MG TABS, Take 1 tablet (20 mg total) by mouth every 8 (eight) hours as needed., Disp: 90 tablet, Rfl: 0 .  [START ON 11/30/2018] Oxycodone HCl 20 MG TABS, Take 1 tablet (20 mg total) by mouth every 8 (eight) hours., Disp: 90 tablet, Rfl: 0 .  tiZANidine (ZANAFLEX) 4 MG capsule, Take 4 mg by mouth 3 (three) times daily as needed for muscle spasms., Disp: , Rfl:   ROS  Constitutional: Denies any fever or chills Gastrointestinal: No reported hemesis, hematochezia, vomiting, or acute GI distress Musculoskeletal: Denies any acute onset joint swelling, redness, loss of ROM, or weakness Neurological: No reported episodes of acute onset apraxia, aphasia, dysarthria, agnosia, amnesia, paralysis, loss of coordination, or loss of consciousness  Allergies  James [name] is allergic to penicillin g; penicillins; lactose; nsaids; procaine; sulfasalazine; talwin [pentazocine]; codeine; and sulfa antibiotics.  [place]  Drug: James [name]  reports no history of drug use. Alcohol:  reports no history of alcohol use. Tobacco:  reports that he has quit smoking. His smoking use included cigarettes. His smokeless tobacco use  includes chew. Medical:  has a past medical history of Allergy, Anemia, Arthritis, Asthma, Blood transfusion without reported diagnosis, Bronchitis, Bursitis, Cancer ([place]), Cataract, 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09/13/2015), Emphysema of lung ([place]), Gastritis, GERD (gastroesophageal reflux disease), Heart murmur, Hepatitis C, Hiatal hernia, Hypertension, Hypothyroidism, Kidney stone, Lupus ([place]), Obesity, Osteoporosis, Peripheral nerve disease, Psoriatic arthritis ([place]), Sleep apnea, Supraventricular tachycardia ([place]), Tendonitis, and Thyroid disease. Surgical: James [name]  has a past surgical history that includes Spine surgery; Eye surgery; Fracture surgery (Right); Fracture surgery (Bilateral); Fracture surgery (Bilateral); Joint replacement (Right); Spinal cord stimulator implant; Spinal cord stimulator removal; Patella arthroplasty; Shoulder arthroscopy (Bilateral);  Esophagogastroduodenoscopy (egd) with propofol (N/A, 01/20/2016); and Colonoscopy (2009). Family: family history includes Arthritis in his father and mother; COPD in his father; Cancer in his father and mother; Diabetes in his mother; Hematuria in his father and mother; Hypertension in his father and mother; Kidney disease in his mother; Kidney failure in his mother; Prostate cancer in his father; Stroke in his mother.  Constitutional Exam  General appearance: Well nourished, well developed, and well hydrated. In no apparent acute distress Vitals:   11/10/18 0817  BP: 125/79  Pulse: 62  Temp: 97.7 F (36.5 C)  SpO2: 100%  Weight: 260 lb (117.9 kg)  Height: '5\' 11"'$  (1.803 m)  Psych/Mental status: Alert, oriented x 3 (person, place, & time)       Eyes: PERLA Respiratory: No evidence of acute respiratory distress  Cervical Spine Area Exam  Skin & Axial Inspection: No masses, redness, edema, swelling, or associated skin lesions Alignment: Symmetrical Functional ROM: Unrestricted ROM       Stability: No instability detected Muscle Tone/Strength: Functionally intact. No obvious neuro-muscular anomalies detected. Sensory (Neurological): Unimpaired Palpation: No palpable anomalies              Upper Extremity (UE) Exam    Side: Right upper extremity  Side: Left upper extremity  Skin & Extremity Inspection: Skin color, temperature, and hair growth are WNL. No peripheral edema or cyanosis. No masses, redness, swelling, asymmetry, or associated skin lesions. No contractures.  Skin & Extremity Inspection: Skin color, temperature, and hair growth are WNL. No peripheral edema or cyanosis. No masses, redness, swelling, asymmetry, or associated skin lesions. No contractures.  Functional ROM: Unrestricted ROM          Functional ROM: Unrestricted ROM          Muscle Tone/Strength: Functionally intact. No obvious neuro-muscular anomalies detected.  Muscle Tone/Strength: Functionally intact. No obvious neuro-muscular anomalies detected.  Sensory (Neurological): Dermatomal pain pattern          Sensory (Neurological): Dermatomal pain pattern          Palpation: No palpable anomalies              Palpation: No palpable anomalies                   Thoracic Spine Area Exam  Skin & Axial Inspection: No masses, redness, or swelling Alignment: Symmetrical Functional ROM: Unrestricted ROM Stability: No instability detected Muscle Tone/Strength: Functionally intact. No obvious neuro-muscular anomalies detected. Sensory (Neurological): Unimpaired Muscle strength & Tone: No palpable anomalies  Lumbar Spine Area Exam  Skin & Axial Inspection: Well healed scar from previous spine surgery detected Alignment: Symmetrical Functional ROM: Unrestricted ROM       Stability: No instability detected Muscle Tone/Strength: Functionally intact. No obvious neuro-muscular anomalies detected. Sensory (Neurological): Unimpaired Palpation: Uncomfortable         Gait & Posture Assessment  Ambulation:  Unassisted Gait: Relatively normal for age and body habitus Posture: WNL   Lower Extremity Exam    Side: Right lower extremity  Side: Left lower extremity  Stability: No instability observed          Stability: No instability observed          Skin & Extremity Inspection: Skin color, temperature, and hair growth are WNL. No peripheral edema or cyanosis. No masses, redness, swelling, asymmetry, or associated skin lesions. No contractures.  Skin & Extremity Inspection: Skin color, temperature, and hair growth are WNL. No peripheral edema or cyanosis. No  masses, redness, swelling, asymmetry, or associated skin lesions. No contractures.  Functional ROM: Unrestricted ROM                  Functional ROM: Unrestricted ROM                  Muscle Tone/Strength: Functionally intact. No obvious neuro-muscular anomalies detected.  Muscle Tone/Strength: Functionally intact. No obvious neuro-muscular anomalies detected.  Sensory (Neurological): Unimpaired        Sensory (Neurological): Unimpaired        Palpation: No palpable anomalies  Palpation: No palpable anomalies   Assessment  Primary Diagnosis & Pertinent Problem List: The primary encounter diagnosis was Lumbar spondylosis. Diagnoses of Cervical spondylosis (C7 intravertebral body cyst), Primary osteoarthritis involving multiple joints, Chronic pain syndrome, and Long term current use of opiate analgesic were also pertinent to this visit.  Status Diagnosis  Persistent Persistent Persistent 1. Lumbar spondylosis   2. Cervical spondylosis (C7 intravertebral body cyst)   3. Primary osteoarthritis involving multiple joints   4. Chronic pain syndrome   5. Long term current use of opiate analgesic     Problems updated and reviewed during this visit: No problems updated. Plan of Care  Pharmacotherapy (Medications Ordered): Meds ordered this encounter  Medications  . Oxycodone HCl 20 MG TABS    Sig: Take 1 tablet (20 mg total) by mouth every 8  (eight) hours as needed.    Dispense:  90 tablet    Refill:  0    Do not place this medication, or any other prescription from our practice, on "Automatic Refill". Patient may have prescription filled one day early if pharmacy is closed on scheduled refill date.    Order Specific Question:   Supervising Provider    Answer:   Milinda Pointer [phone]  . Oxycodone HCl 20 MG TABS    Sig: Take 1 tablet (20 mg total) by mouth every 8 (eight) hours as needed.    Dispense:  90 tablet    Refill:  0    Do not place this medication, or any other prescription from our practice, on "Automatic Refill". Patient may have prescription filled one day early if pharmacy is closed on scheduled refill date.    Order Specific Question:   Supervising Provider    Answer:   Milinda Pointer [phone]  . Oxycodone HCl 20 MG TABS    Sig: Take 1 tablet (20 mg total) by mouth every 8 (eight) hours.    Dispense:  90 tablet    Refill:  0    Do not place this medication, or any other prescription from our practice, on "Automatic Refill". Patient may have prescription filled one day early if pharmacy is closed on scheduled refill date.    Order Specific Question:   Supervising Provider    Answer:   Milinda Pointer [[id number]]   New Prescriptions   No medications on file   Medications administered today: Gilford Rile. Puls had no medications administered during this visit. Lab-work, procedure(s), and/or referral(s): Orders Placed This Encounter  Procedures  . ToxASSURE Select 13 (MW), Urine   Imaging and/or referral(s): None  Interventional therapies: Planned, scheduled, and/or pending: Not at this time.     Considering:  Diagnostic Right-sided cervical epiduralsteroid injection.  Diagnostic Right-sided L4-5 lumbar epiduralsteroid injection.  Diagnostic bilateral lumbar facetblock. Possible bilateral lumbar facet radiofrequencyablation. Diagnostic bilateral cervical facetblock.  Possible  bilateral cervical facet radiofrequencyablation.   Palliative PRN treatment(s):  For the arm pain and  numbness, right-sided cervical epidural steroid injection For the lower extremity pain, right-sided L4-5 lumbar epidural steroid injection For the low back pain, diagnostic bilateral lumbar facet block For the neck pain, diagnostic bilateral cervical facet block    Provider-requested follow-up: Return in about 3 months (around 02/09/2019) for MedMgmt.  Future Appointments  Date Time Provider [place]  02/09/2019  8:00 AM Vevelyn Francois, NP [facility] None   Primary Care Physician: [name], MD Location: [facility] Note by: Vevelyn Francois NP Date: 11/10/2018; Time: 9:00 AM  Pain Score Disclaimer: We use the NRS-11 scale. This is a self-reported, subjective measurement of pain severity with only modest accuracy. It is used primarily to identify changes within a particular patient. It must be understood that outpatient pain scales are significantly less accurate that those used for research, where they can be applied under ideal controlled circumstances with minimal exposure to variables. In reality, the score is likely to be a combination of pain intensity and pain affect, where pain affect describes the degree of emotional arousal or changes in action readiness caused by the sensory experience of pain. Factors such as social and work situation, setting, emotional state, anxiety levels, expectation, and prior pain experience may influence pain perception and show large inter-individual differences that may also be affected by time variables.  Patient instructions provided during this appointment: Patient Instructions   ____________________________________________________________________________________________  Medication Rules  Purpose: To inform patients, and their family members, of our rules and regulations.  Applies to: All patients  receiving prescriptions (written or electronic).  Pharmacy of record: Pharmacy where electronic prescriptions will be sent. If written prescriptions are taken to a different pharmacy, please inform the nursing staff. The pharmacy listed in the electronic medical record should be the one where you would like electronic prescriptions to be sent.  Electronic prescriptions: In compliance with the [place] (STOP) Act of 2017 (Session Lanny Cramp 717 205 [id number]), effective November 05, 2018, all controlled substances must be electronically prescribed. Calling prescriptions to the pharmacy will cease to exist.  Prescription refills: Only during scheduled appointments. Applies to all prescriptions.  NOTE: The following applies primarily to controlled substances (Opioid* Pain Medications).   Patient's responsibilities: 1. Pain Pills: Bring all pain pills to every appointment (except for procedure appointments). 2. Pill Bottles: Bring pills in original pharmacy bottle. Always bring the newest bottle. Bring bottle, even if empty. 3. Medication refills: You are responsible for knowing and keeping track of what medications you take and those you need refilled. The day before your appointment: write a list of all prescriptions that need to be refilled. The day of the appointment: give the list to the admitting nurse. Prescriptions will be written only during appointments. If you forget a medication: it will not be "Called in", "Faxed", or "electronically sent". You will need to get another appointment to get these prescribed. No early refills. Do not call asking to have your prescription filled early. 4. Prescription Accuracy: You are responsible for carefully inspecting your prescriptions before leaving our office. Have the discharge nurse carefully go over each prescription with you, before taking them home. Make sure that your name is accurately spelled, that your address is  correct. Check the name and dose of your medication to make sure it is accurate. Check the number of pills, and the written instructions to make sure they are clear and accurate. Make sure that you are given enough medication to last until your next medication  refill appointment. 5. Taking Medication: Take medication as prescribed. When it comes to controlled substances, taking less pills or less frequently than prescribed is permitted and encouraged. Never take more pills than instructed. Never take medication more frequently than prescribed.  6. Inform other Doctors: Always inform, all of your healthcare providers, of all the medications you take. 7. Pain Medication from other Providers: You are not allowed to accept any additional pain medication from any other Doctor or Healthcare provider. There are two exceptions to this rule. (see below) In the event that you require additional pain medication, you are responsible for notifying us, as stated below. 8. Medication Agreement: You are responsible for carefully reading and following our Medication Agreement. This must be signed before receiving any prescriptions from our practice. Safely store a copy of your signed Agreement. Violations to the Agreement will result in no further prescriptions. (Additional copies of our Medication Agreement are available upon request.) 9. Laws, Rules, & Regulations: All patients are expected to follow all Federal and [employer], [employer], Rules, [employer]. Ignorance of the Laws does not constitute a valid excuse. The use of any illegal substances is prohibited. 10. Adopted CDC guidelines & recommendations: Target dosing levels will be at or below 60 MME/day. Use of benzodiazepines** is not recommended.  Exceptions: There are only two exceptions to the rule of not receiving pain medications from other Healthcare Providers. 1. Exception #1 (Emergencies): In the event of an emergency (i.e.: accident requiring emergency  care), you are allowed to receive additional pain medication. However, you are responsible for: As soon as you are able, call our office [phone], at any time of the day or night, and leave a message stating your name, the date and nature of the emergency, and the name and dose of the medication prescribed. In the event that your call is answered by a member of our staff, make sure to document and save the date, time, and the name of the person that took your information.  2. Exception #2 (Planned Surgery): In the event that you are scheduled by another doctor or dentist to have any type of surgery or procedure, you are allowed (for a period no longer than 30 days), to receive additional pain medication, for the acute post-op pain. However, in this case, you are responsible for picking up a copy of our "Post-op Pain Management for Surgeons" handout, and giving it to your [name] or dentist. This document is available at our office, and does not require an appointment to obtain it. Simply go to our office during business hours (Monday-Thursday from 8:00 AM to 4:00 PM) (Friday 8:00 AM to 12:00 Noon) or if you have a scheduled appointment with [place], prior to your surgery, and ask for it by name. In addition, you will need to provide [place] with your name, name of your [name], type of surgery, and date of procedure or surgery.  *Opioid medications include: morphine, codeine, oxycodone, oxymorphone, hydrocodone, hydromorphone, meperidine, tramadol, tapentadol, buprenorphine, fentanyl, methadone. **Benzodiazepine medications include: diazepam (Valium), alprazolam (Xanax), clonazepam (Klonopine), lorazepam (Ativan), clorazepate (Tranxene), chlordiazepoxide (Librium), estazolam (Prosom), oxazepam (Serax), temazepam (Restoril), triazolam (Halcion) (Last updated: 01/02/2018) ____________________________________________________________________________________________    BMI Assessment: Estimated body mass index is  36.26 kg/m as calculated from the following:   Height as of this encounter: '5\' 11"'$  (1.803 m).   Weight as of this encounter: 260 lb (117.9 kg).  BMI interpretation table: BMI level Category Range association with higher incidence of chronic pain  <18 kg/m2  Underweight   18.5-24.9 kg/m2 Ideal body weight   25-29.9 kg/m2 Overweight Increased incidence by 20%  30-34.9 kg/m2 Obese (Class I) Increased incidence by 68%  35-39.9 kg/m2 Severe obesity (Class II) Increased incidence by 136%  >40 kg/m2 Extreme obesity (Class III) Increased incidence by 254%   Patient's current BMI Ideal Body weight  Body mass index is 36.26 kg/m. Ideal body weight: 75.3 kg (166 lb 0.1 oz) Adjusted ideal body weight: 92.4 kg (203 lb 9.7 oz)   BMI Readings from Last 4 Encounters:  11/10/18 36.26 kg/m  08/21/18 36.62 kg/m  05/21/18 35.26 kg/m  02/19/18 36.62 kg/m   Wt Readings from Last 4 Encounters:  11/10/18 260 lb (117.9 kg)  08/21/18 270 lb (122.5 kg)  05/21/18 260 lb (117.9 kg)  02/19/18 270 lb (122.5 kg)

## 2018-11-13 LAB — TOXASSURE SELECT 13 (MW), URINE

## 2018-12-19 DIAGNOSIS — M1990 Unspecified osteoarthritis, unspecified site: Secondary | ICD-10-CM | POA: Diagnosis not present

## 2018-12-19 DIAGNOSIS — R918 Other nonspecific abnormal finding of lung field: Secondary | ICD-10-CM | POA: Diagnosis not present

## 2018-12-19 DIAGNOSIS — J310 Chronic rhinitis: Secondary | ICD-10-CM | POA: Diagnosis not present

## 2018-12-19 DIAGNOSIS — Z79899 Other long term (current) drug therapy: Secondary | ICD-10-CM | POA: Diagnosis not present

## 2018-12-19 DIAGNOSIS — R0609 Other forms of dyspnea: Secondary | ICD-10-CM | POA: Diagnosis not present

## 2018-12-19 DIAGNOSIS — G4733 Obstructive sleep apnea (adult) (pediatric): Secondary | ICD-10-CM | POA: Diagnosis not present

## 2018-12-31 ENCOUNTER — Other Ambulatory Visit: Payer: Self-pay | Admitting: *Deleted

## 2018-12-31 NOTE — Patient Outreach (Signed)
[facility]) Care Management  12/31/2018  James [name] [birth year] [id number]   Telephone Screen  Referral Date: 12/30/2018 Referral Source:  Nurse call center Referral Reason: caller states c/o extreme fatigue, hands & feet stay  Cold and occasional nose bleeds during the day. He has questions about recent lab work drawn. He has questions about recent lab work drawn. RBC's 3.5, MCV >100, Platelets 200, microcytosis 1+  Hx of lung tumors kidney tumors paralyzed diaphragm, sarcoidosis of skin RN recommended he go to ED now (or PCP triage)  Insurance: HTA  Outreach attempt # 1 No answer. [facility] RN CM left HIPAA compliant voicemail message along with CM's contact info.   Plan: [facility] RN CM sent an unsuccessful outreach letter and scheduled this patient for another call attempt within 4 business days   Kimberly L. Lavina Hamman, RN, BSN, [place] Coordinator Office number [phone] Mobile number [phone]  Main [facility] number [phone] Fax number [phone]

## 2019-01-01 ENCOUNTER — Ambulatory Visit: Payer: Self-pay | Admitting: *Deleted

## 2019-01-02 ENCOUNTER — Other Ambulatory Visit: Payer: Self-pay | Admitting: *Deleted

## 2019-01-02 NOTE — Patient Outreach (Signed)
[facility]) Care Management  01/02/2019  James [name] 29-Jan-1956 [id number]   Telephone Screen  Referral Date: 12/30/2018 Referral Source:  Nurse call center Referral Reason: caller states c/o extreme fatigue, hands & feet stay  Cold and occasional nose bleeds during the day. He has questions about recent lab work drawn. He has questions about recent lab work drawn. RBC's 3.5, MCV >100, Platelets 200, microcytosis 1+  Hx of lung tumors kidney tumors paralyzed diaphragm, sarcoidosis of skin RN recommended he go to ED now (or PCP triage)  Insurance: HTA  Outreach attempt # 2 No answer. [facility] RN CM left HIPAA compliant voicemail message along with CM's contact info.   Plan: [facility] RN CM scheduled this patient for another call attempt within 4 business days   Kimberly L. Lavina Hamman, RN, BSN, [place] Coordinator Office number [phone] Mobile number [phone]  Main [facility] number [phone] Fax number [phone]

## 2019-01-06 ENCOUNTER — Other Ambulatory Visit: Payer: Self-pay | Admitting: *Deleted

## 2019-01-06 NOTE — Patient Outreach (Signed)
[facility]) Care Management  01/06/2019  James [name] October 13, 1956 [id number]   Telephone Screen  Referral Date:12/30/2018 Referral Source:Nurse call center Referral Reason:caller states c/o extreme fatigue, hands &feet stay Cold and occasional nose bleeds during the day. He has questions about recent lab work drawn. He has questions about recent lab work drawn. RBC's 3.5, MCV >100, Platelets 200, microcytosis 1+  Hx of lung tumors kidney tumors paralyzed diaphragm, sarcoidosis of skin RN recommended he go to ED now (or PCP triage) Insurance:HTA  Outreach attempt # 3 No answer. [facility] RN CM left HIPAA compliant voicemail message along with CM's contact info.   Plan: [facility] RN CM scheduled this patient for case closure  within 7 business days Voice messages x 3 and an unsuccessful outreach letter has been sent to this patient with attempts to reach patient without success   [name] L. Lavina Hamman, RN, BSN, [place] Coordinator Office number [phone] Mobile number [phone]  Main [facility] number [phone] Fax number [phone]

## 2019-01-09 ENCOUNTER — Other Ambulatory Visit: Payer: Self-pay | Admitting: *Deleted

## 2019-01-09 DIAGNOSIS — G8929 Other chronic pain: Secondary | ICD-10-CM | POA: Diagnosis not present

## 2019-01-09 DIAGNOSIS — R5383 Other fatigue: Secondary | ICD-10-CM | POA: Diagnosis not present

## 2019-01-09 DIAGNOSIS — E349 Endocrine disorder, unspecified: Secondary | ICD-10-CM | POA: Diagnosis not present

## 2019-01-09 DIAGNOSIS — Z125 Encounter for screening for malignant neoplasm of prostate: Secondary | ICD-10-CM | POA: Diagnosis not present

## 2019-01-09 DIAGNOSIS — G4730 Sleep apnea, unspecified: Secondary | ICD-10-CM | POA: Diagnosis not present

## 2019-01-09 NOTE — Patient Outreach (Addendum)
[facility]) Care Management  01/09/2019  KAMARII CARTON 1956-07-06 [id number]   Telephone Screen  Referral Date:12/30/2018 Referral Source:Nurse call center Referral Reason:caller states c/o extreme fatigue, hands &feet stay Cold and occasional nose bleeds during the day. He has questions about recent lab work drawn. He has questions about recent lab work drawn. RBC's 3.5, MCV >100, Platelets 200, microcytosis 1+  Hx of lung tumors kidney tumors paralyzed diaphragm, sarcoidosis of skin RN recommended he go to ED now (or PCP triage) Insurance:HTA   Patient returned a call to [facility] RN CM Patient is able to verify HIPAA Reviewed and addressed nurse call center referral to [facility] with patient   Mr Bufkin informs Cm he has just left his primary MD office for evaluation and labs. Mr Manor voiced concern to CM about "believing that something is wrong with me" that he states his provider is not noting. "I feel better than when I made the call to the nurse"  He reports he is on Methotrexate and voices a concern with noticing a trend of his rbc value decreasing. He voices concern about paleness and anemia He reports being placed on prednisone that did help increase his rbc value   He discusses his mother was a Therapist, sports, his wife is a retired Barrister's clerk and his grand daughter is a Therapist, sports. He voices that his family have voiced concern with his medical treatment   He discusses a possible second opinion. CM discussed the HTA concierge or the HTA on line provider site to offer a list of in network local providers for him to review  [facility] RN CM r reviewed [facility] services with him to included [facility], pharmacy, SW, NP and health coaches. Cm offered to refer him to [place] CM. He wants to first try tot see if there is another provider he can see for a second opinion He states he plans to call the HTA Concierge line to get a list of provider in his area first to discuss with  his wife He informs CM he may call back to CM to possibly request assist from [facility] staff prn   Social: Mr Voeltz is disabled and living with his wife, his primary caregiver. He is independent with ADLs and needs independent/assist with iADLs. He denies issues with transportation to medical appointments    Conditions: atrial fibrillation, asthma, hepatitis, controlled type 2 DM, hypothyroidism, high blood pressure, atrial premature contractions supra ventricular tachycardia sleep apnea, gastric ulcer, psoriatic arthritis, long term current use of opiate analgesic, Chronic low back, hip, neck and knee pain, hx of a failed back surgical syndrome DME cane , CPAP   Medications: He denies concerns with taking medications as prescribed, affording medications, side effects of medications and questions about medications  Appointments: He was seen by his primary MD on today 01/09/2019  Advance Directives: He has a POA  Consent: [facility] RN CM reviewed [facility] services with patient. Patient gave verbal consent for services. He denies need of services from [facility]/Telephonic RN CM, pharmacy, health coach, NP or SW at this time   [place]. Lavina Hamman, RN, BSN, [place] Coordinator Office number [phone] Mobile number [phone]  Main [facility] number [phone] Fax number [phone]

## 2019-02-09 ENCOUNTER — Other Ambulatory Visit: Payer: Self-pay

## 2019-02-09 ENCOUNTER — Ambulatory Visit (HOSPITAL_BASED_OUTPATIENT_CLINIC_OR_DEPARTMENT_OTHER): Payer: PPO | Admitting: Nurse Practitioner

## 2019-02-09 DIAGNOSIS — G894 Chronic pain syndrome: Secondary | ICD-10-CM

## 2019-02-09 NOTE — Progress Notes (Signed)
Pain Management Encounter Note - Virtual Visit via Telephone Telehealth (real-time audio visits between healthcare provider and patient).  Patient's Phone No. & Preferred Pharmacy:  [phone] (home); [phone] (mobile); (Preferred) [phone]  [id number] , [place] - [address] [place] [address] Phone: [phone] Fax: [phone]   Pre-screening note:  Our staff contacted Mr. Macomber and offered him an "in person", "face-to-face" appointment versus a telephone encounter. He indicated preferring the telephone encounter, at this time.  Reason for Virtual Visit: COVID-19*  Social distancing based on CDC and AMA recommendations.   I initiated contact with Sanjuana Kava on 02/09/2019 at 7:59 AM by telephone, but I got no answer. I left a message to reschedule the call.   Total duration of non-face-to-face encounter: 19minutes.  Note by: Dionisio David, NP Date: 02/09/2019; Time: 8:05 AM  Disclaimer:  * Given the special circumstances of the COVID-19 pandemic, the federal government has announced that the Office for Civil Rights (OCR) will exercise its enforcement discretion and will not impose penalties on physicians using telehealth in the event of noncompliance with regulatory requirements under the [place] (HIPAA) in connection with the good faith provision of telehealth during the DQQIW-97 national public health emergency. ([place])

## 2019-03-05 ENCOUNTER — Ambulatory Visit: Payer: PPO | Attending: Nurse Practitioner | Admitting: Nurse Practitioner

## 2019-03-05 ENCOUNTER — Other Ambulatory Visit: Payer: Self-pay

## 2019-03-05 ENCOUNTER — Encounter: Payer: Self-pay | Admitting: Nurse Practitioner

## 2019-03-05 DIAGNOSIS — M533 Sacrococcygeal disorders, not elsewhere classified: Secondary | ICD-10-CM | POA: Diagnosis not present

## 2019-03-05 DIAGNOSIS — M47816 Spondylosis without myelopathy or radiculopathy, lumbar region: Secondary | ICD-10-CM | POA: Diagnosis not present

## 2019-03-05 DIAGNOSIS — M47812 Spondylosis without myelopathy or radiculopathy, cervical region: Secondary | ICD-10-CM

## 2019-03-05 DIAGNOSIS — G8929 Other chronic pain: Secondary | ICD-10-CM | POA: Diagnosis not present

## 2019-03-05 DIAGNOSIS — G894 Chronic pain syndrome: Secondary | ICD-10-CM | POA: Diagnosis not present

## 2019-03-05 DIAGNOSIS — I480 Paroxysmal atrial fibrillation: Secondary | ICD-10-CM | POA: Diagnosis not present

## 2019-03-05 MED ORDER — OXYCODONE HCL 20 MG PO TABS: 20.0000 mg | ORAL_TABLET | Freq: Three times a day (TID) | ORAL | 0 refills | Status: DC | PRN

## 2019-03-05 MED ORDER — OXYCODONE HCL 20 MG PO TABS: 20.0000 mg | ORAL_TABLET | Freq: Three times a day (TID) | ORAL | 0 refills | Status: DC

## 2019-03-05 NOTE — Patient Instructions (Signed)
____________________________________________________________________________________________  Medication Rules  Purpose: To inform patients, and their family members, of our rules and regulations.  Applies to: All patients receiving prescriptions (written or electronic).  Pharmacy of record: Pharmacy where electronic prescriptions will be sent. If written prescriptions are taken to a different pharmacy, please inform the nursing staff. The pharmacy listed in the electronic medical record should be the one where you would like electronic prescriptions to be sent.  Electronic prescriptions: In compliance with the [place] Strengthen Opioid Misuse Prevention (STOP) Act of 2017 (Session Law 2017-74/H243), effective November 05, 2018, all controlled substances must be electronically prescribed. Calling prescriptions to the pharmacy will cease to exist.  Prescription refills: Only during scheduled appointments. Applies to all prescriptions.  NOTE: The following applies primarily to controlled substances (Opioid* Pain Medications).   Patient's responsibilities: 1. Pain Pills: Bring all pain pills to every appointment (except for procedure appointments). 2. Pill Bottles: Bring pills in original pharmacy bottle. Always bring the newest bottle. Bring bottle, even if empty. 3. Medication refills: You are responsible for knowing and keeping track of what medications you take and those you need refilled. The day before your appointment: write a list of all prescriptions that need to be refilled. The day of the appointment: give the list to the admitting nurse. Prescriptions will be written only during appointments. No prescriptions will be written on procedure days. If you forget a medication: it will not be "Called in", "Faxed", or "electronically sent". You will need to get another appointment to get these prescribed. No early refills. Do not call asking to have your prescription filled  early. 4. Prescription Accuracy: You are responsible for carefully inspecting your prescriptions before leaving our office. Have the discharge nurse carefully go over each prescription with you, before taking them home. Make sure that your name is accurately spelled, that your address is correct. Check the name and dose of your medication to make sure it is accurate. Check the number of pills, and the written instructions to make sure they are clear and accurate. Make sure that you are given enough medication to last until your next medication refill appointment. 5. Taking Medication: Take medication as prescribed. When it comes to controlled substances, taking less pills or less frequently than prescribed is permitted and encouraged. Never take more pills than instructed. Never take medication more frequently than prescribed.  6. Inform other Doctors: Always inform, all of your healthcare providers, of all the medications you take. 7. Pain Medication from other Providers: You are not allowed to accept any additional pain medication from any other Doctor or Healthcare provider. There are two exceptions to this rule. (see below) In the event that you require additional pain medication, you are responsible for notifying us, as stated below. 8. Medication Agreement: You are responsible for carefully reading and following our Medication Agreement. This must be signed before receiving any prescriptions from our practice. Safely store a copy of your signed Agreement. Violations to the Agreement will result in no further prescriptions. (Additional copies of our Medication Agreement are available upon request.) 9. Laws, Rules, & Regulations: All patients are expected to follow all Federal and State Laws, Statutes, Rules, & Regulations. Ignorance of the Laws does not constitute a valid excuse. The use of any illegal substances is prohibited. 10. Adopted CDC guidelines & recommendations: Target dosing levels will be  at or below 60 MME/day. Use of benzodiazepines** is not recommended.  Exceptions: There are only two exceptions to the rule of not   receiving pain medications from other Healthcare Providers. 1. Exception #1 (Emergencies): In the event of an emergency (i.e.: accident requiring emergency care), you are allowed to receive additional pain medication. However, you are responsible for: As soon as you are able, call our office [phone], at any time of the day or night, and leave a message stating your name, the date and nature of the emergency, and the name and dose of the medication prescribed. In the event that your call is answered by a member of our staff, make sure to document and save the date, time, and the name of the person that took your information.  2. Exception #2 (Planned Surgery): In the event that you are scheduled by another doctor or dentist to have any type of surgery or procedure, you are allowed (for a period no longer than 30 days), to receive additional pain medication, for the acute post-op pain. However, in this case, you are responsible for picking up a copy of our "Post-op Pain Management for Surgeons" handout, and giving it to your surgeon or dentist. This document is available at our office, and does not require an appointment to obtain it. Simply go to our office during business hours (Monday-Thursday from 8:00 AM to 4:00 PM) (Friday 8:00 AM to 12:00 Noon) or if you have a scheduled appointment with us, prior to your surgery, and ask for it by name. In addition, you will need to provide us with your name, name of your surgeon, type of surgery, and date of procedure or surgery.  *Opioid medications include: morphine, codeine, oxycodone, oxymorphone, hydrocodone, hydromorphone, meperidine, tramadol, tapentadol, buprenorphine, fentanyl, methadone. **Benzodiazepine medications include: diazepam (Valium), alprazolam (Xanax), clonazepam (Klonopine), lorazepam (Ativan), clorazepate  (Tranxene), chlordiazepoxide (Librium), estazolam (Prosom), oxazepam (Serax), temazepam (Restoril), triazolam (Halcion) (Last updated: 01/02/2018) ____________________________________________________________________________________________    

## 2019-03-05 NOTE — Progress Notes (Signed)
Pain Management Encounter Note - Virtual Visit via Telephone Telehealth (real-time audio visits between healthcare provider and patient).  Patient's Phone No. & Preferred Pharmacy:  [phone] (home); [phone] (mobile); (Preferred) [phone]  [id number] - [place], [place] - [address] [place] [address] Phone: [phone] Fax: [phone]   Pre-screening note:  Our staff contacted James [name] and offered him an "in person", "face-to-face" appointment versus a telephone encounter. He indicated preferring the telephone encounter, at this time.  Reason for Virtual Visit: COVID-19*  Social distancing based on CDC and AMA recommendations.   I contacted James [name] on 03/05/2019 at 8:09 AM by telephone and clearly identified myself as Dionisio David, NP. I verified that I was speaking with the correct person using two identifiers (Name and date of birth: 29-Nov-1955).  Advanced Informed Consent I sought verbal advanced consent from James [name] for telemedicine interactions and virtual visit. I informed James [name] of the security and privacy concerns, risks, and limitations associated with performing an evaluation and management service by telephone. I also informed James [name] of the availability of "in person" appointments and I informed him of the possibility of a patient responsible charge related to this service. James [name] expressed understanding and agreed to proceed.   Historic Elements   James [name] is a 63 y.o. year old, male patient evaluated today after his last encounter by our practice on 11/10/2018. 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 He also  has a past surgical history that includes Spine surgery; Eye surgery; Fracture surgery (Right); Fracture surgery (Bilateral); Fracture surgery (Bilateral); Joint replacement (Right); Spinal cord stimulator implant; Spinal cord stimulator removal; Patella arthroplasty; Shoulder arthroscopy (Bilateral); Esophagogastroduodenoscopy (egd) with propofol (N/A, 01/20/2016); and Colonoscopy (2009). James [name] has a current medication list which includes the following prescription(s): methotrexate (pf), prednisone, albuterol, azelastine, folic acid, loratadine, methotrexate, metoprolol tartrate, naloxone, oxycodone hcl, oxycodone hcl, oxycodone hcl, testosterone cypionate, and tizanidine. He  reports that he has quit smoking. His smoking use included cigarettes. His smokeless tobacco use includes chew. He reports that he does not drink alcohol or use drugs. James [name] is allergic to penicillin g; penicillins; lactose; nsaids; procaine; sulfasalazine; talwin [pentazocine]; codeine; and sulfa antibiotics.   HPI  I last saw him on 11/10/2018. He is being evaluated for medication management. He has 7/10 in lower back and neck pain. He has pain that goes down in his leg. He does get some arm pain. He has numbness on his right hand fingers 4 and 5. He does have weakness in his right leg. He feels like his pain is slowly getting worse. He has been started on prednisone 5 mg for his sarcoidosis. He feels like this is a "miracle drug". He denies any new pain related concerns today. He denies any side effects of his current medication.    Pharmacotherapy Assessment  Analgesic: oxycodone IR 20 mg every 8hours (60 mg/day) MME/day:120 mg/day    Monitoring: Pharmacotherapy: No side-effects or adverse reactions reported. [place] PMP: PDMP not reviewed this encounter.       Compliance: No problems identified. Plan:  Refer to "POC".  Review of recent tests  MR CERVICAL SPINE WO CONTRAST CLINICAL DATA:  Neck pain worse on the right with radiculopathy. Numbness in the arms and hands with worsening symptoms in the past 6 months.  EXAM: MRI CERVICAL SPINE WITHOUT CONTRAST  TECHNIQUE: Multiplanar, multisequence MR imaging of the cervical spine was performed. No intravenous contrast was administered.  COMPARISON:  12/13/2016  FINDINGS: Alignment: Chronic cervical spine straightening. Unchanged minimal anterolisthesis of C3 on C4.  Vertebrae: No fracture or suspicious osseous lesion. Chronic degenerative endplate changes at A3-5 with decreased degenerative endplate edema.  Cord: Normal signal.  Posterior Fossa, vertebral arteries, paraspinal tissues: Unremarkable.  Disc levels:  C2-3: Mild right and moderate to severe left facet arthrosis without stenosis, unchanged.  C3-4: Central disc protrusion, uncovertebral spurring, and severe right and moderate left facet arthrosis result in borderline spinal stenosis with slight ventral cord flattening and moderate right neural foraminal stenosis, unchanged.  C4-5: Minimal disc bulging, mild uncovertebral spurring, and moderate right facet arthrosis without stenosis, unchanged.  C5-6: Moderate disc space narrowing. Disc bulging, uncovertebral spurring, and moderate right and mild left facet arthrosis result in mild spinal stenosis and moderate right greater than left neural foraminal stenosis, unchanged.  C6-7: Severe disc space narrowing. Broad-based posterior disc osteophyte complex results in mild spinal stenosis and mild-to-moderate right and borderline left neural foraminal stenosis, unchanged.  C7-T1: Minimal disc bulging and moderate to severe right and mild left  facet arthrosis result in mild right neural foraminal stenosis without spinal stenosis, unchanged.  IMPRESSION: 1. Decreased degenerative endplate edema at T7-3. 2. Otherwise unchanged appearance of the cervical spine with up to mild spinal and moderate neural foraminal stenosis at multiple levels as above.  Electronically Signed   By: Logan Bores M.D.   On: 09/19/2018 10:30   Clinical Support on 11/10/2018  Component Date Value Ref Range Status  . Summary 11/10/2018 FINAL   Final   Comment: ==================================================================== TOXASSURE SELECT 13 (MW) ==================================================================== Test                             Result       Flag       Units Drug Present and Declared for Prescription Verification   Oxycodone                      2457         EXPECTED   ng/mg creat   Oxymorphone                    2404         EXPECTED   ng/mg creat   Noroxycodone                   >3937        EXPECTED   ng/mg creat   Noroxymorphone                 1228         EXPECTED   ng/mg creat    Sources of oxycodone are scheduled prescription medications.    Oxymorphone, noroxycodone, and noroxymorphone are expected    metabolites of oxycodone. Oxymorphone is also available as a    scheduled prescription medication. ==================================================================== Test                      Result    Flag   Units  Ref Range   Creatinine              254              mg/dL      >=20 ======                          ============================================================== Declared Medications:  The flagging and interpretation on this report are based on the  following declared medications.  Unexpected results may arise from  inaccuracies in the declared medications.  **Note: The testing scope of this panel includes these medications:  Oxycodone  **Note: The testing scope of this panel does not include  following  reported medications:  Albuterol  Azelastine (Astelin)  Folic acid (Folvite)  Loratadine (Claritin)  Methotrexate  Metoprolol (Lopressor)  Naloxone (Narcan)  Tizanidine (Zanaflex) ==================================================================== For clinical consultation, please call [phone]. ====================================================================    Assessment  The primary encounter diagnosis was Lumbar spondylosis. Diagnoses of Chronic pain syndrome, Cervical spondylosis (C7 intravertebral body cyst), and Chronic sacroiliac joint pain (Bilateral) (R>L) were also pertinent to this visit.  Plan of Care  I have changed Gilford Rile. [place]'s Oxycodone HCl, Oxycodone HCl, and Oxycodone HCl. I am also having him maintain his albuterol, methotrexate, tiZANidine, metoprolol tartrate, azelastine, folic acid, naloxone, loratadine, predniSONE, testosterone cypionate, and methotrexate (PF).  Pharmacotherapy (Medications Ordered): Meds ordered this encounter  Medications  . Oxycodone HCl 20 MG TABS    Sig: Take 1 tablet (20 mg total) by mouth every 8 (eight) hours as needed for up to 30 days.    Dispense:  90 tablet    Refill:  0    Do not place this medication, or any other prescription from our practice, on "Automatic Refill". Patient may have prescription filled one day early if pharmacy is closed on scheduled refill date.    Order Specific Question:   Supervising Provider    Answer:   Milinda Pointer [phone]  . Oxycodone HCl 20 MG TABS    Sig: Take 1 tablet (20 mg total) by mouth every 8 (eight) hours as needed for up to 30 days.    Dispense:  90 tablet    Refill:  0    Do not place this medication, or any other prescription from our practice, on "Automatic Refill". Patient may have prescription filled one day early if pharmacy is closed on scheduled refill date.    Order Specific Question:   Supervising Provider    Answer:   Milinda Pointer  [phone]  . Oxycodone HCl 20 MG TABS    Sig: Take 1 tablet (20 mg total) by mouth every 8 (eight) hours for 30 days.    Dispense:  90 tablet    Refill:  0    Do not place this medication, or any other prescription from our practice, on "Automatic Refill". Patient may have prescription filled one day early if pharmacy is closed on scheduled refill date.    Order Specific Question:   Supervising Provider    Answer:   Milinda Pointer [phone]   Orders:  No orders of the defined types were placed in this encounter.  Follow-up plan:   Return in about 3 months (around 06/04/2019) for MedMgmt.   I discussed the assessment and treatment plan with the patient. The patient was provided an opportunity to ask questions and all were answered. The patient agreed with the plan and demonstrated an understanding of the instructions.  Patient advised to  call back or seek an in-person evaluation if the symptoms or condition worsens.  Total duration of non-face-to-face encounter: 11 minutes.  Note by: Dionisio David, NP Date: 03/05/2019; Time: 8:27 AM  Disclaimer:  * Given the special circumstances of the COVID-19 pandemic, the federal government has announced that the Office for Civil Rights (OCR) will exercise its enforcement discretion and will not impose penalties on physicians using telehealth in the event of noncompliance with regulatory requirements under the [place] (HIPAA) in connection with the good faith provision of telehealth during the WTUUE-28 national public health emergency. ([place])

## 2019-03-13 DIAGNOSIS — Z79899 Other long term (current) drug therapy: Secondary | ICD-10-CM | POA: Diagnosis not present

## 2019-03-13 DIAGNOSIS — E349 Endocrine disorder, unspecified: Secondary | ICD-10-CM | POA: Diagnosis not present

## 2019-03-13 DIAGNOSIS — M1990 Unspecified osteoarthritis, unspecified site: Secondary | ICD-10-CM | POA: Diagnosis not present

## 2019-03-19 DIAGNOSIS — M1990 Unspecified osteoarthritis, unspecified site: Secondary | ICD-10-CM | POA: Diagnosis not present

## 2019-03-19 DIAGNOSIS — M8949 Other hypertrophic osteoarthropathy, multiple sites: Secondary | ICD-10-CM | POA: Diagnosis not present

## 2019-03-19 DIAGNOSIS — Z79899 Other long term (current) drug therapy: Secondary | ICD-10-CM | POA: Diagnosis not present

## 2019-04-27 DIAGNOSIS — J449 Chronic obstructive pulmonary disease, unspecified: Secondary | ICD-10-CM | POA: Diagnosis not present

## 2019-04-27 DIAGNOSIS — K219 Gastro-esophageal reflux disease without esophagitis: Secondary | ICD-10-CM | POA: Diagnosis not present

## 2019-04-27 DIAGNOSIS — M7732 Calcaneal spur, left foot: Secondary | ICD-10-CM | POA: Diagnosis not present

## 2019-04-27 DIAGNOSIS — Z114 Encounter for screening for human immunodeficiency virus [HIV]: Secondary | ICD-10-CM | POA: Diagnosis not present

## 2019-04-27 DIAGNOSIS — M19041 Primary osteoarthritis, right hand: Secondary | ICD-10-CM | POA: Diagnosis not present

## 2019-04-27 DIAGNOSIS — I7300 Raynaud's syndrome without gangrene: Secondary | ICD-10-CM | POA: Diagnosis not present

## 2019-04-27 DIAGNOSIS — M2550 Pain in unspecified joint: Secondary | ICD-10-CM | POA: Diagnosis not present

## 2019-04-27 DIAGNOSIS — R29898 Other symptoms and signs involving the musculoskeletal system: Secondary | ICD-10-CM | POA: Diagnosis not present

## 2019-04-27 DIAGNOSIS — I10 Essential (primary) hypertension: Secondary | ICD-10-CM | POA: Diagnosis not present

## 2019-04-27 DIAGNOSIS — M7731 Calcaneal spur, right foot: Secondary | ICD-10-CM | POA: Diagnosis not present

## 2019-04-27 DIAGNOSIS — M19042 Primary osteoarthritis, left hand: Secondary | ICD-10-CM | POA: Diagnosis not present

## 2019-04-27 DIAGNOSIS — G4733 Obstructive sleep apnea (adult) (pediatric): Secondary | ICD-10-CM | POA: Diagnosis not present

## 2019-04-27 DIAGNOSIS — Z87891 Personal history of nicotine dependence: Secondary | ICD-10-CM | POA: Diagnosis not present

## 2019-04-27 DIAGNOSIS — M533 Sacrococcygeal disorders, not elsewhere classified: Secondary | ICD-10-CM | POA: Diagnosis not present

## 2019-04-27 DIAGNOSIS — M7989 Other specified soft tissue disorders: Secondary | ICD-10-CM | POA: Diagnosis not present

## 2019-04-27 DIAGNOSIS — R21 Rash and other nonspecific skin eruption: Secondary | ICD-10-CM | POA: Diagnosis not present

## 2019-04-27 DIAGNOSIS — E039 Hypothyroidism, unspecified: Secondary | ICD-10-CM | POA: Diagnosis not present

## 2019-04-27 DIAGNOSIS — M5136 Other intervertebral disc degeneration, lumbar region: Secondary | ICD-10-CM | POA: Diagnosis not present

## 2019-04-27 DIAGNOSIS — Z1159 Encounter for screening for other viral diseases: Secondary | ICD-10-CM | POA: Diagnosis not present

## 2019-05-26 ENCOUNTER — Encounter: Payer: Self-pay | Admitting: Pain Medicine

## 2019-05-26 DIAGNOSIS — Z789 Other specified health status: Secondary | ICD-10-CM | POA: Insufficient documentation

## 2019-05-26 DIAGNOSIS — Z79899 Other long term (current) drug therapy: Secondary | ICD-10-CM | POA: Insufficient documentation

## 2019-05-26 DIAGNOSIS — M899 Disorder of bone, unspecified: Secondary | ICD-10-CM | POA: Insufficient documentation

## 2019-05-26 NOTE — Patient Instructions (Addendum)
____________________________________________________________________________________________  Medication Rules  Purpose: To inform patients, and their family members, of our rules and regulations.  Applies to: All patients receiving prescriptions (written or electronic).  Pharmacy of record: Pharmacy where electronic prescriptions will be sent. If written prescriptions are taken to a different pharmacy, please inform the nursing staff. The pharmacy listed in the electronic medical record should be the one where you would like electronic prescriptions to be sent.  Electronic prescriptions: In compliance with the [place] Strengthen Opioid Misuse Prevention (STOP) Act of 2017 (Session Law 2017-74/H243), effective November 05, 2018, all controlled substances must be electronically prescribed. Calling prescriptions to the pharmacy will cease to exist.  Prescription refills: Only during scheduled appointments. Applies to all prescriptions.  NOTE: The following applies primarily to controlled substances (Opioid* Pain Medications).   Patient's responsibilities: 1. Pain Pills: Bring all pain pills to every appointment (except for procedure appointments). 2. Pill Bottles: Bring pills in original pharmacy bottle. Always bring the newest bottle. Bring bottle, even if empty. 3. Medication refills: You are responsible for knowing and keeping track of what medications you take and those you need refilled. The day before your appointment: write a list of all prescriptions that need to be refilled. The day of the appointment: give the list to the admitting nurse. Prescriptions will be written only during appointments. No prescriptions will be written on procedure days. If you forget a medication: it will not be "Called in", "Faxed", or "electronically sent". You will need to get another appointment to get these prescribed. No early refills. Do not call asking to have your prescription filled  early. 4. Prescription Accuracy: You are responsible for carefully inspecting your prescriptions before leaving our office. Have the discharge nurse carefully go over each prescription with you, before taking them home. Make sure that your name is accurately spelled, that your address is correct. Check the name and dose of your medication to make sure it is accurate. Check the number of pills, and the written instructions to make sure they are clear and accurate. Make sure that you are given enough medication to last until your next medication refill appointment. 5. Taking Medication: Take medication as prescribed. When it comes to controlled substances, taking less pills or less frequently than prescribed is permitted and encouraged. Never take more pills than instructed. Never take medication more frequently than prescribed.  6. Inform other Doctors: Always inform, all of your healthcare providers, of all the medications you take. 7. Pain Medication from other Providers: You are not allowed to accept any additional pain medication from any other Doctor or Healthcare provider. There are two exceptions to this rule. (see below) In the event that you require additional pain medication, you are responsible for notifying us, as stated below. 8. Medication Agreement: You are responsible for carefully reading and following our Medication Agreement. This must be signed before receiving any prescriptions from our practice. Safely store a copy of your signed Agreement. Violations to the Agreement will result in no further prescriptions. (Additional copies of our Medication Agreement are available upon request.) 9. Laws, Rules, & Regulations: All patients are expected to follow all Federal and State Laws, Statutes, Rules, & Regulations. Ignorance of the Laws does not constitute a valid excuse. The use of any illegal substances is prohibited. 10. Adopted CDC guidelines & recommendations: Target dosing levels will be  at or below 60 MME/day. Use of benzodiazepines** is not recommended.  Exceptions: There are only two exceptions to the rule of not   receiving pain medications from other Healthcare Providers. 1. Exception #1 (Emergencies): In the event of an emergency (i.e.: accident requiring emergency care), you are allowed to receive additional pain medication. However, you are responsible for: As soon as you are able, call our office [phone], at any time of the day or night, and leave a message stating your name, the date and nature of the emergency, and the name and dose of the medication prescribed. In the event that your call is answered by a member of our staff, make sure to document and save the date, time, and the name of the person that took your information.  2. Exception #2 (Planned Surgery): In the event that you are scheduled by another doctor or dentist to have any type of surgery or procedure, you are allowed (for a period no longer than 30 days), to receive additional pain medication, for the acute post-op pain. However, in this case, you are responsible for picking up a copy of our "Post-op Pain Management for Surgeons" handout, and giving it to your surgeon or dentist. This document is available at our office, and does not require an appointment to obtain it. Simply go to our office during business hours (Monday-Thursday from 8:00 AM to 4:00 PM) (Friday 8:00 AM to 12:00 Noon) or if you have a scheduled appointment with us, prior to your surgery, and ask for it by name. In addition, you will need to provide us with your name, name of your surgeon, type of surgery, and date of procedure or surgery.  *Opioid medications include: morphine, codeine, oxycodone, oxymorphone, hydrocodone, hydromorphone, meperidine, tramadol, tapentadol, buprenorphine, fentanyl, methadone. **Benzodiazepine medications include: diazepam (Valium), alprazolam (Xanax), clonazepam (Klonopine), lorazepam (Ativan), clorazepate  (Tranxene), chlordiazepoxide (Librium), estazolam (Prosom), oxazepam (Serax), temazepam (Restoril), triazolam (Halcion) (Last updated: 01/02/2018) ____________________________________________________________________________________________   ____________________________________________________________________________________________  Medication Recommendations and Reminders  Applies to: All patients receiving prescriptions (written and/or electronic).  Medication Rules & Regulations: These rules and regulations exist for your safety and that of others. They are not flexible and neither are we. Dismissing or ignoring them will be considered "non-compliance" with medication therapy, resulting in complete and irreversible termination of such therapy. (See document titled "Medication Rules" for more details.) In all conscience, because of safety reasons, we cannot continue providing a therapy where the patient does not follow instructions.  Pharmacy of record:   Definition: This is the pharmacy where your electronic prescriptions will be sent.   We do not endorse any particular pharmacy.  You are not restricted in your choice of pharmacy.  The pharmacy listed in the electronic medical record should be the one where you want electronic prescriptions to be sent.  If you choose to change pharmacy, simply notify our nursing staff of your choice of new pharmacy.  Recommendations:  Keep all of your pain medications in a safe place, under lock and key, even if you live alone.   After you fill your prescription, take 1 week's worth of pills and put them away in a safe place. You should keep a separate, properly labeled bottle for this purpose. The remainder should be kept in the original bottle. Use this as your primary supply, until it runs out. Once it's gone, then you know that you have 1 week's worth of medicine, and it is time to come in for a prescription refill. If you do this correctly, it  is unlikely that you will ever run out of medicine.  To make sure that the above recommendation works,   it is very important that you make sure your medication refill appointments are scheduled at least 1 week before you run out of medicine. To do this in an effective manner, make sure that you do not leave the office without scheduling your next medication management appointment. Always ask the nursing staff to show you in your prescription , when your medication will be running out. Then arrange for the receptionist to get you a return appointment, at least 7 days before you run out of medicine. Do not wait until you have 1 or 2 pills left, to come in. This is very poor planning and does not take into consideration that we may need to cancel appointments due to bad weather, sickness, or emergencies affecting our staff.  "Partial Fill": If for any reason your pharmacy does not have enough pills/tablets to completely fill or refill your prescription, do not allow for a "partial fill". You will need a separate prescription to fill the remaining amount, which we will not provide. If the reason for the partial fill is your insurance, you will need to talk to the pharmacist about payment alternatives for the remaining tablets, but again, do not accept a partial fill.  Prescription refills and/or changes in medication(s):   Prescription refills, and/or changes in dose or medication, will be conducted only during scheduled medication management appointments. (Applies to both, written and electronic prescriptions.)  No refills on procedure days. No medication will be changed or started on procedure days. No changes, adjustments, and/or refills will be conducted on a procedure day. Doing so will interfere with the diagnostic portion of the procedure.  No phone refills. No medications will be "called into the pharmacy".  No Fax refills.  No weekend refills.  No Holliday refills.  No after hours  refills.  Remember:  Business hours are:  Monday to Thursday 8:00 AM to 4:00 PM Provider's Schedule: Crystal King, NP - Appointments are:  Medication management: Monday to Thursday 8:00 AM to 4:00 PM Fia Hebert, MD - Appointments are:  Medication management: Monday and Wednesday 8:00 AM to 4:00 PM Procedure day: Tuesday and Thursday 7:30 AM to 4:00 PM Bilal Lateef, MD - Appointments are:  Medication management: Tuesday and Thursday 8:00 AM to 4:00 PM Procedure day: Monday and Wednesday 7:30 AM to 4:00 PM (Last update: 01/02/2018) ____________________________________________________________________________________________   ____________________________________________________________________________________________  CANNABIDIOL (AKA: CBD Oil or Pills)  Applies to: All patients receiving prescriptions of controlled substances (written and/or electronic).  General Information: Cannabidiol (CBD) was discovered in 1940. It is one of some 113 identified cannabinoids in cannabis (Marijuana) plants, accounting for up to 40% of the plant's extract. As of 2018, preliminary clinical research on cannabidiol included studies of anxiety, cognition, movement disorders, and pain.  Cannabidiol is consummed in multiple ways, including inhalation of cannabis smoke or vapor, as an aerosol spray into the cheek, and by mouth. It may be supplied as CBD oil containing CBD as the active ingredient (no added tetrahydrocannabinol (THC) or terpenes), a full-plant CBD-dominant hemp extract oil, capsules, dried cannabis, or as a liquid solution. CBD is thought not have the same psychoactivity as THC, and may affect the actions of THC. Studies suggest that CBD may interact with different biological targets, including cannabinoid receptors and other neurotransmitter receptors. As of 2018 the mechanism of action for its biological effects has not been determined.  In the [place], cannabidiol has a limited  approval by the Food and Drug Administration (FDA) for treatment of only two types   of epilepsy disorders. The side effects of long-term use of the drug include somnolence, decreased appetite, diarrhea, fatigue, malaise, weakness, sleeping problems, and others.  CBD remains a Schedule I drug prohibited for any use.  Legality: Some manufacturers ship CBD products nationally, an illegal action which the FDA has not enforced in 2018, with CBD remaining the subject of an FDA investigational new drug evaluation, and is not considered legal as a dietary supplement or food ingredient as of December 2018. Federal illegality has made it difficult historically to conduct research on CBD. CBD is openly sold in head shops and health food stores in some states where such sales have not been explicitly legalized.  Warning: Because it is not FDA approved for general use or treatment of pain, it is not required to undergo the same manufacturing controls as prescription drugs.  This means that the available cannabidiol (CBD) may be contaminated with THC.  If this is the case, it will trigger a positive urine drug screen (UDS) test for cannabinoids (Marijuana).  Because a positive UDS for illicit substances is a violation of our medication agreement, your opioid analgesics (pain medicine) may be permanently discontinued. (Last update: 01/23/2018) ____________________________________________________________________________________________   ____________________________________________________________________________________________  Drug Holidays (Slow)  What is a "Drug Holiday"? Drug Holiday: is the name given to the period of time during which a patient stops taking a medication(s) for the purpose of eliminating tolerance to the drug.  Benefits . Improved effectiveness of opioids. . Decreased opioid dose needed to achieve benefits. . Improved pain with lesser dose.  What is tolerance? Tolerance: is the  progressive decreased in effectiveness of a drug due to its repetitive use. With repetitive use, the body gets use to the medication and as a consequence, it loses its effectiveness. This is a common problem seen with opioid pain medications. As a result, a larger dose of the drug is needed to achieve the same effect that used to be obtained with a smaller dose.  How long should a "Drug Holiday" last? You should stay off of the pain medicine for at least 14 consecutive days. (2 weeks)  Should I stop the medicine "cold [place]"? No. You should always coordinate with your Pain Specialist so that he/she can provide you with the correct medication dose to make the transition as smoothly as possible.  How do I stop the medicine? Slowly. You will be instructed to decrease the daily amount of pills that you take by one (1) pill every seven (7) days. This is called a "slow downward taper" of your dose. For example: if you normally take four (4) pills per day, you will be asked to drop this dose to three (3) pills per day for seven (7) days, then to two (2) pills per day for seven (7) days, then to one (1) per day for seven (7) days, and at the end of those last seven (7) days, this is when the "Drug Holiday" would start.   Will I have withdrawals? By doing a "slow downward taper" like this one, it is unlikely that you will experience any significant withdrawal symptoms. Typically, what triggers withdrawals is the sudden stop of a high dose opioid therapy. Withdrawals can usually be avoided by slowly decreasing the dose over a prolonged period of time.  What are withdrawals? Withdrawals: refers to the wide range of symptoms that occur after stopping or dramatically reducing opiate drugs after heavy and prolonged use. Withdrawal symptoms do not occur to patients that use low dose  opioids, or those who take the medication sporadically. Contrary to benzodiazepine (example: Valium, Xanax, etc.) or alcohol  withdrawals ("Delirium Tremens"), opioid withdrawals are not lethal. Withdrawals are the physical manifestation of the body getting rid of the excess receptors.  Expected Symptoms Early symptoms of withdrawal may include: . Agitation . Anxiety . Muscle aches . Increased tearing . Insomnia . Runny nose . Sweating . Yawning  Late symptoms of withdrawal may include: . Abdominal cramping . Diarrhea . Dilated pupils . Goose bumps . Nausea . Vomiting  Will I experience withdrawals? Due to the slow nature of the taper, it is very unlikely that you will experience any.  What is a slow taper? Taper: refers to the gradual decrease in dose.  ___________________________________________________________________________________________    ____________________________________________________________________________________________  Preparing for Procedure with Sedation  Procedure appointments are limited to planned procedures: . No Prescription Refills. . No disability issues will be discussed. . No medication changes will be discussed.  Instructions: . Oral Intake: Do not eat or drink anything for at least 8 hours prior to your procedure. . Transportation: Public transportation is not allowed. Bring an adult driver. The driver must be physically present in our waiting room before any procedure can be started. Marland Kitchen Physical Assistance: Bring an adult physically capable of assisting you, in the event you need help. This adult should keep you company at home for at least 6 hours after the procedure. . Blood Pressure Medicine: Take your blood pressure medicine with a sip of water the morning of the procedure. . Blood thinners: Notify our staff if you are taking any blood thinners. Depending on which one you take, there will be specific instructions on how and when to stop it. . Diabetics on insulin: Notify the staff so that you can be scheduled 1st case in the morning. If your diabetes  requires high dose insulin, take only  of your normal insulin dose the morning of the procedure and notify the staff that you have done so. . Preventing infections: Shower with an antibacterial soap the morning of your procedure. . Build-up your immune system: Take 1000 mg of Vitamin C with every meal (3 times a day) the day prior to your procedure. Marland Kitchen Antibiotics: Inform the staff if you have a condition or reason that requires you to take antibiotics before dental procedures. . Pregnancy: If you are pregnant, call and cancel the procedure. . Sickness: If you have a cold, fever, or any active infections, call and cancel the procedure. . Arrival: You must be in the facility at least 30 minutes prior to your scheduled procedure. . Children: Do not bring children with you. . Dress appropriately: Bring dark clothing that you would not mind if they get stained. . Valuables: Do not bring any jewelry or valuables.  Reasons to call and reschedule or cancel your procedure: (Following these recommendations will minimize the risk of a serious complication.) . Surgeries: Avoid having procedures within 2 weeks of any surgery. (Avoid for 2 weeks before or after any surgery). . Flu Shots: Avoid having procedures within 2 weeks of a flu shots or . (Avoid for 2 weeks before or after immunizations). . Barium: Avoid having a procedure within 7-10 days after having had a radiological study involving the use of radiological contrast. (Myelograms, Barium swallow or enema study). . Heart attacks: Avoid any elective procedures or surgeries for the initial 6 months after a "Myocardial Infarction" (Heart Attack). . Blood thinners: It is imperative that you stop these medications before  procedures. Let us know if you if you take any blood thinner.  . Infection: Avoid procedures during or within two weeks of an infection (including chest colds or gastrointestinal problems). Symptoms associated with infections include:  Localized redness, fever, chills, night sweats or profuse sweating, burning sensation when voiding, cough, congestion, stuffiness, runny nose, sore throat, diarrhea, nausea, vomiting, cold or Flu symptoms, recent or current infections. It is specially important if the infection is over the area that we intend to treat. Marland Kitchen Heart and lung problems: Symptoms that may suggest an active cardiopulmonary problem include: cough, chest pain, breathing difficulties or shortness of breath, dizziness, ankle swelling, uncontrolled high or unusually low blood pressure, and/or palpitations. If you are experiencing any of these symptoms, cancel your procedure and contact your primary care physician for an evaluation.  Remember:  Regular Business hours are:  Monday to Thursday 8:00 AM to 4:00 PM  Provider's Schedule: Milinda Pointer, MD:  Procedure days: Tuesday and Thursday 7:30 AM to 4:00 PM  Gillis Santa, MD:  Procedure days: Monday and Wednesday 7:30 AM to 4:00 PM ____________________________________________________________________________________________

## 2019-05-26 NOTE — Progress Notes (Signed)
Pain Management Virtual Encounter Note - Virtual Visit via Telephone Telehealth (real-time audio visits between healthcare provider and patient).   Patient's Phone No. & Preferred Pharmacy:  [phone] (home); [phone] (mobile); (Preferred) [phone] [email or web address]  [id number] [place] [address], [place] - [address] [address] [address] [address] [place] [address] Phone: [phone] Fax: [phone]    Pre-screening note:  Our staff contacted James [name] and offered him an "in person", "face-to-face" appointment versus a telephone encounter. He indicated preferring the telephone encounter, at this time.   Reason for Virtual Visit: COVID-19*  Social distancing based on CDC and AMA recommendations.   I contacted James [name] on 05/27/2019 via telephone.      I clearly identified myself as Gaspar Cola, MD. I verified that I was speaking with the correct person using two identifiers (Name: James [name], and date of birth: 1959-08-16).  Advanced Informed Consent I sought verbal advanced consent from James [name] for virtual visit interactions. I informed James [name] of possible security and privacy concerns, risks, and limitations associated with providing "not-in-person" medical evaluation and management services. I also informed James [name] of the availability of "in-person" appointments. Finally, I informed him that there would be a charge for the virtual visit and that he could be  personally, fully or partially, financially responsible for it. James [name] expressed understanding and agreed to proceed.   Historic Elements   James [name] is a 63 y.o. year old, male patient evaluated today after his last encounter by our practice on 03/05/2019. 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 He also  has a past surgical history that includes Spine surgery; Eye surgery; Fracture surgery (Right); Fracture surgery (Bilateral); Fracture surgery (Bilateral); Joint replacement (Right); Spinal cord stimulator implant; Spinal cord stimulator removal; Patella arthroplasty; Shoulder arthroscopy (Bilateral); Esophagogastroduodenoscopy (egd) with propofol (N/A, 01/20/2016); and Colonoscopy (2009). James [name] has a current medication list which includes the following prescription(s): albuterol, azelastine, b complex vitamins, folic acid, loratadine, methotrexate (pf), metoprolol tartrate, naloxone, oxycodone hcl, oxycodone hcl, oxycodone hcl, prednisone, and tizanidine. He  reports that he has quit smoking. His smoking use included cigarettes. His smokeless tobacco use includes chew. He reports that he does not drink alcohol or use drugs. James [name] is allergic to penicillin g; penicillins; lactose; nsaids; procaine; sulfasalazine; talwin [pentazocine]; codeine; and sulfa antibiotics.   HPI  Today, he is being contacted for medication management.  The patient indicates having no problems with the medications.  He was recently started on prednisone 5 mg daily and this apparently has done miracles for him.  The only thing that he complains of is some pain in the area of the neck.  He has requested that we consider repeating his cervical epidural steroid injection.  I have double check the records and it is been a long time since he had 1 and therefore I will have any problems with it.  I asked him if he needed to have 1 done right  away but he indicated that he can probably wait a little longer but he feels he will  need one soon.  I will put in a PRN order so that he can activate it whenever he feels this time.  Pharmacotherapy Assessment  Analgesic: Oxycodone IR 20 mg, 1 tab PO q 8hrs (60 mg/day of oxycodone) MME/day:120 mg/day.   Monitoring: Pharmacotherapy: No side-effects or adverse reactions reported. [place] PMP: PDMP reviewed during this encounter.       Compliance: No problems identified. Effectiveness: Clinically acceptable. Plan: Refer to "POC".  Pertinent Labs   SAFETY SCREENING Profile No results found for: SARSCOV2NAA, COVIDSOURCE, STAPHAUREUS, MRSAPCR, HCVAB, HIV, PREGTESTUR Renal Function Lab Results  Component Value Date   BUN 14 12/06/2016   CREATININE 1.20 01/31/2018   GFRAA >60 12/06/2016   GFRNONAA >60 12/06/2016   Hepatic Function Lab Results  Component Value Date   AST 35 12/06/2016   ALT 33 12/06/2016   ALBUMIN 4.4 12/06/2016   UDS Summary  Date Value Ref Range Status  11/10/2018 FINAL  Final    Comment:    ==================================================================== TOXASSURE SELECT 13 (MW) ==================================================================== Test                             Result       Flag       Units Drug Present and Declared for Prescription Verification   Oxycodone                      2457         EXPECTED   ng/mg creat   Oxymorphone                    2404         EXPECTED   ng/mg creat   Noroxycodone                   >3937        EXPECTED   ng/mg creat   Noroxymorphone                 1228         EXPECTED   ng/mg creat    Sources of oxycodone are scheduled prescription medications.    Oxymorphone, noroxycodone, and noroxymorphone are expected    metabolites of oxycodone. Oxymorphone is also available as a    scheduled prescription medication. ==================================================================== Test                       Result    Flag   Units      Ref Range   Creatinine              254              mg/dL      >=20 ==================================================================== Declared Medications:  The flagging and interpretation on this report are based on the  following declared medications.  Unexpected results may arise from  inaccuracies in the declared medications.  **Note: The testing scope of this panel includes these medications:  Oxycodone  **Note: The testing scope of this panel does not include following  reported medications:  Albuterol  Azelastine (Astelin)  Folic acid (Folvite)  Loratadine (Claritin)  Methotrexate  Metoprolol (Lopressor)  Naloxone (Narcan)  Tizanidine (Zanaflex) ==================================================================== For clinical consultation, please call [phone]. ====================================================================    Note: Above Lab results reviewed.  Recent imaging  MR CERVICAL SPINE WO CONTRAST CLINICAL DATA:  Neck pain worse on the right with radiculopathy.  Numbness in the arms and hands with worsening symptoms in the past 6 months.  EXAM: MRI CERVICAL SPINE WITHOUT CONTRAST  TECHNIQUE: Multiplanar, multisequence MR imaging of the cervical spine was performed. No intravenous contrast was administered.  COMPARISON:  12/13/2016  FINDINGS: Alignment: Chronic cervical spine straightening. Unchanged minimal anterolisthesis of C3 on C4.  Vertebrae: No fracture or suspicious osseous lesion. Chronic degenerative endplate changes at E3-1 with decreased degenerative endplate edema.  Cord: Normal signal.  Posterior Fossa, vertebral arteries, paraspinal tissues: Unremarkable.  Disc levels:  C2-3: Mild right and moderate to severe left facet arthrosis without stenosis, unchanged.  C3-4: Central disc protrusion, uncovertebral spurring, and severe right and moderate left facet arthrosis result in borderline  spinal stenosis with slight ventral cord flattening and moderate right neural foraminal stenosis, unchanged.  C4-5: Minimal disc bulging, mild uncovertebral spurring, and moderate right facet arthrosis without stenosis, unchanged.  C5-6: Moderate disc space narrowing. Disc bulging, uncovertebral spurring, and moderate right and mild left facet arthrosis result in mild spinal stenosis and moderate right greater than left neural foraminal stenosis, unchanged.  C6-7: Severe disc space narrowing. Broad-based posterior disc osteophyte complex results in mild spinal stenosis and mild-to-moderate right and borderline left neural foraminal stenosis, unchanged.  C7-T1: Minimal disc bulging and moderate to severe right and mild left facet arthrosis result in mild right neural foraminal stenosis without spinal stenosis, unchanged.  IMPRESSION: 1. Decreased degenerative endplate edema at V4-0. 2. Otherwise unchanged appearance of the cervical spine with up to mild spinal and moderate neural foraminal stenosis at multiple levels as above.  Electronically Signed   By: Logan Bores M.D.   On: 09/19/2018 10:30  Assessment  The primary encounter diagnosis was Chronic pain syndrome. Diagnoses of Cervicalgia (Right), Chronic cervical radicular pain (Right), Cervical spondylosis (C7 intravertebral body cyst), DDD (degenerative disc disease), cervical, Chronic low back pain (Primary Area of Pain) (Bilateral) (R>L), Chronic neck pain (Right), Myofascial pain syndrome, cervical (rhomboid muscles) (intermittent), Pharmacologic therapy, Disorder of skeletal system, Problems influencing health status, and Chronic musculoskeletal pain were also pertinent to this visit.  Plan of Care  I have discontinued Gilford Rile. Beg's methotrexate, testosterone cypionate, Oxycodone HCl, and Oxycodone HCl. I have also changed his Oxycodone HCl and tiZANidine. Additionally, I am having him start on Oxycodone HCl and  Oxycodone HCl. Lastly, I am having him maintain his albuterol, metoprolol tartrate, azelastine, folic acid, naloxone, loratadine, predniSONE, methotrexate (PF), and b complex vitamins.  Pharmacotherapy (Medications Ordered): Meds ordered this encounter  Medications  . Oxycodone HCl 20 MG TABS    Sig: Take 1 tablet (20 mg total) by mouth every 8 (eight) hours as needed. Must last 30 days    Dispense:  90 tablet    Refill:  0    Chronic Pain: STOP Act (Not applicable) Fill 1 day early if closed on refill date. Do not fill until: 06/09/2019. To last until: 07/09/2019. Avoid benzodiazepines within 8 hours of opioids  . Oxycodone HCl 20 MG TABS    Sig: Take 1 tablet (20 mg total) by mouth every 8 (eight) hours as needed. Must last 30 days    Dispense:  90 tablet    Refill:  0    Chronic Pain: STOP Act (Not applicable) Fill 1 day early if closed on refill date. Do not fill until: 07/09/2019. To last until: 08/08/2019. Avoid benzodiazepines within 8 hours of opioids  . Oxycodone HCl 20 MG TABS    Sig: Take 1 tablet (20 mg total) by  mouth every 8 (eight) hours as needed. Must last 30 days    Dispense:  90 tablet    Refill:  0    Chronic Pain: STOP Act (Not applicable) Fill 1 day early if closed on refill date. Do not fill until: 08/08/2019. To last until: 09/07/2019. Avoid benzodiazepines within 8 hours of opioids  . tiZANidine (ZANAFLEX) 4 MG capsule    Sig: Take 1 capsule (4 mg total) by mouth 3 (three) times daily as needed for muscle spasms.    Dispense:  100 capsule    Refill:  2    Fill one day early if pharmacy is closed on scheduled refill date. May substitute for generic if available.   Orders:  Orders Placed This Encounter  Procedures  . Cervical Epidural Injection    Procedure: Cervical Epidural Steroid Injection/Block Purpose: Palliative Indication(s): Radiculitis and/or cervicalgia associater with cervical degenerative disc disease.    Standing Status:   Standing    Number of  Occurrences:   6    Standing Expiration Date:   11/26/2020    Scheduling Instructions:     Level(s): C7-T1     Laterality: Right-sided     Sedation: With Sedation.     Timeframe: PRN    Order Specific Question:   Where will this procedure be performed?    Answer:   [facility]    Comments:   by Dr. Dossie Arbour  . Comp. Metabolic Panel (12)    With GFR. Indications: Chronic Pain Syndrome (G89.4) & Pharmacotherapy (P50.932)    Order Specific Question:   Has the patient fasted?    Answer:   No    Order Specific Question:   CC Results    Answer:   PCP-NURSE [[id number]]  . Magnesium    Indication: Pharmacologic therapy (Y09.983)    Order Specific Question:   CC Results    Answer:   PCP-NURSE [[id number]]  . Vitamin B12    Indication: Pharmacologic therapy (L97.673).    Order Specific Question:   CC Results    Answer:   PCP-NURSE [[id number]]  . Sedimentation rate    Indication: Disorder of skeletal system (M89.9)    Order Specific Question:   CC Results    Answer:   PCP-NURSE [[id number]]  . 25-Hydroxyvitamin D Lcms D2+D3    Indication: Disorder of skeletal system (M89.9).    Order Specific Question:   CC Results    Answer:   PCP-NURSE [[id number]]  . C-reactive protein    Indication: Problems influencing health status (Z78.9)    Order Specific Question:   CC Results    Answer:   PCP-NURSE [[id number]]   Follow-up plan:   Return in about 3 months (around 09/07/2019) for (VV), E/M (MM), in addition, PRN Procedure: (R) CESI (w/ sedation).      Interventional therapies:  Considering:  Possible bilateral lumbar facet RFA  Possible bilateral cervical facet RFA    Palliative PRN treatment(s):  Palliative right CESI Palliative right L4-5 LESI #2  Palliative bilateral lumbar facet blocks Palliative left lumbar facet RFA #2 (Last done 07/18/17) Palliative bilateral cervical facet blocks    Recent Visits Date Type Provider Dept  03/05/19 Office Visit Vevelyn Francois, NP [place] recent visits within past 90 days and meeting all other requirements   Today's Visits Date Type Provider Dept  05/27/19 Office Visit Milinda Pointer, MD [facility]  Showing today's visits and meeting all other requirements   Future Appointments No visits  were found meeting these conditions.  Showing future appointments within next 90 days and meeting all other requirements   I discussed the assessment and treatment plan with the patient. The patient was provided an opportunity to ask questions and all were answered. The patient agreed with the plan and demonstrated an understanding of the instructions.  Patient advised to call back or seek an in-person evaluation if the symptoms or condition worsens.  Total duration of non-face-to-face encounter: 15 minutes.  Note by: Gaspar Cola, MD Date: 05/27/2019; Time: 9:38 AM  Note: This dictation was prepared with Dragon dictation. Any transcriptional errors that may result from this process are unintentional.  Disclaimer:  * Given the special circumstances of the COVID-19 pandemic, the federal government has announced that the Office for Civil Rights (OCR) will exercise its enforcement discretion and will not impose penalties on physicians using telehealth in the event of noncompliance with regulatory requirements under the [place] (HIPAA) in connection with the good faith provision of telehealth during the AXENM-07 national public health emergency. ([place])

## 2019-05-27 ENCOUNTER — Ambulatory Visit: Payer: PPO | Attending: Pain Medicine | Admitting: Pain Medicine

## 2019-05-27 ENCOUNTER — Other Ambulatory Visit: Payer: Self-pay

## 2019-05-27 DIAGNOSIS — M545 Low back pain, unspecified: Secondary | ICD-10-CM

## 2019-05-27 DIAGNOSIS — M542 Cervicalgia: Secondary | ICD-10-CM

## 2019-05-27 DIAGNOSIS — M899 Disorder of bone, unspecified: Secondary | ICD-10-CM

## 2019-05-27 DIAGNOSIS — M5412 Radiculopathy, cervical region: Secondary | ICD-10-CM

## 2019-05-27 DIAGNOSIS — M7918 Myalgia, other site: Secondary | ICD-10-CM | POA: Diagnosis not present

## 2019-05-27 DIAGNOSIS — G8929 Other chronic pain: Secondary | ICD-10-CM

## 2019-05-27 DIAGNOSIS — G894 Chronic pain syndrome: Secondary | ICD-10-CM

## 2019-05-27 DIAGNOSIS — M5030 Other cervical disc degeneration, unspecified cervical region: Secondary | ICD-10-CM | POA: Diagnosis not present

## 2019-05-27 DIAGNOSIS — M47812 Spondylosis without myelopathy or radiculopathy, cervical region: Secondary | ICD-10-CM | POA: Diagnosis not present

## 2019-05-27 DIAGNOSIS — Z79899 Other long term (current) drug therapy: Secondary | ICD-10-CM

## 2019-05-27 DIAGNOSIS — Z789 Other specified health status: Secondary | ICD-10-CM | POA: Diagnosis not present

## 2019-05-27 MED ORDER — OXYCODONE HCL 20 MG PO TABS: 20.0000 mg | ORAL_TABLET | Freq: Three times a day (TID) | ORAL | 0 refills | Status: DC | PRN

## 2019-05-27 MED ORDER — TIZANIDINE HCL 4 MG PO CAPS: 4.0000 mg | ORAL_CAPSULE | Freq: Three times a day (TID) | ORAL | 2 refills | Status: DC | PRN

## 2019-06-01 ENCOUNTER — Ambulatory Visit: Payer: PPO | Admitting: Pain Medicine

## 2019-08-19 ENCOUNTER — Other Ambulatory Visit
Admission: RE | Admit: 2019-08-19 | Discharge: 2019-08-19 | Disposition: A | Discharge status: Home / Self Care | Payer: PPO | Source: Ambulatory Visit | Attending: Pain Medicine | Admitting: Pain Medicine

## 2019-08-19 DIAGNOSIS — M899 Disorder of bone, unspecified: Secondary | ICD-10-CM | POA: Insufficient documentation

## 2019-08-19 DIAGNOSIS — M1990 Unspecified osteoarthritis, unspecified site: Secondary | ICD-10-CM | POA: Diagnosis not present

## 2019-08-19 DIAGNOSIS — Z789 Other specified health status: Secondary | ICD-10-CM | POA: Diagnosis not present

## 2019-08-19 DIAGNOSIS — Z79899 Other long term (current) drug therapy: Secondary | ICD-10-CM | POA: Insufficient documentation

## 2019-08-19 DIAGNOSIS — R0602 Shortness of breath: Secondary | ICD-10-CM | POA: Diagnosis not present

## 2019-08-19 DIAGNOSIS — E119 Type 2 diabetes mellitus without complications: Secondary | ICD-10-CM | POA: Diagnosis not present

## 2019-08-19 DIAGNOSIS — G894 Chronic pain syndrome: Secondary | ICD-10-CM | POA: Diagnosis not present

## 2019-08-19 DIAGNOSIS — I4891 Unspecified atrial fibrillation: Secondary | ICD-10-CM | POA: Diagnosis not present

## 2019-08-19 DIAGNOSIS — G4733 Obstructive sleep apnea (adult) (pediatric): Secondary | ICD-10-CM | POA: Diagnosis not present

## 2019-08-19 DIAGNOSIS — R001 Bradycardia, unspecified: Secondary | ICD-10-CM | POA: Diagnosis not present

## 2019-08-19 LAB — SEDIMENTATION RATE: Sed Rate: 1 mm/hr (ref 0–20)

## 2019-08-19 LAB — COMPREHENSIVE METABOLIC PANEL
ALT: 24 U/L (ref 0–44)
AST: 25 U/L (ref 15–41)
Albumin: 4.2 g/dL (ref 3.5–5.0)
Alkaline Phosphatase: 47 U/L (ref 38–126)
Anion gap: 9 (ref 5–15)
BUN: 15 mg/dL (ref 8–23)
CO2: 29 mmol/L (ref 22–32)
Calcium: 9.3 mg/dL (ref 8.9–10.3)
Chloride: 100 mmol/L (ref 98–111)
Creatinine, Ser: 1.47 mg/dL — ABNORMAL HIGH (ref 0.61–1.24)
GFR calc Af Amer: 59 mL/min — ABNORMAL LOW (ref >60)
GFR calc non Af Amer: 51 mL/min — ABNORMAL LOW (ref >60)
Glucose, Bld: 102 mg/dL — ABNORMAL HIGH (ref 70–99)
Potassium: 4.1 mmol/L (ref 3.5–5.1)
Sodium: 138 mmol/L (ref 135–145)
Total Bilirubin: 1.3 mg/dL — ABNORMAL HIGH (ref 0.3–1.2)
Total Protein: 7.2 g/dL (ref 6.5–8.1)

## 2019-08-19 LAB — VITAMIN D 25 HYDROXY (VIT D DEFICIENCY, FRACTURES): Vit D, 25-Hydroxy: 26.19 ng/mL — ABNORMAL LOW (ref 30–100)

## 2019-08-19 LAB — MAGNESIUM: Magnesium: 2.1 mg/dL (ref 1.7–2.4)

## 2019-08-19 LAB — C-REACTIVE PROTEIN: CRP: <0.8 mg/dL (ref <1.0)

## 2019-08-19 LAB — VITAMIN B12: Vitamin B-12: 296 pg/mL (ref 180–914)

## 2019-08-24 ENCOUNTER — Other Ambulatory Visit: Payer: Self-pay | Admitting: Pain Medicine

## 2019-08-24 DIAGNOSIS — M7918 Myalgia, other site: Secondary | ICD-10-CM

## 2019-08-24 DIAGNOSIS — G8929 Other chronic pain: Secondary | ICD-10-CM

## 2019-09-02 NOTE — Progress Notes (Signed)

## 2019-09-02 NOTE — Progress Notes (Signed)
Test: Blood sugar (Glucose levels) Finding(s): High (hyperglycemia) Normal Level(s): Normal fasting (NPO x 8 hours) glucose levels are between 65-99 mg/dl, with 2 hour fasting, levels are usually less than 140 mg/dl. Clinical significance: Any random blood glucose level greater than 200 mg/dl is considered to be Diabetes. Signs and symptoms may include: (when persistently above 180 mg/dL) Increased thirst; headaches; trouble concentrating; blurred vision; frequent peeing (urination); fatigue (weakness and tired feeling); weight loss. The most common and classical symptoms of an undiagnosed diabetes with hyperglycemia are: Increased urinary frequency (polyuria), thirst (polydipsia), hunger (polyphagia), and unexplained weight loss; numbness in the extremities, pain in feet (dysesthesias), fatigue, and blurred vision; recurrent or severe infections; loss of consciousness or severe nausea/vomiting (ketoacidosis) or coma. Patient Recommendation(s): Fasting levels above 140 mg/dL or any levels above 200 mg/dL should follow-up with PCP (Primary Care Physician) for further evaluation. ___________________________________________________________________________________ - Normal Creatinine levels are between 0.5 and 0.9 mg/dl for our lab. Any condition that impairs the function of the kidneys is likely to raise the creatinine level in the blood. The most common causes of longstanding (chronic) kidney disease in adults are high blood pressure and diabetes. Other causes of elevated blood creatinine levels include drugs, ingestion of a large amount of dietary meat, kidney infections, rhabdomyolysis (abnormal muscle breakdown), and urinary tract obstruction. ___________________________________________________________________________________ - This is a blood test that measures the amount of a substance called bilirubin. This test is used to find out how well your liver is working. It is often given as part of a panel of  tests that measure liver function. A small amount of bilirubin in your blood is normal, but a high level may be a sign of liver disease. - The liver makes bile to help you digest food, and bile contains bilirubin. Most bilirubin comes from the body's normal process of breaking down old red blood cells. A healthy liver can normally get rid of bilirubin. But when you have liver problems, it can build up in your body to unhealthy levels. - The reference range of total bilirubin is 0.2-1.2 mg/dL. The reference range of direct bilirubin is 0.1-0.4 mg/dL. - Elevated bilirubin levels (>2.5-3 mg/dL) cause jaundice and can be classified into different anatomical sites of pathology: prehepatic (increased bilirubin production), hepatic (liver dysfunction), or posthepatic (duct obstruction). - Because Total Bilirubin is the sum of the conjugated and the unconjugated bilirubin, elevated levels may require further testing. ___________________________________________________________________________________ eGFR (Estimated Glomerular Filtration Rate) results are reported as milliliters/minute/1.52m (mL/min/1.762m. Because some laboratories do not collect information on a patient's race when the sample is collected for testing, they may report calculated results for both African Americans and non-African Americans.  The NaNationwide Mutual InsuranceNVa [facility] Center - Lyons Campussuggests only reporting actual results once values are < 60 mL/min. 1. Normal values: 90-120 mL/min 2. Below 60 mL/min suggests that some kidney damage has occurred. 3. Between 598nd 30 indicate (Moderate) Stage 3 kidney disease. 4. Between 29 and 15 represent (Severe) Stage 4 kidney disease. 5. Less than 15 is considered (Kidney Failure) Stage 5. ___________________________________________________________________________________

## 2019-09-03 ENCOUNTER — Encounter: Payer: Self-pay | Admitting: Pain Medicine

## 2019-09-07 ENCOUNTER — Ambulatory Visit: Payer: PPO | Attending: Pain Medicine | Admitting: Pain Medicine

## 2019-09-07 ENCOUNTER — Other Ambulatory Visit: Payer: Self-pay

## 2019-09-07 DIAGNOSIS — G894 Chronic pain syndrome: Secondary | ICD-10-CM | POA: Diagnosis not present

## 2019-09-07 DIAGNOSIS — M545 Low back pain, unspecified: Secondary | ICD-10-CM

## 2019-09-07 DIAGNOSIS — M7918 Myalgia, other site: Secondary | ICD-10-CM

## 2019-09-07 DIAGNOSIS — M79604 Pain in right leg: Secondary | ICD-10-CM | POA: Diagnosis not present

## 2019-09-07 DIAGNOSIS — M79605 Pain in left leg: Secondary | ICD-10-CM | POA: Diagnosis not present

## 2019-09-07 DIAGNOSIS — G8929 Other chronic pain: Secondary | ICD-10-CM

## 2019-09-07 MED ORDER — OXYCODONE HCL 20 MG PO TABS: 20.0000 mg | ORAL_TABLET | Freq: Three times a day (TID) | ORAL | 0 refills | Status: DC | PRN

## 2019-09-07 MED ORDER — TIZANIDINE HCL 4 MG PO CAPS: 4.0000 mg | ORAL_CAPSULE | Freq: Three times a day (TID) | ORAL | 2 refills | Status: DC | PRN

## 2019-09-07 NOTE — Progress Notes (Signed)
Pain Management Virtual Encounter Note - Virtual Visit via Telephone Telehealth (real-time audio visits between healthcare provider and patient).   Patient's Phone No. & Preferred Pharmacy:  [phone] (home); [phone] (mobile); (Preferred) [phone] [email or web address]  [id number] [place], [place] - [address] [id number] [place] [address] Phone: [phone] Fax: [phone]    Pre-screening note:  Our staff contacted James [name] and offered him an "in person", "face-to-face" appointment versus a telephone encounter. He indicated preferring the telephone encounter, at this time.   Reason for Virtual Visit: COVID-19*  Social distancing based on CDC and AMA recommendations.   I contacted James [name] on 09/07/2019 via telephone.      I clearly identified myself as Gaspar Cola, MD. I verified that I was speaking with the correct person using two identifiers (Name: James [name], and date of birth: 1956/03/03).  Advanced Informed Consent I sought verbal advanced consent from James [name] for virtual visit interactions. I informed James [name] of possible security and privacy concerns, risks, and limitations associated with providing "not-in-person" medical evaluation and management services. I also informed James [name] of the availability of "in-person" appointments. Finally, I informed him that there would be a charge for the virtual visit and that he could be  personally, fully or partially, financially responsible for it. James [name] expressed understanding and agreed to proceed.   Historic Elements   James [name] is a 63 y.o. year old, male patient evaluated today after his last encounter by our practice on 08/24/2019. James [name]  has a past medical history of Allergy, Anemia, Arthritis, Asthma, Blood transfusion without reported diagnosis, Bronchitis, Bursitis, Cancer ([place]), Cataract, Complication of  implanted electronic neurostimulator of spinal cord (09/13/2015), Emphysema of lung ([place]), Gastritis, GERD (gastroesophageal reflux disease), Heart murmur, Hepatitis C, Hiatal hernia, Hypertension, Hypothyroidism, Kidney stone, Lupus ([place]), Obesity, Osteoporosis, Peripheral nerve disease, Psoriatic arthritis ([place]), Sleep apnea, Supraventricular tachycardia ([place]), Tendonitis, and Thyroid disease. He also  has a past surgical history that includes Spine surgery; Eye surgery; Fracture surgery (Right); Fracture surgery (Bilateral); Fracture surgery (Bilateral); Joint replacement (Right); Spinal cord stimulator implant; Spinal cord stimulator removal; Patella arthroplasty; Shoulder arthroscopy (Bilateral); Esophagogastroduodenoscopy (egd) with propofol (N/A, 01/20/2016); and Colonoscopy (2009). James [name] has a current medication list which includes the following prescription(s): albuterol, azelastine, b complex vitamins, folic acid, loratadine, methotrexate (pf), metoprolol tartrate, metoprolol succinate, naloxone, oxycodone hcl, oxycodone hcl, oxycodone hcl, pantoprazole, prednisone, sucralfate, testosterone cypionate, and tizanidine. He  reports that he has quit smoking. His smoking use included cigarettes. His smokeless tobacco use includes chew. He reports that he does not drink alcohol or use drugs. James [name] is allergic to penicillin g; penicillins; lactose; nsaids; procaine; sulfasalazine; talwin [pentazocine]; codeine; and sulfa antibiotics.   HPI  Today, he is being contacted for medication management.  The patient indicates doing well with the current medication regimen. No adverse reactions or side effects reported to the medications.   He indicates that he is very thankful that we tested his vitamin D because of sinus he found that he was low he started taking 2000 international units/day of D3 along with calcium and magnesium and this immediately started helping him with a problem that he was  experiencing with hypertension, A. fib, decreased heart rate, and decreased oxygen saturation.  Pharmacotherapy Assessment  Analgesic: Oxycodone IR 20 mg, 1 tab PO q 8hrs (60 mg/day of oxycodone) MME/day:120 mg/day.   Monitoring: Pharmacotherapy: No side-effects  or adverse reactions reported. [place] PMP: PDMP reviewed during this encounter.       Compliance: No problems identified. Effectiveness: Clinically acceptable. Plan: Refer to "POC".  UDS:  Summary  Date Value Ref Range Status  11/10/2018 FINAL  Final    Comment:    ==================================================================== TOXASSURE SELECT 13 (MW) ==================================================================== Test                             Result       Flag       Units Drug Present and Declared for Prescription Verification   Oxycodone                      2457         EXPECTED   ng/mg creat   Oxymorphone                    2404         EXPECTED   ng/mg creat   Noroxycodone                   >3937        EXPECTED   ng/mg creat   Noroxymorphone                 1228         EXPECTED   ng/mg creat    Sources of oxycodone are scheduled prescription medications.    Oxymorphone, noroxycodone, and noroxymorphone are expected    metabolites of oxycodone. Oxymorphone is also available as a    scheduled prescription medication. ==================================================================== Test                      Result    Flag   Units      Ref Range   Creatinine              254              mg/dL      >=20 ==================================================================== Declared Medications:  The flagging and interpretation on this report are based on the  following declared medications.  Unexpected results may arise from  inaccuracies in the declared medications.  **Note: The testing scope of this panel includes these medications:  Oxycodone  **Note: The testing scope of this panel does not  include following  reported medications:  Albuterol  Azelastine (Astelin)  Folic acid (Folvite)  Loratadine (Claritin)  Methotrexate  Metoprolol (Lopressor)  Naloxone (Narcan)  Tizanidine (Zanaflex) ==================================================================== For clinical consultation, please call [phone]. ====================================================================    Laboratory Chemistry Profile (12 mo)  Renal: 08/19/2019: BUN 15; Creatinine, Ser 1.47  Lab Results  Component Value Date   GFRAA 59 (L) 08/19/2019   GFRNONAA 51 (L) 08/19/2019   Hepatic: 08/19/2019: Albumin 4.2 Lab Results  Component Value Date   AST 25 08/19/2019   ALT 24 08/19/2019   Other: 08/19/2019: CRP <0.8; Sed Rate 1; Vit D, 25-Hydroxy 26.19; Vitamin B-12 296 Note: Above Lab results reviewed.  Imaging  Last 90 days:  No results found.  Assessment  The primary encounter diagnosis was Chronic pain syndrome. Diagnoses of Chronic low back pain (Primary Area of Pain) (Bilateral) (R>L), Chronic lower extremity pain (Secondary area of Pain) (Bilateral) (L>R), Myofascial pain syndrome, cervical (rhomboid muscles) (intermittent), and Chronic musculoskeletal pain were also pertinent to this visit.  Plan of Care  I am having James [name].  James [name] start on Oxycodone HCl and Oxycodone HCl. I am also having him maintain his albuterol, metoprolol tartrate, azelastine, folic acid, naloxone, loratadine, predniSONE, methotrexate (PF), b complex vitamins, testosterone cypionate, metoprolol succinate, pantoprazole, sucralfate, tiZANidine, and Oxycodone HCl.  Pharmacotherapy (Medications Ordered): Meds ordered this encounter  Medications  . tiZANidine (ZANAFLEX) 4 MG capsule    Sig: Take 1 capsule (4 mg total) by mouth 3 (three) times daily as needed for muscle spasms.    Dispense:  100 capsule    Refill:  2    Fill one day early if pharmacy is closed on scheduled refill date. May substitute for  generic if available.  . Oxycodone HCl 20 MG TABS    Sig: Take 1 tablet (20 mg total) by mouth every 8 (eight) hours as needed. Must last 30 days    Dispense:  90 tablet    Refill:  0    Chronic Pain: STOP Act (Not applicable) Fill 1 day early if closed on refill date. Do not fill until: 09/07/2019. To last until: 10/07/2019. Avoid benzodiazepines within 8 hours of opioids  . Oxycodone HCl 20 MG TABS    Sig: Take 1 tablet (20 mg total) by mouth every 8 (eight) hours as needed. Must last 30 days    Dispense:  90 tablet    Refill:  0    Chronic Pain: STOP Act (Not applicable) Fill 1 day early if closed on refill date. Do not fill until: 10/07/2019. To last until: 11/06/2019. Avoid benzodiazepines within 8 hours of opioids  . Oxycodone HCl 20 MG TABS    Sig: Take 1 tablet (20 mg total) by mouth every 8 (eight) hours as needed. Must last 30 days    Dispense:  90 tablet    Refill:  0    Chronic Pain: STOP Act (Not applicable) Fill 1 day early if closed on refill date. Do not fill until: 11/06/2019. To last until: 12/06/2019. Avoid benzodiazepines within 8 hours of opioids   Orders:  No orders of the defined types were placed in this encounter.  Follow-up plan:   Return in about 3 months (around 12/02/2019) for (VV), (MM).      Interventional therapies:  Considering:  Possible bilateral lumbar facet RFA  Possible bilateral cervical facet RFA    Palliative PRN treatment(s):  Palliative right CESI Palliative right L4-5 LESI #2  Palliative bilateral lumbar facet blocks Palliative left lumbar facet RFA #2 (Last done 07/18/17) Palliative bilateral cervical facet blocks    Recent Visits No visits were found meeting these conditions.  Showing recent visits within past 90 days and meeting all other requirements   Today's Visits Date Type Provider Dept  09/07/19 Telemedicine Milinda Pointer, MD [facility]  Showing today's visits and meeting all other requirements   Future  Appointments No visits were found meeting these conditions.  Showing future appointments within next 90 days and meeting all other requirements   I discussed the assessment and treatment plan with the patient. The patient was provided an opportunity to ask questions and all were answered. The patient agreed with the plan and demonstrated an understanding of the instructions.  Patient advised to call back or seek an in-person evaluation if the symptoms or condition worsens.  Total duration of non-face-to-face encounter: 15 minutes.  Note by: Gaspar Cola, MD Date: 09/07/2019; Time: 10:31 AM  Note: This dictation was prepared with Dragon dictation. Any transcriptional errors that may result from this process are unintentional.  Disclaimer:  *  Given the special circumstances of the COVID-19 pandemic, the federal government has announced that the Office for Civil Rights (OCR) will exercise its enforcement discretion and will not impose penalties on physicians using telehealth in the event of noncompliance with regulatory requirements under the [place] and [place] (HIPAA) in connection with the good faith provision of telehealth during the URKYH-06 national public health emergency. ([place])

## 2019-11-25 ENCOUNTER — Encounter: Payer: Self-pay | Admitting: Pain Medicine

## 2019-11-26 NOTE — Progress Notes (Signed)
Patient: James [name]  Service Category: E/M  Provider: Gaspar Cola, MD  DOB: 23-Jun-1956  DOS: 11/30/2019  Location: Office  MRN: [id number]  Setting: Ambulatory outpatient  Referring Provider: [name], MD  Type: Established Patient  Specialty: Interventional Pain Management  PCP: [name], MD  Location: Remote location  Delivery: TeleHealth     Virtual Encounter - Pain Management PROVIDER NOTE: Information contained herein reflects review and annotations entered in association with encounter. Interpretation of such information and data should be left to medically-trained personnel. Information provided to patient can be located elsewhere in the medical record under "Patient Instructions". Document created using STT-dictation technology, any transcriptional errors that may result from process are unintentional.    Contact & Pharmacy Preferred: [phone] Home: [phone] (home) Mobile: [phone] (mobile) E-mail: [email or web address]  [facility] #[id number] [place], [place] - [address] [place] [address] Phone: [phone] Fax: [phone]   Pre-screening  James [name] offered "in-person" vs "virtual" encounter. He indicated preferring virtual for this encounter.   Reason COVID-19*  Social distancing based on CDC and AMA recommendations.   I contacted James [name] on 11/30/2019 via telephone.      I clearly identified myself as Gaspar Cola, MD. I verified that I was speaking with the correct person using two identifiers (Name: James [name], and date of birth: 09-Mar-1956).  Consent I sought verbal advanced consent from James [name] for virtual visit interactions. I informed James [name] of possible security and privacy concerns, risks, and limitations associated with providing "not-in-person" medical evaluation and management services. I also informed James [name] of the availability of "in-person"  appointments. Finally, I informed him that there would be a charge for the virtual visit and that he could be  personally, fully or partially, financially responsible for it. James [name] expressed understanding and agreed to proceed.   Historic Elements   James [name] is a 64 y.o. year old, male patient evaluated today after his last encounter by our practice on 08/24/2019. James [name]  has a past medical history of Allergy, Anemia, Arthritis, Asthma, Blood transfusion without reported diagnosis, Bronchitis, Bursitis, Cancer ([place]), Cataract, 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09/13/2015), Emphysema of lung ([place]), Gastritis, GERD (gastroesophageal reflux disease), Heart murmur, Hepatitis C, Hiatal hernia, Hypertension, Hypothyroidism, Kidney stone, Lupus ([place]), Obesity, Osteoporosis, Peripheral nerve disease, Psoriatic arthritis ([place]), Sleep apnea, Supraventricular tachycardia ([place]), Tendonitis, and Thyroid disease. He also  has a past surgical history that includes Spine surgery; Eye surgery; Fracture surgery (Right); Fracture surgery (Bilateral); Fracture surgery (Bilateral); Joint replacement (Right); Spinal cord stimulator implant; Spinal cord stimulator removal; Patella arthroplasty; Shoulder arthroscopy (Bilateral); Esophagogastroduodenoscopy (egd) with propofol (N/A, 01/20/2016); and Colonoscopy (2009). James [name] has a current medication list which includes the following prescription(s): albuterol, azelastine, b complex vitamins, folic acid, loratadine, methotrexate (pf), metoprolol tartrate, metoprolol succinate, naloxone, [START ON 12/06/2019] oxycodone hcl, [START ON 01/05/2020] oxycodone hcl, [START ON 02/04/2020] oxycodone hcl, pantoprazole, prednisone, sucralfate, testosterone cypionate, and tizanidine. He  reports that he has quit smoking. His smoking use included cigarettes. His smokeless tobacco use includes chew. He reports that he does not drink alcohol or  use drugs. James [name] is allergic to penicillin g; penicillins; lactose; nsaids; procaine; sulfasalazine; talwin [pentazocine]; codeine; and sulfa antibiotics.   HPI  Today, he is being contacted for medication management. The patient indicates doing well with the current medication regimen. No adverse reactions or side  effects reported to the medications.  The patient also indicates being incredibly thankful to loss for having identified his vitamin D deficiency.  Apparently this was affecting his heart to the point where his blood pressure was bottoming and he was having episodes of bradycardia.  At one point, he was thinking that he was knocking a make it.  He was incredibly weak but soon after we replaced his vitamin D deficiency, all of the symptoms went away and he indicated that he felt like he had been rewarmed.  He is now back to walking around and doing things that he was not able to do for many years.  His pain is again well in the control, his cardiac dysrhythmias are completely gone and his blood pressure is now within normal limits.  He indicates that he believes we might have saved him his life.  Pharmacotherapy Assessment  Analgesic: Oxycodone IR 20 mg, 1 tab PO q 8hrs (60 mg/day of oxycodone) MME/day:120 mg/day.   Monitoring: Pharmacotherapy: No side-effects or adverse reactions reported. [place] PMP: PDMP reviewed during this encounter.       Compliance: No problems identified. Effectiveness: Clinically acceptable. Plan: Refer to "POC".  UDS:  Summary  Date Value Ref Range Status  11/10/2018 FINAL  Final    Comment:    ==================================================================== TOXASSURE SELECT 13 (MW) ==================================================================== Test                             Result       Flag       Units Drug Present and Declared for Prescription Verification   Oxycodone                      2457         EXPECTED   ng/mg creat    Oxymorphone                    2404         EXPECTED   ng/mg creat   Noroxycodone                   >3937        EXPECTED   ng/mg creat   Noroxymorphone                 1228         EXPECTED   ng/mg creat    Sources of oxycodone are scheduled prescription medications.    Oxymorphone, noroxycodone, and noroxymorphone are expected    metabolites of oxycodone. Oxymorphone is also available as a    scheduled prescription medication. ==================================================================== Test                      Result    Flag   Units      Ref Range   Creatinine              254              mg/dL      >=20 ==================================================================== Declared Medications:  The flagging and interpretation on this report are based on the  following declared medications.  Unexpected results may arise from  inaccuracies in the declared medications.  **Note: The testing scope of this panel includes these medications:  Oxycodone  **Note: The testing scope of this panel does not include following  reported medications:  Albuterol  Azelastine (Astelin)  Folic acid (Folvite)  Loratadine (Claritin)  Methotrexate  Metoprolol (Lopressor)  Naloxone (Narcan)  Tizanidine (Zanaflex) ==================================================================== For clinical consultation, please call [phone]. ====================================================================    Laboratory Chemistry Profile (12 mo)  Renal: 08/19/2019: BUN 15; Creatinine, Ser 1.47  Lab Results  Component Value Date   GFRAA 59 (L) 08/19/2019   GFRNONAA 51 (L) 08/19/2019   Hepatic: 08/19/2019: Albumin 4.2 Lab Results  Component Value Date   AST 25 08/19/2019   ALT 24 08/19/2019   Other: 08/19/2019: CRP <0.8; Sed Rate 1; Vit D, 25-Hydroxy 26.19; Vitamin B-12 296  Note: Above Lab results reviewed.  Imaging  MR CERVICAL SPINE WO CONTRAST CLINICAL DATA:  Neck pain worse on  the right with radiculopathy. Numbness in the arms and hands with worsening symptoms in the past 6 months.  EXAM: MRI CERVICAL SPINE WITHOUT CONTRAST  TECHNIQUE: Multiplanar, multisequence MR imaging of the cervical spine was performed. No intravenous contrast was administered.  COMPARISON:  12/13/2016  FINDINGS: Alignment: Chronic cervical spine straightening. Unchanged minimal anterolisthesis of C3 on C4.  Vertebrae: No fracture or suspicious osseous lesion. Chronic degenerative endplate changes at D34-534 with decreased degenerative endplate edema.  Cord: Normal signal.  Posterior Fossa, vertebral arteries, paraspinal tissues: Unremarkable.  Disc levels:  C2-3: Mild right and moderate to severe left facet arthrosis without stenosis, unchanged.  C3-4: Central disc protrusion, uncovertebral spurring, and severe right and moderate left facet arthrosis result in borderline spinal stenosis with slight ventral cord flattening and moderate right neural foraminal stenosis, unchanged.  C4-5: Minimal disc bulging, mild uncovertebral spurring, and moderate right facet arthrosis without stenosis, unchanged.  C5-6: Moderate disc space narrowing. Disc bulging, uncovertebral spurring, and moderate right and mild left facet arthrosis result in mild spinal stenosis and moderate right greater than left neural foraminal stenosis, unchanged.  C6-7: Severe disc space narrowing. Broad-based posterior disc osteophyte complex results in mild spinal stenosis and mild-to-moderate right and borderline left neural foraminal stenosis, unchanged.  C7-T1: Minimal disc bulging and moderate to severe right and mild left facet arthrosis result in mild right neural foraminal stenosis without spinal stenosis, unchanged.  IMPRESSION: 1. Decreased degenerative endplate edema at D34-534. 2. Otherwise unchanged appearance of the cervical spine with up to mild spinal and moderate neural foraminal  stenosis at multiple levels as above.  Electronically Signed   By: Logan Bores M.D.   On: 09/19/2018 10:30   Assessment  The primary encounter diagnosis was Chronic pain syndrome. Diagnoses of Chronic low back pain (Primary Area of Pain) (Bilateral) (R>L), Chronic lower extremity pain (Secondary area of Pain) (Bilateral) (L>R), and 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) were also pertinent to this visit.  Plan of Care  Problem-specific:  No problem-specific Assessment & Plan notes found for this encounter.  I am having James [name] start on Oxycodone HCl and Oxycodone HCl. I am also having him maintain his albuterol, metoprolol tartrate, azelastine, folic acid, naloxone, loratadine, predniSONE, methotrexate (PF), b complex vitamins, testosterone cypionate, metoprolol succinate, pantoprazole, sucralfate, tiZANidine, and Oxycodone HCl.  Pharmacotherapy (Medications Ordered): Meds ordered this encounter  Medications  . Oxycodone HCl 20 MG TABS    Sig: Take 1 tablet (20 mg total) by mouth every 8 (eight) hours as needed. Must last 30 days    Dispense:  90 tablet    Refill:  0    Chronic Pain: STOP Act (Not applicable) Fill 1 day early if closed on refill date. Do not fill until: 12/06/2019. To last until:  01/05/2020. Avoid benzodiazepines within 8 hours of opioids  . Oxycodone HCl 20 MG TABS    Sig: Take 1 tablet (20 mg total) by mouth every 8 (eight) hours as needed. Must last 30 days    Dispense:  90 tablet    Refill:  0    Chronic Pain: STOP Act (Not applicable) Fill 1 day early if closed on refill date. Do not fill until: 01/05/2020. To last until: 02/04/2020. Avoid benzodiazepines within 8 hours of opioids  . Oxycodone HCl 20 MG TABS    Sig: Take 1 tablet (20 mg total) by mouth every 8 (eight) hours as needed. Must last 30 days    Dispense:  90 tablet    Refill:  0    Chronic Pain: STOP Act (Not applicable) Fill 1 day early if closed on refill date. Do not fill  until: 02/04/2020. To last until: 03/05/2020. Avoid benzodiazepines within 8 hours of opioids   Orders:  No orders of the defined types were placed in this encounter.  Follow-up plan:   Return in about 3 months (around 03/02/2020) for (VV), (MM).      Interventional therapies:  Considering:  Possible bilateral lumbar facet RFA  Possible bilateral cervical facet RFA    Palliative PRN treatment(s):  Palliative right CESI Palliative right L4-5 LESI #2  Palliative bilateral lumbar facet blocks Palliative left lumbar facet RFA #2 (Last done 07/18/17) Palliative bilateral cervical facet blocks     Recent Visits Date Type Provider Dept  09/07/19 Telemedicine Milinda Pointer, MD [facility]  Showing recent visits within past 90 days and meeting all other requirements   Today's Visits Date Type Provider Dept  11/30/19 Telemedicine Milinda Pointer, MD [facility]  Showing today's visits and meeting all other requirements   Future Appointments No visits were found meeting these conditions.  Showing future appointments within next 90 days and meeting all other requirements   I discussed the assessment and treatment plan with the patient. The patient was provided an opportunity to ask questions and all were answered. The patient agreed with the plan and demonstrated an understanding of the instructions.  Patient advised to call back or seek an in-person evaluation if the symptoms or condition worsens.  Duration of encounter: 15 minutes.  Note by: Gaspar Cola, MD Date: 11/30/2019; Time: 10:56 AM

## 2019-11-30 ENCOUNTER — Ambulatory Visit: Payer: Medicare Other | Attending: Pain Medicine | Admitting: Pain Medicine

## 2019-11-30 ENCOUNTER — Other Ambulatory Visit: Payer: Self-pay

## 2019-11-30 DIAGNOSIS — M545 Low back pain, unspecified: Secondary | ICD-10-CM

## 2019-11-30 DIAGNOSIS — M79605 Pain in left leg: Secondary | ICD-10-CM | POA: Diagnosis not present

## 2019-11-30 DIAGNOSIS — G8929 Other chronic pain: Secondary | ICD-10-CM

## 2019-11-30 DIAGNOSIS — G894 Chronic pain syndrome: Secondary | ICD-10-CM | POA: Diagnosis not present

## 2019-11-30 DIAGNOSIS — M79604 Pain in right leg: Secondary | ICD-10-CM | POA: Diagnosis not present

## 2019-11-30 DIAGNOSIS — M961 Postlaminectomy syndrome, not elsewhere classified: Secondary | ICD-10-CM

## 2019-11-30 MED ORDER — OXYCODONE HCL 20 MG PO TABS: 20.0000 mg | ORAL_TABLET | Freq: Three times a day (TID) | ORAL | 0 refills | Status: DC | PRN

## 2020-01-01 ENCOUNTER — Telehealth: Payer: Self-pay | Admitting: Pain Medicine

## 2020-01-01 ENCOUNTER — Telehealth: Payer: Self-pay

## 2020-01-01 NOTE — Telephone Encounter (Signed)
Pt called and stated that when he picked up his [facility]cod[facility] from [facility] that they only gave him 30 pills and he was supposed to get 32. Pt wants to know if Dr Delane Ginger has decreased his dose or if this was a mistake.

## 2020-01-01 NOTE — Telephone Encounter (Signed)
Patient states that he called the pharmacy and spoke to the head pharmacist and they were going to fix it and given him his correct number of pills.

## 2020-01-11 DIAGNOSIS — R5381 Other malaise: Secondary | ICD-10-CM | POA: Insufficient documentation

## 2020-01-11 DIAGNOSIS — R5383 Other fatigue: Secondary | ICD-10-CM | POA: Insufficient documentation

## 2020-02-08 DIAGNOSIS — D696 Thrombocytopenia, unspecified: Secondary | ICD-10-CM | POA: Insufficient documentation

## 2020-02-08 DIAGNOSIS — D649 Anemia, unspecified: Secondary | ICD-10-CM | POA: Insufficient documentation

## 2020-02-08 DIAGNOSIS — E559 Vitamin D deficiency, unspecified: Secondary | ICD-10-CM | POA: Insufficient documentation

## 2020-02-08 DIAGNOSIS — K909 Intestinal malabsorption, unspecified: Secondary | ICD-10-CM | POA: Insufficient documentation

## 2020-02-08 DIAGNOSIS — R000 Tachycardia, unspecified: Secondary | ICD-10-CM | POA: Insufficient documentation

## 2020-03-01 ENCOUNTER — Encounter: Payer: Self-pay | Admitting: Pain Medicine

## 2020-03-01 ENCOUNTER — Telehealth: Payer: Self-pay

## 2020-03-01 NOTE — Progress Notes (Signed)
Patient: James [name]  Service Category: E/M  Provider: Gaspar Cola, MD  DOB: [birth year]  DOS: 03/02/2020  Location: Office  MRN: [id number]  Setting: Ambulatory outpatient  Referring Provider: [name], MD  Type: Established Patient  Specialty: Interventional Pain Management  PCP: [name], MD  Location: Remote location  Delivery: TeleHealth     Virtual Encounter - Pain Management PROVIDER NOTE: Information contained herein reflects review and annotations entered in association with encounter. Interpretation of such information and data should be left to medically-trained personnel. Information provided to patient can be located elsewhere in the medical record under "Patient Instructions". Document created using STT-dictation technology, any transcriptional errors that may result from process are unintentional.    Contact & Pharmacy Preferred: [phone] Home: [phone] (home) Mobile: [phone] (mobile) E-mail: [email or web address]  [id number] [place] [place] [facility] [id number] [id number]: [phone]: [phone]  Pre-screening  [name] "in-person" vs "virtual" encounter. He indicated preferring virtual for this encounter.   Reason COVID-19*  Social distancing based on CDC and AMA recommendations.   I contacted CSanjuana Kavaon 03/02/2020 via telephone.      I clearly identified myself as FGaspar Cola MD. I verified that I was speaking with the correct person using two identifiers (Name: James [name] and date of birth: 2June 06, 1957.  Consent I sought verbal advanced consent from CSanjuana Kavafor virtual visit interactions. I informed Mr. MSheriffof possible security and privacy concerns, risks, and limitations associated with providing "not-in-person" medical evaluation and management services. I also informed Mr. MDuarteof the availability of "in-person"  appointments. Finally, I informed him that there would be a charge for the virtual visit and that he could be  personally, fully or partially, financially responsible for it. Mr. MBarnettexpressed understanding and agreed to proceed.   Historic Elements   Mr. James LEVARIOis a 64y.o. year old, male patient evaluated today after his last contact with our practice on 01/01/2020. 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 He also  has a past surgical history that includes Spine surgery; Eye surgery; Fracture surgery (Right); Fracture surgery (Bilateral); Fracture surgery (Bilateral); Joint replacement (Right); Spinal cord stimulator implant; Spinal cord stimulator removal; Patella arthroplasty; Shoulder arthroscopy (Bilateral); Esophagogastroduodenoscopy (egd) with propofol (N/A, 01/20/2016); and Colonoscopy (2009). Mr. MDoolinhas a current medication list which includes the following prescription(s): albuterol, azelastine, b complex vitamins, ergocalciferol, folic acid, loratadine, methotrexate (pf), metoprolol tartrate, multivitamin, [START ON 03/05/2020] oxycodone hcl, [START ON 04/04/2020] oxycodone hcl, [START ON 05/04/2020] oxycodone hcl, pantoprazole, prednisone, sucralfate, testosterone cypionate, tizanidine, and naloxone. He  reports that he has quit smoking. His smoking use included cigarettes. His smokeless tobacco use includes chew. He reports that he does not drink  alcohol or use drugs. Mr. MZehnderis allergic to penicillins; lactose; nsaids; talwin [pentazocine]; codeine; procaine; and sulfa antibiotics.   HPI  Today, he is being contacted for medication management.  The patient indicates doing well with the current medication or side effects reported to the medications.  Pharmacotherapy Assessment  Analgesic: Oxycodone IR 20 mg, 1 tab PO q 8hrs (60 mg/day of oxycodone) MME/day:120 mg/day.   Monitoring: [place] PMP: PDMP reviewed during this encounter.       Pharmacotherapy: No side-effects or adverse reactions reported. Compliance: No problems identified. Effectiveness: Clinically acceptable. Plan: Refer to "POC".  UDS:  Summary  Date Value Ref Range Status  11/10/2018 FINAL  Final    Comment:    ==================================================================== TOXASSURE SELECT 13 (MW) ==================================================================== Test                             Result       Flag       Units Drug Present and Declared for Prescription Verification   Oxycodone                      2457         EXPECTED   ng/mg creat   Oxymorphone                    2404         EXPECTED   ng/mg creat   Noroxycodone                   >3937        EXPECTED   ng/mg creat   Noroxymorphone                 1228         EXPECTED   ng/mg creat    Sources of oxycodone are scheduled prescription medications.    Oxymorphone, noroxycodone, and noroxymorphone are expected    metabolites of oxycodone. Oxymorphone is also available as a    scheduled prescription medication. ==================================================================== Test                      Result    Flag   Units      Ref Range   Creatinine              254              mg/dL      >=20 ==================================================================== Declared Medications:  The flagging and interpretation on this report are based on the  following declared  medications.  Unexpected results may arise from  inaccuracies in the declared medications.  **Note: The testing scope of this panel includes these medications:  Oxycodone  **Note: The testing scope of this panel does not include following  reported medications:  Albuterol  Azelastine (Astelin)  Folic acid (Folvite)  Loratadine (Claritin)  Methotrexate  Metoprolol (Lopressor)  Naloxone (Narcan)  Tizanidine (Zanaflex) ==================================================================== For clinical consultation, please call [phone]. ====================================================================    Laboratory Chemistry Profile   Renal Lab Results  Component Value Date   BUN 15 08/19/2019   CREATININE 1.47 (H) 08/19/2019   GFRAA 59 (L) 08/19/2019   GFRNONAA 51 (L) 08/19/2019     Hepatic Lab Results  Component Value Date   AST 25 08/19/2019   ALT 24 08/19/2019   ALBUMIN 4.2 08/19/2019   ALKPHOS 47 08/19/2019     Electrolytes Lab Results  Component Value Date   NA 138 08/19/2019   K 4.1 08/19/2019   CL 100 08/19/2019   CALCIUM 9.3 08/19/2019   MG 2.1 08/19/2019     Bone Lab Results  Component Value Date   VD25OH 26.19 (L) 08/19/2019  25OHVITD1 42 12/06/2016   25OHVITD2 <1.0 12/06/2016   25OHVITD3 42 12/06/2016   TESTOSTERONE 285 02/12/2018     Inflammation (CRP: Acute Phase) (ESR: Chronic Phase) Lab Results  Component Value Date   CRP <0.8 08/19/2019   ESRSEDRATE 1 08/19/2019       Note: Above Lab results reviewed.  Imaging  MR CERVICAL SPINE WO CONTRAST CLINICAL DATA:  Neck pain worse on the right with radiculopathy. Numbness in the arms and hands with worsening symptoms in the past 6 months.  EXAM: MRI CERVICAL SPINE WITHOUT CONTRAST  TECHNIQUE: Multiplanar, multisequence MR imaging of the cervical spine was performed. No intravenous contrast was administered.  COMPARISON:  12/13/2016  FINDINGS: Alignment: Chronic cervical  spine straightening. Unchanged minimal anterolisthesis of C3 on C4.  Vertebrae: No fracture or suspicious osseous lesion. Chronic degenerative endplate changes at O0-3 with decreased degenerative endplate edema.  Cord: Normal signal.  Posterior Fossa, vertebral arteries, paraspinal tissues: Unremarkable.  Disc levels:  C2-3: Mild right and moderate to severe left facet arthrosis without stenosis, unchanged.  C3-4: Central disc protrusion, uncovertebral spurring, and severe right and moderate left facet arthrosis result in borderline spinal stenosis with slight ventral cord flattening and moderate right neural foraminal stenosis, unchanged.  C4-5: Minimal disc bulging, mild uncovertebral spurring, and moderate right facet arthrosis without stenosis, unchanged.  C5-6: Moderate disc space narrowing. Disc bulging, uncovertebral spurring, and moderate right and mild left facet arthrosis result in mild spinal stenosis and moderate right greater than left neural foraminal stenosis, unchanged.  C6-7: Severe disc space narrowing. Broad-based posterior disc osteophyte complex results in mild spinal stenosis and mild-to-moderate right and borderline left neural foraminal stenosis, unchanged.  C7-T1: Minimal disc bulging and moderate to severe right and mild left facet arthrosis result in mild right neural foraminal stenosis without spinal stenosis, unchanged.  IMPRESSION: 1. Decreased degenerative endplate edema at O1-2. 2. Otherwise unchanged appearance of the cervical spine with up to mild spinal and moderate neural foraminal stenosis at multiple levels as above.  Electronically Signed   By: Logan Bores M.D.   On: 09/19/2018 10:30  Assessment  The primary encounter diagnosis was Chronic pain syndrome. Diagnoses of 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), Chronic low back pain (Primary Area of Pain) (Bilateral) (R>L), Chronic lower extremity pain  (Secondary area of Pain) (Bilateral) (L>R), Cervicalgia (Right), Pharmacologic therapy, Myofascial pain syndrome, cervical (rhomboid muscles) (intermittent), Chronic musculoskeletal pain, and Opiate use (120 MME/Day) were also pertinent to this visit.  Plan of Care  Problem-specific:  No problem-specific Assessment & Plan notes found for this encounter.  [name] has a current medication list which includes the following long-term medication(s): albuterol, [START ON 03/05/2020] oxycodone hcl, [START ON 04/04/2020] oxycodone hcl, [START ON 05/04/2020] oxycodone hcl, tizanidine, and naloxone.  Pharmacotherapy (Medications Ordered): Meds ordered this encounter  Medications  . tiZANidine (ZANAFLEX) 4 MG capsule    Sig: Take 1 capsule (4 mg total) by mouth 3 (three) times daily as needed for muscle spasms.    Dispense:  90 capsule    Refill:  5    Fill one day early if pharmacy is closed on scheduled refill date. May substitute for generic if available.  . Oxycodone HCl 20 MG TABS    Sig: Take 1 tablet (20 mg total) by mouth every 8 (eight) hours as needed. Must last 30 days    Dispense:  90 tablet    Refill:  0    Chronic Pain: STOP  Act (Not applicable) Fill 1 day early if closed on refill date. Do not fill until: 03/05/2020. To last until: 04/04/2020. Avoid benzodiazepines within 8 hours of opioids  . Oxycodone HCl 20 MG TABS    Sig: Take 1 tablet (20 mg total) by mouth every 8 (eight) hours as needed. Must last 30 days    Dispense:  90 tablet    Refill:  0    Chronic Pain: STOP Act (Not applicable) Fill 1 day early if closed on refill date. Do not fill until: 04/04/2020. To last until: 05/04/2020. Avoid benzodiazepines within 8 hours of opioids  . Oxycodone HCl 20 MG TABS    Sig: Take 1 tablet (20 mg total) by mouth every 8 (eight) hours as needed. Must last 30 days    Dispense:  90 tablet    Refill:  0    Chronic Pain: STOP Act (Not applicable) Fill 1 day early if closed on refill  date. Do not fill until: 05/04/2020. To last until: 06/03/2020. Avoid benzodiazepines within 8 hours of opioids  . naloxone (NARCAN) 2 MG/2ML injection    Sig: Inject 1 mL (1 mg total) into the muscle as needed for up to 2 doses (for emergency use only). Always have available. Inject into thigh muscle and call 911    Dispense:  2 mL    Refill:  1    Please instruct patient on emergency use of medication for opioid overdose.   Orders:  Orders Placed This Encounter  Procedures  . ToxASSURE Select 13 (MW), Urine    Volume: 30 ml(s). Minimum 3 ml of urine is needed. Document temperature of fresh sample. Indications: Long term (current) use of opiate analgesic (O82.417)    Order Specific Question:   Release to patient    Answer:   Immediate   Follow-up plan:   Return in about 3 months (around 06/01/2020) for (F2F), (MM).      Interventional therapies:  Considering:  Possible bilateral lumbar facet RFA  Possible bilateral cervical facet RFA    Palliative PRN treatment(s):  Palliative right CESI Palliative right L4-5 LESI #2  Palliative bilateral lumbar facet blocks Palliative left lumbar facet RFA #2 (Last done 07/18/17) Palliative bilateral cervical facet blocks      Recent Visits No visits were found meeting these conditions.  Showing recent visits within past 90 days and meeting all other requirements   Future Appointments No visits were found meeting these conditions.  Showing future appointments within next 90 days and meeting all other requirements   I discussed the assessment and treatment plan with the patient. The patient was provided an opportunity to ask questions and all were answered. The patient agreed with the plan and demonstrated an understanding of the instructions.  Patient advised to call back or seek an in-person evaluation if the symptoms or condition worsens.  Duration of encounter: 12 minutes.  Note by: Gaspar Cola, MD Date: 03/02/2020; Time:  9:42 AM

## 2020-03-01 NOTE — Telephone Encounter (Signed)
LM for patient to call office for pre virtual appointment questions.  

## 2020-03-02 ENCOUNTER — Ambulatory Visit: Payer: Medicare Other | Attending: Pain Medicine | Admitting: Pain Medicine

## 2020-03-02 ENCOUNTER — Telehealth: Payer: Self-pay

## 2020-03-02 ENCOUNTER — Other Ambulatory Visit: Payer: Self-pay

## 2020-03-02 DIAGNOSIS — F1190 Opioid use, unspecified, uncomplicated: Secondary | ICD-10-CM

## 2020-03-02 DIAGNOSIS — M545 Low back pain, unspecified: Secondary | ICD-10-CM

## 2020-03-02 DIAGNOSIS — M542 Cervicalgia: Secondary | ICD-10-CM

## 2020-03-02 DIAGNOSIS — M7918 Myalgia, other site: Secondary | ICD-10-CM

## 2020-03-02 DIAGNOSIS — M961 Postlaminectomy syndrome, not elsewhere classified: Secondary | ICD-10-CM

## 2020-03-02 DIAGNOSIS — M79604 Pain in right leg: Secondary | ICD-10-CM | POA: Diagnosis not present

## 2020-03-02 DIAGNOSIS — G8929 Other chronic pain: Secondary | ICD-10-CM

## 2020-03-02 DIAGNOSIS — G894 Chronic pain syndrome: Secondary | ICD-10-CM

## 2020-03-02 DIAGNOSIS — Z79899 Other long term (current) drug therapy: Secondary | ICD-10-CM

## 2020-03-02 DIAGNOSIS — M79605 Pain in left leg: Secondary | ICD-10-CM

## 2020-03-02 MED ORDER — OXYCODONE HCL 20 MG PO TABS: 20.0000 mg | ORAL_TABLET | Freq: Three times a day (TID) | ORAL | 0 refills | Status: DC | PRN

## 2020-03-02 MED ORDER — NALOXONE HCL 2 MG/2ML IJ SOSY: 1.0000 mg | PREFILLED_SYRINGE | INTRAMUSCULAR | 1 refills | Status: DC | PRN

## 2020-03-02 MED ORDER — TIZANIDINE HCL 4 MG PO CAPS: 4.0000 mg | ORAL_CAPSULE | Freq: Three times a day (TID) | ORAL | 5 refills | Status: DC | PRN

## 2020-03-02 NOTE — Telephone Encounter (Signed)
He lives out of town but is coming Monday to another doctors appt. He said he will come in on Monday to do his drug screen. I told him that was ok.

## 2020-03-11 LAB — TOXASSURE SELECT 13 (MW), URINE

## 2020-03-28 ENCOUNTER — Telehealth: Payer: Self-pay | Admitting: Pain Medicine

## 2020-03-28 NOTE — Telephone Encounter (Signed)
Patient has script sent in to tizanidine capsule, please call to change to tablet

## 2020-03-28 NOTE — Telephone Encounter (Signed)
[facility], T[facility]idine changed to tablets.

## 2020-04-13 DIAGNOSIS — R0602 Shortness of breath: Secondary | ICD-10-CM | POA: Insufficient documentation

## 2020-04-13 DIAGNOSIS — I351 Nonrheumatic aortic (valve) insufficiency: Secondary | ICD-10-CM | POA: Insufficient documentation

## 2020-04-13 DIAGNOSIS — R0609 Other forms of dyspnea: Secondary | ICD-10-CM | POA: Insufficient documentation

## 2020-04-13 DIAGNOSIS — R55 Syncope and collapse: Secondary | ICD-10-CM | POA: Insufficient documentation

## 2020-05-16 ENCOUNTER — Telehealth: Payer: Self-pay | Admitting: Pain Medicine

## 2020-05-16 NOTE — Telephone Encounter (Signed)
VV scheduled.

## 2020-05-16 NOTE — Telephone Encounter (Signed)
Patient started having severe pain in lower back into right leg. He went to [facility] and they had xrays done and have been trying to get MRI ordered. Patient wants to know if there is any way Dr. Dossie Arbour can get MRI approved? Patient would have to have something called in to enable him to come up for any appointment, he is 2 hours away.

## 2020-05-17 ENCOUNTER — Encounter: Payer: Self-pay | Admitting: Pain Medicine

## 2020-05-17 NOTE — Progress Notes (Signed)
Patient: James [name]  Service Category: E/M  Provider: Gaspar Cola, MD  DOB: 12/12/1955  DOS: 05/18/2020  Location: Office  MRN: [id number]  Setting: Ambulatory outpatient  Referring Provider: [name], MD  Type: Established Patient  Specialty: Interventional Pain Management  PCP: [name], MD  Location: Remote location  Delivery: TeleHealth     Virtual Encounter - Pain Management PROVIDER NOTE: Information contained herein reflects review and annotations entered in association with encounter. Interpretation of such information and data should be left to medically-trained personnel. Information provided to patient can be located elsewhere in the medical record under "Patient Instructions". Document created using STT-dictation technology, any transcriptional errors that may result from process are unintentional.    Contact & Pharmacy Preferred: [phone] Home: [phone] (home) Mobile: [phone] (mobile) E-mail: [email or web address]  [id number] [place] [facility] [id number] [address] [address]Phone: [phone]: [phone]  Pre-screening  Mr. MRomanoskioffered "in-person" vs "virtual" encounter. He indicated preferring virtual for this encounter.   Reason COVID-19*  Social distancing based on CDC and AMA recommendations.   I contacted James [name] 05/18/2020 via telephone.      I clearly identified myself as FGaspar Cola MD. I verified that I was speaking with the correct person using two identifiers (Name: James [name] and date of birth: 210/26/1957.  Consent I sought verbal advanced consent from James Kavafor virtual visit interactions. I informed Mr. MCurrierof possible security and privacy concerns, risks, and limitations associated with providing "not-in-person" medical evaluation and management services. I also informed Mr. MHamzaof the availability of "in-person"  appointments. Finally, I informed him that there would be a charge for the virtual visit and that he could be  personally, fully or partially, financially responsible for it. Mr. MConeexpressed understanding and agreed to proceed.   Historic Elements   Mr. CDEMETRIAS GOODBARis a 64y.o. year old, male patient evaluated today after his last contact with our practice on 05/16/2020. Mr. 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 He also  has a past surgical history that includes Spine surgery; Eye surgery; Fracture surgery (Right); Fracture surgery (Bilateral); Fracture surgery (Bilateral); Joint replacement (Right); Spinal cord stimulator implant; Spinal cord stimulator removal; Patella arthroplasty; Shoulder arthroscopy (Bilateral); Esophagogastroduodenoscopy (egd) with propofol (N/A, 01/20/2016); and Colonoscopy (2009). James [name] a current medication list which includes the following prescription(s): albuterol, azelastine, b complex vitamins, ergocalciferol, loratadine, metoprolol tartrate, multivitamin, naloxone, [START ON 06/03/2020] oxycodone hcl, [START ON 07/03/2020] oxycodone hcl, [START ON 08/02/2020] oxycodone hcl, sucralfate, and tizanidine. He  reports that he has quit smoking. His smoking use included cigarettes. His smokeless tobacco use includes chew. He reports that he does not drink alcohol and does not use drugs. Mr. MInclanis allergic to penicillins, lactose,  nsaids, talwin [pentazocine], codeine, procaine, and sulfa antibiotics.   HPI  Today, he is being contacted for worsening of previously known (established) problem.  According to the patient, around 04/19/2020, he was going down some steps and apparently fell in such a way  that he injured his back.  At the time he had some acute pain primarily in the back for which he went to see [facility] in [facility], where he moved to, not long ago.  He refers that this injury occurred approximately 3 weeks ago and he has been in excruciating pain since.  They have done to steroid injection into his muscle and they have also given him a steroid pack which did provide with some minimal relief of the pain, but this still continues.  During the time of this injury he experienced bladder incontinence that apparently lasted for about a week and he did not really notify anybody about this.  In fact he would have not said anything to me about it except that when I learned that at [facility] they had done some x-rays and they told him that he had an L2 compression fracture, this made me think about the cauda equina since the cord ends at around that level.  When I specifically asked him about bowel and bladder dysfunction, he confessed to the urinary incontinence that he experienced for about a week and he also indicated that his bowel has shut down more than usual for the past 2 weeks.  According to James [name], day orthopedic surgeons recommended to him either vertebroplasty or kyphoplasty, but apparently did not go into much details about what it was or what kind of benefits he may get from those.  They also did not go into a whole lot of details with regards to the fracture.  Today I have attempted to find the x-rays that he had done, but because they were done at [facility] in [place], there is nothing in epic about them, even in the "care everywhere" section.  At this point the patient indicates that his worst  pain is in the lower back, midline, at the level of his belt with the pain going to his buttocks and to the back of the leg with the right side being worse than the left.  He refers that on the right side the pain is "killing his hip".  He indicates having had back surgery around 1999.  He refers that this pain is nothing like what he had when he had the surgery.  Not the same pain at the same location and not the same pattern.  He indicated that at the time of his back surgery he was having a lot more back pain than leg pain.  His second area of pain is that of the lower extremities, bilaterally, but most of the pain is really only on the right side.  This pain travels down the leg through the back of the leg and rotating towards the front of the top of the foot with pain both in the top and bottom of his right foot and going into the big toe.  Today I asked the patient to toe walk and heel walk at home and to report back to me what the results with those were.  He indicated that he was unable to do either 1 with the right leg and when he attempted to heel walk on the right leg his foot would drop.  He refers that all of this is new to him.  Clearly he is having a new problem in his lumbar spine secondary to this apparent L2 vertebral body fracture, which due to the fact that he had bladder incontinence, would suggest that he could have fracture of the posterior aspect of the L2  vertebral body with retropulsion of the wall, compressing the spinal cord close to the cauda equina.  He also has fairly clear evidence of a right L5 radiculopathy that seems to be new and acute.  Today I spoke to the patient about the plan which involves doing an x-ray of the lumbar spine on flexion and extension to determine if we have any type of instability, as well as repeating his lumbar MRI to determine if he has retropulsion of the L2 vertebral body into the canal with cord compression.  In addition to this I have offered to the  patient an L1-2 lumbar epidural steroid injection under fluoroscopic guidance to see if we can decrease some of his pain which is likely to be secondary to a considerable amount of edema in the area from the chemicals released during the fracture.  I was very clear to the patient that this may provide him with some benefit, but the issue of how easily this fracture develop would suggest that he also has osteoporosis that may need to be addressed in the near future.  This is likely due to the low testosterone secondary to his regular high use of opioid analgesics (oxycodone).  Along the same lines, the patient asked me if I could increase his pain medication further so asked to assist with this pain, but I declined to do this due to the fact that I have known him for quite some time and he is already up taking oxycodone IR 20 mg every 8 hours (60 mg/day of oxycodone) (120 MME's).  I had previously warned him that using such high doses would be a problem if he then had any additional episodes of acute pain since nobody would feel comfortable prescribing higher doses than that.  According to him he has already had 2 IM injections of steroids and a steroid taper by the orthopedic surgeons suggesting that they have not been very shy in terms of treating this aggressively.  Because of this, I also took some time and explained to him the problems with the fracture and how it can decrease the distance between vertebral bodies causing foraminal stenosis at the L2-3 level which in turn can compress the L2 nerve roots triggering pain in the lower back and groin area, which to a certain extent I believe he is already experiencing at this point.  Not only that, but I also explained to him the possibility that he might of had a fracture of the posterior vertebral wall with retropulsion into the canal which may be compressing the spinal cord and causing the bowel and bladder problems.  I explained to him that should urinary  incontinence occurred again, he needs to go immediately to the emergency room since that he is actually a surgical emergency.  I also explained to him that the reason why it is an emergency is that she did not be addressed it could compress the nerves leading to permanent damage which in turn would cause him to end up with permanent neurogenic urinary incontinence.  I took the time today to also explain to him about the kyphoplasty and what it consisted of and how it could help stabilize the fracture and significantly decreased the pain from the fracture itself.  I also went ahead and placed a referral to neurosurgery for a kyphoplasty so asked to get this issue addressed, as soon as possible.  Summary of plan of care: Diagnostic x-rays of the lumbar spine on flexion and extension Diagnostic lumbar MRI  Possible right-sided L1-2 LESI #1  Neurosurgery consult for possible kyphoplasty and/or lumbar decompression and fusion  Follow-up with primary care physician to evaluate his vitamin D levels, testosterone levels, and status of his osteoporosis. Possible bone density test.  Pharmacotherapy Assessment  Analgesic: Oxycodone IR 20 mg, 1 tab PO q 8hrs (60 mg/day of oxycodone) MME/day:120 mg/day.   Monitoring: [place] PMP: PDMP reviewed during this encounter.       Pharmacotherapy: No side-effects or adverse reactions reported. Compliance: No problems identified. Effectiveness: Clinically acceptable. Plan: Refer to "POC".  UDS:  Summary  Date Value Ref Range Status  03/08/2020 Note  Final    Comment:    ==================================================================== [employer] Select 13 (MW) ==================================================================== Test                             Result       Flag       Units Drug Present and Declared for Prescription Verification   Oxycodone                      2372         EXPECTED   ng/mg creat   Oxymorphone                    2333          EXPECTED   ng/mg creat   Noroxycodone                   >3891        EXPECTED   ng/mg creat   Noroxymorphone                 782          EXPECTED   ng/mg creat    Sources of oxycodone are scheduled prescription medications.    Oxymorphone, noroxycodone, and noroxymorphone are expected    metabolites of oxycodone. Oxymorphone is also available as a    scheduled prescription medication. ==================================================================== Test                      Result    Flag   Units      Ref Range   Creatinine              257              mg/dL      >=20 ==================================================================== Declared Medications:  The flagging and interpretation on this report are based on the  following declared medications.  Unexpected results may arise from  inaccuracies in the declared medications.  **Note: The testing scope of this panel includes these medications:  Oxycodone  **Note: The testing scope of this panel does not include the  following reported medications:  Albuterol  Azelastine  Folic Acid  Loratadine  Methotrexate  Metoprolol (Lopressor)  Multivitamin  Naloxone (Narcan)  Pantoprazole (Protonix)  Prednisone (Deltasone)  Sucralfate (Carafate)  Testosterone  Tizanidine (Zanaflex)  Vitamin B  Vitamin D2 (Ergocalciferol) ==================================================================== For clinical consultation, please call [phone]. ====================================================================     Laboratory Chemistry Profile   Renal Lab Results  Component Value Date   BUN 15 08/19/2019   CREATININE 1.47 (H) 08/19/2019   GFRAA 59 (L) 08/19/2019   GFRNONAA 51 (L) 08/19/2019     Hepatic Lab Results  Component Value Date   AST 25 08/19/2019   ALT 24 08/19/2019   ALBUMIN 4.2  08/19/2019   ALKPHOS 47 08/19/2019     Electrolytes Lab Results  Component Value Date   NA 138 08/19/2019   K 4.1  08/19/2019   CL 100 08/19/2019   CALCIUM 9.3 08/19/2019   MG 2.1 08/19/2019     Bone Lab Results  Component Value Date   VD25OH 26.19 (L) 08/19/2019   25OHVITD1 42 12/06/2016   25OHVITD2 <1.0 12/06/2016   25OHVITD3 42 12/06/2016   TESTOSTERONE 285 02/12/2018     Inflammation (CRP: Acute Phase) (ESR: Chronic Phase) Lab Results  Component Value Date   CRP <0.8 08/19/2019   ESRSEDRATE 1 08/19/2019       Note: Above Lab results reviewed.   Imaging  MR CERVICAL SPINE WO CONTRAST CLINICAL DATA:  Neck pain worse on the right with radiculopathy. Numbness in the arms and hands with worsening symptoms in the past 6 months.  EXAM: MRI CERVICAL SPINE WITHOUT CONTRAST  TECHNIQUE: Multiplanar, multisequence MR imaging of the cervical spine was performed. No intravenous contrast was administered.  COMPARISON:  12/13/2016  FINDINGS: Alignment: Chronic cervical spine straightening. Unchanged minimal anterolisthesis of C3 on C4.  Vertebrae: No fracture or suspicious osseous lesion. Chronic degenerative endplate changes at T0-1 with decreased degenerative endplate edema.  Cord: Normal signal.  Posterior Fossa, vertebral arteries, paraspinal tissues: Unremarkable.  Disc levels:  C2-3: Mild right and moderate to severe left facet arthrosis without stenosis, unchanged.  C3-4: Central disc protrusion, uncovertebral spurring, and severe right and moderate left facet arthrosis result in borderline spinal stenosis with slight ventral cord flattening and moderate right neural foraminal stenosis, unchanged.  C4-5: Minimal disc bulging, mild uncovertebral spurring, and moderate right facet arthrosis without stenosis, unchanged.  C5-6: Moderate disc space narrowing. Disc bulging, uncovertebral spurring, and moderate right and mild left facet arthrosis result in mild spinal stenosis and moderate right greater than left neural foraminal stenosis, unchanged.  C6-7: Severe disc  space narrowing. Broad-based posterior disc osteophyte complex results in mild spinal stenosis and mild-to-moderate right and borderline left neural foraminal stenosis, unchanged.  C7-T1: Minimal disc bulging and moderate to severe right and mild left facet arthrosis result in mild right neural foraminal stenosis without spinal stenosis, unchanged.  IMPRESSION: 1. Decreased degenerative endplate edema at S0-1. 2. Otherwise unchanged appearance of the cervical spine with up to mild spinal and moderate neural foraminal stenosis at multiple levels as above.  Electronically Signed   By: Logan Bores M.D.   On: 09/19/2018 10:30  Assessment  The primary encounter diagnosis was Chronic pain syndrome. Diagnoses of Closed fracture of lumbar vertebral body ([place]) (L2), Closed compression fracture of L2 lumbar vertebra, initial encounter ([place]), Acute low back pain (Bilateral) w/ sciatica (Right), Acute exacerbation of chronic low back pain, Acute lower extremity pain (Right), Neurogenic bladder s/p L2 fracture (04/19/2020), Lumbosacral radiculopathy at L5 (Right), and Other intervertebral disc degeneration, lumbar region were also pertinent to this visit.  Plan of Care  Problem-specific:  No problem-specific Assessment & Plan notes found for this encounter.  James [name] has a current medication list which includes the following long-term medication(s): albuterol, naloxone, [START ON 06/03/2020] oxycodone hcl, [START ON 07/03/2020] oxycodone hcl, [START ON 08/02/2020] oxycodone hcl, and tizanidine.  Pharmacotherapy (Medications Ordered): Meds ordered this encounter  Medications  . Oxycodone HCl 20 MG TABS    Sig: Take 1 tablet (20 mg total) by mouth every 8 (eight) hours as needed. Must last 30 days    Dispense:  90 tablet    Refill:  0    Chronic Pain: STOP Act (Not applicable) Fill 1 day early if closed on refill date. Do not fill until: 06/03/2020. To last until: 07/03/2020. Avoid  benzodiazepines within 8 hours of opioids  . Oxycodone HCl 20 MG TABS    Sig: Take 1 tablet (20 mg total) by mouth every 8 (eight) hours as needed. Must last 30 days    Dispense:  90 tablet    Refill:  0    Chronic Pain: STOP Act (Not applicable) Fill 1 day early if closed on refill date. Do not fill until: 07/03/2020. To last until: 08/02/2020. Avoid benzodiazepines within 8 hours of opioids  . Oxycodone HCl 20 MG TABS    Sig: Take 1 tablet (20 mg total) by mouth every 8 (eight) hours as needed. Must last 30 days    Dispense:  90 tablet    Refill:  0    Chronic Pain: STOP Act (Not applicable) Fill 1 day early if closed on refill date. Do not fill until: 08/02/2020. To last until: 09/01/2020. Avoid benzodiazepines within 8 hours of opioids   Orders:  Orders Placed This Encounter  Procedures  . Lumbar Epidural Injection    Standing Status:   Future    Standing Expiration Date:   06/18/2020    Scheduling Instructions:     Procedure: Interlaminar Lumbar Epidural Steroid injection (LESI)  L1-2     Laterality: Right-sided     Sedation: With Sedation.     Timeframe: ASAP    Order Specific Question:   Where will this procedure be performed?    Answer:   [facility]  . MR LUMBAR SPINE WO CONTRAST    Patient presents with axial pain with possible radicular component.  In addition to any acute findings, please report on:  1. Facet (Zygapophyseal) joint DJD (Hypertrophy, space narrowing, subchondral sclerosis, and/or osteophyte formation) 2. DDD and/or IVDD (Loss of disc height, desiccation or "Black disc disease") 3. Pars defects 4. Spondylolisthesis, spondylosis, and/or spondyloarthropathies (include Degree/Grade of displacement in mm) 5. Vertebral body Fractures, including age (old, new/acute) 16. Modic Type Changes 7. Demineralization 8. Bone pathology 9. Central, Lateral Recess, and/or Foraminal Stenosis (include AP diameter of stenosis in mm) 10. Surgical changes (hardware type,  status, and presence of fibrosis)  NOTE: Please specify level(s) and laterality.    Standing Status:   Future    Standing Expiration Date:   08/18/2020    Order Specific Question:   What is the patient's sedation requirement?    Answer:   No Sedation    Order Specific Question:   Does the patient have a pacemaker or implanted devices?    Answer:   No    Order Specific Question:   Preferred imaging location?    Answer:   [facility] (table limit-350lbs)    Order Specific Question:   Call Results- Best Contact Number?    Answer:   [phone] ([facility])    Order Specific Question:   Radiology Contrast Protocol - do NOT remove file path    Answer:   \\[email or web address]    Order Specific Question:   ** REASON FOR EXAM (FREE TEXT)    Answer:   Patient evaluated at [facility].  He was told he had an L2 compression fracture.  . DG Lumbar Spine Complete W/Bend    Patient presents with axial pain with possible radicular component.  In addition to any acute findings, please report on:  1. Facet (  Zygapophyseal) joint DJD (Hypertrophy, space narrowing, subchondral sclerosis, and/or osteophyte formation) 2. DDD and/or IVDD (Loss of disc height, desiccation or "Black disc disease") 3. Pars defects 4. Spondylolisthesis, spondylosis, and/or spondyloarthropathies (include Degree/Grade of displacement in mm) 5. Vertebral body Fractures, including age (old, new/acute) 85. Modic Type Changes 7. Demineralization 8. Bone pathology 9. Central, Lateral Recess, and/or Foraminal Stenosis (include AP diameter of stenosis in mm) 10. Surgical changes (hardware type, status, and presence of fibrosis)  NOTE: Please specify level(s) and laterality. If applicable: Please indicate ROM and/or evidence of instability (>74m displacement between flexion and extension views)    Standing Status:   Future    Standing Expiration Date:   08/18/2020     Order Specific Question:   Reason for Exam (SYMPTOM  OR DIAGNOSIS REQUIRED)    Answer:   Low back pain    Order Specific Question:   Preferred imaging location?    Answer:   [facility]    Order Specific Question:   Call Results- Best Contact Number?    Answer:   [phone]([facility]    Order Specific Question:   Radiology Contrast Protocol - do NOT remove file path    Answer:   \\[email or web address]  . Ambulatory referral to Neurosurgery    Referral Priority:   Routine    Referral Type:   Surgical    Referral Reason:   Specialty Services Required    Requested Specialty:   Neurosurgery    Number of Visits Requested:   1   Follow-up plan:   Return for Procedure (w/ sedation): (R) L1-2 LESI #1.  In addition, today I ordered x-rays of the lumbar spine on flexion and extension as well as an MRI of the lumbar spine, as it would appear that he may be having an L2 compression fracture with retropulsion.     Interventional therapies:  Considering:  Possible bilateral lumbar facet RFA  Possible bilateral cervical facet RFA    Palliative PRN treatment(s):  Palliative right CESI Palliative right L4-5 LESI #2  Palliative bilateral lumbar facet blocks Palliative left lumbar facet RFA #2 (Last done 07/18/17) Palliative bilateral cervical facet blocks    Recent Visits No visits were found meeting these conditions. Showing recent visits within past 90 days and meeting all other requirements Today's Visits Date Type Provider Dept  05/18/20 Telemedicine NMilinda Pointer MD [facility]  Showing today's visits and meeting all other requirements Future Appointments Date Type Provider Dept  05/30/20 Appointment NMilinda Pointer MD [facility]  Showing future appointments within next 90 days and meeting all other requirements  I discussed the assessment and treatment plan with the patient. The patient was  provided an opportunity to ask questions and all were answered. The patient agreed with the plan and demonstrated an understanding of the instructions.  Patient advised to call back or seek an in-person evaluation if the symptoms or condition worsens.  Duration of encounter: 35 minutes.  Note by: FGaspar Cola MD Date: 05/18/2020; Time: 6:20 PM

## 2020-05-18 ENCOUNTER — Ambulatory Visit: Payer: Medicare Other | Attending: Pain Medicine | Admitting: Pain Medicine

## 2020-05-18 ENCOUNTER — Other Ambulatory Visit: Payer: Self-pay

## 2020-05-18 DIAGNOSIS — M5441 Lumbago with sciatica, right side: Secondary | ICD-10-CM | POA: Diagnosis not present

## 2020-05-18 DIAGNOSIS — G894 Chronic pain syndrome: Secondary | ICD-10-CM

## 2020-05-18 DIAGNOSIS — N319 Neuromuscular dysfunction of bladder, unspecified: Secondary | ICD-10-CM

## 2020-05-18 DIAGNOSIS — S32020S Wedge compression fracture of second lumbar vertebra, sequela: Secondary | ICD-10-CM | POA: Insufficient documentation

## 2020-05-18 DIAGNOSIS — M5136 Other intervertebral disc degeneration, lumbar region: Secondary | ICD-10-CM

## 2020-05-18 DIAGNOSIS — S32020A Wedge compression fracture of second lumbar vertebra, initial encounter for closed fracture: Secondary | ICD-10-CM

## 2020-05-18 DIAGNOSIS — M51369 Other intervertebral disc degeneration, lumbar region without mention of lumbar back pain or lower extremity pain: Secondary | ICD-10-CM

## 2020-05-18 DIAGNOSIS — M79604 Pain in right leg: Secondary | ICD-10-CM

## 2020-05-18 DIAGNOSIS — M545 Low back pain: Secondary | ICD-10-CM

## 2020-05-18 DIAGNOSIS — S32009A Unspecified fracture of unspecified lumbar vertebra, initial encounter for closed fracture: Secondary | ICD-10-CM

## 2020-05-18 DIAGNOSIS — M5417 Radiculopathy, lumbosacral region: Secondary | ICD-10-CM

## 2020-05-18 DIAGNOSIS — G8929 Other chronic pain: Secondary | ICD-10-CM

## 2020-05-18 MED ORDER — OXYCODONE HCL 20 MG PO TABS: 20.0000 mg | ORAL_TABLET | Freq: Three times a day (TID) | ORAL | 0 refills | Status: DC | PRN

## 2020-05-18 NOTE — Patient Instructions (Signed)
____________________________________________________________________________________________  Preparing for Procedure with Sedation  Procedure appointments are limited to planned procedures: . No Prescription Refills. . No disability issues will be discussed. . No medication changes will be discussed.  Instructions: . Oral Intake: Do not eat or drink anything for at least 8 hours prior to your procedure. (Exception: Blood Pressure Medication. See below.) . Transportation: Unless otherwise stated by your physician, you may drive yourself after the procedure. . Blood Pressure Medicine: Do not forget to take your blood pressure medicine with a sip of water the morning of the procedure. If your Diastolic (lower reading)is above 100 mmHg, elective cases will be cancelled/rescheduled. . Blood thinners: These will need to be stopped for procedures. Notify our staff if you are taking any blood thinners. Depending on which one you take, there will be specific instructions on how and when to stop it. . Diabetics on insulin: Notify the staff so that you can be scheduled 1st case in the morning. If your diabetes requires high dose insulin, take only  of your normal insulin dose the morning of the procedure and notify the staff that you have done so. . Preventing infections: Shower with an antibacterial soap the morning of your procedure. . Build-up your immune system: Take 1000 mg of Vitamin C with every meal (3 times a day) the day prior to your procedure. . Antibiotics: Inform the staff if you have a condition or reason that requires you to take antibiotics before dental procedures. . Pregnancy: If you are pregnant, call and cancel the procedure. . Sickness: If you have a cold, fever, or any active infections, call and cancel the procedure. . Arrival: You must be in the facility at least 30 minutes prior to your scheduled procedure. . Children: Do not bring children with you. . Dress appropriately:  Bring dark clothing that you would not mind if they get stained. . Valuables: Do not bring any jewelry or valuables.  Reasons to call and reschedule or cancel your procedure: (Following these recommendations will minimize the risk of a serious complication.) . Surgeries: Avoid having procedures within 2 weeks of any surgery. (Avoid for 2 weeks before or after any surgery). . Flu Shots: Avoid having procedures within 2 weeks of a flu shots or . (Avoid for 2 weeks before or after immunizations). . Barium: Avoid having a procedure within 7-10 days after having had a radiological study involving the use of radiological contrast. (Myelograms, Barium swallow or enema study). . Heart attacks: Avoid any elective procedures or surgeries for the initial 6 months after a "Myocardial Infarction" (Heart Attack). . Blood thinners: It is imperative that you stop these medications before procedures. Let us know if you if you take any blood thinner.  . Infection: Avoid procedures during or within two weeks of an infection (including chest colds or gastrointestinal problems). Symptoms associated with infections include: Localized redness, fever, chills, night sweats or profuse sweating, burning sensation when voiding, cough, congestion, stuffiness, runny nose, sore throat, diarrhea, nausea, vomiting, cold or Flu symptoms, recent or current infections. It is specially important if the infection is over the area that we intend to treat. . Heart and lung problems: Symptoms that may suggest an active cardiopulmonary problem include: cough, chest pain, breathing difficulties or shortness of breath, dizziness, ankle swelling, uncontrolled high or unusually low blood pressure, and/or palpitations. If you are experiencing any of these symptoms, cancel your procedure and contact your primary care physician for an evaluation.  Remember:  Regular Business hours are:    Monday to Thursday 8:00 AM to 4:00 PM  Provider's  Schedule: Mykal Kirchman, MD:  Procedure days: Tuesday and Thursday 7:30 AM to 4:00 PM  Bilal Lateef, MD:  Procedure days: Monday and Wednesday 7:30 AM to 4:00 PM ____________________________________________________________________________________________    

## 2020-05-30 ENCOUNTER — Encounter: Payer: Medicare Other | Admitting: Pain Medicine

## 2020-05-31 ENCOUNTER — Encounter: Payer: Medicare Other | Admitting: Pain Medicine

## 2020-06-02 ENCOUNTER — Ambulatory Visit: Payer: Medicare Other | Admitting: Pain Medicine

## 2020-06-03 ENCOUNTER — Ambulatory Visit: Payer: Medicare Other

## 2020-08-29 NOTE — Progress Notes (Signed)
PROVIDER NOTE: Information contained herein reflects review and annotations entered in association with encounter. Interpretation of such information and data should be left to medically-trained personnel. Information provided to patient can be located elsewhere in the medical record under "Patient Instructions". Document created using STT-dictation technology, any transcriptional errors that may result from process are unintentional.    Patient: James [name]  Service Category: E/M  Provider: Gaspar Cola, MD  DOB: 03/07/1956  DOS: 08/31/2020  Specialty: Interventional Pain Management  MRN: [id number]  Setting: Ambulatory outpatient  PCP: Narda Rutherford, MD  Type: Established Patient    Referring Provider: [name], MD  Location: Office  Delivery: Face-to-face     HPI  James [name], a 64 y.o. year old male, is here today because of his Chronic pain syndrome [G89.4]. James [name] primary complain today is Back Pain Last encounter: My last encounter with him was on 05/16/2020. 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); Epidural fibrosis; Chronic musculoskeletal pain; Neurogenic pain; Neuropathic pain; Chronic sacroiliac joint pain (Bilateral)  (R>L); Psoriatic arthritis ([place]); Chronic lower extremity pain (Secondary area of Pain) (Bilateral) (L>R); Psoriasis with arthropathy ([place]); Numbness and tingling of both legs; Abnormal nerve conduction studies (06/20/16); Chronic pain syndrome; Perineal pain; Chest pain with high risk for cardiac etiology; Chronic upper extremity pain(R>L); Cervicalgia (Right); DDD (degenerative disc disease), cervical; Closed fracture of lumbar vertebral body (HCC) (L2); Acute low back pain (Bilateral) w/ sciatica (Right); Neurogenic bladder s/p L2 fracture (04/19/2020); Lumbosacral radiculopathy at L5 (Right); Acute lower extremity pain (Right); Compression fracture of L2 lumbar vertebra, sequela; and Acute exacerbation of chronic low back pain on their pertinent problem list. Pain Assessment: Severity of Chronic pain is reported as a 6 /10. Location: Back Mid/pain radiaties down to his toes. Onset: More than a month ago. Quality: Aching, Burning, Throbbing. Timing: Constant. Modifying factor(s): laying down and meds. Vitals:  height is 6' (1.829 m) and weight is 280 lb (127 kg). His temperature is 97.3 F (36.3 C) (abnormal). His blood pressure is 135/89 and his pulse is 64. His oxygen saturation is 100%.   Reason for encounter: medication management.  The patient indicates doing well with the current medication regimen. No adverse reactions or side effects reported to the medications.  The patient indicates recently having had a fracture of his L2.  He was evaluated and they decided not to do a kyphoplasty.  It seems that nothing really helped the pain until he was given some dexamethasone.  Apparently the dexamethasone did help considerably with this pain.  RTCB: 11/30/2020 Nonopioids transfer 08/31/2020: Zanaflex  Pharmacotherapy Assessment   Analgesic: Oxycodone IR 20 mg, 1 tab PO q 8hrs (60 mg/day of oxycodone) MME/day:120 mg/day.   Monitoring: [place] PMP: PDMP reviewed during this encounter.        Pharmacotherapy: No side-effects or adverse reactions reported. Compliance: No problems identified. Effectiveness: Clinically acceptable.  Chauncey Fischer, RN  08/31/2020  8:41 AM  Sign when Signing Visit Nursing Pain Medication Assessment:  Safety precautions to be maintained throughout the outpatient stay will include: orient to surroundings, keep bed in low position, maintain call bell within reach at all times, provide assistance with transfer out of bed and ambulation.  Medication Inspection Compliance: Pill count conducted under aseptic conditions, in front of the patient. Neither the pills nor the bottle was removed from the patient's sight at any time. Once count was completed pills were immediately returned to the patient in their original bottle.  Medication: Oxycodone IR Pill/Patch Count: 87 of 90 pills remain Pill/Patch Appearance: Markings consistent with prescribed medication Bottle Appearance: Standard pharmacy container. Clearly labeled. Filled Date: 10 / 22 / 21 Last Medication intake:  Today  Chauncey Fischer, RN  08/31/2020  8:38 AM  Sign when Signing Visit Safety precautions to be maintained throughout the outpatient stay will include: orient to surroundings, keep bed in low position, maintain call bell within reach at all times, provide assistance with transfer out of bed and ambulation.     UDS:  Summary  Date Value Ref Range Status  03/08/2020 Note  Final    Comment:    ==================================================================== [employer] [facility] (MW) ==================================================================== Test                             Result       Flag       Units Drug Present and Declared for Prescription Verification   Oxycodone                      2372         EXPECTED   ng/mg creat   Oxymorphone                    2333         EXPECTED   ng/mg creat   Noroxycodone                   >3891        EXPECTED   ng/mg creat    Noroxymorphone                 782          EXPECTED   ng/mg creat    Sources of oxycodone are scheduled prescription medications.    Oxymorphone, noroxycodone, and noroxymorphone are expected    metabolites of oxycodone. Oxymorphone is also available as a    scheduled prescription medication. ==================================================================== Test                      Result    Flag   Units      Ref Range   Creatinine              257              mg/dL      >=20 ==================================================================== Declared Medications:  The flagging and interpretation on this report are based on the  following declared medications.  Unexpected results may arise from  inaccuracies in the declared medications.  **Note: The testing scope of this panel includes these medications:  Oxycodone  **Note: The testing scope of this panel does not include the  following reported medications:  Albuterol  Azelastine  Folic Acid  Loratadine  Methotrexate  Metoprolol (Lopressor)  Multivitamin  Naloxone (Narcan)  Pantoprazole (Protonix)  Prednisone (Deltasone)  Sucralfate (Carafate)  Testosterone  Tizanidine (Zanaflex)  Vitamin B  Vitamin D2 (Ergocalciferol) ==================================================================== For clinical consultation, please call [phone]. ====================================================================      ROS  Constitutional: Denies any fever or chills Gastrointestinal: No reported hemesis, hematochezia, vomiting, or acute GI distress Musculoskeletal: Denies any acute onset joint swelling, redness, loss of ROM, or weakness Neurological: No reported episodes of acute onset apraxia, aphasia, dysarthria, agnosia, amnesia, paralysis, loss of coordination, or loss of consciousness  Medication Review  Ergocalciferol, Oxycodone HCl, albuterol, azelastine, b complex vitamins, loratadine, metoprolol tartrate,  multivitamin, naloxone, sucralfate, and tiZANidine  History Review  Allergy: James [name] is allergic to penicillins, lactose, nsaids, talwin [pentazocine], codeine, procaine, and sulfa antibiotics. Drug: James [name]  reports no history of drug use. Alcohol:  reports no history of alcohol use. Tobacco:  reports that he has quit smoking. His smoking use included cigarettes. His smokeless tobacco use includes chew. Social: James [name]  reports that he has quit smoking. His smoking use included cigarettes. His smokeless tobacco use includes chew. He reports that he does not drink alcohol and does not use drugs. Medical:  has a past medical history of Allergy, Anemia, Arthritis, Asthma, Blood transfusion without reported diagnosis, Bronchitis, Bursitis, Cancer ([place]), Cataract, 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09/13/2015), Emphysema of lung ([place]), Gastritis, GERD (gastroesophageal reflux disease), Heart murmur, Hepatitis C, Hiatal hernia, Hypertension, Hypothyroidism, Kidney stone, Lupus ([place]), Obesity, Osteoporosis, Peripheral nerve disease, Psoriatic arthritis ([place]), Sleep apnea, Supraventricular tachycardia ([place]), Tendonitis, and Thyroid disease. Surgical: James [name]  has a past surgical history that includes Spine surgery; Eye surgery; Fracture surgery (Right); Fracture surgery (Bilateral); Fracture surgery (Bilateral); Joint replacement (Right); Spinal cord stimulator implant; Spinal cord stimulator removal; Patella arthroplasty; Shoulder arthroscopy (Bilateral); Esophagogastroduodenoscopy (egd) with propofol (N/A, 01/20/2016); and Colonoscopy (2009). Family: family history includes Arthritis in his father and mother; COPD in his father; Cancer in his father and mother; Diabetes in his mother; Hematuria in his father and mother; Hypertension in his father and mother; Kidney disease in his mother; Kidney failure in his mother; Prostate cancer in his father; Stroke in  his mother.  Laboratory Chemistry Profile   Renal Lab Results  Component Value Date   BUN 15 08/19/2019   CREATININE 1.47 (H) 08/19/2019   GFRAA 59 (L) 08/19/2019   GFRNONAA 51 (L) 08/19/2019     Hepatic Lab Results  Component Value Date   AST 25 08/19/2019   ALT 24 08/19/2019   ALBUMIN 4.2 08/19/2019   ALKPHOS 47 08/19/2019     Electrolytes Lab Results  Component Value Date   NA 138 08/19/2019   K 4.1 08/19/2019   CL 100 08/19/2019   CALCIUM 9.3 08/19/2019   MG 2.1 08/19/2019     Bone Lab Results  Component Value Date   VD25OH 26.19 (L) 08/19/2019   25OHVITD1 42 12/06/2016   25OHVITD2 <1.0 12/06/2016   25OHVITD3 42 12/06/2016   TESTOSTERONE 285 02/12/2018     Inflammation (CRP: Acute Phase) (ESR: Chronic Phase) Lab Results  Component Value Date   CRP <0.8 08/19/2019   ESRSEDRATE 1 08/19/2019       Note: Above Lab results reviewed.  Recent Imaging Review  MR CERVICAL SPINE WO CONTRAST CLINICAL DATA:  Neck pain worse on the right with radiculopathy. Numbness in the arms and hands with worsening symptoms in the past 6 months.  EXAM: MRI CERVICAL SPINE WITHOUT CONTRAST  TECHNIQUE: Multiplanar, multisequence MR imaging of the cervical spine was performed.  No intravenous contrast was administered.  COMPARISON:  12/13/2016  FINDINGS: Alignment: Chronic cervical spine straightening. Unchanged minimal anterolisthesis of C3 on C4.  Vertebrae: No fracture or suspicious osseous lesion. Chronic degenerative endplate changes at H8-4 with decreased degenerative endplate edema.  Cord: Normal signal.  Posterior Fossa, vertebral arteries, paraspinal tissues: Unremarkable.  Disc levels:  C2-3: Mild right and moderate to severe left facet arthrosis without stenosis, unchanged.  C3-4: Central disc protrusion, uncovertebral spurring, and severe right and moderate left facet arthrosis result in borderline spinal stenosis with slight ventral cord  flattening and moderate right neural foraminal stenosis, unchanged.  C4-5: Minimal disc bulging, mild uncovertebral spurring, and moderate right facet arthrosis without stenosis, unchanged.  C5-6: Moderate disc space narrowing. Disc bulging, uncovertebral spurring, and moderate right and mild left facet arthrosis result in mild spinal stenosis and moderate right greater than left neural foraminal stenosis, unchanged.  C6-7: Severe disc space narrowing. Broad-based posterior disc osteophyte complex results in mild spinal stenosis and mild-to-moderate right and borderline left neural foraminal stenosis, unchanged.  C7-T1: Minimal disc bulging and moderate to severe right and mild left facet arthrosis result in mild right neural foraminal stenosis without spinal stenosis, unchanged.  IMPRESSION: 1. Decreased degenerative endplate edema at O9-6. 2. Otherwise unchanged appearance of the cervical spine with up to mild spinal and moderate neural foraminal stenosis at multiple levels as above.  Electronically Signed   By: Logan Bores M.D.   On: 09/19/2018 10:30 Note: Reviewed        Physical Exam  General appearance: Well nourished, well developed, and well hydrated. In no apparent acute distress Mental status: Alert, oriented x 3 (person, place, & time)       Respiratory: No evidence of acute respiratory distress Eyes: PERLA Vitals: BP 135/89   Pulse 64   Temp (!) 97.3 F (36.3 C)   Ht 6' (1.829 m)   Wt 280 lb (127 kg)   SpO2 100%   BMI 37.97 kg/m  BMI: Estimated body mass index is 37.97 kg/m as calculated from the following:   Height as of this encounter: 6' (1.829 m).   Weight as of this encounter: 280 lb (127 kg). Ideal: Ideal body weight: 77.6 kg (171 lb 1.2 oz) Adjusted ideal body weight: 97.4 kg (214 lb 10.3 oz)  Assessment   Status Diagnosis  Controlled Controlled Controlled 1. Chronic pain syndrome   2. Closed fracture of lumbar vertebral body ([place]) (L2)    3. Chronic low back pain (Primary Area of Pain) (Bilateral) (R>L)   4. 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)   5. Pharmacologic therapy   6. Myofascial pain syndrome, cervical (rhomboid muscles) (intermittent)   7. Chronic musculoskeletal pain      Updated Problems: Problem  Compression Fracture of L2 Lumbar Vertebra, Sequela    Plan of Care  Problem-specific:  No problem-specific Assessment & Plan notes found for this encounter.  James [name] has a current medication list which includes the following long-term medication(s): albuterol, naloxone, [START ON 09/01/2020] oxycodone hcl, [START ON 10/01/2020] oxycodone hcl, [START ON 10/31/2020] oxycodone hcl, and tizanidine.  Pharmacotherapy (Medications Ordered): Meds ordered this encounter  Medications  . tiZANidine (ZANAFLEX) 4 MG tablet    Sig: Take 1 tablet (4 mg total) by mouth 3 (three) times daily.    Dispense:  90 tablet    Refill:  2    Fill one day early if pharmacy is closed on scheduled refill date. May substitute  for generic, or similar, if available. Void any older refills or prescriptions of this medication.  . Oxycodone HCl 20 MG TABS    Sig: Take 1 tablet (20 mg total) by mouth every 8 (eight) hours as needed. Must last 30 days    Dispense:  90 tablet    Refill:  0    Chronic Pain: STOP Act (Not applicable) Fill 1 day early if closed on refill date. Avoid benzodiazepines within 8 hours of opioids  . Oxycodone HCl 20 MG TABS    Sig: Take 1 tablet (20 mg total) by mouth every 8 (eight) hours as needed. Must last 30 days    Dispense:  90 tablet    Refill:  0    Chronic Pain: STOP Act (Not applicable) Fill 1 day early if closed on refill date. Avoid benzodiazepines within 8 hours of opioids  . Oxycodone HCl 20 MG TABS    Sig: Take 1 tablet (20 mg total) by mouth every 8 (eight) hours as needed. Must last 30 days    Dispense:  90 tablet    Refill:  0    Chronic Pain: STOP Act (Not  applicable) Fill 1 day early if closed on refill date. Avoid benzodiazepines within 8 hours of opioids   Orders:  No orders of the defined types were placed in this encounter.  Follow-up plan:   Return in about 13 weeks (around 11/30/2020) for (F2F), (Med Mgmt).      Interventional therapies:  Considering:  Possible bilateral lumbar facet RFA  Possible bilateral cervical facet RFA    Palliative PRN treatment(s):  Palliative right CESI Palliative right L4-5 LESI #2  Palliative bilateral lumbar facet blocks Palliative left lumbar facet RFA #2 (Last done 07/18/17) Palliative bilateral cervical facet blocks     Recent Visits No visits were found meeting these conditions. Showing recent visits within past 90 days and meeting all other requirements Today's Visits Date Type Provider Dept  08/31/20 Office Visit Milinda Pointer, MD [facility]  Showing today's visits and meeting all other requirements Future Appointments Date Type Provider Dept  11/23/20 Appointment Milinda Pointer, MD [facility]  Showing future appointments within next 90 days and meeting all other requirements  I discussed the assessment and treatment plan with the patient. The patient was provided an opportunity to ask questions and all were answered. The patient agreed with the plan and demonstrated an understanding of the instructions.  Patient advised to call back or seek an in-person evaluation if the symptoms or condition worsens.  Duration of encounter: 30 minutes.  Note by: Gaspar Cola, MD Date: 08/31/2020; Time: 9:49 AM

## 2020-08-31 ENCOUNTER — Other Ambulatory Visit: Payer: Self-pay

## 2020-08-31 ENCOUNTER — Encounter: Payer: Self-pay | Admitting: Pain Medicine

## 2020-08-31 ENCOUNTER — Ambulatory Visit: Payer: Medicare Other | Attending: Pain Medicine | Admitting: Pain Medicine

## 2020-08-31 VITALS — BP 135/89 | HR 64 | Temp 97.3°F | Ht 72.0 in | Wt 280.0 lb

## 2020-08-31 DIAGNOSIS — M545 Low back pain, unspecified: Secondary | ICD-10-CM | POA: Insufficient documentation

## 2020-08-31 DIAGNOSIS — Z79899 Other long term (current) drug therapy: Secondary | ICD-10-CM | POA: Diagnosis present

## 2020-08-31 DIAGNOSIS — G894 Chronic pain syndrome: Secondary | ICD-10-CM | POA: Diagnosis present

## 2020-08-31 DIAGNOSIS — M961 Postlaminectomy syndrome, not elsewhere classified: Secondary | ICD-10-CM | POA: Diagnosis not present

## 2020-08-31 DIAGNOSIS — S32009A Unspecified fracture of unspecified lumbar vertebra, initial encounter for closed fracture: Secondary | ICD-10-CM | POA: Diagnosis not present

## 2020-08-31 DIAGNOSIS — M7918 Myalgia, other site: Secondary | ICD-10-CM

## 2020-08-31 DIAGNOSIS — G8929 Other chronic pain: Secondary | ICD-10-CM

## 2020-08-31 MED ORDER — OXYCODONE HCL 20 MG PO TABS: 20.0000 mg | ORAL_TABLET | Freq: Three times a day (TID) | ORAL | 0 refills | Status: DC | PRN

## 2020-08-31 MED ORDER — TIZANIDINE HCL 4 MG PO TABS: 4.0000 mg | ORAL_TABLET | Freq: Three times a day (TID) | ORAL | 2 refills | Status: DC

## 2020-08-31 NOTE — Progress Notes (Signed)
Nursing Pain Medication Assessment:  Safety precautions to be maintained throughout the outpatient stay will include: orient to surroundings, keep bed in low position, maintain call bell within reach at all times, provide assistance with transfer out of bed and ambulation.  Medication Inspection Compliance: Pill count conducted under aseptic conditions, in front of the patient. Neither the pills nor the bottle was removed from the patient's sight at any time. Once count was completed pills were immediately returned to the patient in their original bottle.  Medication: Oxycodone IR Pill/Patch Count: 87 of 90 pills remain Pill/Patch Appearance: Markings consistent with prescribed medication Bottle Appearance: Standard pharmacy container. Clearly labeled. Filled Date: 10 / 22 / 21 Last Medication intake:  Today

## 2020-08-31 NOTE — Patient Instructions (Signed)
____________________________________________________________________________________________  Medication Rules  Purpose: To inform patients, and their family members, of our rules and regulations.  Applies to: All patients receiving prescriptions (written or electronic).  Pharmacy of record: Pharmacy where electronic prescriptions will be sent. If written prescriptions are taken to a different pharmacy, please inform the nursing staff. The pharmacy listed in the electronic medical record should be the one where you would like electronic prescriptions to be sent.  Electronic prescriptions: In compliance with the [place] (STOP) Act of 2017 (Session Lanny Cramp [phone]), effective November 05, 2018, all controlled substances must be electronically prescribed. Calling prescriptions to the pharmacy will cease to exist.  Prescription refills: Only during scheduled appointments. Applies to all prescriptions.  NOTE: The following applies primarily to controlled substances (Opioid* Pain Medications).   Type of encounter (visit): For patients receiving controlled substances, face-to-face visits are required. (Not an option or up to the patient.)  Patient's responsibilities: 1. Pain Pills: Bring all pain pills to every appointment (except for procedure appointments). 2. Pill Bottles: Bring pills in original pharmacy bottle. Always bring the newest bottle. Bring bottle, even if empty. 3. Medication refills: You are responsible for knowing and keeping track of what medications you take and those you need refilled. The day before your appointment: write a list of all prescriptions that need to be refilled. The day of the appointment: give the list to the admitting nurse. Prescriptions will be written only during appointments. No prescriptions will be written on procedure days. If you forget a medication: it will not be "Called in", "Faxed", or "electronically sent".  You will need to get another appointment to get these prescribed. No early refills. Do not call asking to have your prescription filled early. 4. Prescription Accuracy: You are responsible for carefully inspecting your prescriptions before leaving our office. Have the discharge nurse carefully go over each prescription with you, before taking them home. Make sure that your name is accurately spelled, that your address is correct. Check the name and dose of your medication to make sure it is accurate. Check the number of pills, and the written instructions to make sure they are clear and accurate. Make sure that you are given enough medication to last until your next medication refill appointment. 5. Taking Medication: Take medication as prescribed. When it comes to controlled substances, taking less pills or less frequently than prescribed is permitted and encouraged. Never take more pills than instructed. Never take medication more frequently than prescribed.  6. Inform other Doctors: Always inform, all of your healthcare providers, of all the medications you take. 7. Pain Medication from other Providers: You are not allowed to accept any additional pain medication from any other Doctor or Healthcare provider. There are two exceptions to this rule. (see below) In the event that you require additional pain medication, you are responsible for notifying us, as stated below. 8. Medication Agreement: You are responsible for carefully reading and following our Medication Agreement. This must be signed before receiving any prescriptions from our practice. Safely store a copy of your signed Agreement. Violations to the Agreement will result in no further prescriptions. (Additional copies of our Medication Agreement are available upon request.) 9. Laws, Rules, & Regulations: All patients are expected to follow all Federal and [employer], [employer], Rules, [employer]. Ignorance of the Laws does not constitute a  valid excuse.  10. Illegal drugs and Controlled Substances: The use of illegal substances (including, but not limited to marijuana and its  derivatives) and/or the illegal use of any controlled substances is strictly prohibited. Violation of this rule may result in the immediate and permanent discontinuation of any and all prescriptions being written by our practice. The use of any illegal substances is prohibited. 11. Adopted CDC guidelines & recommendations: Target dosing levels will be at or below 60 MME/day. Use of benzodiazepines** is not recommended.  Exceptions: There are only two exceptions to the rule of not receiving pain medications from other Healthcare Providers. 1. Exception #1 (Emergencies): In the event of an emergency (i.e.: accident requiring emergency care), you are allowed to receive additional pain medication. However, you are responsible for: As soon as you are able, call our office [phone], at any time of the day or night, and leave a message stating your name, the date and nature of the emergency, and the name and dose of the medication prescribed. In the event that your call is answered by a member of our staff, make sure to document and save the date, time, and the name of the person that took your information.  2. Exception #2 (Planned Surgery): In the event that you are scheduled by another doctor or dentist to have any type of surgery or procedure, you are allowed (for a period no longer than 30 days), to receive additional pain medication, for the acute post-op pain. However, in this case, you are responsible for picking up a copy of our "Post-op Pain Management for Surgeons" handout, and giving it to your surgeon or dentist. This document is available at our office, and does not require an appointment to obtain it. Simply go to our office during business hours (Monday-Thursday from 8:00 AM to 4:00 PM) (Friday 8:00 AM to 12:00 Noon) or if you have a scheduled appointment  with [place], prior to your surgery, and ask for it by name. In addition, you are responsible for: calling our office [phone], at any time of the day or night, and leaving a message stating your name, name of your surgeon, type of surgery, and date of procedure or surgery. Failure to comply with your responsibilities may result in termination of therapy involving the controlled substances.  *Opioid medications include: morphine, codeine, oxycodone, oxymorphone, hydrocodone, hydromorphone, meperidine, tramadol, tapentadol, buprenorphine, fentanyl, methadone. **Benzodiazepine medications include: diazepam (Valium), alprazolam (Xanax), clonazepam (Klonopine), lorazepam (Ativan), clorazepate (Tranxene), chlordiazepoxide (Librium), estazolam (Prosom), oxazepam (Serax), temazepam (Restoril), triazolam (Halcion) (Last updated: 07/12/2020) ____________________________________________________________________________________________   ____________________________________________________________________________________________  Medication Recommendations and Reminders  Applies to: All patients receiving prescriptions (written and/or electronic).  Medication Rules & Regulations: These rules and regulations exist for your safety and that of others. They are not flexible and neither are we. Dismissing or ignoring them will be considered "non-compliance" with medication therapy, resulting in complete and irreversible termination of such therapy. (See document titled "Medication Rules" for more details.) In all conscience, because of safety reasons, we cannot continue providing a therapy where the patient does not follow instructions.  Pharmacy of record:   Definition: This is the pharmacy where your electronic prescriptions will be sent.   We do not endorse any particular pharmacy, however, we have experienced problems with [facility] not securing enough medication supply for the community.  We do not  restrict you in your choice of pharmacy. However, once we write for your prescriptions, we will NOT be re-sending more prescriptions to fix restricted supply problems created by your pharmacy, or your insurance.   The pharmacy listed in the electronic medical record should be the  one where you want electronic prescriptions to be sent.  If you choose to change pharmacy, simply notify our nursing staff.  Recommendations:  Keep all of your pain medications in a safe place, under lock and key, even if you live alone. We will NOT replace lost, stolen, or damaged medication.  After you fill your prescription, take 1 week's worth of pills and put them away in a safe place. You should keep a separate, properly labeled bottle for this purpose. The remainder should be kept in the original bottle. Use this as your primary supply, until it runs out. Once it's gone, then you know that you have 1 week's worth of medicine, and it is time to come in for a prescription refill. If you do this correctly, it is unlikely that you will ever run out of medicine.  To make sure that the above recommendation works, it is very important that you make sure your medication refill appointments are scheduled at least 1 week before you run out of medicine. To do this in an effective manner, make sure that you do not leave the office without scheduling your next medication management appointment. Always ask the nursing staff to show you in your prescription , when your medication will be running out. Then arrange for the receptionist to get you a return appointment, at least 7 days before you run out of medicine. Do not wait until you have 1 or 2 pills left, to come in. This is very poor planning and does not take into consideration that we may need to cancel appointments due to bad weather, sickness, or emergencies affecting our staff.  DO NOT ACCEPT A "Partial Fill": If for any reason your pharmacy does not have enough pills/tablets  to completely fill or refill your prescription, do not allow for a "partial fill". The law allows the pharmacy to complete that prescription within 72 hours, without requiring a new prescription. If they do not fill the rest of your prescription within those 72 hours, you will need a separate prescription to fill the remaining amount, which we will NOT provide. If the reason for the partial fill is your insurance, you will need to talk to the pharmacist about payment alternatives for the remaining tablets, but again, DO NOT ACCEPT A PARTIAL FILL, unless you can trust your pharmacist to obtain the remainder of the pills within 72 hours.  Prescription refills and/or changes in medication(s):   Prescription refills, and/or changes in dose or medication, will be conducted only during scheduled medication management appointments. (Applies to both, written and electronic prescriptions.)  No refills on procedure days. No medication will be changed or started on procedure days. No changes, adjustments, and/or refills will be conducted on a procedure day. Doing so will interfere with the diagnostic portion of the procedure.  No phone refills. No medications will be "called into the pharmacy".  No Fax refills.  No weekend refills.  No Holliday refills.  No after hours refills.  Remember:  Business hours are:  Monday to Thursday 8:00 AM to 4:00 PM Provider's Schedule: Milinda Pointer, MD - Appointments are:  Medication management: Monday and Wednesday 8:00 AM to 4:00 PM Procedure day: Tuesday and Thursday 7:30 AM to 4:00 PM Gillis Santa, MD - Appointments are:  Medication management: Tuesday and Thursday 8:00 AM to 4:00 PM Procedure day: Monday and Wednesday 7:30 AM to 4:00 PM (Last update: 05/25/2020) ____________________________________________________________________________________________   ____________________________________________________________________________________________  CBD  (cannabidiol) WARNING  Applicable to: All individuals currently  taking or considering taking CBD (cannabidiol) and, more important, all patients taking opioid analgesic controlled substances (pain medication). (Example: oxycodone; oxymorphone; hydrocodone; hydromorphone; morphine; methadone; tramadol; tapentadol; fentanyl; buprenorphine; butorphanol; dextromethorphan; meperidine; codeine; etc.)  Legal status: CBD remains a Schedule I drug prohibited for any use. CBD is illegal with one exception. In the [place], CBD has a limited Transport planner (FDA) approval for the treatment of two specific types of epilepsy disorders. Only one CBD product has been approved by the FDA for this purpose: "Epidiolex". FDA is aware that some companies are marketing products containing cannabis and cannabis-derived compounds in ways that violate the [employer], Drug and Cosmetic Act [facility] Act) and that may put the health and safety of consumers at risk. The FDA, a Federal agency, has not enforced the CBD status since 2018.   Legality: Some manufacturers ship CBD products nationally, which is illegal. Often such products are sold online and are therefore available throughout the country. CBD is openly sold in head shops and health food stores in some states where such sales have not been explicitly legalized. Selling unapproved products with unsubstantiated therapeutic claims is not only a violation of the law, but also can put patients at risk, as these products have not been proven to be safe or effective. Federal illegality makes it difficult to conduct research on CBD.  Reference: "FDA Regulation of Cannabis and Cannabis-Derived Products, Including Cannabidiol (CBD)" - [employer]  Warning: CBD is not FDA approved and has not undergo the same manufacturing controls as prescription  drugs.  This means that the purity and safety of available CBD may be questionable. Most of the time, despite manufacturer's claims, it is contaminated with THC (delta-9-tetrahydrocannabinol - the chemical in marijuana responsible for the "HIGH").  When this is the case, the [facility] contaminant will trigger a positive urine drug screen (UDS) test for Marijuana (carboxy-THC). Because a positive UDS for any illicit substance is a violation of our medication agreement, your opioid analgesics (pain medicine) may be permanently discontinued.  MORE ABOUT CBD  General Information: CBD  is a derivative of the Marijuana (cannabis sativa) plant discovered in 70. It is one of the 113 identified substances found in Marijuana. It accounts for up to 40% of the plant's extract. As of 2018, preliminary clinical studies on CBD included research for the treatment of anxiety, movement disorders, and pain. CBD is available and consumed in multiple forms, including inhalation of smoke or vapor, as an aerosol spray, and by mouth. It may be supplied as an oil containing CBD, capsules, dried cannabis, or as a liquid solution. CBD is thought not to be as psychoactive as THC (delta-9-tetrahydrocannabinol - the chemical in marijuana responsible for the "HIGH"). Studies suggest that CBD may interact with different biological target receptors in the body, including cannabinoid and other neurotransmitter receptors. As of 2018 the mechanism of action for its biological effects has not been determined.  Side-effects   Adverse reactions: Dry mouth, diarrhea, decreased appetite, fatigue, drowsiness, malaise, weakness, sleep disturbances, and others.  Drug interactions: CBC may interact with other medications such as blood-thinners. (Last update: 06/11/2020) ____________________________________________________________________________________________

## 2020-08-31 NOTE — Progress Notes (Signed)
Safety precautions to be maintained throughout the outpatient stay will include: orient to surroundings, keep bed in low position, maintain call bell within reach at all times, provide assistance with transfer out of bed and ambulation.  

## 2020-11-22 DIAGNOSIS — F1120 Opioid dependence, uncomplicated: Secondary | ICD-10-CM | POA: Insufficient documentation
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
## 2020-11-23 ENCOUNTER — Ambulatory Visit: Payer: Medicare Other | Attending: Pain Medicine | Admitting: Pain Medicine

## 2020-11-23 ENCOUNTER — Other Ambulatory Visit: Payer: Self-pay

## 2020-11-23 DIAGNOSIS — S32009A Unspecified fracture of unspecified lumbar vertebra, initial encounter for closed fracture: Secondary | ICD-10-CM

## 2020-11-23 DIAGNOSIS — M961 Postlaminectomy syndrome, not elsewhere classified: Secondary | ICD-10-CM

## 2020-11-23 DIAGNOSIS — F1120 Opioid dependence, uncomplicated: Secondary | ICD-10-CM

## 2020-11-23 DIAGNOSIS — G8929 Other chronic pain: Secondary | ICD-10-CM

## 2020-11-23 DIAGNOSIS — Z79899 Other long term (current) drug therapy: Secondary | ICD-10-CM | POA: Diagnosis not present

## 2020-11-23 DIAGNOSIS — M545 Low back pain, unspecified: Secondary | ICD-10-CM | POA: Diagnosis not present

## 2020-11-23 DIAGNOSIS — G894 Chronic pain syndrome: Secondary | ICD-10-CM

## 2020-11-23 MED ORDER — OXYCODONE HCL 20 MG PO TABS: 20.0000 mg | ORAL_TABLET | Freq: Three times a day (TID) | ORAL | 0 refills | Status: DC | PRN

## 2020-12-05 ENCOUNTER — Encounter: Payer: Medicare Other | Admitting: Pain Medicine

## 2020-12-27 NOTE — Progress Notes (Signed)
PROVIDER NOTE: Information contained herein reflects review and annotations entered in association with encounter. Interpretation of such information and data should be left to medically-trained personnel. Information provided to patient can be located elsewhere in the medical record under "Patient Instructions". Document created using STT-dictation technology, any transcriptional errors that may result from process are unintentional.    Patient: James [name]  Service Category: E/M  Provider: Gaspar Cola, MD  DOB: [birth year]  DOS: 12/28/2020  Specialty: Interventional Pain Management  MRN: [id number]  Setting: Ambulatory outpatient  PCP: Narda Rutherford, MD  Type: Established Patient    Referring Provider: Narda Rutherford, *  Location: Office  Delivery: Face-to-face     HPI  James [name], a 65 y.o. year old male, is here today because of his Chronic pain syndrome [G89.4]. James [name] primary complain today is Back Pain Last encounter: My last encounter with him was on 11/23/2020. 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); Epidural fibrosis; Chronic musculoskeletal pain; Neurogenic pain; Neuropathic pain; Chronic sacroiliac joint pain  (Bilateral) (R>L); Psoriatic arthritis ([place]); Chronic lower extremity pain (Secondary area of Pain) (Bilateral) (L>R); Psoriasis with arthropathy ([place]); Numbness and tingling of both legs; Abnormal nerve conduction studies (06/20/16); Chronic pain syndrome; Perineal pain; Chest pain with high risk for cardiac etiology; Chronic upper extremity pain(R>L); Cervicalgia (Right); DDD (degenerative disc disease), cervical; Closed fracture of lumbar vertebral body ([place]) (L2); Neurogenic bladder s/p L2 fracture (04/19/2020); Lumbosacral radiculopathy at L5 (Right); Compression fracture of L2 lumbar vertebra, sequela; Abnormal MRI, cervical spine (09/19/2018); and Trigger finger, ring finger (Left) on their pertinent problem list. Pain Assessment: Severity of Chronic pain is reported as a 6 /10. Location: Back Lower/pain radiaties down his leg to his foot. Onset: 1 to 4 weeks ago. Quality: Aching,Burning,Shooting,Throbbing. Timing: Constant. Modifying factor(s): meds and rest. Vitals:  height is $RemoveB'5\' 11"'cswjjMFd$  (1.803 m) and weight is 270 lb (122.5 kg). His temperature is 97 F (36.1 C) (abnormal). His blood pressure is 135/77 and his pulse is 59 (abnormal). His oxygen saturation is 99%.   Reason for encounter: medication management.   The patient indicates doing well with the current medication regimen. No adverse reactions or side effects reported to the medications.  However, he did have a lot of questions today regarding whether or not [employer] had a new type of spinal cord stimulator that he could use.  I reminded him that the limiting factor is his cervical spinal stenosis and the fact that we had to remove the last percutaneous lead because of the pressure that it was causing on the spinal cord and the symptoms that he was experiencing.  I reminded him that unless he has a decompression, the chances of adding anything else and there are slim to none.  I also reminded him that the lead paddles occupy more space than the  percutaneous leads and it would actually make things worse.  He understood and accepted.  Addition, the patient also had some questions regarding a left hand, ring finger, trigger finger.  Today I have provided him with some information on how to temporarily improve his condition by putting pressure over the ligamental cyst.  I also informed him that should this maneuver not help, then we will consider doing some injections into the area.  If that in terms also fails, then the neck step would be to get a referral to a hand surgeon for possible surgical removal of the cyst.  He indicated that he wants to first try the pressure over the cyst, since he had attempted to do so in the clinic and he was able to observe some benefit from it.  He was told to give [place] a call if he feels that he needs to have an injection into that area.  He understood and accepted.  RTCB: 03/30/2021 Nonopioids transferred 08/31/2020: Zanaflex  Pharmacotherapy Assessment   Analgesic: Oxycodone IR 20 mg, 1 tab PO q 8hrs (60 mg/day of oxycodone) MME/day:120 mg/day.   Monitoring: [place] PMP: PDMP reviewed during this encounter.       Pharmacotherapy: No side-effects or adverse reactions reported. Compliance: No problems identified. Effectiveness: Clinically acceptable.  Chauncey Fischer, RN  12/28/2020  8:18 AM  Sign when Signing Visit Nursing Pain Medication Assessment:  Safety precautions to be maintained throughout the outpatient stay will include: orient to surroundings, keep bed in low position, maintain call bell within reach at all times, provide assistance with transfer out of bed and ambulation.  Medication Inspection Compliance: Pill count conducted under aseptic conditions, in front of the patient. Neither the pills nor the bottle was removed from the patient's sight at any time. Once count was completed pills were immediately returned to the patient in their original bottle.  Medication: Oxycodone IR Pill/Patch  Count: 83 of 90 pills remain Pill/Patch Appearance: Markings consistent with prescribed medication Bottle Appearance: Standard pharmacy container. Clearly labeled. Filled Date: 2 / 42 / 22 Last Medication intake:  TodaySafety precautions to be maintained throughout the outpatient stay will include: orient to surroundings, keep bed in low position, maintain call bell within reach at all times, provide assistance with transfer out of bed and ambulation.     UDS:  Summary  Date Value Ref Range Status  03/08/2020 Note  Final    Comment:    ==================================================================== [employer] [facility] (MW) ==================================================================== Test                             Result       Flag       Units Drug Present and Declared for Prescription Verification   Oxycodone                      2372         EXPECTED   ng/mg creat   Oxymorphone                    2333         EXPECTED   ng/mg creat   Noroxycodone                   >3891        EXPECTED   ng/mg creat   Noroxymorphone                 782          EXPECTED  ng/mg creat    Sources of oxycodone are scheduled prescription medications.    Oxymorphone, noroxycodone, and noroxymorphone are expected    metabolites of oxycodone. Oxymorphone is also available as a    scheduled prescription medication. ==================================================================== Test                      Result    Flag   Units      Ref Range   Creatinine              257              mg/dL      >=20 ==================================================================== Declared Medications:  The flagging and interpretation on this report are based on the  following declared medications.  Unexpected results may arise from  inaccuracies in the declared medications.  **Note: The testing scope of this panel includes these medications:  Oxycodone  **Note: The testing scope of this panel does  not include the  following reported medications:  Albuterol  Azelastine  Folic Acid  Loratadine  Methotrexate  Metoprolol (Lopressor)  Multivitamin  Naloxone (Narcan)  Pantoprazole (Protonix)  Prednisone (Deltasone)  Sucralfate (Carafate)  Testosterone  Tizanidine (Zanaflex)  Vitamin B  Vitamin D2 (Ergocalciferol) ==================================================================== For clinical consultation, please call [phone]. ====================================================================      ROS  Constitutional: Denies any fever or chills Gastrointestinal: No reported hemesis, hematochezia, vomiting, or acute GI distress Musculoskeletal: Denies any acute onset joint swelling, redness, loss of ROM, or weakness Neurological: No reported episodes of acute onset apraxia, aphasia, dysarthria, agnosia, amnesia, paralysis, loss of coordination, or loss of consciousness  Medication Review  Ergocalciferol, Oxycodone HCl, albuterol, b complex vitamins, loratadine, metoprolol tartrate, multivitamin, naloxone, sucralfate, and tiZANidine  History Review  Allergy: James [name] is allergic to penicillins, lactose, nsaids, talwin [pentazocine], codeine, procaine, and sulfa antibiotics. Drug: James [name]  reports no history of drug use. Alcohol:  reports no history of alcohol use. Tobacco:  reports that he has quit smoking. His smoking use included cigarettes. His smokeless tobacco use includes chew. Social: James [name]  reports that he has quit smoking. His smoking use included cigarettes. His smokeless tobacco use includes chew. He reports that he does not drink alcohol and does not use drugs. Medical:  has a past medical history of Allergy, Anemia, Arthritis, Asthma, Blood transfusion without reported diagnosis, Bronchitis, Bursitis, Cancer ([place]), Cataract, 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09/13/2015), Emphysema of lung ([place]), Gastritis,  GERD (gastroesophageal reflux disease), Heart murmur, Hepatitis C, Hiatal hernia, Hypertension, Hypothyroidism, Kidney stone, Lupus ([place]), Obesity, Osteoporosis, Peripheral nerve disease, Psoriatic arthritis ([place]), Sleep apnea, Supraventricular tachycardia ([place]), Tendonitis, and Thyroid disease. Surgical: Mr. Bazzi  has a past surgical history that includes Spine surgery; Eye surgery; Fracture surgery (Right); Fracture surgery (Bilateral); Fracture surgery (Bilateral); Joint replacement (Right); Spinal cord stimulator implant; Spinal cord stimulator removal; Patella arthroplasty; Shoulder arthroscopy (Bilateral); Esophagogastroduodenoscopy (egd) with propofol (N/A, 01/20/2016); and Colonoscopy (2009). Family: family history includes Arthritis in his father and mother; COPD in his father; Cancer in his father and mother; Diabetes in his mother; Hematuria in his father and mother; Hypertension in his father and mother; Kidney disease in his mother; Kidney failure in his mother; Prostate cancer in his father; Stroke in his mother.  Laboratory Chemistry Profile   Renal Lab Results  Component Value Date   BUN 15 08/19/2019   CREATININE 1.47 (H) 08/19/2019   GFRAA 59 (L) 08/19/2019   GFRNONAA 51 (L) 08/19/2019  Hepatic Lab Results  Component Value Date   AST 25 08/19/2019   ALT 24 08/19/2019   ALBUMIN 4.2 08/19/2019   ALKPHOS 47 08/19/2019     Electrolytes Lab Results  Component Value Date   NA 138 08/19/2019   K 4.1 08/19/2019   CL 100 08/19/2019   CALCIUM 9.3 08/19/2019   MG 2.1 08/19/2019     Bone Lab Results  Component Value Date   VD25OH 26.19 (L) 08/19/2019   25OHVITD1 42 12/06/2016   25OHVITD2 <1.0 12/06/2016   25OHVITD3 42 12/06/2016   TESTOSTERONE 285 02/12/2018     Inflammation (CRP: Acute Phase) (ESR: Chronic Phase) Lab Results  Component Value Date   CRP <0.8 08/19/2019   ESRSEDRATE 1 08/19/2019       Note: Above Lab results reviewed.  Recent Imaging  Review  MR CERVICAL SPINE WO CONTRAST CLINICAL DATA:  Neck pain worse on the right with radiculopathy. Numbness in the arms and hands with worsening symptoms in the past 6 months.  EXAM: MRI CERVICAL SPINE WITHOUT CONTRAST  TECHNIQUE: Multiplanar, multisequence MR imaging of the cervical spine was performed. No intravenous contrast was administered.  COMPARISON:  12/13/2016  FINDINGS: Alignment: Chronic cervical spine straightening. Unchanged minimal anterolisthesis of C3 on C4.  Vertebrae: No fracture or suspicious osseous lesion. Chronic degenerative endplate changes at C6-7 with decreased degenerative endplate edema.  Cord: Normal signal.  Posterior Fossa, vertebral arteries, paraspinal tissues: Unremarkable.  Disc levels:  C2-3: Mild right and moderate to severe left facet arthrosis without stenosis, unchanged.  C3-4: Central disc protrusion, uncovertebral spurring, and severe right and moderate left facet arthrosis result in borderline spinal stenosis with slight ventral cord flattening and moderate right neural foraminal stenosis, unchanged.  C4-5: Minimal disc bulging, mild uncovertebral spurring, and moderate right facet arthrosis without stenosis, unchanged.  C5-6: Moderate disc space narrowing. Disc bulging, uncovertebral spurring, and moderate right and mild left facet arthrosis result in mild spinal stenosis and moderate right greater than left neural foraminal stenosis, unchanged.  C6-7: Severe disc space narrowing. Broad-based posterior disc osteophyte complex results in mild spinal stenosis and mild-to-moderate right and borderline left neural foraminal stenosis, unchanged.  C7-T1: Minimal disc bulging and moderate to severe right and mild left facet arthrosis result in mild right neural foraminal stenosis without spinal stenosis, unchanged.  IMPRESSION: 1. Decreased degenerative endplate edema at O7-0. 2. Otherwise unchanged appearance of the  cervical spine with up to mild spinal and moderate neural foraminal stenosis at multiple levels as above.  Electronically Signed   By: Sebastian Ache M.D.   On: 09/19/2018 10:30 Note: Reviewed        Physical Exam  General appearance: Well nourished, well developed, and well hydrated. In no apparent acute distress Mental status: Alert, oriented x 3 (person, place, & time)       Respiratory: No evidence of acute respiratory distress Eyes: PERLA Vitals: BP 135/77   Pulse (!) 59   Temp (!) 97 F (36.1 C)   Ht 5\' 11"  (1.803 m)   Wt 270 lb (122.5 kg)   SpO2 99%   BMI 37.66 kg/m  BMI: Estimated body mass index is 37.66 kg/m as calculated from the following:   Height as of this encounter: 5\' 11"  (1.803 m).   Weight as of this encounter: 270 lb (122.5 kg). Ideal: Ideal body weight: 75.3 kg (166 lb 0.1 oz) Adjusted ideal body weight: 94.2 kg (207 lb 9.7 oz)  Assessment   Status Diagnosis  Controlled Controlled  Controlled 1. Chronic pain syndrome   2. Trigger finger, ring finger (Left)   3. Cervicalgia (Right)   4. Chronic low back pain (Primary Area of Pain) (Bilateral) (R>L)   5. Closed fracture of lumbar vertebral body (HCC) (L2)   6. 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 Roys)   7. Pharmacologic therapy   8. Uncomplicated opioid dependence (HCC)   9. Abnormal MRI, cervical spine (09/19/2018)      Updated Problems: Problem  Abnormal MRI, cervical spine (09/19/2018)   FINDINGS: Alignment: Chronic cervical spine straightening. Unchanged minimal anterolisthesis of C3 on C4.  Vertebrae: No fracture or suspicious osseous lesion. Chronic degenerative endplate changes at C6-7 with decreased degenerative endplate edema.  Cord: Normal signal.  Posterior Fossa, vertebral arteries, paraspinal tissues: Unremarkable.  Disc levels:  C2-3: Mild right and moderate to severe left facet arthrosis without stenosis, unchanged.  C3-4: Central disc  protrusion, uncovertebral spurring, and severe right and moderate left facet arthrosis result in borderline spinal stenosis with slight ventral cord flattening and moderate right neural foraminal stenosis, unchanged.  C4-5: Minimal disc bulging, mild uncovertebral spurring, and moderate right facet arthrosis without stenosis, unchanged.  C5-6: Moderate disc space narrowing. Disc bulging, uncovertebral spurring, and moderate right and mild left facet arthrosis result in mild spinal stenosis and moderate right greater than left neural foraminal stenosis, unchanged.  C6-7: Severe disc space narrowing. Broad-based posterior disc osteophyte complex results in mild spinal stenosis and mild-to-moderate right and borderline left neural foraminal stenosis, unchanged.  C7-T1: Minimal disc bulging and moderate to severe right and mild left facet arthrosis result in mild right neural foraminal stenosis without spinal stenosis, unchanged.  IMPRESSION: 1. Decreased degenerative endplate edema at U6-9. 2. Otherwise unchanged appearance of the cervical spine with up to mild spinal and moderate neural foraminal stenosis at multiple levels as above.   Electronically Signed   By: Sebastian Ache M.D.   On: 09/19/2018 10:30   Trigger finger, ring finger (Left)    Plan of Care  Problem-specific:  No problem-specific Assessment & Plan notes found for this encounter.  James [name] has a current medication list which includes the following long-term medication(s): albuterol, naloxone, [START ON 12/30/2020] oxycodone hcl, [START ON 01/29/2021] oxycodone hcl, [START ON 02/28/2021] oxycodone hcl, and tizanidine.  Pharmacotherapy (Medications Ordered): Meds ordered this encounter  Medications  . Oxycodone HCl 20 MG TABS    Sig: Take 1 tablet (20 mg total) by mouth every 8 (eight) hours as needed. Must last 30 days    Dispense:  90 tablet    Refill:  0    Chronic Pain: STOP Act (Not  applicable) Fill 1 day early if closed on refill date. Avoid benzodiazepines within 8 hours of opioids  . Oxycodone HCl 20 MG TABS    Sig: Take 1 tablet (20 mg total) by mouth every 8 (eight) hours as needed. Must last 30 days    Dispense:  90 tablet    Refill:  0    Chronic Pain: STOP Act (Not applicable) Fill 1 day early if closed on refill date. Avoid benzodiazepines within 8 hours of opioids  . Oxycodone HCl 20 MG TABS    Sig: Take 1 tablet (20 mg total) by mouth every 8 (eight) hours as needed. Must last 30 days    Dispense:  90 tablet    Refill:  0    Chronic Pain: STOP Act (Not applicable) Fill 1 day early if closed on refill date. Avoid  benzodiazepines within 8 hours of opioids   Orders:  No orders of the defined types were placed in this encounter.  Follow-up plan:   Return in about 3 months (around 03/30/2021) for (F2F), (Med Mgmt).      Interventional therapies:  Considering:  Possible bilateral lumbar facet RFA  Possible bilateral cervical facet RFA    Palliative PRN treatment(s):  Palliative right CESI Palliative right L4-5 LESI #2  Palliative bilateral lumbar facet blocks Palliative left lumbar facet RFA #2 (Last done 07/18/17) Palliative bilateral cervical facet blocks       Recent Visits Date Type Provider Dept  11/23/20 Office Visit Milinda Pointer, MD [facility]  Showing recent visits within past 90 days and meeting all other requirements Today's Visits Date Type Provider Dept  12/28/20 Office Visit Milinda Pointer, MD [facility]  Showing today's visits and meeting all other requirements Future Appointments No visits were found meeting these conditions. Showing future appointments within next 90 days and meeting all other requirements  I discussed the assessment and treatment plan with the patient. The patient was provided an opportunity to ask questions and all were answered. The patient agreed with the plan and demonstrated  an understanding of the instructions.  Patient advised to call back or seek an in-person evaluation if the symptoms or condition worsens.  Duration of encounter: 35 minutes.  Note by: Gaspar Cola, MD Date: 12/28/2020; Time: 10:56 AM

## 2020-12-28 ENCOUNTER — Other Ambulatory Visit: Payer: Self-pay

## 2020-12-28 ENCOUNTER — Ambulatory Visit: Payer: Medicare Other | Attending: Pain Medicine | Admitting: Pain Medicine

## 2020-12-28 ENCOUNTER — Encounter: Payer: Self-pay | Admitting: Pain Medicine

## 2020-12-28 VITALS — BP 135/77 | HR 59 | Temp 97.0°F | Ht 71.0 in | Wt 270.0 lb

## 2020-12-28 DIAGNOSIS — R937 Abnormal findings on diagnostic imaging of other parts of musculoskeletal system: Secondary | ICD-10-CM | POA: Diagnosis present

## 2020-12-28 DIAGNOSIS — M961 Postlaminectomy syndrome, not elsewhere classified: Secondary | ICD-10-CM | POA: Insufficient documentation

## 2020-12-28 DIAGNOSIS — G894 Chronic pain syndrome: Secondary | ICD-10-CM | POA: Diagnosis not present

## 2020-12-28 DIAGNOSIS — M545 Low back pain, unspecified: Secondary | ICD-10-CM | POA: Diagnosis present

## 2020-12-28 DIAGNOSIS — Z79899 Other long term (current) drug therapy: Secondary | ICD-10-CM | POA: Diagnosis present

## 2020-12-28 DIAGNOSIS — G8929 Other chronic pain: Secondary | ICD-10-CM | POA: Diagnosis present

## 2020-12-28 DIAGNOSIS — F1120 Opioid dependence, uncomplicated: Secondary | ICD-10-CM | POA: Insufficient documentation

## 2020-12-28 DIAGNOSIS — S32009A Unspecified fracture of unspecified lumbar vertebra, initial encounter for closed fracture: Secondary | ICD-10-CM | POA: Insufficient documentation

## 2020-12-28 DIAGNOSIS — M542 Cervicalgia: Secondary | ICD-10-CM | POA: Diagnosis not present

## 2020-12-28 DIAGNOSIS — M65342 Trigger finger, left ring finger: Secondary | ICD-10-CM | POA: Insufficient documentation

## 2020-12-28 MED ORDER — OXYCODONE HCL 20 MG PO TABS: 20.0000 mg | ORAL_TABLET | Freq: Three times a day (TID) | ORAL | 0 refills | Status: DC | PRN

## 2020-12-28 MED ORDER — OXYCODONE HCL 20 MG PO TABS
20.0000 mg | ORAL_TABLET | Freq: Three times a day (TID) | ORAL | 0 refills | Status: DC | PRN
Start: 2021-02-28 — End: 2021-03-21

## 2020-12-28 MED ORDER — OXYCODONE HCL 20 MG PO TABS
20.0000 mg | ORAL_TABLET | Freq: Three times a day (TID) | ORAL | 0 refills | Status: DC | PRN
Start: 2021-01-29 — End: 2021-03-21

## 2020-12-28 NOTE — Progress Notes (Signed)
Nursing Pain Medication Assessment:  Safety precautions to be maintained throughout the outpatient stay will include: orient to surroundings, keep bed in low position, maintain call bell within reach at all times, provide assistance with transfer out of bed and ambulation.  Medication Inspection Compliance: Pill count conducted under aseptic conditions, in front of the patient. Neither the pills nor the bottle was removed from the patient's sight at any time. Once count was completed pills were immediately returned to the patient in their original bottle.  Medication: Oxycodone IR Pill/Patch Count: 83 of 90 pills remain Pill/Patch Appearance: Markings consistent with prescribed medication Bottle Appearance: Standard pharmacy container. Clearly labeled. Filled Date: 2 / 49 / 22 Last Medication intake:  TodaySafety precautions to be maintained throughout the outpatient stay will include: orient to surroundings, keep bed in low position, maintain call bell within reach at all times, provide assistance with transfer out of bed and ambulation.

## 2021-03-21 DIAGNOSIS — Z79891 Long term (current) use of opiate analgesic: Secondary | ICD-10-CM | POA: Insufficient documentation

## 2021-03-21 NOTE — Progress Notes (Signed)
PROVIDER NOTE: Information contained herein reflects review and annotations entered in association with encounter. Interpretation of such information and data should be left to medically-trained personnel. Information provided to patient can be located elsewhere in the medical record under "Patient Instructions". Document created using STT-dictation technology, any transcriptional errors that may result from process are unintentional.    Patient: James [name]  Service Category: E/M  Provider: Gaspar Cola, MD  DOB: Dec 09, 1955  DOS: 03/22/2021  Specialty: Interventional Pain Management  MRN: [id number]  Setting: Ambulatory outpatient  PCP: Sheffield Slider, MD  Type: Established Patient    Referring Provider: Narda Rutherford, *  Location: Office  Delivery: Face-to-face     HPI  [name], a 64 y.o. year old male, is here today because of his Chronic pain syndrome [G89.4]. [name] primary complain today is Back Pain (Upper, mid and lower) Last encounter: My last encounter with him was on 12/28/2020. Pertinent problems: 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); Epidural fibrosis; Chronic musculoskeletal pain; Neurogenic pain; Neuropathic pain; Chronic sacroiliac joint pain  (Bilateral) (R>L); Psoriatic arthritis ([place]); Chronic lower extremity pain (2ry area of Pain) (Bilateral) (L>R); Psoriasis with arthropathy ([place]); Numbness and tingling of both legs; Abnormal nerve conduction studies (06/20/16); Chronic pain syndrome; Perineal pain; Chest pain with high risk for cardiac etiology; Chronic upper extremity pain(R>L); Cervicalgia; DDD (degenerative disc disease), cervical; Closed fracture of lumbar vertebral body ([place]) (L2); Neurogenic bladder s/p L2 fracture (04/19/2020); Lumbosacral radiculopathy at L5 (Right); Compression fracture of L2 lumbar vertebra, sequela; Abnormal MRI, cervical spine (09/19/2018); Trigger finger, ring finger (Left); and Occipital neuralgia (midline) on their pertinent problem list. Pain Assessment: Severity of Chronic pain is reported as a 6 /10. Location: Back Upper/mid and lower, legs, arms. Onset: More than a month ago. Quality: Constant,Spasm,Aching,Sharp,Shooting. Timing: Constant. Modifying factor(s): medications, rest. Vitals:  height is $RemoveB'6\' 4"'fmvQagxe$  (1.93 m) and weight is 270 lb (122.5 kg). His temporal temperature is 97.9 F (36.6 C). His blood pressure is 120/79 and his pulse is 63. His respiration is 16 and oxygen saturation is 99%.   Reason for encounter: medication management.   The patient indicates doing well with the current medication regimen. No adverse reactions or side effects reported to the medications.   Today he comes in indicating significant pain in the midline of the occipital ridge.  Physical exam shows exquisite tenderness to palpation over the affected area.  This is causing some decreased range of motion where he is not able to turn his head to either side because it exacerbates the pain.  He does have a longstanding history of cervical DDD, cervical facet disease, and cervicogenic headaches.  His last cervical MRI was done in 2019.  Right now he is not having any radicular symptoms, but he seems to be showing problems with upper  cervical spine DJD.  Today we will be ordering x-rays of the cervical spine and we will be scheduling him  to return for a diagnostic/therapeutic midline occipital nerve block (third occipital), under fluoroscopic guidance and IV sedation.  The patient has requested a sedation due to the acute nature of his pain.  This plan was shared with the patient who understood and accepted.  RTCB: 06/28/2021 Nonopioids transferred 08/31/2020: Zanaflex  Pharmacotherapy Assessment   Analgesic: Oxycodone IR 20 mg, 1 tab PO q 8hrs (60 mg/day of oxycodone) MME/day:120 mg/day.   Monitoring:  PMP: PDMP reviewed during this encounter.       Pharmacotherapy: No side-effects or adverse reactions reported. Compliance: No problems identified. Effectiveness: Clinically acceptable.  Hart Rochester, RN  03/22/2021  8:53 AM  Signed Nursing Pain Medication Assessment:  Safety precautions to be maintained throughout the outpatient stay will include: orient to surroundings, keep bed in low position, maintain call bell within reach at all times, provide assistance with transfer out of bed and ambulation.  Medication Inspection Compliance: Pill count conducted under aseptic conditions, in front of the patient. Neither the pills nor the bottle was removed from the patient's sight at any time. Once count was completed pills were immediately returned to the patient in their original bottle.  Medication: Oxycodone IR Pill/Patch Count: 10 of 90 pills remain Pill/Patch Appearance: Markings consistent with prescribed medication Bottle Appearance: Standard pharmacy container. Clearly labeled. Filled Date: 04 / 21 / 2022 Last Medication intake:  Today    UDS:  Summary  Date Value Ref Range Status  03/08/2020 Note  Final    Comment:    ==================================================================== [employer] Select 13 (MW) ==================================================================== Test                              Result       Flag       Units Drug Present and Declared for Prescription Verification   Oxycodone                      2372         EXPECTED   ng/mg creat   Oxymorphone                    2333         EXPECTED   ng/mg creat   Noroxycodone                   >3891        EXPECTED   ng/mg creat   Noroxymorphone                 782          EXPECTED   ng/mg creat    Sources of oxycodone are scheduled prescription medications.    Oxymorphone, noroxycodone, and noroxymorphone are expected    metabolites of oxycodone. Oxymorphone is also available as a    scheduled prescription medication. ==================================================================== Test                      Result    Flag   Units      Ref Range   Creatinine              257              mg/dL      >=20 ==================================================================== Declared Medications:  The flagging and interpretation on this report are based on the  following declared medications.  Unexpected results may arise from  inaccuracies  in the declared medications.  **Note: The testing scope of this panel includes these medications:  Oxycodone  **Note: The testing scope of this panel does not include the  following reported medications:  Albuterol  Azelastine  Folic Acid  Loratadine  Methotrexate  Metoprolol (Lopressor)  Multivitamin  Naloxone (Narcan)  Pantoprazole (Protonix)  Prednisone (Deltasone)  Sucralfate (Carafate)  Testosterone  Tizanidine (Zanaflex)  Vitamin B  Vitamin D2 (Ergocalciferol) ==================================================================== For clinical consultation, please call [phone]. ====================================================================      ROS  Constitutional: Denies any fever or chills Gastrointestinal: No reported hemesis, hematochezia, vomiting, or acute GI distress Musculoskeletal: Denies any acute onset joint swelling, redness, loss  of ROM, or weakness Neurological: No reported episodes of acute onset apraxia, aphasia, dysarthria, agnosia, amnesia, paralysis, loss of coordination, or loss of consciousness  Medication Review  Ergocalciferol, Oxycodone HCl, albuterol, b complex vitamins, loratadine, metoprolol tartrate, multivitamin, naloxone, and tiZANidine  History Review  Allergy: [name] is allergic to penicillins, lactose, nsaids, talwin [pentazocine], codeine, procaine, and sulfa antibiotics. Drug: [name]  reports no history of drug use. Alcohol:  reports no history of alcohol use. Tobacco:  reports that he has quit smoking. His smoking use included cigarettes. His smokeless tobacco use includes chew. Social: Mr. Pile  reports that he has quit smoking. His smoking use included cigarettes. His smokeless tobacco use includes chew. He reports that he does not drink alcohol and does not use drugs. Medical:  has a past medical history of Allergy, Anemia, Arthritis, Asthma, Blood transfusion without reported diagnosis, Bronchitis, Bursitis, Cancer ([place]), Cataract, 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09/13/2015), Emphysema of lung ([place]), Gastritis, GERD (gastroesophageal reflux disease), Heart murmur, Hepatitis C, Hiatal hernia, Hypertension, Hypothyroidism, Kidney stone, Lupus ([place]), Obesity, Osteoporosis, Peripheral nerve disease, Psoriatic arthritis ([place]), Sleep apnea, Supraventricular tachycardia ([place]), Tendonitis, and Thyroid disease. Surgical: Mr. Oats  has a past surgical history that includes Spine surgery; Eye surgery; Fracture surgery (Right); Fracture surgery (Bilateral); Fracture surgery (Bilateral); Joint replacement (Right); Spinal cord stimulator implant; Spinal cord stimulator removal; Patella arthroplasty; Shoulder arthroscopy (Bilateral); Esophagogastroduodenoscopy (egd) with propofol (N/A, 01/20/2016); and Colonoscopy (2009). Family: family history includes  Arthritis in his father and mother; COPD in his father; Cancer in his father and mother; Diabetes in his mother; Hematuria in his father and mother; Hypertension in his father and mother; Kidney disease in his mother; Kidney failure in his mother; Prostate cancer in his father; Stroke in his mother.  Laboratory Chemistry Profile   Renal Lab Results  Component Value Date   BUN 15 08/19/2019   CREATININE 1.47 (H) 08/19/2019   GFRAA 59 (L) 08/19/2019   GFRNONAA 51 (L) 08/19/2019     Hepatic Lab Results  Component Value Date   AST 25 08/19/2019   ALT 24 08/19/2019   ALBUMIN 4.2 08/19/2019   ALKPHOS 47 08/19/2019     Electrolytes Lab Results  Component Value Date   NA 138 08/19/2019   K 4.1 08/19/2019   CL 100 08/19/2019   CALCIUM 9.3 08/19/2019   MG 2.1 08/19/2019     Bone Lab Results  Component Value Date   VD25OH 26.19 (L) 08/19/2019   25OHVITD1 42 12/06/2016   25OHVITD2 <1.0 12/06/2016   25OHVITD3 42 12/06/2016   TESTOSTERONE 285 02/12/2018     Inflammation (CRP: Acute Phase) (ESR: Chronic Phase) Lab Results  Component Value Date   CRP <0.8 08/19/2019   ESRSEDRATE 1 08/19/2019       Note: Above Lab results reviewed.  Recent Imaging Review  MR CERVICAL SPINE WO CONTRAST CLINICAL DATA:  Neck pain worse on the right with radiculopathy. Numbness in the arms and hands with worsening symptoms in the past 6 months.  EXAM: MRI CERVICAL SPINE WITHOUT CONTRAST  TECHNIQUE: Multiplanar, multisequence MR imaging of the cervical spine was performed. No intravenous contrast was administered.  COMPARISON:  12/13/2016  FINDINGS: Alignment: Chronic cervical spine straightening. Unchanged minimal anterolisthesis of C3 on C4.  Vertebrae: No fracture or suspicious osseous lesion. Chronic degenerative endplate changes at C1-4 with decreased degenerative endplate edema.  Cord: Normal signal.  Posterior Fossa, vertebral arteries, paraspinal  tissues: Unremarkable.  Disc levels:  C2-3: Mild right and moderate to severe left facet arthrosis without stenosis, unchanged.  C3-4: Central disc protrusion, uncovertebral spurring, and severe right and moderate left facet arthrosis result in borderline spinal stenosis with slight ventral cord flattening and moderate right neural foraminal stenosis, unchanged.  C4-5: Minimal disc bulging, mild uncovertebral spurring, and moderate right facet arthrosis without stenosis, unchanged.  C5-6: Moderate disc space narrowing. Disc bulging, uncovertebral spurring, and moderate right and mild left facet arthrosis result in mild spinal stenosis and moderate right greater than left neural foraminal stenosis, unchanged.  C6-7: Severe disc space narrowing. Broad-based posterior disc osteophyte complex results in mild spinal stenosis and mild-to-moderate right and borderline left neural foraminal stenosis, unchanged.  C7-T1: Minimal disc bulging and moderate to severe right and mild left facet arthrosis result in mild right neural foraminal stenosis without spinal stenosis, unchanged.  IMPRESSION: 1. Decreased degenerative endplate edema at G8-1. 2. Otherwise unchanged appearance of the cervical spine with up to mild spinal and moderate neural foraminal stenosis at multiple levels as above.  Electronically Signed   By: Logan Bores M.D.   On: 09/19/2018 10:30 Note: Reviewed        Physical Exam  General appearance: Well nourished, well developed, and well hydrated. In no apparent acute distress Mental status: Alert, oriented x 3 (person, place, & time)       Respiratory: No evidence of acute respiratory distress Eyes: PERLA Vitals: BP 120/79 (BP Location: Right Arm, Patient Position: Sitting, Cuff Size: Large)   Pulse 63   Temp 97.9 F (36.6 C) (Temporal)   Resp 16   Ht 6\' 4"  (1.93 m)   Wt 270 lb (122.5 kg)   SpO2 99%   BMI 32.87 kg/m  BMI: Estimated body mass index is 32.87  kg/m as calculated from the following:   Height as of this encounter: 6\' 4"  (1.93 m).   Weight as of this encounter: 270 lb (122.5 kg). Ideal: Ideal body weight: 86.8 kg (191 lb 5.7 oz) Adjusted ideal body weight: 101.1 kg (222 lb 13 oz)  Assessment   Status Diagnosis  Controlled Controlled Controlled 1. Chronic pain syndrome   2. Chronic low back pain (1ry area of Pain) (Bilateral) (R>L)   3. Chronic lower extremity pain (2ry area of Pain) (Bilateral) (L>R)   4. Cervicalgia (Right)   5. Cervical spondylosis (C7 intravertebral body cyst)   6. Chronic cervical radicular pain (Right)   7. Chronic knee pain (Bilateral) (R>L)   8. 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 Pharmacologic therapy   10. Chronic use of opiate for therapeutic purpose   11. Uncomplicated opioid dependence ([place])   12. Occipital neuralgia, unspecified laterality   13. DDD (degenerative disc disease), cervical      Updated Problems: Problem  Occipital neuralgia (midline)  Cervicalgia  Plan of Care  Problem-specific:  No problem-specific Assessment & Plan notes found for this encounter.  [name] has a current medication list which includes the following long-term medication(s): albuterol, naloxone, [START ON 03/30/2021] oxycodone hcl, [START ON 04/29/2021] oxycodone hcl, [START ON 05/29/2021] oxycodone hcl, and tizanidine.  Pharmacotherapy (Medications Ordered): Meds ordered this encounter  Medications  . Oxycodone HCl 20 MG TABS    Sig: Take 1 tablet (20 mg total) by mouth every 8 (eight) hours as needed. Must last 30 days    Dispense:  90 tablet    Refill:  0    Not a duplicate. Do NOT delete! Dispense 1 day early if closed on refill date. Avoid benzodiazepines within 8 hours of opioids. Do not send refill requests.  . Oxycodone HCl 20 MG TABS    Sig: Take 1 tablet (20 mg total) by mouth every 8 (eight) hours as needed. Must last 30 days    Dispense:  90 tablet     Refill:  0    Not a duplicate. Do NOT delete! Dispense 1 day early if closed on refill date. Avoid benzodiazepines within 8 hours of opioids. Do not send refill requests.  . Oxycodone HCl 20 MG TABS    Sig: Take 1 tablet (20 mg total) by mouth every 8 (eight) hours as needed. Must last 30 days    Dispense:  90 tablet    Refill:  0    Not a duplicate. Do NOT delete! Dispense 1 day early if closed on refill date. Avoid benzodiazepines within 8 hours of opioids. Do not send refill requests.   Orders:  Orders Placed This Encounter  Procedures  . GREATER OCCIPITAL NERVE BLOCK    Standing Status:   Future    Standing Expiration Date:   06/22/2021    Scheduling Instructions:     Procedure: Occipital nerve block     Laterality: Midline     Sedation: With Sedation.     Timeframe: ASAA    Order Specific Question:   Where will this procedure be performed?    Answer:   [facility]  . DG Cervical Spine With Flex & Extend    Patient presents with axial pain with possible radicular component.  Please evaluate for any evidence of cervical spine instability. Describe the presence of any spondylolisthesis (Antero- or retrolisthesis). If present, provide displacement "Grade" and measurement in cm. Please describe presence and specific location (Level & Laterality) of any signs of  osteoarthritis, zygapophyseal (Facet) joints DJD (including decreased joint space and/or osteophytosis), DDD, Foraminal narrowing, as well as any sclerosis and/or cyst formation. Please comment on ROM. In addition to any acute findings, please report on:  1. Facet (Zygapophyseal) joint DJD (Hypertrophy, space narrowing, subchondral sclerosis, and/or osteophyte formation) 2. DDD and/or IVDD (Loss of disc height, desiccation or "Black disc disease") 3. Pars defects 4. Spondylolisthesis, spondylosis, and/or spondyloarthropathies (include Degree/Grade of displacement in mm) 5. Vertebral body Fractures, including age  (old, new/acute) 28. Modic Type Changes 7. Demineralization 8. Bone pathology 9. Central, Lateral Recess, and/or Foraminal Stenosis (include AP diameter of stenosis in mm) 10. Surgical changes (hardware type, status, and presence of fibrosis) NOTE: Please specify level(s) and laterality. If applicable: Please indicate ROM and/or evidence of instability (>58mm displacement between flexion and extension views)    Standing Status:   Future    Standing Expiration Date:   04/22/2021    Scheduling Instructions:     Imaging must be done as  soon as possible. Inform patient that order will expire within 30 days and I will not renew it.    Order Specific Question:   Reason for Exam (SYMPTOM  OR DIAGNOSIS REQUIRED)    Answer:   Cervicalgia    Order Specific Question:   Preferred imaging location?    Answer:   [facility]    Order Specific Question:   Call Results- Best Contact Number?    Answer:   [phone] ([facility])    Order Specific Question:   Radiology Contrast Protocol - do NOT remove file path    Answer:   [email or web address]    Order Specific Question:   Release to patient    Answer:   Immediate  . ToxASSURE Select 13 (MW), Urine    Volume: 30 ml(s). Minimum 3 ml of urine is needed. Document temperature of fresh sample. Indications: Long term (current) use of opiate analgesic (U02.334)    Order Specific Question:   Release to patient    Answer:   Immediate   Follow-up plan:   Return in about 14 weeks (around 06/28/2021) for evaluation day (F2F) (MM), in addition, Procedure (w/ sedation): (ML) ONB #1.      Interventional therapies:  Considering:  Possible bilateral lumbar facet RFA  Possible bilateral cervical facet RFA    Palliative PRN treatment(s):  Palliative right CESI Palliative right L4-5 LESI #2  Palliative bilateral lumbar facet blocks Palliative left lumbar facet RFA #2 (Last done 07/18/17) Palliative bilateral  cervical facet blocks    Recent Visits Date Type Provider Dept  12/28/20 Office Visit Milinda Pointer, MD [facility]  Showing recent visits within past 90 days and meeting all other requirements Today's Visits Date Type Provider Dept  03/22/21 Office Visit Milinda Pointer, MD [facility]  Showing today's visits and meeting all other requirements Future Appointments Date Type Provider Dept  04/06/21 Appointment Milinda Pointer, MD [facility]  Showing future appointments within next 90 days and meeting all other requirements  I discussed the assessment and treatment plan with the patient. The patient was provided an opportunity to ask questions and all were answered. The patient agreed with the plan and demonstrated an understanding of the instructions.  Patient advised to call back or seek an in-person evaluation if the symptoms or condition worsens.  Duration of encounter: 30 minutes.  Note by: Gaspar Cola, MD Date: 03/22/2021; Time: 10:12 AM

## 2021-03-22 ENCOUNTER — Encounter: Payer: Self-pay | Admitting: Pain Medicine

## 2021-03-22 ENCOUNTER — Other Ambulatory Visit: Payer: Self-pay

## 2021-03-22 ENCOUNTER — Ambulatory Visit: Payer: Medicare Other | Attending: Pain Medicine | Admitting: Pain Medicine

## 2021-03-22 VITALS — BP 120/79 | HR 63 | Temp 97.9°F | Resp 16 | Ht 76.0 in | Wt 270.0 lb

## 2021-03-22 DIAGNOSIS — M79605 Pain in left leg: Secondary | ICD-10-CM | POA: Insufficient documentation

## 2021-03-22 DIAGNOSIS — M47812 Spondylosis without myelopathy or radiculopathy, cervical region: Secondary | ICD-10-CM | POA: Diagnosis present

## 2021-03-22 DIAGNOSIS — F1120 Opioid dependence, uncomplicated: Secondary | ICD-10-CM | POA: Diagnosis present

## 2021-03-22 DIAGNOSIS — M5030 Other cervical disc degeneration, unspecified cervical region: Secondary | ICD-10-CM | POA: Diagnosis present

## 2021-03-22 DIAGNOSIS — M25562 Pain in left knee: Secondary | ICD-10-CM | POA: Diagnosis present

## 2021-03-22 DIAGNOSIS — G8929 Other chronic pain: Secondary | ICD-10-CM | POA: Diagnosis present

## 2021-03-22 DIAGNOSIS — Z79899 Other long term (current) drug therapy: Secondary | ICD-10-CM | POA: Diagnosis present

## 2021-03-22 DIAGNOSIS — M5481 Occipital neuralgia: Secondary | ICD-10-CM | POA: Diagnosis present

## 2021-03-22 DIAGNOSIS — M545 Low back pain, unspecified: Secondary | ICD-10-CM | POA: Insufficient documentation

## 2021-03-22 DIAGNOSIS — Z79891 Long term (current) use of opiate analgesic: Secondary | ICD-10-CM | POA: Insufficient documentation

## 2021-03-22 DIAGNOSIS — M542 Cervicalgia: Secondary | ICD-10-CM | POA: Insufficient documentation

## 2021-03-22 DIAGNOSIS — G894 Chronic pain syndrome: Secondary | ICD-10-CM | POA: Diagnosis present

## 2021-03-22 DIAGNOSIS — M79604 Pain in right leg: Secondary | ICD-10-CM | POA: Insufficient documentation

## 2021-03-22 DIAGNOSIS — M25561 Pain in right knee: Secondary | ICD-10-CM | POA: Insufficient documentation

## 2021-03-22 DIAGNOSIS — M5412 Radiculopathy, cervical region: Secondary | ICD-10-CM | POA: Insufficient documentation

## 2021-03-22 DIAGNOSIS — M961 Postlaminectomy syndrome, not elsewhere classified: Secondary | ICD-10-CM | POA: Diagnosis present

## 2021-03-22 MED ORDER — OXYCODONE HCL 20 MG PO TABS: 20.0000 mg | ORAL_TABLET | Freq: Three times a day (TID) | ORAL | 0 refills | Status: DC | PRN

## 2021-03-22 NOTE — Patient Instructions (Addendum)
____________________________________________________________________________________________  Drug Holidays (Slow)  What is a "Drug Holiday"? Drug Holiday: is the name given to the period of time during which a patient stops taking a medication(s) for the purpose of eliminating tolerance to the drug.  Benefits . Improved effectiveness of opioids. . Decreased opioid dose needed to achieve benefits. . Improved pain with lesser dose.  What is tolerance? Tolerance: is the progressive decreased in effectiveness of a drug due to its repetitive use. With repetitive use, the body gets use to the medication and as a consequence, it loses its effectiveness. This is a common problem seen with opioid pain medications. As a result, a larger dose of the drug is needed to achieve the same effect that used to be obtained with a smaller dose.  How long should a "Drug Holiday" last? You should stay off of the pain medicine for at least 14 consecutive days. (2 weeks)  Should I stop the medicine "cold [place]"? No. You should always coordinate with your Pain Specialist so that he/she can provide you with the correct medication dose to make the transition as smoothly as possible.  How do I stop the medicine? Slowly. You will be instructed to decrease the daily amount of pills that you take by one (1) pill every seven (7) days. This is called a "slow downward taper" of your dose. For example: if you normally take four (4) pills per day, you will be asked to drop this dose to three (3) pills per day for seven (7) days, then to two (2) pills per day for seven (7) days, then to one (1) per day for seven (7) days, and at the end of those last seven (7) days, this is when the "Drug Holiday" would start.   Will I have withdrawals? By doing a "slow downward taper" like this one, it is unlikely that you will experience any significant withdrawal symptoms. Typically, what triggers withdrawals is the sudden stop of a high  dose opioid therapy. Withdrawals can usually be avoided by slowly decreasing the dose over a prolonged period of time. If you do not follow these instructions and decide to stop your medication abruptly, withdrawals may be possible.  What are withdrawals? Withdrawals: refers to the wide range of symptoms that occur after stopping or dramatically reducing opiate drugs after heavy and prolonged use. Withdrawal symptoms do not occur to patients that use low dose opioids, or those who take the medication sporadically. Contrary to benzodiazepine (example: Valium, Xanax, etc.) or alcohol withdrawals ("Delirium Tremens"), opioid withdrawals are not lethal. Withdrawals are the physical manifestation of the body getting rid of the excess receptors.  Expected Symptoms Early symptoms of withdrawal may include: . Agitation . Anxiety . Muscle aches . Increased tearing . Insomnia . Runny nose . Sweating . Yawning  Late symptoms of withdrawal may include: . Abdominal cramping . Diarrhea . Dilated pupils . Goose bumps . Nausea . Vomiting  Will I experience withdrawals? Due to the slow nature of the taper, it is very unlikely that you will experience any.  What is a slow taper? Taper: refers to the gradual decrease in dose.  (Last update: 05/25/2020) ____________________________________________________________________________________________    ____________________________________________________________________________________________  Medication Recommendations and Reminders  Applies to: All patients receiving prescriptions (written and/or electronic).  Medication Rules & Regulations: These rules and regulations exist for your safety and that of others. They are not flexible and neither are we. Dismissing or ignoring them will be considered "non-compliance" with medication therapy, resulting in complete  and irreversible termination of such therapy. (See document titled "Medication Rules" for  more details.) In all conscience, because of safety reasons, we cannot continue providing a therapy where the patient does not follow instructions.  Pharmacy of record:   Definition: This is the pharmacy where your electronic prescriptions will be sent.   We do not endorse any particular pharmacy, however, we have experienced problems with [facility] not securing enough medication supply for the community.  We do not restrict you in your choice of pharmacy. However, once we write for your prescriptions, we will NOT be re-sending more prescriptions to fix restricted supply problems created by your pharmacy, or your insurance.   The pharmacy listed in the electronic medical record should be the one where you want electronic prescriptions to be sent.  If you choose to change pharmacy, simply notify our nursing staff.  Recommendations:  Keep all of your pain medications in a safe place, under lock and key, even if you live alone. We will NOT replace lost, stolen, or damaged medication.  After you fill your prescription, take 1 week's worth of pills and put them away in a safe place. You should keep a separate, properly labeled bottle for this purpose. The remainder should be kept in the original bottle. Use this as your primary supply, until it runs out. Once it's gone, then you know that you have 1 week's worth of medicine, and it is time to come in for a prescription refill. If you do this correctly, it is unlikely that you will ever run out of medicine.  To make sure that the above recommendation works, it is very important that you make sure your medication refill appointments are scheduled at least 1 week before you run out of medicine. To do this in an effective manner, make sure that you do not leave the office without scheduling your next medication management appointment. Always ask the nursing staff to show you in your prescription , when your medication will be running out. Then arrange for  the receptionist to get you a return appointment, at least 7 days before you run out of medicine. Do not wait until you have 1 or 2 pills left, to come in. This is very poor planning and does not take into consideration that we may need to cancel appointments due to bad weather, sickness, or emergencies affecting our staff.  DO NOT ACCEPT A "Partial Fill": If for any reason your pharmacy does not have enough pills/tablets to completely fill or refill your prescription, do not allow for a "partial fill". The law allows the pharmacy to complete that prescription within 72 hours, without requiring a new prescription. If they do not fill the rest of your prescription within those 72 hours, you will need a separate prescription to fill the remaining amount, which we will NOT provide. If the reason for the partial fill is your insurance, you will need to talk to the pharmacist about payment alternatives for the remaining tablets, but again, DO NOT ACCEPT A PARTIAL FILL, unless you can trust your pharmacist to obtain the remainder of the pills within 72 hours.  Prescription refills and/or changes in medication(s):   Prescription refills, and/or changes in dose or medication, will be conducted only during scheduled medication management appointments. (Applies to both, written and electronic prescriptions.)  No refills on procedure days. No medication will be changed or started on procedure days. No changes, adjustments, and/or refills will be conducted on a procedure day. Doing so   will interfere with the diagnostic portion of the procedure.  No phone refills. No medications will be "called into the pharmacy".  No Fax refills.  No weekend refills.  No Holliday refills.  No after hours refills.  Remember:  Business hours are:  Monday to Thursday 8:00 AM to 4:00 PM Provider's Schedule: Kaisen Ackers, MD - Appointments are:  Medication management: Monday and Wednesday 8:00 AM to 4:00 PM Procedure  day: Tuesday and Thursday 7:30 AM to 4:00 PM Bilal Lateef, MD - Appointments are:  Medication management: Tuesday and Thursday 8:00 AM to 4:00 PM Procedure day: Monday and Wednesday 7:30 AM to 4:00 PM (Last update: 05/25/2020) ____________________________________________________________________________________________   ____________________________________________________________________________________________  CBD (cannabidiol) WARNING  Applicable to: All individuals currently taking or considering taking CBD (cannabidiol) and, more important, all patients taking opioid analgesic controlled substances (pain medication). (Example: oxycodone; oxymorphone; hydrocodone; hydromorphone; morphine; methadone; tramadol; tapentadol; fentanyl; buprenorphine; butorphanol; dextromethorphan; meperidine; codeine; etc.)  Legal status: CBD remains a Schedule I drug prohibited for any use. CBD is illegal with one exception. In the [place], CBD has a limited Food and Drug Administration (FDA) approval for the treatment of two specific types of epilepsy disorders. Only one CBD product has been approved by the FDA for this purpose: "Epidiolex". FDA is aware that some companies are marketing products containing cannabis and cannabis-derived compounds in ways that violate the Federal Food, Drug and Cosmetic Act (FD&C Act) and that may put the health and safety of consumers at risk. The FDA, a Federal agency, has not enforced the CBD status since 2018.   Legality: Some manufacturers ship CBD products nationally, which is illegal. Often such products are sold online and are therefore available throughout the country. CBD is openly sold in head shops and health food stores in some states where such sales have not been explicitly legalized. Selling unapproved products with unsubstantiated therapeutic claims is not only a violation of the law, but also can put patients at risk, as these products have not been proven to  be safe or effective. Federal illegality makes it difficult to conduct research on CBD.  Reference: "FDA Regulation of Cannabis and Cannabis-Derived Products, Including Cannabidiol (CBD)" - https://www.fda.gov/news-events/public-health-focus/fda-regulation-cannabis-and-cannabis-derived-products-including-cannabidiol-cbd  Warning: CBD is not FDA approved and has not undergo the same manufacturing controls as prescription drugs.  This means that the purity and safety of available CBD may be questionable. Most of the time, despite manufacturer's claims, it is contaminated with THC (delta-9-tetrahydrocannabinol - the chemical in marijuana responsible for the "HIGH").  When this is the case, the THC contaminant will trigger a positive urine drug screen (UDS) test for Marijuana (carboxy-THC). Because a positive UDS for any illicit substance is a violation of our medication agreement, your opioid analgesics (pain medicine) may be permanently discontinued.  MORE ABOUT CBD  General Information: CBD  is a derivative of the Marijuana (cannabis sativa) plant discovered in 1940. It is one of the 113 identified substances found in Marijuana. It accounts for up to 40% of the plant's extract. As of 2018, preliminary clinical studies on CBD included research for the treatment of anxiety, movement disorders, and pain. CBD is available and consumed in multiple forms, including inhalation of smoke or vapor, as an aerosol spray, and by mouth. It may be supplied as an oil containing CBD, capsules, dried cannabis, or as a liquid solution. CBD is thought not to be as psychoactive as THC (delta-9-tetrahydrocannabinol - the chemical in marijuana responsible for the "HIGH"). Studies suggest that CBD may interact   with different biological target receptors in the body, including cannabinoid and other neurotransmitter receptors. As of 2018 the mechanism of action for its biological effects has not been determined.  Side-effects   Adverse reactions: Dry mouth, diarrhea, decreased appetite, fatigue, drowsiness, malaise, weakness, sleep disturbances, and others.  Drug interactions: CBC may interact with other medications such as blood-thinners. (Last update: 06/11/2020) ____________________________________________________________________________________________   ____________________________________________________________________________________________  Medication Rules  Purpose: To inform patients, and their family members, of our rules and regulations.  Applies to: All patients receiving prescriptions (written or electronic).  Pharmacy of record: Pharmacy where electronic prescriptions will be sent. If written prescriptions are taken to a different pharmacy, please inform the nursing staff. The pharmacy listed in the electronic medical record should be the one where you would like electronic prescriptions to be sent.  Electronic prescriptions: In compliance with the [place]reng[place] Opioid Misuse Prevention (STOP) Act of 2017 (Session Law 2017-74/H243), effective November 05, 2018, all controlled substances must be electronically prescribed. Calling prescriptions to the pharmacy will cease to exist.  Prescription refills: Only during scheduled appointments. Applies to all prescriptions.  NOTE: The following applies primarily to controlled substances (Opioid* Pain Medications).   Type of encounter (visit): For patients receiving controlled substances, face-to-face visits are required. (Not an option or up to the patient.)  Patient's responsibilities: 1. Pain Pills: Bring all pain pills to every appointment (except for procedure appointments). 2. Pill Bottles: Bring pills in original pharmacy bottle. Always bring the newest bottle. Bring bottle, even if empty. 3. Medication refills: You are responsible for knowing and keeping track of what medications you take and those you need refilled. The day before  your appointment: write a list of all prescriptions that need to be refilled. The day of the appointment: give the list to the admitting nurse. Prescriptions will be written only during appointments. No prescriptions will be written on procedure days. If you forget a medication: it will not be "Called in", "Faxed", or "electronically sent". You will need to get another appointment to get these prescribed. No early refills. Do not call asking to have your prescription filled early. 4. Prescription Accuracy: You are responsible for carefully inspecting your prescriptions before leaving our office. Have the discharge nurse carefully go over each prescription with you, before taking them home. Make sure that your name is accurately spelled, that your address is correct. Check the name and dose of your medication to make sure it is accurate. Check the number of pills, and the written instructions to make sure they are clear and accurate. Make sure that you are given enough medication to last until your next medication refill appointment. 5. Taking Medication: Take medication as prescribed. When it comes to controlled substances, taking less pills or less frequently than prescribed is permitted and encouraged. Never take more pills than instructed. Never take medication more frequently than prescribed.  6. Inform other Doctors: Always inform, all of your healthcare providers, of all the medications you take. 7. Pain Medication from other Providers: You are not allowed to accept any additional pain medication from any other Doctor or Healthcare provider. There are two exceptions to this rule. (see below) In the event that you require additional pain medication, you are responsible for notifying us, as stated below. 8. Cough Medicine: Often these contain an opioid, such as codeine or hydrocodone. Never accept or take cough medicine containing these opioids if you are already taking an opioid* medication. The  combination may cause respiratory failure and   death. 9. Medication Agreement: You are responsible for carefully reading and following our Medication Agreement. This must be signed before receiving any prescriptions from our practice. Safely store a copy of your signed Agreement. Violations to the Agreement will result in no further prescriptions. (Additional copies of our Medication Agreement are available upon request.) 10. Laws, Rules, & Regulations: All patients are expected to follow all Federal and [employer], [employer], Rules, [employer]. Ignorance of the Laws does not constitute a valid excuse.  11. Illegal drugs and Controlled Substances: The use of illegal substances (including, but not limited to marijuana and its derivatives) and/or the illegal use of any controlled substances is strictly prohibited. Violation of this rule may result in the immediate and permanent discontinuation of any and all prescriptions being written by our practice. The use of any illegal substances is prohibited. 12. Adopted CDC guidelines & recommendations: Target dosing levels will be at or below 60 MME/day. Use of benzodiazepines** is not recommended.  Exceptions: There are only two exceptions to the rule of not receiving pain medications from other Healthcare Providers. 1. Exception #1 (Emergencies): In the event of an emergency (i.e.: accident requiring emergency care), you are allowed to receive additional pain medication. However, you are responsible for: As soon as you are able, call our office [phone], at any time of the day or night, and leave a message stating your name, the date and nature of the emergency, and the name and dose of the medication prescribed. In the event that your call is answered by a member of our staff, make sure to document and save the date, time, and the name of the person that took your information.  2. Exception #2 (Planned Surgery): In the event that you are scheduled by  another doctor or dentist to have any type of surgery or procedure, you are allowed (for a period no longer than 30 days), to receive additional pain medication, for the acute post-op pain. However, in this case, you are responsible for picking up a copy of our "Post-op Pain Management for Surgeons" handout, and giving it to your surgeon or dentist. This document is available at our office, and does not require an appointment to obtain it. Simply go to our office during business hours (Monday-Thursday from 8:00 AM to 4:00 PM) (Friday 8:00 AM to 12:00 Noon) or if you have a scheduled appointment with [place], prior to your surgery, and ask for it by name. In addition, you are responsible for: calling our office [phone], at any time of the day or night, and leaving a message stating your name, name of your surgeon, type of surgery, and date of procedure or surgery. Failure to comply with your responsibilities may result in termination of therapy involving the controlled substances.  *Opioid medications include: morphine, codeine, oxycodone, oxymorphone, hydrocodone, hydromorphone, meperidine, tramadol, tapentadol, buprenorphine, fentanyl, methadone. **Benzodiazepine medications include: diazepam (Valium), alprazolam (Xanax), clonazepam (Klonopine), lorazepam (Ativan), clorazepate (Tranxene), chlordiazepoxide (Librium), estazolam (Prosom), oxazepam (Serax), temazepam (Restoril), triazolam (Halcion) (Last updated: 10/03/2020) ____________________________________________________________________________________________   ____________________________________________________________________________________________  Preparing for Procedure with Sedation  Procedure appointments are limited to planned procedures: . No Prescription Refills. . No disability issues will be discussed. . No medication changes will be discussed.  Instructions: . Oral Intake: Do not eat or drink anything for at least 8 hours prior  to your procedure. (Exception: Blood Pressure Medication. See below.) . Transportation: Unless otherwise stated by your physician, you may drive yourself after the procedure. . Blood Pressure  Medicine: Do not forget to take your blood pressure medicine with a sip of water the morning of the procedure. If your Diastolic (lower reading)is above 100 mmHg, elective cases will be cancelled/rescheduled. . Blood thinners: These will need to be stopped for procedures. Notify our staff if you are taking any blood thinners. Depending on which one you take, there will be specific instructions on how and when to stop it. . Diabetics on insulin: Notify the staff so that you can be scheduled 1st case in the morning. If your diabetes requires high dose insulin, take only  of your normal insulin dose the morning of the procedure and notify the staff that you have done so. . Preventing infections: Shower with an antibacterial soap the morning of your procedure. . Build-up your immune system: Take 1000 mg of Vitamin C with every meal (3 times a day) the day prior to your procedure. Marland Kitchen Antibiotics: Inform the staff if you have a condition or reason that requires you to take antibiotics before dental procedures. . Pregnancy: If you are pregnant, call and cancel the procedure. . Sickness: If you have a cold, fever, or any active infections, call and cancel the procedure. . Arrival: You must be in the facility at least 30 minutes prior to your scheduled procedure. . Children: Do not bring children with you. . Dress appropriately: Bring dark clothing that you would not mind if they get stained. . Valuables: Do not bring any jewelry or valuables.  Reasons to call and reschedule or cancel your procedure: (Following these recommendations will minimize the risk of a serious complication.) . Surgeries: Avoid having procedures within 2 weeks of any surgery. (Avoid for 2 weeks before or after any surgery). . Flu Shots: Avoid  having procedures within 2 weeks of a flu shots or . (Avoid for 2 weeks before or after immunizations). . Barium: Avoid having a procedure within 7-10 days after having had a radiological study involving the use of radiological contrast. (Myelograms, Barium swallow or enema study). . Heart attacks: Avoid any elective procedures or surgeries for the initial 6 months after a "Myocardial Infarction" (Heart Attack). . Blood thinners: It is imperative that you stop these medications before procedures. Let us know if you if you take any blood thinner.  . Infection: Avoid procedures during or within two weeks of an infection (including chest colds or gastrointestinal problems). Symptoms associated with infections include: Localized redness, fever, chills, night sweats or profuse sweating, burning sensation when voiding, cough, congestion, stuffiness, runny nose, sore throat, diarrhea, nausea, vomiting, cold or Flu symptoms, recent or current infections. It is specially important if the infection is over the area that we intend to treat. Marland Kitchen Heart and lung problems: Symptoms that may suggest an active cardiopulmonary problem include: cough, chest pain, breathing difficulties or shortness of breath, dizziness, ankle swelling, uncontrolled high or unusually low blood pressure, and/or palpitations. If you are experiencing any of these symptoms, cancel your procedure and contact your primary care physician for an evaluation.  Remember:  Regular Business hours are:  Monday to Thursday 8:00 AM to 4:00 PM  Provider's Schedule: Milinda Pointer, MD:  Procedure days: Tuesday and Thursday 7:30 AM to 4:00 PM  Gillis Santa, MD:  Procedure days: Monday and Wednesday 7:30 AM to 4:00 PM ____________________________________________________________________________________________   ____________________________________________________________________________________________  General Risks and Possible  Complications  Patient Responsibilities: It is important that you read this as it is part of your informed consent. It is our duty to inform you  of the risks and possible complications associated with treatments offered to you. It is your responsibility as a patient to read this and to ask questions about anything that is not clear or that you believe was not covered in this document.  Patient's Rights: You have the right to refuse treatment. You also have the right to change your mind, even after initially having agreed to have the treatment done. However, under this last option, if you wait until the last second to change your mind, you may be charged for the materials used up to that point.  Introduction: Medicine is not an Chief Strategy Officer. Everything in Medicine, including the lack of treatment(s), carries the potential for danger, harm, or loss (which is by definition: Risk). In Medicine, a complication is a secondary problem, condition, or disease that can aggravate an already existing one. All treatments carry the risk of possible complications. The fact that a side effects or complications occurs, does not imply that the treatment was conducted incorrectly. It must be clearly understood that these can happen even when everything is done following the highest safety standards.  No treatment: You can choose not to proceed with the proposed treatment alternative. The "PRO(s)" would include: avoiding the risk of complications associated with the therapy. The "CON(s)" would include: not getting any of the treatment benefits. These benefits fall under one of three categories: diagnostic; therapeutic; and/or palliative. Diagnostic benefits include: getting information which can ultimately lead to improvement of the disease or symptom(s). Therapeutic benefits are those associated with the successful treatment of the disease. Finally, palliative benefits are those related to the decrease of the primary  symptoms, without necessarily curing the condition (example: decreasing the pain from a flare-up of a chronic condition, such as incurable terminal cancer).  General Risks and Complications: These are associated to most interventional treatments. They can occur alone, or in combination. They fall under one of the following six (6) categories: no benefit or worsening of symptoms; bleeding; infection; nerve damage; allergic reactions; and/or death. 1. No benefits or worsening of symptoms: In Medicine there are no guarantees, only probabilities. No healthcare provider can ever guarantee that a medical treatment will work, they can only state the probability that it may. Furthermore, there is always the possibility that the condition may worsen, either directly, or indirectly, as a consequence of the treatment. 2. Bleeding: This is more common if the patient is taking a blood thinner, either prescription or over the counter (example: Goody Powders, Fish oil, Aspirin, Garlic, etc.), or if suffering a condition associated with impaired coagulation (example: Hemophilia, cirrhosis of the liver, low platelet counts, etc.). However, even if you do not have one on these, it can still happen. If you have any of these conditions, or take one of these drugs, make sure to notify your treating physician. 3. Infection: This is more common in patients with a compromised immune system, either due to disease (example: diabetes, cancer, human immunodeficiency virus [HIV], etc.), or due to medications or treatments (example: therapies used to treat cancer and rheumatological diseases). However, even if you do not have one on these, it can still happen. If you have any of these conditions, or take one of these drugs, make sure to notify your treating physician. 4. Nerve Damage: This is more common when the treatment is an invasive one, but it can also happen with the use of medications, such as those used in the treatment of cancer.  The damage can occur to small secondary  nerves, or to large primary ones, such as those in the spinal cord and brain. This damage may be temporary or permanent and it may lead to impairments that can range from temporary numbness to permanent paralysis and/or brain death. 5. Allergic Reactions: Any time a substance or material comes in contact with our body, there is the possibility of an allergic reaction. These can range from a mild skin rash (contact dermatitis) to a severe systemic reaction (anaphylactic reaction), which can result in death. 6. Death: In general, any medical intervention can result in death, most of the time due to an unforeseen complication. ____________________________________________________________________________________________  Pain Management Discharge Instructions  General Discharge Instructions :  If you need to reach your doctor call: Monday-Friday 8:00 am - 4:00 pm at [phone] or toll free [phone].  After clinic hours [phone] to have operator reach doctor.  Bring all of your medication bottles to all your appointments in the pain clinic.  To cancel or reschedule your appointment with Pain Management please remember to call 24 hours in advance to avoid a fee.  Refer to the educational materials which you have been given on: General Risks, I had my Procedure. Discharge Instructions, Post Sedation.  Post Procedure Instructions:  The drugs you were given will stay in your system until tomorrow, so for the next 24 hours you should not drive, make any legal decisions or drink any alcoholic beverages.  You may eat anything you prefer, but it is better to start with liquids then soups and crackers, and gradually work up to solid foods.  Please notify your doctor immediately if you have any unusual bleeding, trouble breathing or pain that is not related to your normal pain.  Depending on the type of procedure that was done, some parts of your body may  feel week and/or numb.  This usually clears up by tonight or the next day.  Walk with the use of an assistive device or accompanied by an adult for the 24 hours.  You may use ice on the affected area for the first 24 hours.  Put ice in a Ziploc bag and cover with a towel and place against area 15 minutes on 15 minutes off.  You may switch to heat after 24 hours.Occipital Nerve Block Patient Information  Description: The occipital nerves originate in the cervical (neck) spinal cord and travel upward through muscle and tissue to supply sensation to the back of the head and top of the scalp.  In addition, the nerves control some of the muscles of the scalp.  Occipital neuralgia is an irritation of these nerves which can cause headaches, numbness of the scalp, and neck discomfort.     The occipital nerve block will interrupt nerve transmission through these nerves and can relieve pain and spasm.  The block consists of insertion of a small needle under the skin in the back of the head to deposit local anesthetic (numbing medicine) and/or steroids around the nerve.  The entire block usually lasts less than 5 minutes.  Conditions which may be treated by occipital blocks:   Muscular pain and spasm of the scalp  Nerve irritation, back of the head  Headaches  Upper neck pain  Preparation for the injection:  1. Do not eat any solid food or dairy products within 8 hours of your appointment. 2. You may drink clear liquids up to 3 hours before appointment.  Clear liquids include water, black coffee, juice or soda.  No milk or cream please. 3.  You may take your regular medication, including pain medications, with a sip of water before you appointment.  Diabetics should hold regular insulin (if taken separately) and take 1/2 normal NPH dose the morning of the procedure.  Carry some sugar containing items with you to your appointment. 4. A driver must accompany you and be prepared to drive you home after  your procedure. 5. Bring all your current medications with you. 6. An IV may be inserted and sedation may be given at the discretion of the physician. 7. A blood pressure cuff, EKG, and other monitors will often be applied during the procedure.  Some patients may need to have extra oxygen administered for a short period. 8. You will be asked to provide medical information, including your allergies and medications, prior to the procedure.  We must know immediately if you are taking blood thinners (like Coumadin/Warfarin) or if you are allergic to IV iodine contrast (dye).  We must know if you could possible be pregnant.  9. Do not wear a high collared shirt or turtleneck.  Tie long hair up in the back if possible.  Possible side-effects:   Bleeding from needle site  Infection (rare, may require surgery)  Nerve injury (rare)  Hair on back of neck can be tinged with iodine scrub (this will wash out)  Light-headedness (temporary)  Pain at injection site (several days)  Decreased blood pressure (rare, temporary)  Seizure (very rare)  Call if you experience:   Hives or difficulty breathing ( go to the emergency room)  Inflammation or drainage at the injection site(s)  Please note:  Although the local anesthetic injected can often make your painful muscles or headache feel good for several hours after the injection, the pain may return.  It takes 3-7 days for steroids to work.  You may not notice any pain relief for at least one week.  If effective, we will often do a series of injections spaced 3-6 weeks apart to maximally decrease your pain.  If you have any questions, please call [phone] [facility]

## 2021-03-22 NOTE — Progress Notes (Signed)
Nursing Pain Medication Assessment:  Safety precautions to be maintained throughout the outpatient stay will include: orient to surroundings, keep bed in low position, maintain call bell within reach at all times, provide assistance with transfer out of bed and ambulation.  Medication Inspection Compliance: Pill count conducted under aseptic conditions, in front of the patient. Neither the pills nor the bottle was removed from the patient's sight at any time. Once count was completed pills were immediately returned to the patient in their original bottle.  Medication: Oxycodone IR Pill/Patch Count: 10 of 90 pills remain Pill/Patch Appearance: Markings consistent with prescribed medication Bottle Appearance: Standard pharmacy container. Clearly labeled. Filled Date: 04 / 21 / 2022 Last Medication intake:  Today

## 2021-03-27 ENCOUNTER — Telehealth: Payer: Self-pay | Admitting: Pain Medicine

## 2021-03-27 NOTE — Telephone Encounter (Signed)
[facility] calling about patient's medication States the dates are 4 days past the date he needs refills? Please call Patient will be running out of meds ?

## 2021-03-27 NOTE — Telephone Encounter (Signed)
Dr Dossie Arbour, the pharmacy states that the patient will be 4 days out of medications in June. James [name] states you wrote the days wrong and they filled the 03-30-2021  Script on 03-25-2021 stating that the pharmacist made the decision to fill it then because he would be out if they didn't.  Please look at the dates on this.  It looks like he had one filled 2-19, 3-21,4-21 and now 5-21 except it said to fill on 5-26.  Please look at this. Thank you

## 2021-03-28 LAB — TOXASSURE SELECT 13 (MW), URINE

## 2021-03-29 ENCOUNTER — Encounter: Payer: Medicare Other | Admitting: Pain Medicine

## 2021-04-04 NOTE — Telephone Encounter (Signed)
Schedule him to come back sooner.

## 2021-04-06 ENCOUNTER — Ambulatory Visit: Payer: Medicare Other | Admitting: Pain Medicine

## 2021-04-06 ENCOUNTER — Other Ambulatory Visit: Payer: Self-pay | Admitting: Pain Medicine

## 2021-04-06 ENCOUNTER — Telehealth: Payer: Self-pay

## 2021-04-06 NOTE — Progress Notes (Signed)
Day we received a message from the patient indicating that he will be 4 days short on his pills.  On 11/23/2020's appointment, we sent 1 prescription to his pharmacy, which according to the PMP he had filled on 12/24/2020.  Because all of our prescriptions are for 30 days, that 1 should have lasted until 01/23/2021.  On 12/28/2020's appointment, we sent 3 prescriptions to his pharmacy and are pill count indicated that he still had 83 of 90 pills remaining on 12/28/2020 from the prescription that he had filled on 12/24/2020, 4 days earlier.  According to the PMP he had the first prescription filled on 01/23/2021; the second prescription filled on 02/23/2021; and the third prescription from 12/28/2020 was not filled. The second prescription failed on 02/23/2021 should have lasted until 03/25/2021. On 03/25/2021 he should have had the third prescription that was written on 12/28/2020 filled.  That last prescription should have lasted until 04/24/2021.  In summary, the 3 prescriptions should have been enough medication to last him until 04/24/2021.  On the 03/22/2021 appointment, we sent another 3 prescriptions to his pharmacy.  On that particular encounter our pill count indicated that he had 10 of 90 pills left from the second of 3 prescriptions written on 12/28/2020.  This means that based on the amount of pills that he had still left with him on 03/22/2021, he should have had enough to last until 03/25/2021, at which time he should have had his third prescription from 12/28/2020 filled.  The last one should have lasted until 04/24/2021.  If he then gets this first of the 3 prescriptions written on 03/22/2021 filled on 04/24/2021, then that when she did have lasted until 05/24/2021, the second 1 until 06/23/2021, on the third 1 should have lasted until 07/23/2021.  However, BMP records demonstrate that the patient's pharmacy did not fill the third prescription written on 12/28/2020, which should have been filled on 03/25/2021, and  instead they filled the first of the 3 prescriptions written on 03/22/2021, on 03/25/2021.  If we were to continue getting that set of 3 filled early, then he will be running out of medicine on 06/24/2021, instead of 07/23/2021 as he should have.  Interestingly, for some unknown reason if we look at the 03/22/2021 note he will noticed that he had an appointment with me schedule for today 04/06/2021.  However, in looking at today's schedule, he is not on it.  According to their after visit summary printed for him on 03/22/2021, he should have come in today, April 06, 2021 at 8 AM for a midline occipital nerve block under fluoroscopic guidance and IV sedation.  In addition, it would appear that he was scheduled for a medication management appointment on 06/26/2021 at 8:20 AM.  However, I have checked my schedule for that date and he is not on it.  Today I took a look at my schedule and he should be able to return on 06/21/2021 for his medication management appointment.

## 2021-04-07 NOTE — Telephone Encounter (Signed)
Thank you Kori for taking care of this.

## 2021-04-07 NOTE — Telephone Encounter (Signed)
Spoke with Mitch at the [facility].  He found the script written on 2-23 that was not filled.  This gives the patient 3 scripts at the pharmacy that can be filled to last him until August.

## 2021-06-18 NOTE — Progress Notes (Signed)
PROVIDER NOTE: Information contained herein reflects review and annotations entered in association with encounter. Interpretation of such information and data should be left to medically-trained personnel. Information provided to patient can be located elsewhere in the medical record under "Patient Instructions". Document created using STT-dictation technology, any transcriptional errors that may result from process are unintentional.    Patient: James [name]  Service Category: E/M  Provider: Gaspar Cola, MD  DOB: [birth year]  DOS: 06/19/2021  Specialty: Interventional Pain Management  MRN: [id number]  Setting: Ambulatory outpatient  PCP: Sheffield Slider, MD  Type: Established Patient    Referring Provider: Sheffield Slider, MD  Location: Office  Delivery: Face-to-face     HPI  Mr. ARMOUR VILLANUEVA, a 65 y.o. year old male, is here today because of his Chronic pain syndrome [G89.4]. Mr. Borghi primary complain today is Back Pain Last encounter: My last encounter with him was on 03/27/2021. Pertinent problems: Mr. Gadsden has Narrowing of intervertebral disc space; Chronic low back pain (1ry area of Pain) (Bilateral) (R>L); Lumbar spondylosis; Chronic neck pain (Right); Cervical spondylosis (C7 intravertebral body cyst); Chronic cervical radicular pain (Right); Chronic lumbar radicular pain (Bilateral) (L>R) (L5 Dermatome); Osteoarthritis; Chronic hip pain; Chronic knee pain (Bilateral) (R>L); Complex regional pain syndrome type II of upper extremity (Right); Chronic upper extremity pain (Right); 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); Epidural fibrosis; Chronic musculoskeletal pain; Neurogenic pain; Neuropathic pain; Chronic sacroiliac joint pain (Bilateral) (R>L); Psoriatic  arthritis ([place]); Chronic lower extremity pain (2ry area of Pain) (Bilateral) (L>R); Psoriasis with arthropathy ([place]); Numbness and tingling of both legs; Abnormal nerve conduction studies (06/20/16); Chronic pain syndrome; Perineal pain; Chest pain with high risk for cardiac etiology; Chronic upper extremity pain(R>L); Cervicalgia; DDD (degenerative disc disease), cervical; Closed fracture of lumbar vertebral body ([place]) (L2); Neurogenic bladder s/p L2 fracture (04/19/2020); Lumbosacral radiculopathy at L5 (Right); Compression fracture of L2 lumbar vertebra, sequela; Abnormal MRI, cervical spine (09/19/2018); Trigger finger, ring finger (Left); and Occipital neuralgia (midline) on their pertinent problem list. Pain Assessment: Severity of Chronic pain is reported as a 5 /10. Location: Back  /pain radiaties down his legs to his toes. Onset: More than a month ago. Quality: Nagging, Throbbing, Burning, Aching, Constant, Sharp. Timing: Constant. Modifying factor(s): meds and laying. Vitals:  height is 6' (1.829 m) and weight is 280 lb (127 kg). His temporal temperature is 97.1 F (36.2 C) (abnormal). His blood pressure is 138/87 and his pulse is 66. His respiration is 16 and oxygen saturation is 99%.   Reason for encounter: medication management.   The patient indicates doing well with the current medication regimen. No adverse reactions or side effects reported to the medications.   For some reason the patient was unable to get the cervical spine x-ray done.  For this reason we will reorder that today.  He is still having pain in the occipital region around the midline.  He wants to go ahead and proceed with a midline occipital nerve block.  We will make arrangements for this as soon as possible.  RTCB: 09/26/2021 Nonopioids transferred 08/31/2020: Zanaflex  Pharmacotherapy Assessment  Analgesic: Oxycodone IR 20 mg, 1 tab PO q 8 hrs (60 mg/day of oxycodone) MME/day: 120 mg/day.   Monitoring: [place] PMP: PDMP  reviewed during this encounter.       Pharmacotherapy: No side-effects or adverse  reactions reported. Compliance: No problems identified. Effectiveness: Clinically acceptable.  Chauncey Fischer, RN  06/19/2021  9:12 AM  Sign when Signing Visit Nursing Pain Medication Assessment:  Safety precautions to be maintained throughout the outpatient stay will include: orient to surroundings, keep bed in low position, maintain call bell within reach at all times, provide assistance with transfer out of bed and ambulation.  Medication Inspection Compliance: Pill count conducted under aseptic conditions, in front of the patient. Neither the pills nor the bottle was removed from the patient's sight at any time. Once count was completed pills were immediately returned to the patient in their original bottle.  Medication: Oxycodone IR Pill/Patch Count:  16 of 90 pills remain Pill/Patch Appearance: Markings consistent with prescribed medication Bottle Appearance: Standard pharmacy container. Clearly labeled. Filled Date: 7 / 21 / 2022 Last Medication intake:  Today Safety precautions to be maintained throughout the outpatient stay will include: orient to surroundings, keep bed in low position, maintain call bell within reach at all times, provide assistance with transfer out of bed and ambulation.      UDS:  Summary  Date Value Ref Range Status  03/22/2021 Note  Final    Comment:    ==================================================================== [employer] [facility] 13 (MW) ==================================================================== Test                             Result       Flag       Units  Drug Present and Declared for Prescription Verification   Oxycodone                      1249         EXPECTED   ng/mg creat   Oxymorphone                    832          EXPECTED   ng/mg creat   Noroxycodone                   5312         EXPECTED   ng/mg creat   Noroxymorphone                 285           EXPECTED   ng/mg creat    Sources of oxycodone are scheduled prescription medications.    Oxymorphone, noroxycodone, and noroxymorphone are expected    metabolites of oxycodone. Oxymorphone is also available as a    scheduled prescription medication.  ==================================================================== Test                      Result    Flag   Units      Ref Range   Creatinine              179              mg/dL      >=20 ==================================================================== Declared Medications:  The flagging and interpretation on this report are based on the  following declared medications.  Unexpected results may arise from  inaccuracies in the declared medications.   **Note: The testing scope of this panel includes these medications:   Oxycodone   **Note: The testing scope of this panel does not include the  following reported medications:   Albuterol (Ventolin HFA)  Loratadine (Claritin)  Metoprolol  Multivitamin  Naloxone (Narcan)  Tizanidine (Zanaflex)  Vitamin B ==================================================================== For clinical consultation, please call [phone]. ====================================================================      ROS  Constitutional: Denies any fever or chills Gastrointestinal: No reported hemesis, hematochezia, vomiting, or acute GI distress Musculoskeletal: Denies any acute onset joint swelling, redness, loss of ROM, or weakness Neurological: No reported episodes of acute onset apraxia, aphasia, dysarthria, agnosia, amnesia, paralysis, loss of coordination, or loss of consciousness  Medication Review  Ergocalciferol, Oxycodone HCl, albuterol, b complex vitamins, loratadine, metoprolol tartrate, multivitamin, naloxone, and tiZANidine  History Review  Allergy: Mr. Lona is allergic to penicillins, lactose, nsaids, talwin [pentazocine], codeine, procaine, and sulfa  antibiotics. Drug: Mr. Karel  reports no history of drug use. Alcohol:  reports no history of alcohol use. Tobacco:  reports that he has quit smoking. His smoking use included cigarettes. His smokeless tobacco use includes chew. Social: Mr. Salzwedel  reports that he has quit smoking. His smoking use included cigarettes. His smokeless tobacco use includes chew. He reports that he does not drink alcohol and does not use drugs. Medical:  has a past medical history of Allergy, Anemia, Arthritis, Asthma, Blood transfusion without reported diagnosis, Bronchitis, Bursitis, Cancer ([place]), Cataract, 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09/13/2015), Emphysema of lung ([place]), Gastritis, GERD (gastroesophageal reflux disease), Heart murmur, Hepatitis C, Hiatal hernia, Hypertension, Hypothyroidism, Kidney stone, Lupus ([place]), Obesity, Osteoporosis, Peripheral nerve disease, Psoriatic arthritis ([place]), Sleep apnea, Supraventricular tachycardia ([place]), Tendonitis, and Thyroid disease. Surgical: Mr. Gomillion  has a past surgical history that includes Spine surgery; Eye surgery; Fracture surgery (Right); Fracture surgery (Bilateral); Fracture surgery (Bilateral); Joint replacement (Right); Spinal cord stimulator implant; Spinal cord stimulator removal; Patella arthroplasty; Shoulder arthroscopy (Bilateral); Esophagogastroduodenoscopy (egd) with propofol (N/A, 01/20/2016); and Colonoscopy (2009). Family: family history includes Arthritis in his father and mother; COPD in his father; Cancer in his father and mother; Diabetes in his mother; Hematuria in his father and mother; Hypertension in his father and mother; Kidney disease in his mother; Kidney failure in his mother; Prostate cancer in his father; Stroke in his mother.  Laboratory Chemistry Profile   Renal Lab Results  Component Value Date   BUN 15 08/19/2019   CREATININE 1.47 (H) 08/19/2019   GFRAA 59 (L) 08/19/2019   GFRNONAA 51 (L)  08/19/2019    Hepatic Lab Results  Component Value Date   AST 25 08/19/2019   ALT 24 08/19/2019   ALBUMIN 4.2 08/19/2019   ALKPHOS 47 08/19/2019    Electrolytes Lab Results  Component Value Date   NA 138 08/19/2019   K 4.1 08/19/2019   CL 100 08/19/2019   CALCIUM 9.3 08/19/2019   MG 2.1 08/19/2019    Bone Lab Results  Component Value Date   VD25OH 26.19 (L) 08/19/2019   25OHVITD1 42 12/06/2016   25OHVITD2 <1.0 12/06/2016   25OHVITD3 42 12/06/2016   TESTOSTERONE 285 02/12/2018    Inflammation (CRP: Acute Phase) (ESR: Chronic Phase) Lab Results  Component Value Date   CRP <0.8 08/19/2019   ESRSEDRATE 1 08/19/2019         Note: Above Lab results reviewed.  Recent Imaging Review  MR CERVICAL SPINE WO CONTRAST CLINICAL DATA:  Neck pain worse on the right with radiculopathy. Numbness in the arms and hands with worsening symptoms in the past 6 months.  EXAM: MRI CERVICAL SPINE WITHOUT CONTRAST  TECHNIQUE: Multiplanar, multisequence MR imaging of the cervical spine was performed. No intravenous contrast was administered.  COMPARISON:  12/13/2016  FINDINGS:  Alignment: Chronic cervical spine straightening. Unchanged minimal anterolisthesis of C3 on C4.  Vertebrae: No fracture or suspicious osseous lesion. Chronic degenerative endplate changes at A3-5 with decreased degenerative endplate edema.  Cord: Normal signal.  Posterior Fossa, vertebral arteries, paraspinal tissues: Unremarkable.  Disc levels:  C2-3: Mild right and moderate to severe left facet arthrosis without stenosis, unchanged.  C3-4: Central disc protrusion, uncovertebral spurring, and severe right and moderate left facet arthrosis result in borderline spinal stenosis with slight ventral cord flattening and moderate right neural foraminal stenosis, unchanged.  C4-5: Minimal disc bulging, mild uncovertebral spurring, and moderate right facet arthrosis without stenosis, unchanged.  C5-6:  Moderate disc space narrowing. Disc bulging, uncovertebral spurring, and moderate right and mild left facet arthrosis result in mild spinal stenosis and moderate right greater than left neural foraminal stenosis, unchanged.  C6-7: Severe disc space narrowing. Broad-based posterior disc osteophyte complex results in mild spinal stenosis and mild-to-moderate right and borderline left neural foraminal stenosis, unchanged.  C7-T1: Minimal disc bulging and moderate to severe right and mild left facet arthrosis result in mild right neural foraminal stenosis without spinal stenosis, unchanged.  IMPRESSION: 1. Decreased degenerative endplate edema at T7-3. 2. Otherwise unchanged appearance of the cervical spine with up to mild spinal and moderate neural foraminal stenosis at multiple levels as above.  Electronically Signed   By: Logan Bores M.D.   On: 09/19/2018 10:30 Note: Reviewed        Physical Exam  General appearance: Well nourished, well developed, and well hydrated. In no apparent acute distress Mental status: Alert, oriented x 3 (person, place, & time)       Respiratory: No evidence of acute respiratory distress Eyes: PERLA Vitals: BP 138/87   Pulse 66   Temp (!) 97.1 F (36.2 C) (Temporal)   Resp 16   Ht 6' (1.829 m)   Wt 280 lb (127 kg)   SpO2 99%   BMI 37.97 kg/m  BMI: Estimated body mass index is 37.97 kg/m as calculated from the following:   Height as of this encounter: 6' (1.829 m).   Weight as of this encounter: 280 lb (127 kg). Ideal: Ideal body weight: 77.6 kg (171 lb 1.2 oz) Adjusted ideal body weight: 97.4 kg (214 lb 10.3 oz)  Assessment   Status Diagnosis  Controlled Controlled Controlled 1. Chronic pain syndrome   2. Chronic low back pain (1ry area of Pain) (Bilateral) (R>L)   3. Chronic lower extremity pain (2ry area of Pain) (Bilateral) (L>R)   4. Cervicalgia (Right)   5. Cervical spondylosis (C7 intravertebral body cyst)   6. Chronic cervical  radicular pain (Right)   7. Chronic knee pain (Bilateral) (R>L)   8. 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 Pharmacologic therapy   10. Chronic use of opiate for therapeutic purpose   11. Encounter for medication management   12. Occipital neuralgia, unspecified laterality      Updated Problems: No problems updated.  Plan of Care  Problem-specific:  No problem-specific Assessment & Plan notes found for this encounter.  Mr. SEVILLE DOWNS has a current medication list which includes the following long-term medication(s): albuterol, naloxone, [START ON 06/28/2021] oxycodone hcl, [START ON 07/28/2021] oxycodone hcl, [START ON 08/27/2021] oxycodone hcl, and tizanidine.  Pharmacotherapy (Medications Ordered): Meds ordered this encounter  Medications   Oxycodone HCl 20 MG TABS    Sig: Take 1 tablet (20 mg total) by mouth every 8 (eight) hours as needed. Must last  30 days    Dispense:  90 tablet    Refill:  0    Not a duplicate. Do NOT delete! Dispense 1 day early if closed on refill date. Avoid benzodiazepines within 8 hours of opioids. Do not send refill requests.   Oxycodone HCl 20 MG TABS    Sig: Take 1 tablet (20 mg total) by mouth every 8 (eight) hours as needed. Must last 30 days    Dispense:  90 tablet    Refill:  0    Not a duplicate. Do NOT delete! Dispense 1 day early if closed on refill date. Avoid benzodiazepines within 8 hours of opioids. Do not send refill requests.   Oxycodone HCl 20 MG TABS    Sig: Take 1 tablet (20 mg total) by mouth every 8 (eight) hours as needed. Must last 30 days    Dispense:  90 tablet    Refill:  0    Not a duplicate. Do NOT delete! Dispense 1 day early if closed on refill date. Avoid benzodiazepines within 8 hours of opioids. Do not send refill requests.   Orders:  Orders Placed This Encounter  Procedures   GREATER OCCIPITAL NERVE BLOCK    Standing Status:   Future    Standing Expiration Date:   09/19/2021     Scheduling Instructions:     Procedure: Occipital nerve block     Laterality: Midline     Sedation: Patient's choice.     Timeframe: ASAA    Order Specific Question:   Where will this procedure be performed?    Answer:   [facility]   DG Cervical Spine With Flex & Extend    Patient presents with axial pain with possible radicular component.  Please evaluate for any evidence of cervical spine instability. Describe the presence of any spondylolisthesis (Antero- or retrolisthesis). If present, provide displacement "Grade" and measurement in cm. Please describe presence and specific location (Level & Laterality) of any signs of  osteoarthritis, zygapophyseal (Facet) joints DJD (including decreased joint space and/or osteophytosis), DDD, Foraminal narrowing, as well as any sclerosis and/or cyst formation. Please comment on ROM. Patient presents with axial pain with possible radicular component. Please assist [place] in identifying specific level(s) and laterality of any additional findings such as: 1. Facet (Zygapophyseal) joint DJD (Hypertrophy, space narrowing, subchondral sclerosis, and/or osteophyte formation) 2. DDD and/or IVDD (Loss of disc height, desiccation, gas patterns, osteophytes, endplate sclerosis, or "Black disc disease") 3. Pars defects 4. Spondylolisthesis, spondylosis, and/or spondyloarthropathies (include Degree/Grade of displacement in mm) (stability) 5. Vertebral body Fractures (acute/chronic) (state percentage of collapse) 6. Demineralization (osteopenia/osteoporotic) 7. Bone pathology 8. Foraminal narrowing  9. Surgical changes    Standing Status:   Future    Standing Expiration Date:   07/20/2021    Scheduling Instructions:     Imaging must be done as soon as possible. Inform patient that order will expire within 30 days and I will not renew it.    Order Specific Question:   Reason for Exam (SYMPTOM  OR DIAGNOSIS REQUIRED)    Answer:   Cervicalgia    Order Specific  Question:   Preferred imaging location?    Answer:    [facility]    Order Specific Question:   Call Results- Best Contact Number?    Answer:   [phone] ([facility])    Order Specific Question:   Radiology Contrast Protocol - do NOT remove file path    Answer:   [email or web address]  Order Specific Question:   Release to patient    Answer:   Immediate   Informed Consent Details: Physician/Practitioner Attestation; Transcribe to consent form and obtain patient signature    Transcribe to consent form and obtain patient signature. Always confirm laterality of pain with Mr. Arguijo, before procedure.    Order Specific Question:   Physician/Practitioner attestation of informed consent for procedure/surgical case    Answer:   I, the physician/practitioner, attest that I have discussed with the patient the benefits, risks, side effects, alternatives, likelihood of achieving goals and potential problems during recovery for the procedure that I have provided informed consent.    Order Specific Question:   Procedure    Answer:   Occipital Nerve Block    Order Specific Question:   Physician/Practitioner performing the procedure    Answer:   Lataya Varnell A. Dossie Arbour, MD    Order Specific Question:   Indication/Reason    Answer:   Occipital Headache (Cervicogenic Headache) secondary to Occipital Neuralgia/Neuritis and/or upper cervical radiculitis.   Follow-up plan:   Return in about 3 months (around 09/26/2021) for (M,W) (F2F) (MM), in addition, (NO-sed) procedure: (ML) ONB.     Interventional therapies:  Considering:   Possible bilateral lumbar facet RFA  Possible bilateral cervical facet RFA    Palliative PRN treatment(s):   Palliative right CESI  Palliative right L4-5 LESI #2   Palliative bilateral lumbar facet blocks  Palliative left lumbar facet RFA #2 (Last done 07/18/17) Palliative bilateral cervical facet blocks     Recent  Visits Date Type Provider Dept  03/22/21 Office Visit Milinda Pointer, MD [facility]  Showing recent visits within past 90 days and meeting all other requirements Today's Visits Date Type Provider Dept  06/19/21 Office Visit Milinda Pointer, MD [facility]  Showing today's visits and meeting all other requirements Future Appointments No visits were found meeting these conditions. Showing future appointments within next 90 days and meeting all other requirements I discussed the assessment and treatment plan with the patient. The patient was provided an opportunity to ask questions and all were answered. The patient agreed with the plan and demonstrated an understanding of the instructions.  Patient advised to call back or seek an in-person evaluation if the symptoms or condition worsens.  Duration of encounter: 30 minutes.  Note by: Gaspar Cola, MD Date: 06/19/2021; Time: 9:56 AM

## 2021-06-19 ENCOUNTER — Ambulatory Visit: Payer: Medicare Other | Attending: Pain Medicine | Admitting: Pain Medicine

## 2021-06-19 ENCOUNTER — Other Ambulatory Visit: Payer: Self-pay

## 2021-06-19 ENCOUNTER — Encounter: Payer: Self-pay | Admitting: Pain Medicine

## 2021-06-19 VITALS — BP 138/87 | HR 66 | Temp 97.1°F | Resp 16 | Ht 72.0 in | Wt 280.0 lb

## 2021-06-19 DIAGNOSIS — Z79891 Long term (current) use of opiate analgesic: Secondary | ICD-10-CM

## 2021-06-19 DIAGNOSIS — M545 Low back pain, unspecified: Secondary | ICD-10-CM | POA: Diagnosis present

## 2021-06-19 DIAGNOSIS — M5412 Radiculopathy, cervical region: Secondary | ICD-10-CM | POA: Diagnosis present

## 2021-06-19 DIAGNOSIS — M79605 Pain in left leg: Secondary | ICD-10-CM | POA: Insufficient documentation

## 2021-06-19 DIAGNOSIS — M5481 Occipital neuralgia: Secondary | ICD-10-CM

## 2021-06-19 DIAGNOSIS — G894 Chronic pain syndrome: Secondary | ICD-10-CM | POA: Diagnosis present

## 2021-06-19 DIAGNOSIS — M542 Cervicalgia: Secondary | ICD-10-CM | POA: Diagnosis present

## 2021-06-19 DIAGNOSIS — M47812 Spondylosis without myelopathy or radiculopathy, cervical region: Secondary | ICD-10-CM | POA: Diagnosis present

## 2021-06-19 DIAGNOSIS — G8929 Other chronic pain: Secondary | ICD-10-CM

## 2021-06-19 DIAGNOSIS — M961 Postlaminectomy syndrome, not elsewhere classified: Secondary | ICD-10-CM | POA: Diagnosis present

## 2021-06-19 DIAGNOSIS — M79604 Pain in right leg: Secondary | ICD-10-CM | POA: Insufficient documentation

## 2021-06-19 DIAGNOSIS — Z79899 Other long term (current) drug therapy: Secondary | ICD-10-CM | POA: Diagnosis present

## 2021-06-19 DIAGNOSIS — M25561 Pain in right knee: Secondary | ICD-10-CM | POA: Insufficient documentation

## 2021-06-19 DIAGNOSIS — M25562 Pain in left knee: Secondary | ICD-10-CM | POA: Insufficient documentation

## 2021-06-19 MED ORDER — OXYCODONE HCL 20 MG PO TABS: 20.0000 mg | ORAL_TABLET | Freq: Three times a day (TID) | ORAL | 0 refills | Status: DC | PRN

## 2021-06-19 NOTE — Patient Instructions (Addendum)
______________________________________________________________________  Preparing for your procedure (without sedation)  Procedure appointments are limited to planned procedures: No Prescription Refills. No disability issues will be discussed. No medication changes will be discussed.  Instructions: Oral Intake: Do not eat or drink anything for at least 6 hours prior to your procedure. (Exception: Blood Pressure Medication. See below.) Transportation: Unless otherwise stated by your physician, you may drive yourself after the procedure. Blood Pressure Medicine: Do not forget to take your blood pressure medicine with a sip of water the morning of the procedure. If your Diastolic (lower reading)is above 100 mmHg, elective cases will be cancelled/rescheduled. Blood thinners: These will need to be stopped for procedures. Notify our staff if you are taking any blood thinners. Depending on which one you take, there will be specific instructions on how and when to stop it. Diabetics on insulin: Notify the staff so that you can be scheduled 1st case in the morning. If your diabetes requires high dose insulin, take only  of your normal insulin dose the morning of the procedure and notify the staff that you have done so. Preventing infections: Shower with an antibacterial soap the morning of your procedure.  Build-up your immune system: Take 1000 mg of Vitamin C with every meal (3 times a day) the day prior to your procedure. Antibiotics: Inform the staff if you have a condition or reason that requires you to take antibiotics before dental procedures. Pregnancy: If you are pregnant, call and cancel the procedure. Sickness: If you have a cold, fever, or any active infections, call and cancel the procedure. Arrival: You must be in the facility at least 30 minutes prior to your scheduled procedure. Children: Do not bring any children with you. Dress appropriately: Bring dark clothing that you would not mind  if they get stained. Valuables: Do not bring any jewelry or valuables.  Reasons to call and reschedule or cancel your procedure: (Following these recommendations will minimize the risk of a serious complication.) Surgeries: Avoid having procedures within 2 weeks of any surgery. (Avoid for 2 weeks before or after any surgery). Flu Shots: Avoid having procedures within 2 weeks of a flu shots or . (Avoid for 2 weeks before or after immunizations). Barium: Avoid having a procedure within 7-10 days after having had a radiological study involving the use of radiological contrast. (Myelograms, Barium swallow or enema study). Heart attacks: Avoid any elective procedures or surgeries for the initial 6 months after a "Myocardial Infarction" (Heart Attack). Blood thinners: It is imperative that you stop these medications before procedures. Let us know if you if you take any blood thinner.  Infection: Avoid procedures during or within two weeks of an infection (including chest colds or gastrointestinal problems). Symptoms associated with infections include: Localized redness, fever, chills, night sweats or profuse sweating, burning sensation when voiding, cough, congestion, stuffiness, runny nose, sore throat, diarrhea, nausea, vomiting, cold or Flu symptoms, recent or current infections. It is specially important if the infection is over the area that we intend to treat. Heart and lung problems: Symptoms that may suggest an active cardiopulmonary problem include: cough, chest pain, breathing difficulties or shortness of breath, dizziness, ankle swelling, uncontrolled high or unusually low blood pressure, and/or palpitations. If you are experiencing any of these symptoms, cancel your procedure and contact your primary care physician for an evaluation.  Remember:  Regular Business hours are:  Monday to Thursday 8:00 AM to 4:00 PM  Provider's Schedule: Adryel Wortmann, MD:  Procedure days: Tuesday and Thursday    7:30 AM to 4:00 PM  Bilal Lateef, MD:  Procedure days: Monday and Wednesday 7:30 AM to 4:00 PM ______________________________________________________________________  ____________________________________________________________________________________________  General Risks and Possible Complications  Patient Responsibilities: It is important that you read this as it is part of your informed consent. It is our duty to inform you of the risks and possible complications associated with treatments offered to you. It is your responsibility as a patient to read this and to ask questions about anything that is not clear or that you believe was not covered in this document.  Patient's Rights: You have the right to refuse treatment. You also have the right to change your mind, even after initially having agreed to have the treatment done. However, under this last option, if you wait until the last second to change your mind, you may be charged for the materials used up to that point.  Introduction: Medicine is not an exact science. Everything in Medicine, including the lack of treatment(s), carries the potential for danger, harm, or loss (which is by definition: Risk). In Medicine, a complication is a secondary problem, condition, or disease that can aggravate an already existing one. All treatments carry the risk of possible complications. The fact that a side effects or complications occurs, does not imply that the treatment was conducted incorrectly. It must be clearly understood that these can happen even when everything is done following the highest safety standards.  No treatment: You can choose not to proceed with the proposed treatment alternative. The "PRO(s)" would include: avoiding the risk of complications associated with the therapy. The "CON(s)" would include: not getting any of the treatment benefits. These benefits fall under one of three categories: diagnostic; therapeutic; and/or palliative.  Diagnostic benefits include: getting information which can ultimately lead to improvement of the disease or symptom(s). Therapeutic benefits are those associated with the successful treatment of the disease. Finally, palliative benefits are those related to the decrease of the primary symptoms, without necessarily curing the condition (example: decreasing the pain from a flare-up of a chronic condition, such as incurable terminal cancer).  General Risks and Complications: These are associated to most interventional treatments. They can occur alone, or in combination. They fall under one of the following six (6) categories: no benefit or worsening of symptoms; bleeding; infection; nerve damage; allergic reactions; and/or death. No benefits or worsening of symptoms: In Medicine there are no guarantees, only probabilities. No healthcare provider can ever guarantee that a medical treatment will work, they can only state the probability that it may. Furthermore, there is always the possibility that the condition may worsen, either directly, or indirectly, as a consequence of the treatment. Bleeding: This is more common if the patient is taking a blood thinner, either prescription or over the counter (example: Goody Powders, Fish oil, Aspirin, Garlic, etc.), or if suffering a condition associated with impaired coagulation (example: Hemophilia, cirrhosis of the liver, low platelet counts, etc.). However, even if you do not have one on these, it can still happen. If you have any of these conditions, or take one of these drugs, make sure to notify your treating physician. Infection: This is more common in patients with a compromised immune system, either due to disease (example: diabetes, cancer, human immunodeficiency virus [HIV], etc.), or due to medications or treatments (example: therapies used to treat cancer and rheumatological diseases). However, even if you do not have one on these, it can still happen. If you  have any of these conditions, or take   one of these drugs, make sure to notify your treating physician. Nerve Damage: This is more common when the treatment is an invasive one, but it can also happen with the use of medications, such as those used in the treatment of cancer. The damage can occur to small secondary nerves, or to large primary ones, such as those in the spinal cord and brain. This damage may be temporary or permanent and it may lead to impairments that can range from temporary numbness to permanent paralysis and/or brain death. Allergic Reactions: Any time a substance or material comes in contact with our body, there is the possibility of an allergic reaction. These can range from a mild skin rash (contact dermatitis) to a severe systemic reaction (anaphylactic reaction), which can result in death. Death: In general, any medical intervention can result in death, most of the time due to an unforeseen complication. ____________________________________________________________________________________________ ____________________________________________________________________________________________  Medication Rules  Purpose: To inform patients, and their family members, of our rules and regulations.  Applies to: All patients receiving prescriptions (written or electronic).  Pharmacy of record: Pharmacy where electronic prescriptions will be sent. If written prescriptions are taken to a different pharmacy, please inform the nursing staff. The pharmacy listed in the electronic medical record should be the one where you would like electronic prescriptions to be sent.  Electronic prescriptions: In compliance with the [place] (STOP) Act of 2017 (Session Lanny Cramp [phone]), effective November 05, 2018, all controlled substances must be electronically prescribed. Calling prescriptions to the pharmacy will cease to exist.  Prescription refills: Only during  scheduled appointments. Applies to all prescriptions.  NOTE: The following applies primarily to controlled substances (Opioid* Pain Medications).   Type of encounter (visit): For patients receiving controlled substances, face-to-face visits are required. (Not an option or up to the patient.)  Patient's responsibilities: Pain Pills: Bring all pain pills to every appointment (except for procedure appointments). Pill Bottles: Bring pills in original pharmacy bottle. Always bring the newest bottle. Bring bottle, even if empty. Medication refills: You are responsible for knowing and keeping track of what medications you take and those you need refilled. The day before your appointment: write a list of all prescriptions that need to be refilled. The day of the appointment: give the list to the admitting nurse. Prescriptions will be written only during appointments. No prescriptions will be written on procedure days. If you forget a medication: it will not be "Called in", "Faxed", or "electronically sent". You will need to get another appointment to get these prescribed. No early refills. Do not call asking to have your prescription filled early. Prescription Accuracy: You are responsible for carefully inspecting your prescriptions before leaving our office. Have the discharge nurse carefully go over each prescription with you, before taking them home. Make sure that your name is accurately spelled, that your address is correct. Check the name and dose of your medication to make sure it is accurate. Check the number of pills, and the written instructions to make sure they are clear and accurate. Make sure that you are given enough medication to last until your next medication refill appointment. Taking Medication: Take medication as prescribed. When it comes to controlled substances, taking less pills or less frequently than prescribed is permitted and encouraged. Never take more pills than  instructed. Never take medication more frequently than prescribed.  Inform other Doctors: Always inform, all of your healthcare providers, of all the medications you take. Pain Medication from other Providers:  You are not allowed to accept any additional pain medication from any other Doctor or Healthcare provider. There are two exceptions to this rule. (see below) In the event that you require additional pain medication, you are responsible for notifying us, as stated below. Cough Medicine: Often these contain an opioid, such as codeine or hydrocodone. Never accept or take cough medicine containing these opioids if you are already taking an opioid* medication. The combination may cause respiratory failure and death. Medication Agreement: You are responsible for carefully reading and following our Medication Agreement. This must be signed before receiving any prescriptions from our practice. Safely store a copy of your signed Agreement. Violations to the Agreement will result in no further prescriptions. (Additional copies of our Medication Agreement are available upon request.) Laws, Rules, & Regulations: All patients are expected to follow all Federal and [employer], [employer], Rules, [employer] [facility]. Ignorance of the Laws does not constitute a valid excuse.  Illegal drugs and Controlled Substances: The use of illegal substances (including, but not limited to marijuana and its derivatives) and/or the illegal use of any controlled substances is strictly prohibited. Violation of this rule may result in the immediate and permanent discontinuation of any and all prescriptions being written by our practice. The use of any illegal substances is prohibited. Adopted CDC guidelines & recommendations: Target dosing levels will be at or below 60 MME/day. Use of benzodiazepines** is not recommended.  Exceptions: There are only two exceptions to the rule of not receiving pain medications from other Healthcare  Providers. Exception #1 (Emergencies): In the event of an emergency (i.e.: accident requiring emergency care), you are allowed to receive additional pain medication. However, you are responsible for: As soon as you are able, call our office [phone], at any time of the day or night, and leave a message stating your name, the date and nature of the emergency, and the name and dose of the medication prescribed. In the event that your call is answered by a member of our staff, make sure to document and save the date, time, and the name of the person that took your information.  Exception #2 (Planned Surgery): In the event that you are scheduled by another doctor or dentist to have any type of surgery or procedure, you are allowed (for a period no longer than 30 days), to receive additional pain medication, for the acute post-op pain. However, in this case, you are responsible for picking up a copy of our "Post-op Pain Management for Surgeons" handout, and giving it to your surgeon or dentist. This document is available at our office, and does not require an appointment to obtain it. Simply go to our office during business hours (Monday-Thursday from 8:00 AM to 4:00 PM) (Friday 8:00 AM to 12:00 Noon) or if you have a scheduled appointment with [place], prior to your surgery, and ask for it by name. In addition, you are responsible for: calling our office [phone], at any time of the day or night, and leaving a message stating your name, name of your surgeon, type of surgery, and date of procedure or surgery. Failure to comply with your responsibilities may result in termination of therapy involving the controlled substances.  *Opioid medications include: morphine, codeine, oxycodone, oxymorphone, hydrocodone, hydromorphone, meperidine, tramadol, tapentadol, buprenorphine, fentanyl, methadone. **Benzodiazepine medications include: diazepam (Valium), alprazolam (Xanax), clonazepam (Klonopine), lorazepam  (Ativan), clorazepate (Tranxene), chlordiazepoxide (Librium), estazolam (Prosom), oxazepam (Serax), temazepam (Restoril), triazolam (Halcion) (Last updated: 10/03/2020) ____________________________________________________________________________________________  ____________________________________________________________________________________________  Medication Recommendations  and Reminders  Applies to: All patients receiving prescriptions (written and/or electronic).  Medication Rules & Regulations: These rules and regulations exist for your safety and that of others. They are not flexible and neither are we. Dismissing or ignoring them will be considered "non-compliance" with medication therapy, resulting in complete and irreversible termination of such therapy. (See document titled "Medication Rules" for more details.) In all conscience, because of safety reasons, we cannot continue providing a therapy where the patient does not follow instructions.  Pharmacy of record:  Definition: This is the pharmacy where your electronic prescriptions will be sent.  We do not endorse any particular pharmacy, however, we have experienced problems with [facility] not securing enough medication supply for the community. We do not restrict you in your choice of pharmacy. However, once we write for your prescriptions, we will NOT be re-sending more prescriptions to fix restricted supply problems created by your pharmacy, or your insurance.  The pharmacy listed in the electronic medical record should be the one where you want electronic prescriptions to be sent. If you choose to change pharmacy, simply notify our nursing staff.  Recommendations: Keep all of your pain medications in a safe place, under lock and key, even if you live alone. We will NOT replace lost, stolen, or damaged medication. After you fill your prescription, take 1 week's worth of pills and put them away in a safe place. You should keep a  separate, properly labeled bottle for this purpose. The remainder should be kept in the original bottle. Use this as your primary supply, until it runs out. Once it's gone, then you know that you have 1 week's worth of medicine, and it is time to come in for a prescription refill. If you do this correctly, it is unlikely that you will ever run out of medicine. To make sure that the above recommendation works, it is very important that you make sure your medication refill appointments are scheduled at least 1 week before you run out of medicine. To do this in an effective manner, make sure that you do not leave the office without scheduling your next medication management appointment. Always ask the nursing staff to show you in your prescription , when your medication will be running out. Then arrange for the receptionist to get you a return appointment, at least 7 days before you run out of medicine. Do not wait until you have 1 or 2 pills left, to come in. This is very poor planning and does not take into consideration that we may need to cancel appointments due to bad weather, sickness, or emergencies affecting our staff. DO NOT ACCEPT A "Partial Fill": If for any reason your pharmacy does not have enough pills/tablets to completely fill or refill your prescription, do not allow for a "partial fill". The law allows the pharmacy to complete that prescription within 72 hours, without requiring a new prescription. If they do not fill the rest of your prescription within those 72 hours, you will need a separate prescription to fill the remaining amount, which we will NOT provide. If the reason for the partial fill is your insurance, you will need to talk to the pharmacist about payment alternatives for the remaining tablets, but again, DO NOT ACCEPT A PARTIAL FILL, unless you can trust your pharmacist to obtain the remainder of the pills within 72 hours.  Prescription refills and/or changes in medication(s):   Prescription refills, and/or changes in dose or medication, will be conducted only during  scheduled medication management appointments. (Applies to both, written and electronic prescriptions.) No refills on procedure days. No medication will be changed or started on procedure days. No changes, adjustments, and/or refills will be conducted on a procedure day. Doing so will interfere with the diagnostic portion of the procedure. No phone refills. No medications will be "called into the pharmacy". No Fax refills. No weekend refills. No Holliday refills. No after hours refills.  Remember:  Business hours are:  Monday to Thursday 8:00 AM to 4:00 PM Provider's Schedule: Milinda Pointer, MD - Appointments are:  Medication management: Monday and Wednesday 8:00 AM to 4:00 PM Procedure day: Tuesday and Thursday 7:30 AM to 4:00 PM Gillis Santa, MD - Appointments are:  Medication management: Tuesday and Thursday 8:00 AM to 4:00 PM Procedure day: Monday and Wednesday 7:30 AM to 4:00 PM (Last update: 05/25/2020) ____________________________________________________________________________________________  ____________________________________________________________________________________________  CBD (cannabidiol) WARNING  Applicable to: All individuals currently taking or considering taking CBD (cannabidiol) and, more important, all patients taking opioid analgesic controlled substances (pain medication). (Example: oxycodone; oxymorphone; hydrocodone; hydromorphone; morphine; methadone; tramadol; tapentadol; fentanyl; buprenorphine; butorphanol; dextromethorphan; meperidine; codeine; etc.)  Legal status: CBD remains a Schedule I drug prohibited for any use. CBD is illegal with one exception. In the [place], CBD has a limited Transport planner (FDA) approval for the treatment of two specific types of epilepsy disorders. Only one CBD product has been approved by the FDA for this  purpose: "Epidiolex". FDA is aware that some companies are marketing products containing cannabis and cannabis-derived compounds in ways that violate the [employer], Drug and Cosmetic Act [facility] Act) and that may put the health and safety of consumers at risk. The FDA, a Federal agency, has not enforced the CBD status since 2018.   Legality: Some manufacturers ship CBD products nationally, which is illegal. Often such products are sold online and are therefore available throughout the country. CBD is openly sold in head shops and health food stores in some states where such sales have not been explicitly legalized. Selling unapproved products with unsubstantiated therapeutic claims is not only a violation of the law, but also can put patients at risk, as these products have not been proven to be safe or effective. Federal illegality makes it difficult to conduct research on CBD.  Reference: "FDA Regulation of Cannabis and Cannabis-Derived Products, Including Cannabidiol (CBD)" - [employer]  Warning: CBD is not FDA approved and has not undergo the same manufacturing controls as prescription drugs.  This means that the purity and safety of available CBD may be questionable. Most of the time, despite manufacturer's claims, it is contaminated with THC (delta-9-tetrahydrocannabinol - the chemical in marijuana responsible for the "HIGH").  When this is the case, the [facility] contaminant will trigger a positive urine drug screen (UDS) test for Marijuana (carboxy-THC). Because a positive UDS for any illicit substance is a violation of our medication agreement, your opioid analgesics (pain medicine) may be permanently discontinued.  MORE ABOUT CBD  General Information: CBD  is a derivative of the Marijuana (cannabis sativa) plant discovered in 69. It is one of the 113 identified substances found in  Marijuana. It accounts for up to 40% of the plant's extract. As of 2018, preliminary clinical studies on CBD included research for the treatment of anxiety, movement disorders, and pain. CBD is available and consumed in multiple forms, including inhalation of smoke or vapor, as an aerosol spray, and by mouth. It may be supplied as an oil containing CBD,  capsules, dried cannabis, or as a liquid solution. CBD is thought not to be as psychoactive as THC (delta-9-tetrahydrocannabinol - the chemical in marijuana responsible for the "HIGH"). Studies suggest that CBD may interact with different biological target receptors in the body, including cannabinoid and other neurotransmitter receptors. As of 2018 the mechanism of action for its biological effects has not been determined.  Side-effects  Adverse reactions: Dry mouth, diarrhea, decreased appetite, fatigue, drowsiness, malaise, weakness, sleep disturbances, and others.  Drug interactions: CBC may interact with other medications such as blood-thinners. (Last update: 06/11/2020) ____________________________________________________________________________________________  ____________________________________________________________________________________________  Drug Holidays (Slow)  What is a "Drug Holiday"? Drug Holiday: is the name given to the period of time during which a patient stops taking a medication(s) for the purpose of eliminating tolerance to the drug.  Benefits Improved effectiveness of opioids. Decreased opioid dose needed to achieve benefits. Improved pain with lesser dose.  What is tolerance? Tolerance: is the progressive decreased in effectiveness of a drug due to its repetitive use. With repetitive use, the body gets use to the medication and as a consequence, it loses its effectiveness. This is a common problem seen with opioid pain medications. As a result, a larger dose of the drug is needed to achieve the same effect that  used to be obtained with a smaller dose.  How long should a "Drug Holiday" last? You should stay off of the pain medicine for at least 14 consecutive days. (2 weeks)  Should I stop the medicine "cold [place]"? No. You should always coordinate with your Pain Specialist so that he/she can provide you with the correct medication dose to make the transition as smoothly as possible.  How do I stop the medicine? Slowly. You will be instructed to decrease the daily amount of pills that you take by one (1) pill every seven (7) days. This is called a "slow downward taper" of your dose. For example: if you normally take four (4) pills per day, you will be asked to drop this dose to three (3) pills per day for seven (7) days, then to two (2) pills per day for seven (7) days, then to one (1) per day for seven (7) days, and at the end of those last seven (7) days, this is when the "Drug Holiday" would start.   Will I have withdrawals? By doing a "slow downward taper" like this one, it is unlikely that you will experience any significant withdrawal symptoms. Typically, what triggers withdrawals is the sudden stop of a high dose opioid therapy. Withdrawals can usually be avoided by slowly decreasing the dose over a prolonged period of time. If you do not follow these instructions and decide to stop your medication abruptly, withdrawals may be possible.  What are withdrawals? Withdrawals: refers to the wide range of symptoms that occur after stopping or dramatically reducing opiate drugs after heavy and prolonged use. Withdrawal symptoms do not occur to patients that use low dose opioids, or those who take the medication sporadically. Contrary to benzodiazepine (example: Valium, Xanax, etc.) or alcohol withdrawals ("Delirium Tremens"), opioid withdrawals are not lethal. Withdrawals are the physical manifestation of the body getting rid of the excess receptors.  Expected Symptoms Early symptoms of withdrawal may  include: Agitation Anxiety Muscle aches Increased tearing Insomnia Runny nose Sweating Yawning  Late symptoms of withdrawal may include: Abdominal cramping Diarrhea Dilated pupils Goose bumps Nausea Vomiting  Will I experience withdrawals? Due to the slow nature of the taper, it is very unlikely  that you will experience any.  What is a slow taper? Taper: refers to the gradual decrease in dose.  (Last update: 05/25/2020) ____________________________________________________________________________________________

## 2021-06-19 NOTE — Progress Notes (Signed)
Nursing Pain Medication Assessment:  Safety precautions to be maintained throughout the outpatient stay will include: orient to surroundings, keep bed in low position, maintain call bell within reach at all times, provide assistance with transfer out of bed and ambulation.  Medication Inspection Compliance: Pill count conducted under aseptic conditions, in front of the patient. Neither the pills nor the bottle was removed from the patient's sight at any time. Once count was completed pills were immediately returned to the patient in their original bottle.  Medication: Oxycodone IR Pill/Patch Count:  16 of 90 pills remain Pill/Patch Appearance: Markings consistent with prescribed medication Bottle Appearance: Standard pharmacy container. Clearly labeled. Filled Date: 7 / 21 / 2022 Last Medication intake:  Today Safety precautions to be maintained throughout the outpatient stay will include: orient to surroundings, keep bed in low position, maintain call bell within reach at all times, provide assistance with transfer out of bed and ambulation.

## 2021-06-21 ENCOUNTER — Telehealth: Payer: Self-pay

## 2021-06-21 NOTE — Telephone Encounter (Signed)
His insurance will only consider approving a gonb if the patient has a dx of occipital neuralgia, cervical headaches. Please advise

## 2021-06-21 NOTE — Telephone Encounter (Signed)
I found the dx for occipital neuralgia. Disregard this

## 2021-06-26 ENCOUNTER — Encounter: Payer: Medicare Other | Admitting: Pain Medicine

## 2021-09-19 NOTE — Progress Notes (Signed)
PROVIDER NOTE: Information contained herein reflects review and annotations entered in association with encounter. Interpretation of such information and data should be left to medically-trained personnel. Information provided to patient can be located elsewhere in the medical record under "Patient Instructions". Document created using STT-dictation technology, any transcriptional errors that may result from process are unintentional.    Patient: James [name]  Service Category: E/M  Provider: Gaspar Cola, MD  DOB: 1956-01-27  DOS: 09/20/2021  Specialty: Interventional Pain Management  MRN: [id number]  Setting: Ambulatory outpatient  PCP: Sheffield Slider, MD  Type: Established Patient    Referring Provider: Sheffield Slider, MD  Location: Office  Delivery: Face-to-face     HPI  James [name], a 65 y.o. year old male, is here today because of his No primary diagnosis found.. James [name]'s primary complain today is Back Pain (lower) and Neck Pain Last encounter: My last encounter with him was on 06/19/2021. 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); Epidural fibrosis; Chronic musculoskeletal pain; Neurogenic pain; Neuropathic pain; Chronic sacroiliac joint pain  (Bilateral) (R>L); Psoriatic arthritis ([place]); Chronic lower extremity pain (2ry area of Pain) (Bilateral) (L>R); Psoriasis with arthropathy ([place]); Numbness and tingling of both legs; Abnormal nerve conduction studies (06/20/16); Chronic pain syndrome; Perineal pain; Chest pain with high risk for cardiac etiology; Chronic upper extremity pain(R>L); Cervicalgia; DDD (degenerative disc disease), cervical; Closed fracture of lumbar vertebral body ([place]) (L2); Neurogenic bladder s/p L2 fracture (04/19/2020); Lumbosacral radiculopathy at L5 (Right); Compression fracture of L2 lumbar vertebra, sequela; Abnormal MRI, cervical spine (09/19/2018); Trigger finger, ring finger (Left); and Occipital neuralgia (midline) on their pertinent problem list. Pain Assessment: Severity of Chronic pain is reported as a 6 /10. Location: Back Lower/hips bilateral down front and back of legs to feet bilaterally. Onset: More than a month ago. Quality: Aching, Stabbing, Throbbing, Burning, Constant. Timing: Constant. Modifying factor(s): rest,  medications. Vitals:  height is 6' (1.829 m) and weight is 285 lb (129.3 kg). His temperature is 97.2 F (36.2 C) (abnormal). His blood pressure is 132/100 (abnormal) and his pulse is 69. His respiration is 16 and oxygen saturation is 100%.   Reason for encounter: medication management.   The patient indicates doing well with the current medication regimen. No adverse reactions or side effects reported to the medications.   On 06/19/2021 I wrote for 3 oxycodone 20 mg prescriptions.  According to my review of the PMP today, he has only had 2 of those filled.  The last one was filled on 08/23/2021 which means that the next one should be filled on 09/22/2021 and should last until 10/22/2021.  Today I am writing for 3 prescriptions and the first one should be filled on 10/22/2021  RTCB: 01/20/2022 Nonopioids transferred 08/31/2020: Zanaflex  Pharmacotherapy Assessment  Analgesic: Oxycodone IR 20  mg, 1 tab PO q 8 hrs (60 mg/day of oxycodone) MME/day: 120 mg/day.   Monitoring:  PMP: PDMP reviewed during this  encounter.       Pharmacotherapy: No side-effects or adverse reactions reported. Compliance: No problems identified. Effectiveness: Clinically acceptable.  Ignatius Specking, RN  09/20/2021  8:21 AM  Sign when Signing Visit Nursing Pain Medication Assessment:  Safety precautions to be maintained throughout the outpatient stay will include: orient to surroundings, keep bed in low position, maintain call bell within reach at all times, provide assistance with transfer out of bed and ambulation.  Medication Inspection Compliance: Pill count conducted under aseptic conditions, in front of the patient. Neither the pills nor the bottle was removed from the patient's sight at any time. Once count was completed pills were immediately returned to the patient in their original bottle.  Medication: Oxycodone IR Pill/Patch Count:  9 of 90 pills remain Pill/Patch Appearance: Markings consistent with prescribed medication Bottle Appearance: Standard pharmacy container. Clearly labeled. Filled Date: 59 / 19 / 2022 Last Medication intake:  Today    UDS:  Summary  Date Value Ref Range Status  03/22/2021 Note  Final    Comment:    ==================================================================== [facility] (MW) ==================================================================== Test                             Result       Flag       Units  Drug Present and Declared for Prescription Verification   Oxycodone                      1249         EXPECTED   ng/mg creat   Oxymorphone                    832          EXPECTED   ng/mg creat   Noroxycodone                   5312         EXPECTED   ng/mg creat   Noroxymorphone                 285          EXPECTED   ng/mg creat    Sources of oxycodone are scheduled prescription medications.    Oxymorphone, noroxycodone, and  noroxymorphone are expected    metabolites of oxycodone. Oxymorphone is also available as a    scheduled prescription medication.  ==================================================================== Test                      Result    Flag   Units      Ref Range   Creatinine              179              mg/dL      >=20 ==================================================================== Declared Medications:  The flagging and interpretation on this report are based on the  following declared medications.  Unexpected results may arise from  inaccuracies in the declared medications.   **Note: The testing scope of this panel includes these medications:   Oxycodone   **Note: The testing scope of this panel does not include the  following reported medications:   Albuterol (Ventolin HFA)  Loratadine (Claritin)  Metoprolol  Multivitamin  Naloxone (Narcan)  Tizanidine (Zanaflex)  Vitamin B ==================================================================== For clinical consultation, please call [phone]. ====================================================================      ROS  Constitutional: Denies any fever or chills Gastrointestinal: No reported hemesis, hematochezia, vomiting, or acute GI distress Musculoskeletal: Denies any acute onset joint swelling, redness, loss of ROM, or weakness Neurological: No reported episodes of acute onset apraxia, aphasia, dysarthria, agnosia, amnesia, paralysis, loss of coordination, or loss of consciousness  Medication Review  Ergocalciferol, Oxycodone HCl, albuterol, b complex vitamins, loratadine, metoprolol tartrate, multivitamin, naloxone, and tiZANidine  History Review  Allergy: James [name] is allergic to penicillins, lactose, nsaids, talwin [pentazocine], codeine, procaine, and sulfa antibiotics. Drug: James [name]  reports no history of drug use. Alcohol:  reports no history of alcohol use. Tobacco:  reports that he has  quit smoking. His smoking use included cigarettes. His smokeless tobacco use includes chew. Social: James [name]  reports that he has quit smoking. His smoking use included cigarettes. His smokeless tobacco use includes chew. He reports that he does not drink alcohol and does not use drugs. Medical:  has a past medical history of Allergy, Anemia, Arthritis, Asthma, Blood transfusion without reported diagnosis, Bronchitis, Bursitis, Cancer ([place]), Cataract, 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09/13/2015), Emphysema of lung ([place]), Gastritis, GERD (gastroesophageal reflux disease), Heart murmur, Hepatitis C, Hiatal hernia, Hypertension, Hypothyroidism, Kidney stone, Lupus ([place]), Obesity, Osteoporosis, Peripheral nerve disease, Psoriatic arthritis ([place]), Sleep apnea, Supraventricular tachycardia ([place]), Tendonitis, and Thyroid disease. Surgical: James [name]  has a past surgical history that includes Spine surgery; Eye surgery; Fracture surgery (Right); Fracture surgery (Bilateral); Fracture surgery (Bilateral); Joint replacement (Right); Spinal cord stimulator implant; Spinal cord stimulator removal; Patella arthroplasty; Shoulder arthroscopy (Bilateral); Esophagogastroduodenoscopy (egd) with propofol (N/A, 01/20/2016); and Colonoscopy (2009). Family: family history includes Arthritis in his father and mother; COPD in his father; Cancer in his father and mother; Diabetes in his mother; Hematuria in his father and mother; Hypertension in his father and mother; Kidney disease in his mother; Kidney failure in his mother; Prostate cancer in his father; Stroke in his mother.  Laboratory Chemistry Profile   Renal Lab Results  Component Value Date   BUN 15 08/19/2019   CREATININE 1.47 (H) 08/19/2019   GFRAA 59 (L) 08/19/2019   GFRNONAA 51 (L) 08/19/2019    Hepatic Lab Results  Component Value Date   AST 25 08/19/2019   ALT 24 08/19/2019   ALBUMIN 4.2 08/19/2019   ALKPHOS 47  08/19/2019    Electrolytes Lab Results  Component Value Date   NA 138 08/19/2019   K 4.1 08/19/2019   CL 100 08/19/2019   CALCIUM 9.3 08/19/2019   MG 2.1 08/19/2019    Bone Lab Results  Component Value Date   VD25OH 26.19 (L) 08/19/2019   25OHVITD1 42 12/06/2016   25OHVITD2 <1.0 12/06/2016   25OHVITD3 42 12/06/2016   TESTOSTERONE 285 02/12/2018    Inflammation (CRP: Acute Phase) (ESR: Chronic Phase) Lab Results  Component Value Date   CRP <0.8 08/19/2019   ESRSEDRATE 1 08/19/2019         Note: Above Lab results reviewed.  Recent Imaging Review  MR CERVICAL SPINE WO CONTRAST CLINICAL DATA:  Neck pain worse on the right with radiculopathy. Numbness in the arms and hands with worsening symptoms in the past 6 months.  EXAM: MRI CERVICAL SPINE WITHOUT CONTRAST  TECHNIQUE: Multiplanar, multisequence MR imaging of the cervical spine was performed. No intravenous contrast was administered.  COMPARISON:  12/13/2016  FINDINGS: Alignment: Chronic cervical spine straightening. Unchanged minimal anterolisthesis of C3 on C4.  Vertebrae: No fracture or suspicious osseous lesion. Chronic degenerative endplate changes at D3-5  with decreased degenerative endplate edema.  Cord: Normal signal.  Posterior Fossa, vertebral arteries, paraspinal tissues: Unremarkable.  Disc levels:  C2-3: Mild right and moderate to severe left facet arthrosis without stenosis, unchanged.  C3-4: Central disc protrusion, uncovertebral spurring, and severe right and moderate left facet arthrosis result in borderline spinal stenosis with slight ventral cord flattening and moderate right neural foraminal stenosis, unchanged.  C4-5: Minimal disc bulging, mild uncovertebral spurring, and moderate right facet arthrosis without stenosis, unchanged.  C5-6: Moderate disc space narrowing. Disc bulging, uncovertebral spurring, and moderate right and mild left facet arthrosis result in mild spinal  stenosis and moderate right greater than left neural foraminal stenosis, unchanged.  C6-7: Severe disc space narrowing. Broad-based posterior disc osteophyte complex results in mild spinal stenosis and mild-to-moderate right and borderline left neural foraminal stenosis, unchanged.  C7-T1: Minimal disc bulging and moderate to severe right and mild left facet arthrosis result in mild right neural foraminal stenosis without spinal stenosis, unchanged.  IMPRESSION: 1. Decreased degenerative endplate edema at K0-2. 2. Otherwise unchanged appearance of the cervical spine with up to mild spinal and moderate neural foraminal stenosis at multiple levels as above.  Electronically Signed   By: Logan Bores M.D.   On: 09/19/2018 10:30 Note: Reviewed        Physical Exam  General appearance: Well nourished, well developed, and well hydrated. In no apparent acute distress Mental status: Alert, oriented x 3 (person, place, & time)       Respiratory: No evidence of acute respiratory distress Eyes: PERLA Vitals: BP (!) 132/100   Pulse 69   Temp (!) 97.2 F (36.2 C)   Resp 16   Ht 6' (1.829 m)   Wt 285 lb (129.3 kg)   SpO2 100%   BMI 38.65 kg/m  BMI: Estimated body mass index is 38.65 kg/m as calculated from the following:   Height as of this encounter: 6' (1.829 m).   Weight as of this encounter: 285 lb (129.3 kg). Ideal: Ideal body weight: 77.6 kg (171 lb 1.2 oz) Adjusted ideal body weight: 98.3 kg (216 lb 10.3 oz)  Assessment   Status Diagnosis  Controlled Controlled Controlled 1. Chronic pain syndrome   2. Chronic low back pain (1ry area of Pain) (Bilateral) (R>L)   3. Chronic lower extremity pain (2ry area of Pain) (Bilateral) (L>R)   4. Cervicalgia (Right)   5. Cervical spondylosis (C7 intravertebral body cyst)   6. Chronic cervical radicular pain (Right)   7. Chronic knee pain (Bilateral) (R>L)   8. 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  Pharmacologic therapy   10. Chronic use of opiate for therapeutic purpose   11. Encounter for medication management      Updated Problems: No problems updated.  Plan of Care  Problem-specific:  No problem-specific Assessment & Plan notes found for this encounter.  James [name] has a current medication list which includes the following long-term medication(s): albuterol, naloxone, oxycodone hcl, [START ON 10/22/2021] oxycodone hcl, [START ON 11/21/2021] oxycodone hcl, [START ON 12/21/2021] oxycodone hcl, and tizanidine.  Pharmacotherapy (Medications Ordered): Meds ordered this encounter  Medications   Oxycodone HCl 20 MG TABS    Sig: Take 1 tablet (20 mg total) by mouth every 8 (eight) hours as needed. Must last 30 days    Dispense:  90 tablet    Refill:  0    DO NOT: delete (not duplicate); no partial-fill (will deny script to complete), no  refill request (F/U required). DISPENSE: 1 day early if closed on fill date. WARN: No CNS-depressants within 8 hrs of med.   Oxycodone HCl 20 MG TABS    Sig: Take 1 tablet (20 mg total) by mouth every 8 (eight) hours as needed. Must last 30 days    Dispense:  90 tablet    Refill:  0    DO NOT: delete (not duplicate); no partial-fill (will deny script to complete), no refill request (F/U required). DISPENSE: 1 day early if closed on fill date. WARN: No CNS-depressants within 8 hrs of med.   Oxycodone HCl 20 MG TABS    Sig: Take 1 tablet (20 mg total) by mouth every 8 (eight) hours as needed. Must last 30 days    Dispense:  90 tablet    Refill:  0    DO NOT: delete (not duplicate); no partial-fill (will deny script to complete), no refill request (F/U required). DISPENSE: 1 day early if closed on fill date. WARN: No CNS-depressants within 8 hrs of med.    Orders:  No orders of the defined types were placed in this encounter.  Follow-up plan:   Return in about 4 months (around 01/20/2022) for Eval-day (M,W), (F2F), (MM).      Interventional therapies:  Considering:   Possible bilateral lumbar facet RFA  Possible bilateral cervical facet RFA    Palliative PRN treatment(s):   Palliative right CESI  Palliative right L4-5 LESI #2   Palliative bilateral lumbar facet blocks  Palliative left lumbar facet RFA #2 (Last done 07/18/17) Palliative bilateral cervical facet blocks    Recent Visits No visits were found meeting these conditions. Showing recent visits within past 90 days and meeting all other requirements Today's Visits Date Type Provider Dept  09/20/21 Office Visit Milinda Pointer, MD [facility]  Showing today's visits and meeting all other requirements Future Appointments No visits were found meeting these conditions. Showing future appointments within next 90 days and meeting all other requirements I discussed the assessment and treatment plan with the patient. The patient was provided an opportunity to ask questions and all were answered. The patient agreed with the plan and demonstrated an understanding of the instructions.  Patient advised to call back or seek an in-person evaluation if the symptoms or condition worsens.  Duration of encounter: 30 minutes.  Note by: Gaspar Cola, MD Date: 09/20/2021; Time: 8:37 AM

## 2021-09-20 ENCOUNTER — Encounter: Payer: Self-pay | Admitting: Pain Medicine

## 2021-09-20 ENCOUNTER — Other Ambulatory Visit: Payer: Self-pay

## 2021-09-20 ENCOUNTER — Ambulatory Visit: Payer: Medicare Other | Attending: Pain Medicine | Admitting: Pain Medicine

## 2021-09-20 DIAGNOSIS — M79605 Pain in left leg: Secondary | ICD-10-CM | POA: Diagnosis present

## 2021-09-20 DIAGNOSIS — M47812 Spondylosis without myelopathy or radiculopathy, cervical region: Secondary | ICD-10-CM

## 2021-09-20 DIAGNOSIS — M25562 Pain in left knee: Secondary | ICD-10-CM

## 2021-09-20 DIAGNOSIS — M961 Postlaminectomy syndrome, not elsewhere classified: Secondary | ICD-10-CM | POA: Diagnosis present

## 2021-09-20 DIAGNOSIS — M5412 Radiculopathy, cervical region: Secondary | ICD-10-CM

## 2021-09-20 DIAGNOSIS — M545 Low back pain, unspecified: Secondary | ICD-10-CM | POA: Diagnosis present

## 2021-09-20 DIAGNOSIS — G8929 Other chronic pain: Secondary | ICD-10-CM

## 2021-09-20 DIAGNOSIS — Z79891 Long term (current) use of opiate analgesic: Secondary | ICD-10-CM | POA: Diagnosis present

## 2021-09-20 DIAGNOSIS — Z79899 Other long term (current) drug therapy: Secondary | ICD-10-CM | POA: Diagnosis present

## 2021-09-20 DIAGNOSIS — M79604 Pain in right leg: Secondary | ICD-10-CM | POA: Diagnosis present

## 2021-09-20 DIAGNOSIS — M542 Cervicalgia: Secondary | ICD-10-CM | POA: Diagnosis present

## 2021-09-20 DIAGNOSIS — M25561 Pain in right knee: Secondary | ICD-10-CM | POA: Diagnosis present

## 2021-09-20 DIAGNOSIS — G894 Chronic pain syndrome: Secondary | ICD-10-CM

## 2021-09-20 MED ORDER — OXYCODONE HCL 20 MG PO TABS: 20.0000 mg | ORAL_TABLET | Freq: Three times a day (TID) | ORAL | 0 refills | Status: DC | PRN

## 2021-09-20 NOTE — Progress Notes (Signed)
Nursing Pain Medication Assessment:  Safety precautions to be maintained throughout the outpatient stay will include: orient to surroundings, keep bed in low position, maintain call bell within reach at all times, provide assistance with transfer out of bed and ambulation.  Medication Inspection Compliance: Pill count conducted under aseptic conditions, in front of the patient. Neither the pills nor the bottle was removed from the patient's sight at any time. Once count was completed pills were immediately returned to the patient in their original bottle.  Medication: Oxycodone IR Pill/Patch Count:  9 of 90 pills remain Pill/Patch Appearance: Markings consistent with prescribed medication Bottle Appearance: Standard pharmacy container. Clearly labeled. Filled Date: 20 / 19 / 2022 Last Medication intake:  Today

## 2022-01-01 DIAGNOSIS — R4589 Other symptoms and signs involving emotional state: Secondary | ICD-10-CM | POA: Insufficient documentation

## 2022-01-01 DIAGNOSIS — F419 Anxiety disorder, unspecified: Secondary | ICD-10-CM | POA: Insufficient documentation

## 2022-01-03 ENCOUNTER — Telehealth: Payer: Self-pay | Admitting: Pain Medicine

## 2022-01-03 NOTE — Telephone Encounter (Signed)
Patient wanted to let us know he had to go to the ED due to a back injury and was given some pain meds for the acute pain. Please call so he can explain ?

## 2022-01-03 NOTE — Telephone Encounter (Signed)
Patient reports that he fell on Monday and had to call EMS,  they took him to [facility].  There were many test run including MRI and he is going to need surgery.  L4,L5, S1 collapsed on top of each other and now something is pressing on a nerve causing foot drop on the right.  He is able to walk but has to drag his foot and leg.  Nothing is scheduled yet.  He is going to need surgery but he would like to talk to  ?FN first before proceeding.   ? ?Spoke with FN and he would like patient to go ahead and have the surgery that is recommended.  He did ask who we would recommend for fusion or kyphoplasty and I told him [facility] neurology.  I also told him that if he needs his MM appt to be [facility] Dr Dossie Arbour has agreed to this. Patient verbalizes u/o instructions.  ?

## 2022-01-14 NOTE — Progress Notes (Signed)
Patient: James [name]  Service Category: E/M  Provider: Gaspar Cola, MD  DOB: 13-Mar-1956  DOS: 01/15/2022  Location: Office  MRN: [id number]  Setting: Ambulatory outpatient  Referring Provider: Sheffield Slider, MD  Type: Established Patient  Specialty: Interventional Pain Management  PCP: Sheffield Slider, MD  Location: Remote location  Delivery: TeleHealth     Virtual Encounter - Pain Management PROVIDER NOTE: Information contained herein reflects review and annotations entered in association with encounter. Interpretation of such information and data should be left to medically-trained personnel. Information provided to patient can be located elsewhere in the medical record under "Patient Instructions". Document created using STT-dictation technology, any transcriptional errors that may result from process are unintentional.    Contact & Pharmacy Preferred: [phone] Home: [phone] (home) Mobile: [phone] (mobile) E-mail: [email or web address]  [facility] [place] [place] [address] [place] [address] Phone: [phone] Fax: [phone]   Pre-screening  James [name] offered "in-person" vs "virtual" encounter. He indicated preferring virtual for this encounter.   Reason COVID-19*   Social distancing based on CDC and AMA recommendations.   I contacted Sanjuana Kava on 01/15/2022 via telephone.      I clearly identified myself as Gaspar Cola, MD. I verified that I was speaking with the correct person using two identifiers (Name: James [name], and date of birth: 11-05-1956).  Consent I sought verbal advanced consent from Sanjuana Kava for virtual visit interactions. I informed James [name] of possible security and privacy concerns, risks, and limitations associated with providing "not-in-person" medical evaluation and management services. I also informed James [name] of the availability of  "in-person" appointments. Finally, I informed him that there would be a charge for the virtual visit and that he could be  personally, fully or partially, financially responsible for it. Mr. [place] expressed understanding and agreed to proceed.   Historic Elements   James [name] is a 66 y.o. year old, male patient evaluated today after our last contact on 01/03/2022. 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 He also  has a past surgical history that includes Spine surgery; Eye surgery; Fracture surgery (Right); Fracture surgery (Bilateral); Fracture surgery (Bilateral); Joint replacement (Right); Spinal cord stimulator implant; Spinal cord stimulator removal; Patella arthroplasty; Shoulder arthroscopy (Bilateral); Esophagogastroduodenoscopy (egd) with propofol (N/A, 01/20/2016); and Colonoscopy (2009). James [name] has a current medication list which includes the following prescription(s): albuterol, b complex vitamins, ergocalciferol, levothyroxine, loratadine, metoprolol tartrate, multivitamin, naloxone, tizanidine, [START ON 01/20/2022] oxycodone hcl, and [START ON 02/19/2022] oxycodone hcl. He  reports that he has quit smoking. His smoking use included cigarettes. His smokeless tobacco use includes chew. He reports that he does not drink alcohol and does not use drugs. James [name] is allergic to penicillins, lactose, nsaids, talwin [pentazocine], codeine, procaine, and  sulfa antibiotics.   HPI  Today, he is being contacted for medication management.  The patient indicates doing well with the current medication regimen. No adverse reactions or side effects reported to the medications.  He is pending some surgery on 01/23/2022 for possible  fusion of the lower lumbar spine.  He will probably be receiving some pain medication after the surgery.  Today he has informed us of that.  RTCB: 03/21/2022 Nonopioids transferred 08/31/2020: Zanaflex  Pharmacotherapy Assessment   Opioid Analgesic: Oxycodone IR 20 mg, 1 tab PO q 8 hrs (60 mg/day of oxycodone) MME/day: 120 mg/day.   Monitoring: [place] PMP: PDMP reviewed during this encounter.       Pharmacotherapy: No side-effects or adverse reactions reported. Compliance: No problems identified. Effectiveness: Clinically acceptable. Plan: Refer to "POC". UDS:  Summary  Date Value Ref Range Status  03/22/2021 Note  Final    Comment:    ==================================================================== [facility] Select 13 (MW) ==================================================================== Test                             Result       Flag       Units  Drug Present and Declared for Prescription Verification   Oxycodone                      1249         EXPECTED   ng/mg creat   Oxymorphone                    832          EXPECTED   ng/mg creat   Noroxycodone                   5312         EXPECTED   ng/mg creat   Noroxymorphone                 285          EXPECTED   ng/mg creat    Sources of oxycodone are scheduled prescription medications.    Oxymorphone, noroxycodone, and noroxymorphone are expected    metabolites of oxycodone. Oxymorphone is also available as a    scheduled prescription medication.  ==================================================================== Test                      Result    Flag   Units      Ref Range   Creatinine              179              mg/dL       >=20 ==================================================================== Declared Medications:  The flagging and interpretation on this report are based on the  following declared medications.  Unexpected results may arise from  inaccuracies in the declared medications.   **Note: The testing scope of this panel includes these medications:   Oxycodone   **Note: The testing scope of this panel does not include the  following reported medications:   Albuterol (Ventolin HFA)  Loratadine (Claritin)  Metoprolol  Multivitamin  Naloxone (Narcan)  Tizanidine (Zanaflex)  Vitamin B ==================================================================== For clinical consultation, please call [phone]. ====================================================================      Laboratory Chemistry Profile   Renal Lab Results  Component Value Date   BUN 15 08/19/2019   CREATININE 1.47 (H) 08/19/2019   GFRAA 59 (L) 08/19/2019   GFRNONAA 51 (L) 08/19/2019    Hepatic Lab Results  Component Value Date   AST 25 08/19/2019   ALT 24 08/19/2019   ALBUMIN 4.2 08/19/2019   ALKPHOS 47 08/19/2019    Electrolytes Lab Results  Component Value  Date   NA 138 08/19/2019   K 4.1 08/19/2019   CL 100 08/19/2019   CALCIUM 9.3 08/19/2019   MG 2.1 08/19/2019    Bone Lab Results  Component Value Date   VD25OH 26.19 (L) 08/19/2019   25OHVITD1 42 12/06/2016   25OHVITD2 <1.0 12/06/2016   25OHVITD3 42 12/06/2016   TESTOSTERONE 285 02/12/2018    Inflammation (CRP: Acute Phase) (ESR: Chronic Phase) Lab Results  Component Value Date   CRP <0.8 08/19/2019   ESRSEDRATE 1 08/19/2019         Note: Above Lab results reviewed.  Imaging  MR CERVICAL SPINE WO CONTRAST CLINICAL DATA:  Neck pain worse on the right with radiculopathy. Numbness in the arms and hands with worsening symptoms in the past 6 months.  EXAM: MRI CERVICAL SPINE WITHOUT CONTRAST  TECHNIQUE: Multiplanar,  multisequence MR imaging of the cervical spine was performed. No intravenous contrast was administered.  COMPARISON:  12/13/2016  FINDINGS: Alignment: Chronic cervical spine straightening. Unchanged minimal anterolisthesis of C3 on C4.  Vertebrae: No fracture or suspicious osseous lesion. Chronic degenerative endplate changes at H4-7 with decreased degenerative endplate edema.  Cord: Normal signal.  Posterior Fossa, vertebral arteries, paraspinal tissues: Unremarkable.  Disc levels:  C2-3: Mild right and moderate to severe left facet arthrosis without stenosis, unchanged.  C3-4: Central disc protrusion, uncovertebral spurring, and severe right and moderate left facet arthrosis result in borderline spinal stenosis with slight ventral cord flattening and moderate right neural foraminal stenosis, unchanged.  C4-5: Minimal disc bulging, mild uncovertebral spurring, and moderate right facet arthrosis without stenosis, unchanged.  C5-6: Moderate disc space narrowing. Disc bulging, uncovertebral spurring, and moderate right and mild left facet arthrosis result in mild spinal stenosis and moderate right greater than left neural foraminal stenosis, unchanged.  C6-7: Severe disc space narrowing. Broad-based posterior disc osteophyte complex results in mild spinal stenosis and mild-to-moderate right and borderline left neural foraminal stenosis, unchanged.  C7-T1: Minimal disc bulging and moderate to severe right and mild left facet arthrosis result in mild right neural foraminal stenosis without spinal stenosis, unchanged.  IMPRESSION: 1. Decreased degenerative endplate edema at Q2-5. 2. Otherwise unchanged appearance of the cervical spine with up to mild spinal and moderate neural foraminal stenosis at multiple levels as above.  Electronically Signed   By: Logan Bores M.D.   On: 09/19/2018 10:30  Assessment  The primary encounter diagnosis was Chronic pain syndrome.  Diagnoses of Chronic low back pain (1ry area of Pain) (Bilateral) (R>L), Chronic lower extremity pain (2ry area of Pain) (Bilateral) (L>R), Cervicalgia (Right), Cervical spondylosis (C7 intravertebral body cyst), Chronic cervical radicular pain (Right), Chronic knee pain (Bilateral) (R>L), 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), Pharmacologic therapy, Chronic use of opiate for therapeutic purpose, and Encounter for medication management were also pertinent to this visit.  Plan of Care  Problem-specific:  No problem-specific Assessment & Plan notes found for this encounter.  James [name] has a current medication list which includes the following long-term medication(s): albuterol, naloxone, and tizanidine.  Pharmacotherapy (Medications Ordered): Meds ordered this encounter  Medications   Oxycodone HCl 20 MG TABS    Sig: Take 1 tablet (20 mg total) by mouth every 8 (eight) hours as needed. Must last 30 days    Dispense:  90 tablet    Refill:  0    DO NOT: delete (not duplicate); no partial-fill (will deny script to complete), no refill request (F/U required). DISPENSE: 1 day early if  closed on fill date. WARN: No CNS-depressants within 8 hrs of med.   Oxycodone HCl 20 MG TABS    Sig: Take 1 tablet (20 mg total) by mouth every 8 (eight) hours as needed. Must last 30 days    Dispense:  90 tablet    Refill:  0    DO NOT: delete (not duplicate); no partial-fill (will deny script to complete), no refill request (F/U required). DISPENSE: 1 day early if closed on fill date. WARN: No CNS-depressants within 8 hrs of med.   Orders:  No orders of the defined types were placed in this encounter.  Follow-up plan:   Return in about 2 months (around 03/21/2022) for Eval-day (M,W), (F2F), (MM).     Interventional Therapies  Risk   Complexity Considerations:   Estimated body mass index is 38.65 kg/m as calculated from the following:   Height as of 09/20/21: 6' (1.829 m).    Weight as of 09/20/21: 285 lb (129.3 kg). WNL   Planned   Pending:   Pending lumbar spine surgery on 01/23/2022.   Under consideration:      Completed:   Therapeutic right L4-5 LESI x1 (06/26/2016) (50/50/25)  Therapeutic bilateral lumbar facet MBB x2 (04/15/2017) (90/90/50/50)  Therapeutic left lumbar facet RFA x1 (07/18/17) (90/90/90/90)    Therapeutic   Palliative (PRN) options:   Palliative right CESI  Palliative right L4-5 LESI #2   Palliative bilateral lumbar facet MBB  Palliative left lumbar facet RFA #2  Palliative bilateral cervical facet MBB     Recent Visits No visits were found meeting these conditions. Showing recent visits within past 90 days and meeting all other requirements Today's Visits Date Type Provider Dept  01/15/22 Office Visit Milinda Pointer, MD [facility]  Showing today's visits and meeting all other requirements Future Appointments No visits were found meeting these conditions. Showing future appointments within next 90 days and meeting all other requirements  I discussed the assessment and treatment plan with the patient. The patient was provided an opportunity to ask questions and all were answered. The patient agreed with the plan and demonstrated an understanding of the instructions.  Patient advised to call back or seek an in-person evaluation if the symptoms or condition worsens.  Duration of encounter: 12 minutes.  Note by: Gaspar Cola, MD Date: 01/15/2022; Time: 8:28 AM

## 2022-01-15 ENCOUNTER — Ambulatory Visit: Payer: Medicare Other | Attending: Pain Medicine | Admitting: Pain Medicine

## 2022-01-15 ENCOUNTER — Other Ambulatory Visit: Payer: Self-pay

## 2022-01-15 DIAGNOSIS — M545 Low back pain, unspecified: Secondary | ICD-10-CM | POA: Diagnosis not present

## 2022-01-15 DIAGNOSIS — G894 Chronic pain syndrome: Secondary | ICD-10-CM

## 2022-01-15 DIAGNOSIS — M5412 Radiculopathy, cervical region: Secondary | ICD-10-CM

## 2022-01-15 DIAGNOSIS — M542 Cervicalgia: Secondary | ICD-10-CM | POA: Diagnosis not present

## 2022-01-15 DIAGNOSIS — Z79891 Long term (current) use of opiate analgesic: Secondary | ICD-10-CM

## 2022-01-15 DIAGNOSIS — M79604 Pain in right leg: Secondary | ICD-10-CM | POA: Diagnosis not present

## 2022-01-15 DIAGNOSIS — Z79899 Other long term (current) drug therapy: Secondary | ICD-10-CM

## 2022-01-15 DIAGNOSIS — G8929 Other chronic pain: Secondary | ICD-10-CM

## 2022-01-15 DIAGNOSIS — M961 Postlaminectomy syndrome, not elsewhere classified: Secondary | ICD-10-CM

## 2022-01-15 DIAGNOSIS — M79605 Pain in left leg: Secondary | ICD-10-CM

## 2022-01-15 DIAGNOSIS — M47812 Spondylosis without myelopathy or radiculopathy, cervical region: Secondary | ICD-10-CM

## 2022-01-15 MED ORDER — OXYCODONE HCL 20 MG PO TABS: 20.0000 mg | ORAL_TABLET | Freq: Three times a day (TID) | ORAL | 0 refills | Status: DC | PRN

## 2022-01-15 NOTE — Patient Instructions (Signed)
____________________________________________________________________________________________ ° °Medication Rules ° °Purpose: To inform patients, and their family members, of our rules and regulations. ° °Applies to: All patients receiving prescriptions (written or electronic). ° °Pharmacy of record: Pharmacy where electronic prescriptions will be sent. If written prescriptions are taken to a different pharmacy, please inform the nursing staff. The pharmacy listed in the electronic medical record should be the one where you would like electronic prescriptions to be sent. ° °Electronic prescriptions: In compliance with the [place] Strengthen Opioid Misuse Prevention (STOP) Act of 2017 (Session Law 2017-74/H243), effective November 05, 2018, all controlled substances must be electronically prescribed. Calling prescriptions to the pharmacy will cease to exist. ° °Prescription refills: Only during scheduled appointments. Applies to all prescriptions. ° °NOTE: The following applies primarily to controlled substances (Opioid* Pain Medications).  ° °Type of encounter (visit): For patients receiving controlled substances, face-to-face visits are required. (Not an option or up to the patient.) ° °Patient's responsibilities: °Pain Pills: Bring all pain pills to every appointment (except for procedure appointments). °Pill Bottles: Bring pills in original pharmacy bottle. Always bring the newest bottle. Bring bottle, even if empty. °Medication refills: You are responsible for knowing and keeping track of what medications you take and those you need refilled. °The day before your appointment: write a list of all prescriptions that need to be refilled. °The day of the appointment: give the list to the admitting nurse. Prescriptions will be written only during appointments. No prescriptions will be written on procedure days. °If you forget a medication: it will not be "Called in", "Faxed", or "electronically sent". You will  need to get another appointment to get these prescribed. °No early refills. Do not call asking to have your prescription filled early. °Prescription Accuracy: You are responsible for carefully inspecting your prescriptions before leaving our office. Have the discharge nurse carefully go over each prescription with you, before taking them home. Make sure that your name is accurately spelled, that your address is correct. Check the name and dose of your medication to make sure it is accurate. Check the number of pills, and the written instructions to make sure they are clear and accurate. Make sure that you are given enough medication to last until your next medication refill appointment. °Taking Medication: Take medication as prescribed. When it comes to controlled substances, taking less pills or less frequently than prescribed is permitted and encouraged. °Never take more pills than instructed. °Never take medication more frequently than prescribed.  °Inform other Doctors: Always inform, all of your healthcare providers, of all the medications you take. °Pain Medication from other Providers: You are not allowed to accept any additional pain medication from any other Doctor or Healthcare provider. There are two exceptions to this rule. (see below) In the event that you require additional pain medication, you are responsible for notifying us, as stated below. °Cough Medicine: Often these contain an opioid, such as codeine or hydrocodone. Never accept or take cough medicine containing these opioids if you are already taking an opioid* medication. The combination may cause respiratory failure and death. °Medication Agreement: You are responsible for carefully reading and following our Medication Agreement. This must be signed before receiving any prescriptions from our practice. Safely store a copy of your signed Agreement. Violations to the Agreement will result in no further prescriptions. (Additional copies of our  Medication Agreement are available upon request.) °Laws, Rules, & Regulations: All patients are expected to follow all Federal and State Laws, Statutes, Rules, & Regulations. Ignorance of   the Laws does not constitute a valid excuse.  °Illegal drugs and Controlled Substances: The use of illegal substances (including, but not limited to marijuana and its derivatives) and/or the illegal use of any controlled substances is strictly prohibited. Violation of this rule may result in the immediate and permanent discontinuation of any and all prescriptions being written by our practice. The use of any illegal substances is prohibited. °Adopted CDC guidelines & recommendations: Target dosing levels will be at or below 60 MME/day. Use of benzodiazepines** is not recommended. ° °Exceptions: There are only two exceptions to the rule of not receiving pain medications from other Healthcare Providers. °Exception #1 (Emergencies): In the event of an emergency (i.e.: accident requiring emergency care), you are allowed to receive additional pain medication. However, you are responsible for: As soon as you are able, call our office [phone], at any time of the day or night, and leave a message stating your name, the date and nature of the emergency, and the name and dose of the medication prescribed. In the event that your call is answered by a member of our staff, make sure to document and save the date, time, and the name of the person that took your information.  °Exception #2 (Planned Surgery): In the event that you are scheduled by another doctor or dentist to have any type of surgery or procedure, you are allowed (for a period no longer than 30 days), to receive additional pain medication, for the acute post-op pain. However, in this case, you are responsible for picking up a copy of our "Post-op Pain Management for Surgeons" handout, and giving it to your surgeon or dentist. This document is available at our office, and  does not require an appointment to obtain it. Simply go to our office during business hours (Monday-Thursday from 8:00 AM to 4:00 PM) (Friday 8:00 AM to 12:00 Noon) or if you have a scheduled appointment with us, prior to your surgery, and ask for it by name. In addition, you are responsible for: calling our office [phone], at any time of the day or night, and leaving a message stating your name, name of your surgeon, type of surgery, and date of procedure or surgery. Failure to comply with your responsibilities may result in termination of therapy involving the controlled substances. °Medication Agreement Violation. Following the above rules, including your responsibilities will help you in avoiding a Medication Agreement Violation (“Breaking your Pain Medication Contract”). ° °*Opioid medications include: morphine, codeine, oxycodone, oxymorphone, hydrocodone, hydromorphone, meperidine, tramadol, tapentadol, buprenorphine, fentanyl, methadone. °**Benzodiazepine medications include: diazepam (Valium), alprazolam (Xanax), clonazepam (Klonopine), lorazepam (Ativan), clorazepate (Tranxene), chlordiazepoxide (Librium), estazolam (Prosom), oxazepam (Serax), temazepam (Restoril), triazolam (Halcion) °(Last updated: 08/02/2021) °____________________________________________________________________________________________ ° ____________________________________________________________________________________________ ° °Medication Recommendations and Reminders ° °Applies to: All patients receiving prescriptions (written and/or electronic). ° °Medication Rules & Regulations: These rules and regulations exist for your safety and that of others. They are not flexible and neither are we. Dismissing or ignoring them will be considered "non-compliance" with medication therapy, resulting in complete and irreversible termination of such therapy. (See document titled "Medication Rules" for more details.) In all conscience,  because of safety reasons, we cannot continue providing a therapy where the patient does not follow instructions. ° °Pharmacy of record:  °Definition: This is the pharmacy where your electronic prescriptions will be sent.  °We do not endorse any particular pharmacy, however, we have experienced problems with [facility] not securing enough medication supply for the community. °We do not restrict you   in your choice of pharmacy. However, once we write for your prescriptions, we will NOT be re-sending more prescriptions to fix restricted supply problems created by your pharmacy, or your insurance.  °The pharmacy listed in the electronic medical record should be the one where you want electronic prescriptions to be sent. °If you choose to change pharmacy, simply notify our nursing staff. ° °Recommendations: °Keep all of your pain medications in a safe place, under lock and key, even if you live alone. We will NOT replace lost, stolen, or damaged medication. °After you fill your prescription, take 1 week's worth of pills and put them away in a safe place. You should keep a separate, properly labeled bottle for this purpose. The remainder should be kept in the original bottle. Use this as your primary supply, until it runs out. Once it's gone, then you know that you have 1 week's worth of medicine, and it is time to come in for a prescription refill. If you do this correctly, it is unlikely that you will ever run out of medicine. °To make sure that the above recommendation works, it is very important that you make sure your medication refill appointments are scheduled at least 1 week before you run out of medicine. To do this in an effective manner, make sure that you do not leave the office without scheduling your next medication management appointment. Always ask the nursing staff to show you in your prescription , when your medication will be running out. Then arrange for the receptionist to get you a return appointment,  at least 7 days before you run out of medicine. Do not wait until you have 1 or 2 pills left, to come in. This is very poor planning and does not take into consideration that we may need to cancel appointments due to bad weather, sickness, or emergencies affecting our staff. °DO NOT ACCEPT A "Partial Fill": If for any reason your pharmacy does not have enough pills/tablets to completely fill or refill your prescription, do not allow for a "partial fill". The law allows the pharmacy to complete that prescription within 72 hours, without requiring a new prescription. If they do not fill the rest of your prescription within those 72 hours, you will need a separate prescription to fill the remaining amount, which we will NOT provide. If the reason for the partial fill is your insurance, you will need to talk to the pharmacist about payment alternatives for the remaining tablets, but again, DO NOT ACCEPT A PARTIAL FILL, unless you can trust your pharmacist to obtain the remainder of the pills within 72 hours. ° °Prescription refills and/or changes in medication(s):  °Prescription refills, and/or changes in dose or medication, will be conducted only during scheduled medication management appointments. (Applies to both, written and electronic prescriptions.) °No refills on procedure days. No medication will be changed or started on procedure days. No changes, adjustments, and/or refills will be conducted on a procedure day. Doing so will interfere with the diagnostic portion of the procedure. °No phone refills. No medications will be "called into the pharmacy". °No Fax refills. °No weekend refills. °No Holliday refills. °No after hours refills. ° °Remember:  °Business hours are:  °Monday to Thursday 8:00 AM to 4:00 PM °Provider's Schedule: °Kaylena Pacifico, MD - Appointments are:  °Medication management: Monday and Wednesday 8:00 AM to 4:00 PM °Procedure day: Tuesday and Thursday 7:30 AM to 4:00 PM °Bilal Lateef, MD -  Appointments are:  °Medication management: Tuesday and Thursday 8:00   AM to 4:00 PM °Procedure day: Monday and Wednesday 7:30 AM to 4:00 PM °(Last update: 05/25/2020) °____________________________________________________________________________________________ ° ____________________________________________________________________________________________ ° °CBD (cannabidiol) & Delta-8 (Delta-8 tetrahydrocannabinol) WARNING ° °Intro: Cannabidiol (CBD) and tetrahydrocannabinol (THC), are two natural compounds found in plants of the Cannabis genus. They can both be extracted from hemp or cannabis. Hemp and cannabis come from the Cannabis sativa plant. Both compounds interact with your body’s endocannabinoid system, but they have very different effects. CBD does not produce the high sensation associated with cannabis. Delta-8 tetrahydrocannabinol, also known as delta-8 THC, is a psychoactive substance found in the Cannabis sativa plant, of which marijuana and hemp are two varieties. THC is responsible for the high associated with the illicit use of marijuana. ° °Applicable to: All individuals currently taking or considering taking CBD (cannabidiol) and, more important, all patients taking opioid analgesic controlled substances (pain medication). (Example: oxycodone; oxymorphone; hydrocodone; hydromorphone; morphine; methadone; tramadol; tapentadol; fentanyl; buprenorphine; butorphanol; dextromethorphan; meperidine; codeine; etc.) ° °Legal status: CBD remains a Schedule I drug prohibited for any use. CBD is illegal with one exception. In the [place], CBD has a limited Food and Drug Administration (FDA) approval for the treatment of two specific types of epilepsy disorders. Only one CBD product has been approved by the FDA for this purpose: "Epidiolex". FDA is aware that some companies are marketing products containing cannabis and cannabis-derived compounds in ways that violate the Federal Food, Drug and Cosmetic Act  (FD&C Act) and that may put the health and safety of consumers at risk. The FDA, a Federal agency, has not enforced the CBD status since 2018. UPDATE: (12/22/2021) The Drug Enforcement Agency (DEA) issued a letter stating that "delta" cannabinoids, including Delta-8-THCO and Delta-9-THCO, synthetically derived from hemp do not qualify as hemp and will be viewed as Schedule I drugs. (Schedule I drugs, substances, or chemicals are defined as drugs with no currently accepted medical use and a high potential for abuse. Some examples of Schedule I drugs are: heroin, lysergic acid diethylamide (LSD), marijuana (cannabis), 3,4-methylenedioxymethamphetamine (ecstasy), methaqualone, and peyote.) (https://www.dea.gov) ° °Legality: Some manufacturers ship CBD products nationally, which is illegal. Often such products are sold online and are therefore available throughout the country. CBD is openly sold in head shops and health food stores in some states where such sales have not been explicitly legalized. Selling unapproved products with unsubstantiated therapeutic claims is not only a violation of the law, but also can put patients at risk, as these products have not been proven to be safe or effective. Federal illegality makes it difficult to conduct research on CBD. ° °Reference: "FDA Regulation of Cannabis and Cannabis-Derived Products, Including Cannabidiol (CBD)" - https://www.fda.gov/news-events/public-health-focus/fda-regulation-cannabis-and-cannabis-derived-products-including-cannabidiol-cbd ° °Warning: CBD is not FDA approved and has not undergo the same manufacturing controls as prescription drugs.  This means that the purity and safety of available CBD may be questionable. Most of the time, despite manufacturer's claims, it is contaminated with THC (delta-9-tetrahydrocannabinol - the chemical in marijuana responsible for the "HIGH").  When this is the case, the THC contaminant will trigger a positive urine drug  screen (UDS) test for Marijuana (carboxy-THC). Because a positive UDS for any illicit substance is a violation of our medication agreement, your opioid analgesics (pain medicine) may be permanently discontinued. °The FDA recently put out a warning about 5 things that everyone should be aware of regarding Delta-8 THC: °Delta-8 THC products have not been evaluated or approved by the FDA for safe use and may be marketed in ways that put the   public health at risk. °The FDA has received adverse event reports involving delta-8 THC-containing products. °Delta-8 THC has psychoactive and intoxicating effects. °Delta-8 THC manufacturing often involve use of potentially harmful chemicals to create the concentrations of delta-8 THC claimed in the marketplace. The final delta-8 THC product may have potentially harmful by-products (contaminants) due to the chemicals used in the process. Manufacturing of delta-8 THC products may occur in uncontrolled or unsanitary settings, which may lead to the presence of unsafe contaminants or other potentially harmful substances. °Delta-8 THC products should be kept out of the reach of children and pets. ° °MORE ABOUT CBD ° °General Information: CBD was discovered in 1940 and it is a derivative of the cannabis sativa genus plants (Marijuana and Hemp). It is one of the 113 identified substances found in Marijuana. It accounts for up to 40% of the plant's extract. As of 2018, preliminary clinical studies on CBD included research for the treatment of anxiety, movement disorders, and pain. CBD is available and consumed in multiple forms, including inhalation of smoke or vapor, as an aerosol spray, and by mouth. It may be supplied as an oil containing CBD, capsules, dried cannabis, or as a liquid solution. CBD is thought not to be as psychoactive as THC (delta-9-tetrahydrocannabinol - the chemical in marijuana responsible for the "HIGH"). Studies suggest that CBD may interact with different  biological target receptors in the body, including cannabinoid and other neurotransmitter receptors. As of 2018 the mechanism of action for its biological effects has not been determined. ° °Side-effects   Adverse reactions: Dry mouth, diarrhea, decreased appetite, fatigue, drowsiness, malaise, weakness, sleep disturbances, and others. ° °Drug interactions: CBC may interact with other medications such as blood-thinners. Because CBD causes drowsiness on its own, it also increases the drowsiness caused by other medications, including antihistamines (such as Benadryl), benzodiazepines (Xanax, Ativan, Valium), antipsychotics, antidepressants and opioids, as well as alcohol and supplements such as kava, melatonin and St. John's Wort. Be cautious with the following combinations:  ° °Brivaracetam (Briviact) °Brivaracetam is changed and broken down by the body. CBD might decrease how quickly the body breaks down brivaracetam. This might increase levels of brivaracetam in the body. ° °Caffeine °Caffeine is changed and broken down by the body. CBD might decrease how quickly the body breaks down caffeine. This might increase levels of caffeine in the body. ° °Carbamazepine (Tegretol) °Carbamazepine is changed and broken down by the body. CBD might decrease how quickly the body breaks down carbamazepine. This might increase levels of carbamazepine in the body and increase its side effects. ° °Citalopram (Celexa) °Citalopram is changed and broken down by the body. CBD might decrease how quickly the body breaks down citalopram. This might increase levels of citalopram in the body and increase its side effects. ° °Clobazam (Onfi) °Clobazam is changed and broken down by the liver. CBD might decrease how quickly the liver breaks down clobazam. This might increase the effects and side effects of clobazam. ° °Eslicarbazepine (Aptiom) °Eslicarbazepine is changed and broken down by the body. CBD might decrease how quickly the body  breaks down eslicarbazepine. This might increase levels of eslicarbazepine in the body by a small amount. ° °Everolimus (Zostress) °Everolimus is changed and broken down by the body. CBD might decrease how quickly the body breaks down everolimus. This might increase levels of everolimus in the body. ° °Lithium °Taking higher doses of CBD might increase levels of lithium. This can increase the risk of lithium toxicity. ° °Medications changed by the   liver (Cytochrome P450 1A1 (CYP1A1) substrates) °Some medications are changed and broken down by the liver. CBD might change how quickly the liver breaks down these medications. This could change the effects and side effects of these medications. ° °Medications changed by the liver (Cytochrome P450 1A2 (CYP1A2) substrates) °Some medications are changed and broken down by the liver. CBD might change how quickly the liver breaks down these medications. This could change the effects and side effects of these medications. ° °Medications changed by the liver (Cytochrome P450 1B1 (CYP1B1) substrates) °Some medications are changed and broken down by the liver. CBD might change how quickly the liver breaks down these medications. This could change the effects and side effects of these medications. ° °Medications changed by the liver (Cytochrome P450 2A6 (CYP2A6) substrates) °Some medications are changed and broken down by the liver. CBD might change how quickly the liver breaks down these medications. This could change the effects and side effects of these medications. ° °Medications changed by the liver (Cytochrome P450 2B6 (CYP2B6) substrates) °Some medications are changed and broken down by the liver. CBD might change how quickly the liver breaks down these medications. This could change the effects and side effects of these medications. ° °Medications changed by the liver (Cytochrome P450 2C19 (CYP2C19) substrates) °Some medications are changed and broken down by the liver.  CBD might change how quickly the liver breaks down these medications. This could change the effects and side effects of these medications. ° °Medications changed by the liver (Cytochrome P450 2C8 (CYP2C8) substrates) °Some medications are changed and broken down by the liver. CBD might change how quickly the liver breaks down these medications. This could change the effects and side effects of these medications. ° °Medications changed by the liver (Cytochrome P450 2C9 (CYP2C9) substrates) °Some medications are changed and broken down by the liver. CBD might change how quickly the liver breaks down these medications. This could change the effects and side effects of these medications. ° °Medications changed by the liver (Cytochrome P450 2D6 (CYP2D6) substrates) °Some medications are changed and broken down by the liver. CBD might change how quickly the liver breaks down these medications. This could change the effects and side effects of these medications. ° °Medications changed by the liver (Cytochrome P450 2E1 (CYP2E1) substrates) °Some medications are changed and broken down by the liver. CBD might change how quickly the liver breaks down these medications. This could change the effects and side effects of these medications. ° °Medications changed by the liver (Cytochrome P450 3A4 (CYP3A4) substrates) °Some medications are changed and broken down by the liver. CBD might change how quickly the liver breaks down these medications. This could change the effects and side effects of these medications. ° °Medications changed by the liver (Glucuronidated drugs) °Some medications are changed and broken down by the liver. CBD might change how quickly the liver breaks down these medications. This could change the effects and side effects of these medications. ° °Medications that decrease the breakdown of other medications by the liver (Cytochrome P450 2C19 (CYP2C19) inhibitors) °CBD is changed and broken down by the liver.  Some drugs decrease how quickly the liver changes and breaks down CBD. This could change the effects and side effects of CBD. ° °Medications that decrease the breakdown of other medications in the liver (Cytochrome P450 3A4 (CYP3A4) inhibitors) °CBD is changed and broken down by the liver. Some drugs decrease how quickly the liver changes and breaks down CBD. This could change the   effects and side effects of CBD. ° °Medications that increase breakdown of other medications by the liver (Cytochrome P450 3A4 (CYP3A4) inducers) °CBD is changed and broken down by the liver. Some drugs increase how quickly the liver changes and breaks down CBD. This could change the effects and side effects of CBD. ° °Medications that increase the breakdown of other medications by the liver (Cytochrome P450 2C19 (CYP2C19) inducers) °CBD is changed and broken down by the liver. Some drugs increase how quickly the liver changes and breaks down CBD. This could change the effects and side effects of CBD. ° °Methadone (Dolophine) °Methadone is broken down by the liver. CBD might decrease how quickly the liver breaks down methadone. Taking cannabidiol along with methadone might increase the effects and side effects of methadone. ° °Rufinamide (Banzel) °Rufinamide is changed and broken down by the body. CBD might decrease how quickly the body breaks down rufinamide. This might increase levels of rufinamide in the body by a small amount. ° °Sedative medications (CNS depressants) °CBD might cause sleepiness and slowed breathing. Some medications, called sedatives, can also cause sleepiness and slowed breathing. Taking CBD with sedative medications might cause breathing problems and/or too much sleepiness. ° °Sirolimus (Rapamune) °Sirolimus is changed and broken down by the body. CBD might decrease how quickly the body breaks down sirolimus. This might increase levels of sirolimus in the body. ° °Stiripentol (Diacomit) °Stiripentol is changed and  broken down by the body. CBD might decrease how quickly the body breaks down stiripentol. This might increase levels of stiripentol in the body and increase its side effects. ° °Tacrolimus (Prograf) °Tacrolimus is changed and broken down by the body. CBD might decrease how quickly the body breaks down tacrolimus. This might increase levels of tacrolimus in the body. ° °Tamoxifen (Soltamox) °Tamoxifen is changed and broken down by the body. CBD might affect how quickly the body breaks down tamoxifen. This might affect levels of tamoxifen in the body. ° °Topiramate (Topamax) °Topiramate is changed and broken down by the body. CBD might decrease how quickly the body breaks down topiramate. This might increase levels of topiramate in the body by a small amount. ° °Valproate °Valproic acid can cause liver injury. Taking cannabidiol with valproic acid might increase the chance of liver injury. CBD and/or valproic acid might need to be stopped, or the dose might need to be reduced. ° °Warfarin (Coumadin) °CBD might increase levels of warfarin, which can increase the risk for bleeding. CBD and/or warfarin might need to be stopped, or the dose might need to be reduced. ° °Zonisamide °Zonisamide is changed and broken down by the body. CBD might decrease how quickly the body breaks down zonisamide. This might increase levels of zonisamide in the body by a small amount. °(Last update: 01/03/2022) °____________________________________________________________________________________________ ° ____________________________________________________________________________________________ ° °Drug Holidays (Slow) ° °What is a "Drug Holiday"? °Drug Holiday: is the name given to the period of time during which a patient stops taking a medication(s) for the purpose of eliminating tolerance to the drug. ° °Benefits °Improved effectiveness of opioids. °Decreased opioid dose needed to achieve benefits. °Improved pain with lesser  dose. ° °What is tolerance? °Tolerance: is the progressive decreased in effectiveness of a drug due to its repetitive use. With repetitive use, the body gets use to the medication and as a consequence, it loses its effectiveness. This is a common problem seen with opioid pain medications. As a result, a larger dose of the drug is needed to achieve the same effect   that used to be obtained with a smaller dose. ° °How long should a "Drug Holiday" last? °You should stay off of the pain medicine for at least 14 consecutive days. (2 weeks) ° °Should I stop the medicine "cold turkey"? °No. You should always coordinate with your Pain Specialist so that he/she can provide you with the correct medication dose to make the transition as smoothly as possible. ° °How do I stop the medicine? °Slowly. You will be instructed to decrease the daily amount of pills that you take by one (1) pill every seven (7) days. This is called a "slow downward taper" of your dose. For example: if you normally take four (4) pills per day, you will be asked to drop this dose to three (3) pills per day for seven (7) days, then to two (2) pills per day for seven (7) days, then to one (1) per day for seven (7) days, and at the end of those last seven (7) days, this is when the "Drug Holiday" would start.  ° °Will I have withdrawals? °By doing a "slow downward taper" like this one, it is unlikely that you will experience any significant withdrawal symptoms. Typically, what triggers withdrawals is the sudden stop of a high dose opioid therapy. Withdrawals can usually be avoided by slowly decreasing the dose over a prolonged period of time. If you do not follow these instructions and decide to stop your medication abruptly, withdrawals may be possible. ° °What are withdrawals? °Withdrawals: refers to the wide range of symptoms that occur after stopping or dramatically reducing opiate drugs after heavy and prolonged use. Withdrawal symptoms do not occur to  patients that use low dose opioids, or those who take the medication sporadically. Contrary to benzodiazepine (example: Valium, Xanax, etc.) or alcohol withdrawals (“Delirium Tremens”), opioid withdrawals are not lethal. Withdrawals are the physical manifestation of the body getting rid of the excess receptors. ° °Expected Symptoms °Early symptoms of withdrawal may include: °Agitation °Anxiety °Muscle aches °Increased tearing °Insomnia °Runny nose °Sweating °Yawning ° °Late symptoms of withdrawal may include: °Abdominal cramping °Diarrhea °Dilated pupils °Goose bumps °Nausea °Vomiting ° °Will I experience withdrawals? °Due to the slow nature of the taper, it is very unlikely that you will experience any. ° °What is a slow taper? °Taper: refers to the gradual decrease in dose.  °(Last update: 05/25/2020) °____________________________________________________________________________________________ ° °  °

## 2022-03-08 NOTE — Progress Notes (Signed)
PROVIDER NOTE: Information contained herein reflects review and annotations entered in association with encounter. Interpretation of such information and data should be left to medically-trained personnel. Information provided to patient can be located elsewhere in the medical record under "Patient Instructions". Document created using STT-dictation technology, any transcriptional errors that may result from process are unintentional.  ?  ?Patient: James [name]  Service Category: E/M  Provider: Gaspar Cola, MD  ?DOB: 09/18/1956  DOS: 03/12/2022  Specialty: Interventional Pain Management  ?MRN: [id number]  Setting: Ambulatory outpatient  PCP: Sheffield Slider, MD  ?Type: Established Patient    Referring Provider: Sheffield Slider, MD  ?Location: Office  Delivery: Face-to-face    ? ?HPI  ?James [name], a 66 y.o. year old male, is here today because of his Chronic pain syndrome [G89.4]. James [name]'s primary complain today is Back Pain (Upper to lower ) and Arm Pain (Right ) ?Last encounter: My last encounter with him was on 01/15/2022. 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); Epidural fibrosis; Chronic musculoskeletal pain; Neurogenic pain; Neuropathic pain; Chronic sacroiliac  joint pain (Bilateral) (R>L); Psoriatic arthritis ([place]); Chronic lower extremity pain (2ry area of Pain) (Bilateral) (L>R); Psoriasis with arthropathy ([place]); Numbness and tingling of both legs; Abnormal nerve conduction studies (06/20/16); Chronic pain syndrome; Perineal pain; Chest pain with high risk for cardiac etiology; Chronic upper extremity pain(R>L); Cervicalgia; DDD (degenerative disc disease), cervical; Closed fracture of lumbar vertebral body ([place]) (L2); Neurogenic bladder s/p L2 fracture (04/19/2020); Lumbosacral radiculopathy at L5 (Right); Compression fracture of L2 lumbar vertebra, sequela; Abnormal MRI, cervical spine (09/19/2018); Trigger finger, ring finger (Left); and Occipital neuralgia (midline) on their pertinent problem list. ?Pain Assessment: Severity of Chronic pain is reported as a 6 /10. Location: Back Mid, Lower, Medial, Left, Right/ . Onset:  . Quality:  . Timing:  . Modifying factor(s):  . ?Vitals:  height is 6' (1.829 m) and weight is 260 lb 12.8 oz (118.3 kg). His temporal temperature is 97.5 ?F (36.4 ?C) (abnormal). His blood pressure is 134/88 and his pulse is 100. His respiration is 16 and oxygen saturation is 98%.  ? ?Reason for encounter: medication management.   The patient indicates doing well with the current medication regimen. No adverse reactions or side effects reported to the medications.  The patient indicates that on 01/23/2022 he had back surgery to decompress what appears to have been an L5 radiculopathy on the right side.  He refers having had a dropfoot which has been improving since the surgery. ? ?UDS ordered today.  ? ?RTCB: 06/22/2022 ?Nonopioids transferred 08/31/2020: Zanaflex ? ?Pharmacotherapy Assessment  ?Analgesic: Oxycodone IR 20 mg, 1 tab PO q 8 hrs (60 mg/day of oxycodone) ?MME/day: 120 mg/day.  ? ?Monitoring: ?[place] PMP: PDMP reviewed during this encounter.       ?Pharmacotherapy: No side-effects or adverse reactions reported. ?Compliance: No problems  identified. ?Effectiveness: Clinically acceptable. ? ?Janett Billow, RN  03/12/2022  2:59  PM  Sign when Signing Visit ?Nursing Pain Medication Assessment:  ?Safety precautions to be maintained throughout the outpatient stay will include: orient to surroundings, keep bed in low position, maintain call bell within reach at all times, provide assistance with transfer out of bed and ambulation.  ?Medication Inspection Compliance: Pill count conducted under aseptic conditions, in front of the patient. Neither the pills nor the bottle was removed from the patient's sight at any time. Once count was completed pills were immediately returned to the patient in their original bottle. ? ?Medication: Oxycodone IR ?Pill/Patch Count:  39 of 90 pills remain ?Pill/Patch Appearance: Markings consistent with prescribed medication ?Bottle Appearance: Standard pharmacy container. Clearly labeled. ?Filled Date: 04 / 20 / 2023 ?Last Medication intake:  Today ?   UDS:  ?Summary  ?Date Value Ref Range Status  ?03/22/2021 Note  Final  ?  Comment:  ?  ==================================================================== ?[employer] Select 13 (MW) ?==================================================================== ?Test                             Result       Flag       Units ? ?Drug Present and Declared for Prescription Verification ?  Oxycodone                      1249         EXPECTED   ng/mg creat ?  Oxymorphone                    832          EXPECTED   ng/mg creat ?  Noroxycodone                   5312         EXPECTED   ng/mg creat ?  Noroxymorphone                 285          EXPECTED   ng/mg creat ?   Sources of oxycodone are scheduled prescription medications. ?   Oxymorphone, noroxycodone, and noroxymorphone are expected ?   metabolites of oxycodone. Oxymorphone is also available as a ?   scheduled prescription medication. ? ?==================================================================== ?Test                       Result    Flag   Units      Ref Range ?  Creatinine              179              mg/dL      >=20 ?==================================================================== ?Declared Medications: ? The flagging and interpretation on this report are based on the ? following declared medications.  Unexpected results may arise from ? inaccuracies in the declared medications. ? ? **Note: The testing scope of this panel includes these medications: ? ? Oxycodone ? ? **Note: The testing scope of this panel does not include the ? following reported medications: ? ? Albuterol (Ventolin HFA) ? Loratadine (Claritin) ? Metoprolol ? Multivitamin ? Naloxone (Narcan) ? Tizanidine (Zanaflex) ? Vitamin B ?==================================================================== ?For clinical consultation, please call [phone]. ?==================================================================== ?  ?  ? ?ROS  ?Constitutional: Denies any fever or chills ?Gastrointestinal: No reported hemesis, hematochezia, vomiting, or acute GI distress ?Musculoskeletal: Denies any acute onset joint swelling, redness, loss of ROM, or weakness ?Neurological:  No reported episodes of acute onset apraxia, aphasia, dysarthria, agnosia, amnesia, paralysis, loss of coordination, or loss of consciousness ? ?Medication Review  ?Cholecalciferol, Ergocalciferol, Oxycodone HCl, albuterol, b complex vitamins, levothyroxine, loratadine, metoprolol tartrate, multivitamin, naloxone, and tiZANidine ? ?History Review  ?Allergy: James [name] is allergic to penicillins, lactose, nsaids, talwin [pentazocine], codeine, procaine, and sulfa antibiotics. ?Drug: James [name]  reports no history of drug use. ?Alcohol:  reports no history of alcohol use. ?Tobacco:  reports that he has quit smoking. His smoking use included cigarettes. His smokeless tobacco use includes chew. ?Social: James [name]  reports that he has quit smoking. His smoking use included cigarettes. His smokeless  tobacco use includes chew. He reports that he does not drink alcohol and does not use drugs. ?Medical:  has a past medical history of Allergy, Anemia, Arthritis, Asthma, Blood transfusion without reported diagnos

## 2022-03-12 ENCOUNTER — Other Ambulatory Visit: Payer: Self-pay

## 2022-03-12 ENCOUNTER — Ambulatory Visit: Payer: Medicare Other | Attending: Pain Medicine | Admitting: Pain Medicine

## 2022-03-12 ENCOUNTER — Encounter: Payer: Self-pay | Admitting: Pain Medicine

## 2022-03-12 VITALS — BP 134/88 | HR 100 | Temp 97.5°F | Resp 16 | Ht 72.0 in | Wt 260.8 lb

## 2022-03-12 DIAGNOSIS — M5412 Radiculopathy, cervical region: Secondary | ICD-10-CM | POA: Diagnosis present

## 2022-03-12 DIAGNOSIS — M961 Postlaminectomy syndrome, not elsewhere classified: Secondary | ICD-10-CM | POA: Diagnosis present

## 2022-03-12 DIAGNOSIS — M79605 Pain in left leg: Secondary | ICD-10-CM | POA: Diagnosis present

## 2022-03-12 DIAGNOSIS — M25562 Pain in left knee: Secondary | ICD-10-CM | POA: Insufficient documentation

## 2022-03-12 DIAGNOSIS — Z79899 Other long term (current) drug therapy: Secondary | ICD-10-CM | POA: Diagnosis present

## 2022-03-12 DIAGNOSIS — M542 Cervicalgia: Secondary | ICD-10-CM | POA: Diagnosis present

## 2022-03-12 DIAGNOSIS — G8929 Other chronic pain: Secondary | ICD-10-CM | POA: Diagnosis present

## 2022-03-12 DIAGNOSIS — M545 Low back pain, unspecified: Secondary | ICD-10-CM | POA: Diagnosis present

## 2022-03-12 DIAGNOSIS — M79604 Pain in right leg: Secondary | ICD-10-CM | POA: Insufficient documentation

## 2022-03-12 DIAGNOSIS — Z79891 Long term (current) use of opiate analgesic: Secondary | ICD-10-CM | POA: Insufficient documentation

## 2022-03-12 DIAGNOSIS — M25561 Pain in right knee: Secondary | ICD-10-CM | POA: Diagnosis present

## 2022-03-12 DIAGNOSIS — M47812 Spondylosis without myelopathy or radiculopathy, cervical region: Secondary | ICD-10-CM | POA: Insufficient documentation

## 2022-03-12 DIAGNOSIS — G894 Chronic pain syndrome: Secondary | ICD-10-CM | POA: Insufficient documentation

## 2022-03-12 MED ORDER — OXYCODONE HCL 20 MG PO TABS: 20.0000 mg | ORAL_TABLET | Freq: Three times a day (TID) | ORAL | 0 refills | Status: DC | PRN

## 2022-03-12 NOTE — Progress Notes (Signed)
Nursing Pain Medication Assessment:  ?Safety precautions to be maintained throughout the outpatient stay will include: orient to surroundings, keep bed in low position, maintain call bell within reach at all times, provide assistance with transfer out of bed and ambulation.  ?Medication Inspection Compliance: Pill count conducted under aseptic conditions, in front of the patient. Neither the pills nor the bottle was removed from the patient's sight at any time. Once count was completed pills were immediately returned to the patient in their original bottle. ? ?Medication: Oxycodone IR ?Pill/Patch Count:  39 of 90 pills remain ?Pill/Patch Appearance: Markings consistent with prescribed medication ?Bottle Appearance: Standard pharmacy container. Clearly labeled. ?Filled Date: 04 / 20 / 2023 ?Last Medication intake:  Today ?

## 2022-03-12 NOTE — Patient Instructions (Addendum)
____________________________________________________________________________________________ ? ?Medication Rules ? ?Purpose: To inform patients, and their family members, of our rules and regulations. ? ?Applies to: All patients receiving prescriptions (written or electronic). ? ?Pharmacy of record: Pharmacy where electronic prescriptions will be sent. If written prescriptions are taken to a different pharmacy, please inform the nursing staff. The pharmacy listed in the electronic medical record should be the one where you would like electronic prescriptions to be sent. ? ?Electronic prescriptions: In compliance with the [place] Strengthen Opioid Misuse Prevention (STOP) Act of 2017 (Session Law 2017-74/H243), effective November 05, 2018, all controlled substances must be electronically prescribed. Calling prescriptions to the pharmacy will cease to exist. ? ?Prescription refills: Only during scheduled appointments. Applies to all prescriptions. ? ?NOTE: The following applies primarily to controlled substances (Opioid* Pain Medications).  ? ?Type of encounter (visit): For patients receiving controlled substances, face-to-face visits are required. (Not an option or up to the patient.) ? ?Patient's responsibilities: ?Pain Pills: Bring all pain pills to every appointment (except for procedure appointments). ?Pill Bottles: Bring pills in original pharmacy bottle. Always bring the newest bottle. Bring bottle, even if empty. ?Medication refills: You are responsible for knowing and keeping track of what medications you take and those you need refilled. ?The day before your appointment: write a list of all prescriptions that need to be refilled. ?The day of the appointment: give the list to the admitting nurse. Prescriptions will be written only during appointments. No prescriptions will be written on procedure days. ?If you forget a medication: it will not be "Called in", "Faxed", or "electronically sent". You will  need to get another appointment to get these prescribed. ?No early refills. Do not call asking to have your prescription filled early. ?Prescription Accuracy: You are responsible for carefully inspecting your prescriptions before leaving our office. Have the discharge nurse carefully go over each prescription with you, before taking them home. Make sure that your name is accurately spelled, that your address is correct. Check the name and dose of your medication to make sure it is accurate. Check the number of pills, and the written instructions to make sure they are clear and accurate. Make sure that you are given enough medication to last until your next medication refill appointment. ?Taking Medication: Take medication as prescribed. When it comes to controlled substances, taking less pills or less frequently than prescribed is permitted and encouraged. ?Never take more pills than instructed. ?Never take medication more frequently than prescribed.  ?Inform other Doctors: Always inform, all of your healthcare providers, of all the medications you take. ?Pain Medication from other Providers: You are not allowed to accept any additional pain medication from any other Doctor or Healthcare provider. There are two exceptions to this rule. (see below) In the event that you require additional pain medication, you are responsible for notifying us, as stated below. ?Cough Medicine: Often these contain an opioid, such as codeine or hydrocodone. Never accept or take cough medicine containing these opioids if you are already taking an opioid* medication. The combination may cause respiratory failure and death. ?Medication Agreement: You are responsible for carefully reading and following our Medication Agreement. This must be signed before receiving any prescriptions from our practice. Safely store a copy of your signed Agreement. Violations to the Agreement will result in no further prescriptions. (Additional copies of our  Medication Agreement are available upon request.) ?Laws, Rules, & Regulations: All patients are expected to follow all Federal and State Laws, Statutes, Rules, & Regulations. Ignorance of   the Laws does not constitute a valid excuse.  ?Illegal drugs and Controlled Substances: The use of illegal substances (including, but not limited to marijuana and its derivatives) and/or the illegal use of any controlled substances is strictly prohibited. Violation of this rule may result in the immediate and permanent discontinuation of any and all prescriptions being written by our practice. The use of any illegal substances is prohibited. ?Adopted CDC guidelines & recommendations: Target dosing levels will be at or below 60 MME/day. Use of benzodiazepines** is not recommended. ? ?Exceptions: There are only two exceptions to the rule of not receiving pain medications from other Healthcare Providers. ?Exception #1 (Emergencies): In the event of an emergency (i.e.: accident requiring emergency care), you are allowed to receive additional pain medication. However, you are responsible for: As soon as you are able, call our office [phone], at any time of the day or night, and leave a message stating your name, the date and nature of the emergency, and the name and dose of the medication prescribed. In the event that your call is answered by a member of our staff, make sure to document and save the date, time, and the name of the person that took your information.  ?Exception #2 (Planned Surgery): In the event that you are scheduled by another doctor or dentist to have any type of surgery or procedure, you are allowed (for a period no longer than 30 days), to receive additional pain medication, for the acute post-op pain. However, in this case, you are responsible for picking up a copy of our "Post-op Pain Management for Surgeons" handout, and giving it to your surgeon or dentist. This document is available at our office, and  does not require an appointment to obtain it. Simply go to our office during business hours (Monday-Thursday from 8:00 AM to 4:00 PM) (Friday 8:00 AM to 12:00 Noon) or if you have a scheduled appointment with us, prior to your surgery, and ask for it by name. In addition, you are responsible for: calling our office [phone], at any time of the day or night, and leaving a message stating your name, name of your surgeon, type of surgery, and date of procedure or surgery. Failure to comply with your responsibilities may result in termination of therapy involving the controlled substances. ?Medication Agreement Violation. Following the above rules, including your responsibilities will help you in avoiding a Medication Agreement Violation (?Breaking your Pain Medication Contract?). ? ?*Opioid medications include: morphine, codeine, oxycodone, oxymorphone, hydrocodone, hydromorphone, meperidine, tramadol, tapentadol, buprenorphine, fentanyl, methadone. ?**Benzodiazepine medications include: diazepam (Valium), alprazolam (Xanax), clonazepam (Klonopine), lorazepam (Ativan), clorazepate (Tranxene), chlordiazepoxide (Librium), estazolam (Prosom), oxazepam (Serax), temazepam (Restoril), triazolam (Halcion) ?(Last updated: 08/02/2021) ?____________________________________________________________________________________________ ? ____________________________________________________________________________________________ ? ?Medication Recommendations and Reminders ? ?Applies to: All patients receiving prescriptions (written and/or electronic). ? ?Medication Rules & Regulations: These rules and regulations exist for your safety and that of others. They are not flexible and neither are we. Dismissing or ignoring them will be considered "non-compliance" with medication therapy, resulting in complete and irreversible termination of such therapy. (See document titled "Medication Rules" for more details.) In all conscience,  because of safety reasons, we cannot continue providing a therapy where the patient does not follow instructions. ? ?Pharmacy of record:  ?Definition: This is the pharmacy where your electronic prescriptions w

## 2022-03-13 ENCOUNTER — Other Ambulatory Visit: Payer: Self-pay | Admitting: Pain Medicine

## 2022-03-20 LAB — TOXASSURE SELECT 13 (MW), URINE

## 2022-06-10 NOTE — Progress Notes (Signed)
PROVIDER NOTE: Information contained herein reflects review and annotations entered in association with encounter. Interpretation of such information and data should be left to medically-trained personnel. Information provided to patient can be located elsewhere in the medical record under "Patient Instructions". Document created using STT-dictation technology, any transcriptional errors that may result from process are unintentional.    Patient: James [name]  Service Category: E/M  Provider: Gaspar Cola, MD  DOB: 03/18/1956  DOS: 06/11/2022  Referring Provider: Sheffield Slider, MD  MRN: [id number]  Specialty: Interventional Pain Management  PCP: Sheffield Slider, MD  Type: Established Patient  Setting: Ambulatory outpatient    Location: Office  Delivery: Face-to-face     HPI  [name], a 66 y.o. year old male, is here today because of his Chronic pain syndrome [G89.4]. Mr. Ahart primary complain today is Back Pain (lower) Last encounter: My last encounter with him was on 03/12/2022. Pertinent problems: Mr. Mickelson has Narrowing of intervertebral disc space; Chronic low back pain (1ry area of Pain) (Bilateral) (R>L); Lumbar spondylosis; Chronic neck pain (Right); Cervical spondylosis (C7 intravertebral body cyst); Chronic cervical radicular pain (Right); Chronic lumbar radicular pain (Bilateral) (L>R) (L5 Dermatome); Osteoarthritis; Chronic hip pain; Chronic knee pain (Bilateral) (R>L); Complex regional pain syndrome type II of upper extremity (Right); Chronic upper extremity pain (Right); 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); Epidural fibrosis; Chronic musculoskeletal pain; Neurogenic pain; Neuropathic pain; Chronic sacroiliac joint pain (Bilateral) (R>L);  Psoriatic arthritis ([place]); Chronic lower extremity pain (2ry area of Pain) (Bilateral) (L>R); Psoriasis with arthropathy ([place]); Numbness and tingling of both legs; Abnormal nerve conduction studies (06/20/16); Chronic pain syndrome; Perineal pain; Chest pain with high risk for cardiac etiology; Chronic upper extremity pain(R>L); Cervicalgia; DDD (degenerative disc disease), cervical; Closed fracture of lumbar vertebral body ([place]) (L2); Neurogenic bladder s/p L2 fracture (04/19/2020); Lumbosacral radiculopathy at L5 (Right); Compression fracture of L2 lumbar vertebra, sequela; Abnormal MRI, cervical spine (09/19/2018); Trigger finger, ring finger (Left); Occipital neuralgia (midline); and Trigger finger, ring finger (Right) on their pertinent problem list. Pain Assessment: Severity of Chronic pain is reported as a 6 /10. Location: Back Lower/pain radiaties down his arms and legs to his fingers and toes. Onset: More than a month ago. Quality: Tingling, Throbbing, Aching, Nagging, Shooting, Stabbing, Spasm. Timing: Constant. Modifying factor(s): Meds and laying down. Vitals:  height is 6' (1.829 m) and weight is 270 lb (122.5 kg). His temperature is 97.2 F (36.2 C) (abnormal). His blood pressure is 134/72 and his pulse is 72. His oxygen saturation is 100%.   Reason for encounter: medication management.  The patient indicates doing well with the current medication regimen. No adverse reactions or side effects reported to the medications.   RTCB: 09/20/2022 Nonopioids transferred 08/31/2020: Zanaflex  Pharmacotherapy Assessment  Analgesic: Oxycodone IR 20 mg, 1 tab PO q 8 hrs (60 mg/day of oxycodone) MME/day: 120 mg/day.   Monitoring: [place] PMP: PDMP reviewed during this encounter.       Pharmacotherapy: No side-effects or adverse reactions reported. Compliance: No problems identified. Effectiveness: Clinically acceptable.  Chauncey Fischer, RN  06/11/2022 11:07 AM  Sign when Signing Visit Nursing Pain  Medication Assessment:  Safety precautions to be maintained throughout the outpatient stay will include: orient to surroundings, keep bed in low position, maintain call bell within reach at all times, provide assistance  with transfer out of bed and ambulation.  Medication Inspection Compliance: Pill count conducted under aseptic conditions, in front of the patient. Neither the pills nor the bottle was removed from the patient's sight at any time. Once count was completed pills were immediately returned to the patient in their original bottle.  Medication: Oxycodone IR Pill/Patch Count:  40 of 89 pills remain Pill/Patch Appearance: Markings consistent with prescribed medication Bottle Appearance: Standard pharmacy container. Clearly labeled. Filled Date: 7 / 22 / 2023 Last Medication intake:  TodaySafety precautions to be maintained throughout the outpatient stay will include: orient to surroundings, keep bed in low position, maintain call bell within reach at all times, provide assistance with transfer out of bed and ambulation.     UDS:  Summary  Date Value Ref Range Status  03/13/2022 Note  Final    Comment:    ==================================================================== [employer] [facility] (MW) ==================================================================== Test                             Result       Flag       Units  Drug Present and Declared for Prescription Verification   Oxycodone                      3181         EXPECTED   ng/mg creat   Oxymorphone                    >3367        EXPECTED   ng/mg creat   Noroxycodone                   >3367        EXPECTED   ng/mg creat   Noroxymorphone                 929          EXPECTED   ng/mg creat    Sources of oxycodone are scheduled prescription medications.    Oxymorphone, noroxycodone, and noroxymorphone are expected    metabolites of oxycodone. Oxymorphone is also available as a    scheduled prescription  medication.  ==================================================================== Test                      Result    Flag   Units      Ref Range   Creatinine              297              mg/dL      >=20 ==================================================================== Declared Medications:  The flagging and interpretation on this report are based on the  following declared medications.  Unexpected results may arise from  inaccuracies in the declared medications.   **Note: The testing scope of this panel includes these medications:   Oxycodone   **Note: The testing scope of this panel does not include the  following reported medications:   Albuterol (Ventolin HFA)  Cholecalciferol  Levothyroxine (Synthroid)  Loratadine (Claritin)  Metoprolol (Lopressor)  Multivitamin  Naloxone (Narcan)  Tizanidine (Zanaflex)  Vitamin B  Vitamin D2 (Ergocalciferol) ==================================================================== For clinical consultation, please call [phone]. ====================================================================      ROS  Constitutional: Denies any fever or chills Gastrointestinal: No reported hemesis, hematochezia, vomiting, or acute GI distress Musculoskeletal: Denies any acute onset joint swelling, redness, loss of ROM,  or weakness Neurological: No reported episodes of acute onset apraxia, aphasia, dysarthria, agnosia, amnesia, paralysis, loss of coordination, or loss of consciousness  Medication Review  Cholecalciferol, Ergocalciferol, Oxycodone HCl, albuterol, b complex vitamins, levothyroxine, loratadine, metoprolol tartrate, multivitamin, naloxone, and tiZANidine  History Review  Allergy: Mr. Recupero is allergic to penicillins, lactose, nsaids, talwin [pentazocine], codeine, procaine, and sulfa antibiotics. Drug: Mr. Hedglin  reports no history of drug use. Alcohol:  reports no history of alcohol use. Tobacco:  reports that he has  quit smoking. His smoking use included cigarettes. His smokeless tobacco use includes chew. Social: Mr. Philbrook  reports that he has quit smoking. His smoking use included cigarettes. His smokeless tobacco use includes chew. He reports that he does not drink alcohol and does not use drugs. Medical:  has a past medical history of Allergy, Anemia, Arthritis, Asthma, Blood transfusion without reported diagnosis, Bronchitis, Bursitis, Cancer ([place]), Cataract, 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09/13/2015), Emphysema of lung ([place]), Gastritis, GERD (gastroesophageal reflux disease), Heart murmur, Hepatitis C, Hiatal hernia, Hypertension, Hypothyroidism, Kidney stone, Lupus ([place]), Obesity, Osteoporosis, Peripheral nerve disease, Psoriatic arthritis ([place]), Sleep apnea, Supraventricular tachycardia ([place]), Tendonitis, and Thyroid disease. Surgical: Mr. Mcmanaway  has a past surgical history that includes Spine surgery; Eye surgery; Fracture surgery (Right); Fracture surgery (Bilateral); Fracture surgery (Bilateral); Joint replacement (Right); Spinal cord stimulator implant; Spinal cord stimulator removal; Patella arthroplasty; Shoulder arthroscopy (Bilateral); Esophagogastroduodenoscopy (egd) with propofol (N/A, 01/20/2016); and Colonoscopy (2009). Family: family history includes Arthritis in his father and mother; COPD in his father; Cancer in his father and mother; Diabetes in his mother; Hematuria in his father and mother; Hypertension in his father and mother; Kidney disease in his mother; Kidney failure in his mother; Prostate cancer in his father; Stroke in his mother.  Laboratory Chemistry Profile   Renal Lab Results  Component Value Date   BUN 15 08/19/2019   CREATININE 1.47 (H) 08/19/2019   GFRAA 59 (L) 08/19/2019   GFRNONAA 51 (L) 08/19/2019    Hepatic Lab Results  Component Value Date   AST 25 08/19/2019   ALT 24 08/19/2019   ALBUMIN 4.2 08/19/2019   ALKPHOS 47  08/19/2019    Electrolytes Lab Results  Component Value Date   NA 138 08/19/2019   K 4.1 08/19/2019   CL 100 08/19/2019   CALCIUM 9.3 08/19/2019   MG 2.1 08/19/2019    Bone Lab Results  Component Value Date   VD25OH 26.19 (L) 08/19/2019   25OHVITD1 42 12/06/2016   25OHVITD2 <1.0 12/06/2016   25OHVITD3 42 12/06/2016   TESTOSTERONE 285 02/12/2018    Inflammation (CRP: Acute Phase) (ESR: Chronic Phase) Lab Results  Component Value Date   CRP <0.8 08/19/2019   ESRSEDRATE 1 08/19/2019         Note: Above Lab results reviewed.  Recent Imaging Review  MR CERVICAL SPINE WO CONTRAST CLINICAL DATA:  Neck pain worse on the right with radiculopathy. Numbness in the arms and hands with worsening symptoms in the past 6 months.  EXAM: MRI CERVICAL SPINE WITHOUT CONTRAST  TECHNIQUE: Multiplanar, multisequence MR imaging of the cervical spine was performed. No intravenous contrast was administered.  COMPARISON:  12/13/2016  FINDINGS: Alignment: Chronic cervical spine straightening. Unchanged minimal anterolisthesis of C3 on C4.  Vertebrae: No fracture or suspicious osseous lesion. Chronic degenerative endplate changes at C3-7 with decreased degenerative endplate edema.  Cord: Normal signal.  Posterior Fossa, vertebral arteries, paraspinal tissues: Unremarkable.  Disc levels:  C2-3: Mild right and  moderate to severe left facet arthrosis without stenosis, unchanged.  C3-4: Central disc protrusion, uncovertebral spurring, and severe right and moderate left facet arthrosis result in borderline spinal stenosis with slight ventral cord flattening and moderate right neural foraminal stenosis, unchanged.  C4-5: Minimal disc bulging, mild uncovertebral spurring, and moderate right facet arthrosis without stenosis, unchanged.  C5-6: Moderate disc space narrowing. Disc bulging, uncovertebral spurring, and moderate right and mild left facet arthrosis result in mild spinal  stenosis and moderate right greater than left neural foraminal stenosis, unchanged.  C6-7: Severe disc space narrowing. Broad-based posterior disc osteophyte complex results in mild spinal stenosis and mild-to-moderate right and borderline left neural foraminal stenosis, unchanged.  C7-T1: Minimal disc bulging and moderate to severe right and mild left facet arthrosis result in mild right neural foraminal stenosis without spinal stenosis, unchanged.  IMPRESSION: 1. Decreased degenerative endplate edema at R3-2. 2. Otherwise unchanged appearance of the cervical spine with up to mild spinal and moderate neural foraminal stenosis at multiple levels as above.  Electronically Signed   By: Logan Bores M.D.   On: 09/19/2018 10:30 Note: Reviewed        Physical Exam  General appearance: Well nourished, well developed, and well hydrated. In no apparent acute distress Mental status: Alert, oriented x 3 (person, place, & time)       Respiratory: No evidence of acute respiratory distress Eyes: PERLA Vitals: BP 134/72   Pulse 72   Temp (!) 97.2 F (36.2 C)   Ht 6' (1.829 m)   Wt 270 lb (122.5 kg)   SpO2 100%   BMI 36.62 kg/m  BMI: Estimated body mass index is 36.62 kg/m as calculated from the following:   Height as of this encounter: 6' (1.829 m).   Weight as of this encounter: 270 lb (122.5 kg). Ideal: Ideal body weight: 77.6 kg (171 lb 1.2 oz) Adjusted ideal body weight: 95.5 kg (210 lb 10.3 oz)  Assessment   Diagnosis Status  1. Chronic pain syndrome   2. Chronic low back pain (1ry area of Pain) (Bilateral) (R>L)   3. Chronic lower extremity pain (2ry area of Pain) (Bilateral) (L>R)   4. Cervicalgia (Right)   5. Cervical spondylosis (C7 intravertebral body cyst)   6. Chronic cervical radicular pain (Right)   7. Chronic knee pain (Bilateral) (R>L)   8. 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 Pharmacologic therapy   10. Chronic use of opiate for  therapeutic purpose   11. Encounter for medication management   12. Encounter for chronic pain management   13. Trigger finger, ring finger (Right)    Controlled Controlled Controlled   Updated Problems: Problem  Trigger finger, ring finger (Right)    Plan of Care  Problem-specific:  No problem-specific Assessment & Plan notes found for this encounter.  Mr. JC VERON has a current medication list which includes the following long-term medication(s): albuterol, naloxone, [START ON 06/22/2022] oxycodone hcl, [START ON 07/22/2022] oxycodone hcl, [START ON 08/21/2022] oxycodone hcl, and tizanidine.  Pharmacotherapy (Medications Ordered): Meds ordered this encounter  Medications   Oxycodone HCl 20 MG TABS    Sig: Take 1 tablet (20 mg total) by mouth every 8 (eight) hours as needed. Must last 30 days    Dispense:  90 tablet    Refill:  0    DO NOT: delete (not duplicate); no partial-fill (will deny script to complete), no refill request (F/U required). DISPENSE: 1 day early if closed  on fill date. WARN: No CNS-depressants within 8 hrs of med.   Oxycodone HCl 20 MG TABS    Sig: Take 1 tablet (20 mg total) by mouth every 8 (eight) hours as needed. Must last 30 days    Dispense:  90 tablet    Refill:  0    DO NOT: delete (not duplicate); no partial-fill (will deny script to complete), no refill request (F/U required). DISPENSE: 1 day early if closed on fill date. WARN: No CNS-depressants within 8 hrs of med.   Oxycodone HCl 20 MG TABS    Sig: Take 1 tablet (20 mg total) by mouth every 8 (eight) hours as needed. Must last 30 days    Dispense:  90 tablet    Refill:  0    DO NOT: delete (not duplicate); no partial-fill (will deny script to complete), no refill request (F/U required). DISPENSE: 1 day early if closed on fill date. WARN: No CNS-depressants within 8 hrs of med.   Orders:  Orders Placed This Encounter  Procedures   Injection tendon or ligament    Standing Status:    Future    Standing Expiration Date:   09/11/2022    Scheduling Instructions:     Type of Block:  Trigger Finger injection     Side: Right-sided     Sedation: Patient's choice.     Timeframe: ASAA   Follow-up plan:   Return in about 3 months (around 09/20/2022) for Eval-day (M,W), (F2F), (MM) + (R) Middle trigger finger inj. #1 (ASAA).     Interventional Therapies  Risk  Complexity Considerations:   Estimated body mass index is 38.65 kg/m as calculated from the following:   Height as of 09/20/21: 6' (1.829 m).   Weight as of 09/20/21: 285 lb (129.3 kg). WNL   Planned  Pending:   Pending lumbar spine surgery on 01/23/2022.   Under consideration:      Completed:   Therapeutic right L4-5 LESI x1 (06/26/2016) (50/50/25)  Therapeutic bilateral lumbar facet MBB x2 (04/15/2017) (90/90/50/50)  Therapeutic left lumbar facet RFA x1 (07/18/17) (90/90/90/90)    Therapeutic  Palliative (PRN) options:   Palliative right CESI  Palliative right L4-5 LESI #2   Palliative bilateral lumbar facet MBB  Palliative left lumbar facet RFA #2  Palliative bilateral cervical facet MBB      Recent Visits No visits were found meeting these conditions. Showing recent visits within past 90 days and meeting all other requirements Today's Visits Date Type Provider Dept  06/11/22 Office Visit Milinda Pointer, MD [facility]  Showing today's visits and meeting all other requirements Future Appointments No visits were found meeting these conditions. Showing future appointments within next 90 days and meeting all other requirements  I discussed the assessment and treatment plan with the patient. The patient was provided an opportunity to ask questions and all were answered. The patient agreed with the plan and demonstrated an understanding of the instructions.  Patient advised to call back or seek an in-person evaluation if the symptoms or condition worsens.  Duration of encounter: 30  minutes.  Total time on encounter, as per AMA guidelines included both the face-to-face and non-face-to-face time personally spent by the physician and/or other qualified health care professional(s) on the day of the encounter (includes time in activities that require the physician or other qualified health care professional and does not include time in activities normally performed by clinical staff). Physician's time may include the following activities when performed: preparing  to see the patient (eg, review of tests, pre-charting review of records) obtaining and/or reviewing separately obtained history performing a medically appropriate examination and/or evaluation counseling and educating the patient/family/caregiver ordering medications, tests, or procedures referring and communicating with other health care professionals (when not separately reported) documenting clinical information in the electronic or other health record independently interpreting results (not separately reported) and communicating results to the patient/ family/caregiver care coordination (not separately reported)  Note by: Gaspar Cola, MD Date: 06/11/2022; Time: 12:00 PM

## 2022-06-11 ENCOUNTER — Encounter: Payer: Self-pay | Admitting: Pain Medicine

## 2022-06-11 ENCOUNTER — Ambulatory Visit: Payer: Medicare Other | Attending: Pain Medicine | Admitting: Pain Medicine

## 2022-06-11 VITALS — BP 134/72 | HR 72 | Temp 97.2°F | Ht 72.0 in | Wt 270.0 lb

## 2022-06-11 DIAGNOSIS — M545 Low back pain, unspecified: Secondary | ICD-10-CM | POA: Insufficient documentation

## 2022-06-11 DIAGNOSIS — Z79899 Other long term (current) drug therapy: Secondary | ICD-10-CM | POA: Diagnosis present

## 2022-06-11 DIAGNOSIS — M5412 Radiculopathy, cervical region: Secondary | ICD-10-CM | POA: Diagnosis present

## 2022-06-11 DIAGNOSIS — Z79891 Long term (current) use of opiate analgesic: Secondary | ICD-10-CM | POA: Insufficient documentation

## 2022-06-11 DIAGNOSIS — M542 Cervicalgia: Secondary | ICD-10-CM | POA: Diagnosis present

## 2022-06-11 DIAGNOSIS — M961 Postlaminectomy syndrome, not elsewhere classified: Secondary | ICD-10-CM | POA: Insufficient documentation

## 2022-06-11 DIAGNOSIS — M47812 Spondylosis without myelopathy or radiculopathy, cervical region: Secondary | ICD-10-CM | POA: Insufficient documentation

## 2022-06-11 DIAGNOSIS — G8929 Other chronic pain: Secondary | ICD-10-CM | POA: Insufficient documentation

## 2022-06-11 DIAGNOSIS — M25561 Pain in right knee: Secondary | ICD-10-CM | POA: Diagnosis present

## 2022-06-11 DIAGNOSIS — M65341 Trigger finger, right ring finger: Secondary | ICD-10-CM | POA: Insufficient documentation

## 2022-06-11 DIAGNOSIS — M79604 Pain in right leg: Secondary | ICD-10-CM | POA: Diagnosis present

## 2022-06-11 DIAGNOSIS — M25562 Pain in left knee: Secondary | ICD-10-CM | POA: Insufficient documentation

## 2022-06-11 DIAGNOSIS — M79605 Pain in left leg: Secondary | ICD-10-CM | POA: Insufficient documentation

## 2022-06-11 DIAGNOSIS — G894 Chronic pain syndrome: Secondary | ICD-10-CM | POA: Insufficient documentation

## 2022-06-11 MED ORDER — OXYCODONE HCL 20 MG PO TABS: 20.0000 mg | ORAL_TABLET | Freq: Three times a day (TID) | ORAL | 0 refills | Status: DC | PRN

## 2022-06-11 NOTE — Patient Instructions (Addendum)
____________________________________________________________________________________________  Pharmacy Shortages of Pain Medication   Introduction Shockingly as it may seem, .  "No U.[place] Supreme Court decision has ever interpreted the Constitution as guaranteeing a right to health care for all Americans." - https://www.healthequityandpolicylab.com/elusive-right-to-health-care-under-us-law  "With respect to human rights, the [place] has no formally codified right to health, nor does it participate in a human rights treaty that specifies a right to health." - Scott J. Schweikart, JD, MBE  Situation By now, most of our patients have had the experience of being told by their pharmacist that they do not have enough medication to cover their prescription. If you have not had this experience, just know that you soon will.  Problem There appears to be a shortage of these medications, either at the national level or locally. This is happening with all pharmacies. When there is not enough medication, patients are offered a partial fill and they are told that they will try to get the rest of the medicine for them at a later time. If they do not have enough for even a partial fill, the pharmacists are telling the patients to call us (the prescribing physicians) to request that we send another prescription to another pharmacy to get the medicine.   This reordering of a controlled substance creates documentation problems where additional paperwork needs to be created to explain why two prescriptions for the same period of time and the same medicine are being prescribed to the same patient. It also creates situations where the last appointment note does not accurately reflect when and what prescriptions were given to a patient. This leads to prescribing errors down the line, in subsequent follow-up visits.   [place] Board [employer] Pharmacy ([place]) Research revealed that Board of Pharmacy Rule .1806 (21  [place]AC 46.1806) authorizes pharmacists to the transfer of prescriptions among pharmacies, and it sets forth procedural and recordkeeping requirements for doing so. However, this requires the pharmacist to complete the previously mentioned procedural paperwork to accomplish the transfer. As it turns out, it is much easier for them to have the prescribing physicians do the work.   Possible solutions 1. Have the [place] Assembly add a provision to the "STOP ACT" (the law that mandates how controlled substances are prescribed) where there is an exception to the electronic prescribing rule that states that in the event there are shortages of medications the physicians are allowed to use written prescriptions as opposed to electronic ones. This would allow patients to take their prescriptions to a different pharmacy that may have enough medication available to fill the prescription. The problem is that currently there is a law that does not allow for written prescriptions, with the exception of instances where the electronic medical record is down due to technical issues.  2. Have [place] Congress ease the pressure on pharmaceutical companies, allowing them to produce enough quantities of the medication to adequately supply the population. 3. Have pharmacies keep enough stocks of these medications to cover their client base.  4. Have the [place] State Assembly add a provision to the "STOP ACT" where they ease the regulations surrounding the transfer of controlled substances between pharmacies, so as to simplify the transfer of supplies. As an alternative, develop a system to allow patients to obtain the remainder of their prescription at another one of their pharmacies or at an associate pharmacy.   How this shortage will affect you.  The one thing that is abundantly clear is that this is a pharmacy supply   problem  and not a prescriber problem. The job of the prescriber is to evaluate and monitor the  patients for the appropriate indications to the use of these medicines, the monitoring of their use and the prescribing of the appropriate dose and regimen. It is not the job of the prescriber to provide or dispense the actual medication. By law, this is the job of the pharmacies and pharmacists. It is certainly not the job of the prescriber to solve the supply problems.   Due to the above problems we are no longer taking patients to write for their pain medication. We will continue to evaluate for appropriate indications and we may provide recommendations regarding medication, dose, and schedule, as well as monitoring recommendations, however, we will not be taking over the actual prescribing of these substances. On those patients where we are treating their chronic pain with interventional therapies, exceptions will be considered on a case by case basis. At this time, we will try to continue providing this supplemental service to those patients we have been managing in the past. However, as of August 1st, 2023, we no longer will be sending additional prescriptions to other pharmacies for the purpose of solving their supply problems. Once we send a prescription to a pharmacy, we will not be resending it again to another pharmacy to cover for their shortages.   What to do. Write as many letters as you can. Recruit the help of family members in writing these letters. Below are some of the places where you can write to make your voice heard. Let them know what the problem is and push them to look for solutions.   Search internet for: "[place] find your legislators" https://www.ncleg.gov/findyourlegislators  Search internet for: "[place] insurance commissioner complaints" https://www.ncdoi.gov/contactscomplaints/assistance-or-file-complaint  Search internet for: "[place] Board of Pharmacy complaints" http://www.ncbop.org/contact.htm  Search internet for: "[facility] pharmacy  complaints" Email [facility] Customer Relations https://www.[facility].com/help/email-customer-relations.jsp?callType=store  Search internet for: "[facility] pharmacy customer service complaints" https://www.[facility].com/topic/marketing/contactus/contactus_customerservice.jsp  ____________________________________________________________________________________________  ____________________________________________________________________________________________  Medication Rules  Purpose: To inform patients, and their family members, of our rules and regulations.  Applies to: All patients receiving prescriptions (written or electronic).  Pharmacy of record: Pharmacy where electronic prescriptions will be sent. If written prescriptions are taken to a different pharmacy, please inform the nursing staff. The pharmacy listed in the electronic medical record should be the one where you would like electronic prescriptions to be sent.  Electronic prescriptions: In compliance with the [place] Strengthen Opioid Misuse Prevention (STOP) Act of 2017 (Session Law 2017-74/H243), effective November 05, 2018, all controlled substances must be electronically prescribed. Calling prescriptions to the pharmacy will cease to exist.  Prescription refills: Only during scheduled appointments. Applies to all prescriptions.  NOTE: The following applies primarily to controlled substances (Opioid* Pain Medications).   Type of encounter (visit): For patients receiving controlled substances, face-to-face visits are required. (Not an option or up to the patient.)  Patient's responsibilities: Pain Pills: Bring all pain pills to every appointment (except for procedure appointments). Pill Bottles: Bring pills in original pharmacy bottle. Always bring the newest bottle. Bring bottle, even if empty. Medication refills: You are responsible for knowing and keeping track of what medications you take and those you need  refilled. The day before your appointment: write a list of all prescriptions that need to be refilled. The day of the appointment: give the list to the admitting nurse. Prescriptions will be written only during appointments. No prescriptions will be written on procedure days.   If you forget a medication: it will not be "Called in", "Faxed", or "electronically sent". You will need to get another appointment to get these prescribed. No early refills. Do not call asking to have your prescription filled early. Prescription Accuracy: You are responsible for carefully inspecting your prescriptions before leaving our office. Have the discharge nurse carefully go over each prescription with you, before taking them home. Make sure that your name is accurately spelled, that your address is correct. Check the name and dose of your medication to make sure it is accurate. Check the number of pills, and the written instructions to make sure they are clear and accurate. Make sure that you are given enough medication to last until your next medication refill appointment. Taking Medication: Take medication as prescribed. When it comes to controlled substances, taking less pills or less frequently than prescribed is permitted and encouraged. Never take more pills than instructed. Never take medication more frequently than prescribed.  Inform other Doctors: Always inform, all of your healthcare providers, of all the medications you take. Pain Medication from other Providers: You are not allowed to accept any additional pain medication from any other Doctor or Healthcare provider. There are two exceptions to this rule. (see below) In the event that you require additional pain medication, you are responsible for notifying us, as stated below. Cough Medicine: Often these contain an opioid, such as codeine or hydrocodone. Never accept or take cough medicine containing these opioids if you are already taking an opioid*  medication. The combination may cause respiratory failure and death. Medication Agreement: You are responsible for carefully reading and following our Medication Agreement. This must be signed before receiving any prescriptions from our practice. Safely store a copy of your signed Agreement. Violations to the Agreement will result in no further prescriptions. (Additional copies of our Medication Agreement are available upon request.) Laws, Rules, & Regulations: All patients are expected to follow all Federal and State Laws, Statutes, Rules, & Regulations. Ignorance of the Laws does not constitute a valid excuse.  Illegal drugs and Controlled Substances: The use of illegal substances (including, but not limited to marijuana and its derivatives) and/or the illegal use of any controlled substances is strictly prohibited. Violation of this rule may result in the immediate and permanent discontinuation of any and all prescriptions being written by our practice. The use of any illegal substances is prohibited. Adopted CDC guidelines & recommendations: Target dosing levels will be at or below 60 MME/day. Use of benzodiazepines** is not recommended.  Exceptions: There are only two exceptions to the rule of not receiving pain medications from other Healthcare Providers. Exception #1 (Emergencies): In the event of an emergency (i.e.: accident requiring emergency care), you are allowed to receive additional pain medication. However, you are responsible for: As soon as you are able, call our office [phone], at any time of the day or night, and leave a message stating your name, the date and nature of the emergency, and the name and dose of the medication prescribed. In the event that your call is answered by a member of our staff, make sure to document and save the date, time, and the name of the person that took your information.  Exception #2 (Planned Surgery): In the event that you are scheduled by another  doctor or dentist to have any type of surgery or procedure, you are allowed (for a period no longer than 30 days), to receive additional pain medication, for the acute post-op pain.   However, in this case, you are responsible for picking up a copy of our "Post-op Pain Management for Surgeons" handout, and giving it to your surgeon or dentist. This document is available at our office, and does not require an appointment to obtain it. Simply go to our office during business hours (Monday-Thursday from 8:00 AM to 4:00 PM) (Friday 8:00 AM to 12:00 Noon) or if you have a scheduled appointment with us, prior to your surgery, and ask for it by name. In addition, you are responsible for: calling our office [phone], at any time of the day or night, and leaving a message stating your name, name of your surgeon, type of surgery, and date of procedure or surgery. Failure to comply with your responsibilities may result in termination of therapy involving the controlled substances. Medication Agreement Violation. Following the above rules, including your responsibilities will help you in avoiding a Medication Agreement Violation ("Breaking your Pain Medication Contract").  *Opioid medications include: morphine, codeine, oxycodone, oxymorphone, hydrocodone, hydromorphone, meperidine, tramadol, tapentadol, buprenorphine, fentanyl, methadone. **Benzodiazepine medications include: diazepam (Valium), alprazolam (Xanax), clonazepam (Klonopine), lorazepam (Ativan), clorazepate (Tranxene), chlordiazepoxide (Librium), estazolam (Prosom), oxazepam (Serax), temazepam (Restoril), triazolam (Halcion) (Last updated: 08/02/2021) ____________________________________________________________________________________________  ____________________________________________________________________________________________  Medication Recommendations and Reminders  Applies to: All patients receiving prescriptions (written and/or  electronic).  Medication Rules & Regulations: These rules and regulations exist for your safety and that of others. They are not flexible and neither are we. Dismissing or ignoring them will be considered "non-compliance" with medication therapy, resulting in complete and irreversible termination of such therapy. (See document titled "Medication Rules" for more details.) In all conscience, because of safety reasons, we cannot continue providing a therapy where the patient does not follow instructions.  Pharmacy of record:  Definition: This is the pharmacy where your electronic prescriptions will be sent.  We do not endorse any particular pharmacy, however, we have experienced problems with [facility] not securing enough medication supply for the community. We do not restrict you in your choice of pharmacy. However, once we write for your prescriptions, we will NOT be re-sending more prescriptions to fix restricted supply problems created by your pharmacy, or your insurance.  The pharmacy listed in the electronic medical record should be the one where you want electronic prescriptions to be sent. If you choose to change pharmacy, simply notify our nursing staff.  Recommendations: Keep all of your pain medications in a safe place, under lock and key, even if you live alone. We will NOT replace lost, stolen, or damaged medication. After you fill your prescription, take 1 week's worth of pills and put them away in a safe place. You should keep a separate, properly labeled bottle for this purpose. The remainder should be kept in the original bottle. Use this as your primary supply, until it runs out. Once it's gone, then you know that you have 1 week's worth of medicine, and it is time to come in for a prescription refill. If you do this correctly, it is unlikely that you will ever run out of medicine. To make sure that the above recommendation works, it is very important that you make sure your medication  refill appointments are scheduled at least 1 week before you run out of medicine. To do this in an effective manner, make sure that you do not leave the office without scheduling your next medication management appointment. Always ask the nursing staff to show you in your prescription , when your medication will be running out.   Then arrange for the receptionist to get you a return appointment, at least 7 days before you run out of medicine. Do not wait until you have 1 or 2 pills left, to come in. This is very poor planning and does not take into consideration that we may need to cancel appointments due to bad weather, sickness, or emergencies affecting our staff. DO NOT ACCEPT A "Partial Fill": If for any reason your pharmacy does not have enough pills/tablets to completely fill or refill your prescription, do not allow for a "partial fill". The law allows the pharmacy to complete that prescription within 72 hours, without requiring a new prescription. If they do not fill the rest of your prescription within those 72 hours, you will need a separate prescription to fill the remaining amount, which we will NOT provide. If the reason for the partial fill is your insurance, you will need to talk to the pharmacist about payment alternatives for the remaining tablets, but again, DO NOT ACCEPT A PARTIAL FILL, unless you can trust your pharmacist to obtain the remainder of the pills within 72 hours.  Prescription refills and/or changes in medication(s):  Prescription refills, and/or changes in dose or medication, will be conducted only during scheduled medication management appointments. (Applies to both, written and electronic prescriptions.) No refills on procedure days. No medication will be changed or started on procedure days. No changes, adjustments, and/or refills will be conducted on a procedure day. Doing so will interfere with the diagnostic portion of the procedure. No phone refills. No medications will be  "called into the pharmacy". No Fax refills. No weekend refills. No Holliday refills. No after hours refills.  Remember:  Business hours are:  Monday to Thursday 8:00 AM to 4:00 PM Provider's Schedule: Yidel Teuscher, MD - Appointments are:  Medication management: Monday and Wednesday 8:00 AM to 4:00 PM Procedure day: Tuesday and Thursday 7:30 AM to 4:00 PM Bilal Lateef, MD - Appointments are:  Medication management: Tuesday and Thursday 8:00 AM to 4:00 PM Procedure day: Monday and Wednesday 7:30 AM to 4:00 PM (Last update: 05/25/2020) ____________________________________________________________________________________________  ____________________________________________________________________________________________  CBD (cannabidiol) & Delta-8 (Delta-8 tetrahydrocannabinol) WARNING  Intro: Cannabidiol (CBD) and tetrahydrocannabinol (THC), are two natural compounds found in plants of the Cannabis genus. They can both be extracted from hemp or cannabis. Hemp and cannabis come from the Cannabis sativa plant. Both compounds interact with your body's endocannabinoid system, but they have very different effects. CBD does not produce the high sensation associated with cannabis. Delta-8 tetrahydrocannabinol, also known as delta-8 THC, is a psychoactive substance found in the Cannabis sativa plant, of which marijuana and hemp are two varieties. THC is responsible for the high associated with the illicit use of marijuana.  Applicable to: All individuals currently taking or considering taking CBD (cannabidiol) and, more important, all patients taking opioid analgesic controlled substances (pain medication). (Example: oxycodone; oxymorphone; hydrocodone; hydromorphone; morphine; methadone; tramadol; tapentadol; fentanyl; buprenorphine; butorphanol; dextromethorphan; meperidine; codeine; etc.)  Legal status: CBD remains a Schedule I drug prohibited for any use. CBD is illegal with one exception.  In the [place], CBD has a limited Food and Drug Administration (FDA) approval for the treatment of two specific types of epilepsy disorders. Only one CBD product has been approved by the FDA for this purpose: "Epidiolex". FDA is aware that some companies are marketing products containing cannabis and cannabis-derived compounds in ways that violate the Federal Food, Drug and Cosmetic Act (FD&C Act) and that may put the health and   safety of consumers at risk. The FDA, a Federal agency, has not enforced the CBD status since 2018. UPDATE: (12/22/2021) The Drug Enforcement Agency (DEA) issued a letter stating that "delta" cannabinoids, including Delta-8-THCO and Delta-9-THCO, synthetically derived from hemp do not qualify as hemp and will be viewed as Schedule I drugs. (Schedule I drugs, substances, or chemicals are defined as drugs with no currently accepted medical use and a high potential for abuse. Some examples of Schedule I drugs are: heroin, lysergic acid diethylamide (LSD), marijuana (cannabis), 3,4-methylenedioxymethamphetamine (ecstasy), methaqualone, and peyote.) (https://www.dea.gov)  Legality: Some manufacturers ship CBD products nationally, which is illegal. Often such products are sold online and are therefore available throughout the country. CBD is openly sold in head shops and health food stores in some states where such sales have not been explicitly legalized. Selling unapproved products with unsubstantiated therapeutic claims is not only a violation of the law, but also can put patients at risk, as these products have not been proven to be safe or effective. Federal illegality makes it difficult to conduct research on CBD.  Reference: "FDA Regulation of Cannabis and Cannabis-Derived Products, Including Cannabidiol (CBD)" - https://www.fda.gov/news-events/public-health-focus/fda-regulation-cannabis-and-cannabis-derived-products-including-cannabidiol-cbd  Warning: CBD is not FDA approved  and has not undergo the same manufacturing controls as prescription drugs.  This means that the purity and safety of available CBD may be questionable. Most of the time, despite manufacturer's claims, it is contaminated with THC (delta-9-tetrahydrocannabinol - the chemical in marijuana responsible for the "HIGH").  When this is the case, the THC contaminant will trigger a positive urine drug screen (UDS) test for Marijuana (carboxy-THC). Because a positive UDS for any illicit substance is a violation of our medication agreement, your opioid analgesics (pain medicine) may be permanently discontinued. The FDA recently put out a warning about 5 things that everyone should be aware of regarding Delta-8 THC: Delta-8 THC products have not been evaluated or approved by the FDA for safe use and may be marketed in ways that put the public health at risk. The FDA has received adverse event reports involving delta-8 THC-containing products. Delta-8 THC has psychoactive and intoxicating effects. Delta-8 THC manufacturing often involve use of potentially harmful chemicals to create the concentrations of delta-8 THC claimed in the marketplace. The final delta-8 THC product may have potentially harmful by-products (contaminants) due to the chemicals used in the process. Manufacturing of delta-8 THC products may occur in uncontrolled or unsanitary settings, which may lead to the presence of unsafe contaminants or other potentially harmful substances. Delta-8 THC products should be kept out of the reach of children and pets.  MORE ABOUT CBD  General Information: CBD was discovered in 1940 and it is a derivative of the cannabis sativa genus plants (Marijuana and Hemp). It is one of the 113 identified substances found in Marijuana. It accounts for up to 40% of the plant's extract. As of 2018, preliminary clinical studies on CBD included research for the treatment of anxiety, movement disorders, and pain. CBD is available and  consumed in multiple forms, including inhalation of smoke or vapor, as an aerosol spray, and by mouth. It may be supplied as an oil containing CBD, capsules, dried cannabis, or as a liquid solution. CBD is thought not to be as psychoactive as THC (delta-9-tetrahydrocannabinol - the chemical in marijuana responsible for the "HIGH"). Studies suggest that CBD may interact with different biological target receptors in the body, including cannabinoid and other neurotransmitter receptors. As of 2018 the mechanism of action for its biological effects   has not been determined.  Side-effects  Adverse reactions: Dry mouth, diarrhea, decreased appetite, fatigue, drowsiness, malaise, weakness, sleep disturbances, and others.  Drug interactions: CBC may interact with other medications such as blood-thinners. Because CBD causes drowsiness on its own, it also increases the drowsiness caused by other medications, including antihistamines (such as Benadryl), benzodiazepines (Xanax, Ativan, Valium), antipsychotics, antidepressants and opioids, as well as alcohol and supplements such as kava, melatonin and St. John's Wort. Be cautious with the following combinations:   Brivaracetam (Briviact) Brivaracetam is changed and broken down by the body. CBD might decrease how quickly the body breaks down brivaracetam. This might increase levels of brivaracetam in the body.  Caffeine Caffeine is changed and broken down by the body. CBD might decrease how quickly the body breaks down caffeine. This might increase levels of caffeine in the body.  Carbamazepine (Tegretol) Carbamazepine is changed and broken down by the body. CBD might decrease how quickly the body breaks down carbamazepine. This might increase levels of carbamazepine in the body and increase its side effects.  Citalopram (Celexa) Citalopram is changed and broken down by the body. CBD might decrease how quickly the body breaks down citalopram. This might increase  levels of citalopram in the body and increase its side effects.  Clobazam (Onfi) Clobazam is changed and broken down by the liver. CBD might decrease how quickly the liver breaks down clobazam. This might increase the effects and side effects of clobazam.  Eslicarbazepine (Aptiom) Eslicarbazepine is changed and broken down by the body. CBD might decrease how quickly the body breaks down eslicarbazepine. This might increase levels of eslicarbazepine in the body by a small amount.  Everolimus (Zostress) Everolimus is changed and broken down by the body. CBD might decrease how quickly the body breaks down everolimus. This might increase levels of everolimus in the body.  Lithium Taking higher doses of CBD might increase levels of lithium. This can increase the risk of lithium toxicity.  Medications changed by the liver (Cytochrome P450 1A1 (CYP1A1) substrates) Some medications are changed and broken down by the liver. CBD might change how quickly the liver breaks down these medications. This could change the effects and side effects of these medications.  Medications changed by the liver (Cytochrome P450 1A2 (CYP1A2) substrates) Some medications are changed and broken down by the liver. CBD might change how quickly the liver breaks down these medications. This could change the effects and side effects of these medications.  Medications changed by the liver (Cytochrome P450 1B1 (CYP1B1) substrates) Some medications are changed and broken down by the liver. CBD might change how quickly the liver breaks down these medications. This could change the effects and side effects of these medications.  Medications changed by the liver (Cytochrome P450 2A6 (CYP2A6) substrates) Some medications are changed and broken down by the liver. CBD might change how quickly the liver breaks down these medications. This could change the effects and side effects of these medications.  Medications changed by the liver  (Cytochrome P450 2B6 (CYP2B6) substrates) Some medications are changed and broken down by the liver. CBD might change how quickly the liver breaks down these medications. This could change the effects and side effects of these medications.  Medications changed by the liver (Cytochrome P450 2C19 (CYP2C19) substrates) Some medications are changed and broken down by the liver. CBD might change how quickly the liver breaks down these medications. This could change the effects and side effects of these medications.  Medications changed by the   liver (Cytochrome P450 2C8 (CYP2C8) substrates) Some medications are changed and broken down by the liver. CBD might change how quickly the liver breaks down these medications. This could change the effects and side effects of these medications.  Medications changed by the liver (Cytochrome P450 2C9 (CYP2C9) substrates) Some medications are changed and broken down by the liver. CBD might change how quickly the liver breaks down these medications. This could change the effects and side effects of these medications.  Medications changed by the liver (Cytochrome P450 2D6 (CYP2D6) substrates) Some medications are changed and broken down by the liver. CBD might change how quickly the liver breaks down these medications. This could change the effects and side effects of these medications.  Medications changed by the liver (Cytochrome P450 2E1 (CYP2E1) substrates) Some medications are changed and broken down by the liver. CBD might change how quickly the liver breaks down these medications. This could change the effects and side effects of these medications.  Medications changed by the liver (Cytochrome P450 3A4 (CYP3A4) substrates) Some medications are changed and broken down by the liver. CBD might change how quickly the liver breaks down these medications. This could change the effects and side effects of these medications.  Medications changed by the liver  (Glucuronidated drugs) Some medications are changed and broken down by the liver. CBD might change how quickly the liver breaks down these medications. This could change the effects and side effects of these medications.  Medications that decrease the breakdown of other medications by the liver (Cytochrome P450 2C19 (CYP2C19) inhibitors) CBD is changed and broken down by the liver. Some drugs decrease how quickly the liver changes and breaks down CBD. This could change the effects and side effects of CBD.  Medications that decrease the breakdown of other medications in the liver (Cytochrome P450 3A4 (CYP3A4) inhibitors) CBD is changed and broken down by the liver. Some drugs decrease how quickly the liver changes and breaks down CBD. This could change the effects and side effects of CBD.  Medications that increase breakdown of other medications by the liver (Cytochrome P450 3A4 (CYP3A4) inducers) CBD is changed and broken down by the liver. Some drugs increase how quickly the liver changes and breaks down CBD. This could change the effects and side effects of CBD.  Medications that increase the breakdown of other medications by the liver (Cytochrome P450 2C19 (CYP2C19) inducers) CBD is changed and broken down by the liver. Some drugs increase how quickly the liver changes and breaks down CBD. This could change the effects and side effects of CBD.  Methadone (Dolophine) Methadone is broken down by the liver. CBD might decrease how quickly the liver breaks down methadone. Taking cannabidiol along with methadone might increase the effects and side effects of methadone.  Rufinamide (Banzel) Rufinamide is changed and broken down by the body. CBD might decrease how quickly the body breaks down rufinamide. This might increase levels of rufinamide in the body by a small amount.  Sedative medications (CNS depressants) CBD might cause sleepiness and slowed breathing. Some medications, called sedatives,  can also cause sleepiness and slowed breathing. Taking CBD with sedative medications might cause breathing problems and/or too much sleepiness.  Sirolimus (Rapamune) Sirolimus is changed and broken down by the body. CBD might decrease how quickly the body breaks down sirolimus. This might increase levels of sirolimus in the body.  Stiripentol (Diacomit) Stiripentol is changed and broken down by the body. CBD might decrease how quickly the body breaks down   stiripentol. This might increase levels of stiripentol in the body and increase its side effects.  Tacrolimus (Prograf) Tacrolimus is changed and broken down by the body. CBD might decrease how quickly the body breaks down tacrolimus. This might increase levels of tacrolimus in the body.  Tamoxifen (Soltamox) Tamoxifen is changed and broken down by the body. CBD might affect how quickly the body breaks down tamoxifen. This might affect levels of tamoxifen in the body.  Topiramate (Topamax) Topiramate is changed and broken down by the body. CBD might decrease how quickly the body breaks down topiramate. This might increase levels of topiramate in the body by a small amount.  Valproate Valproic acid can cause liver injury. Taking cannabidiol with valproic acid might increase the chance of liver injury. CBD and/or valproic acid might need to be stopped, or the dose might need to be reduced.  Warfarin (Coumadin) CBD might increase levels of warfarin, which can increase the risk for bleeding. CBD and/or warfarin might need to be stopped, or the dose might need to be reduced.  Zonisamide Zonisamide is changed and broken down by the body. CBD might decrease how quickly the body breaks down zonisamide. This might increase levels of zonisamide in the body by a small amount. (Last update: 01/03/2022) ____________________________________________________________________________________________   ____________________________________________________________________________________________  Drug Holidays (Slow)  What is a "Drug Holiday"? Drug Holiday: is the name given to the period of time during which a patient stops taking a medication(s) for the purpose of eliminating tolerance to the drug.  Benefits Improved effectiveness of opioids. Decreased opioid dose needed to achieve benefits. Improved pain with lesser dose.  What is tolerance? Tolerance: is the progressive decreased in effectiveness of a drug due to its repetitive use. With repetitive use, the body gets use to the medication and as a consequence, it loses its effectiveness. This is a common problem seen with opioid pain medications. As a result, a larger dose of the drug is needed to achieve the same effect that used to be obtained with a smaller dose.  How long should a "Drug Holiday" last? You should stay off of the pain medicine for at least 14 consecutive days. (2 weeks)  Should I stop the medicine "cold turkey"? No. You should always coordinate with your Pain Specialist so that he/she can provide you with the correct medication dose to make the transition as smoothly as possible.  How do I stop the medicine? Slowly. You will be instructed to decrease the daily amount of pills that you take by one (1) pill every seven (7) days. This is called a "slow downward taper" of your dose. For example: if you normally take four (4) pills per day, you will be asked to drop this dose to three (3) pills per day for seven (7) days, then to two (2) pills per day for seven (7) days, then to one (1) per day for seven (7) days, and at the end of those last seven (7) days, this is when the "Drug Holiday" would start.   Will I have withdrawals? By doing a "slow downward taper" like this one, it is unlikely that you will experience any significant withdrawal symptoms. Typically, what triggers withdrawals is the sudden stop of a high dose  opioid therapy. Withdrawals can usually be avoided by slowly decreasing the dose over a prolonged period of time. If you do not follow these instructions and decide to stop your medication abruptly, withdrawals may be possible.  What are withdrawals? Withdrawals: refers to   the wide range of symptoms that occur after stopping or dramatically reducing opiate drugs after heavy and prolonged use. Withdrawal symptoms do not occur to patients that use low dose opioids, or those who take the medication sporadically. Contrary to benzodiazepine (example: Valium, Xanax, etc.) or alcohol withdrawals ("Delirium Tremens"), opioid withdrawals are not lethal. Withdrawals are the physical manifestation of the body getting rid of the excess receptors.  Expected Symptoms Early symptoms of withdrawal may include: Agitation Anxiety Muscle aches Increased tearing Insomnia Runny nose Sweating Yawning  Late symptoms of withdrawal may include: Abdominal cramping Diarrhea Dilated pupils Goose bumps Nausea Vomiting  Will I experience withdrawals? Due to the slow nature of the taper, it is very unlikely that you will experience any.  What is a slow taper? Taper: refers to the gradual decrease in dose.  (Last update: 05/25/2020) ____________________________________________________________________________________________   ______________________________________________________________________  Preparing for your procedure (without sedation)  Procedure appointments are limited to planned procedures: No Prescription Refills. No disability issues will be discussed. No medication changes will be discussed.  Instructions: Food Intake: Avoid eating anything for at least 4 hours prior to your procedure. Transportation: Unless otherwise stated by your physician, bring a driver. Morning Medicines: Take all of your scheduled morning medications. If you take heart medicine, except for blood thinners, do not  forget to take it the morning of the procedure. If your Diastolic (lower reading) is above 100 mmHg, elective cases will be cancelled/rescheduled. Blood thinners: These will need to be stopped for procedures. Notify our staff if you are taking any blood thinners. Depending on which one you take, there will be specific instructions on how and when to stop it. Diabetics on insulin: Notify the staff so that you can be scheduled 1st case in the morning. If your diabetes requires high dose insulin, take only  of your normal insulin dose the morning of the procedure and notify the staff that you have done so. Preventing infections: Shower with an antibacterial soap the morning of your procedure.  Build-up your immune system: Take 1000 mg of Vitamin C with every meal (3 times a day) the day prior to your procedure. Antibiotics: Inform the staff if you have a condition or reason that requires you to take antibiotics before dental procedures. Pregnancy: If you are pregnant, call and cancel the procedure. Sickness: If you have a cold, fever, or any active infections, call and cancel the procedure. Arrival: You must be in the facility at least 30 minutes prior to your scheduled procedure. Children: Do not bring any children with you. Dress appropriately: There is always a possibility that your clothing may get soiled. Valuables: Do not bring any jewelry or valuables.  Reasons to call and reschedule or cancel your procedure: (Following these recommendations will minimize the risk of a serious complication.) Surgeries: Avoid having procedures within 2 weeks of any surgery. (Avoid for 2 weeks before or after any surgery). Flu Shots: Avoid having procedures within 2 weeks of a flu shots or . (Avoid for 2 weeks before or after immunizations). Barium: Avoid having a procedure within 7-10 days after having had a radiological study involving the use of radiological contrast. (Myelograms, Barium swallow or enema  study). Heart attacks: Avoid any elective procedures or surgeries for the initial 6 months after a "Myocardial Infarction" (Heart Attack). Blood thinners: It is imperative that you stop these medications before procedures. Let us know if you if you take any blood thinner.  Infection: Avoid procedures during or within two weeks  of an infection (including chest colds or gastrointestinal problems). Symptoms associated with infections include: Localized redness, fever, chills, night sweats or profuse sweating, burning sensation when voiding, cough, congestion, stuffiness, runny nose, sore throat, diarrhea, nausea, vomiting, cold or Flu symptoms, recent or current infections. It is specially important if the infection is over the area that we intend to treat. Heart and lung problems: Symptoms that may suggest an active cardiopulmonary problem include: cough, chest pain, breathing difficulties or shortness of breath, dizziness, ankle swelling, uncontrolled high or unusually low blood pressure, and/or palpitations. If you are experiencing any of these symptoms, cancel your procedure and contact your primary care physician for an evaluation.  Remember:  Regular Business hours are:  Monday to Thursday 8:00 AM to 4:00 PM  Provider's Schedule: Milinda Pointer, MD:  Procedure days: Tuesday and Thursday 7:30 AM to 4:00 PM  Gillis Santa, MD:  Procedure days: Monday and Wednesday 7:30 AM to 4:00 PM ______________________________________________________________________  ____________________________________________________________________________________________  General Risks and Possible Complications  Patient Responsibilities: It is important that you read this as it is part of your informed consent. It is our duty to inform you of the risks and possible complications associated with treatments offered to you. It is your responsibility as a patient to read this and to ask questions about anything that is  not clear or that you believe was not covered in this document.  Patient's Rights: You have the right to refuse treatment. You also have the right to change your mind, even after initially having agreed to have the treatment done. However, under this last option, if you wait until the last second to change your mind, you may be charged for the materials used up to that point.  Introduction: Medicine is not an Chief Strategy Officer. Everything in Medicine, including the lack of treatment(s), carries the potential for danger, harm, or loss (which is by definition: Risk). In Medicine, a complication is a secondary problem, condition, or disease that can aggravate an already existing one. All treatments carry the risk of possible complications. The fact that a side effects or complications occurs, does not imply that the treatment was conducted incorrectly. It must be clearly understood that these can happen even when everything is done following the highest safety standards.  No treatment: You can choose not to proceed with the proposed treatment alternative. The "PRO(s)" would include: avoiding the risk of complications associated with the therapy. The "CON(s)" would include: not getting any of the treatment benefits. These benefits fall under one of three categories: diagnostic; therapeutic; and/or palliative. Diagnostic benefits include: getting information which can ultimately lead to improvement of the disease or symptom(s). Therapeutic benefits are those associated with the successful treatment of the disease. Finally, palliative benefits are those related to the decrease of the primary symptoms, without necessarily curing the condition (example: decreasing the pain from a flare-up of a chronic condition, such as incurable terminal cancer).  General Risks and Complications: These are associated to most interventional treatments. They can occur alone, or in combination. They fall under one of the following six (6)  categories: no benefit or worsening of symptoms; bleeding; infection; nerve damage; allergic reactions; and/or death. No benefits or worsening of symptoms: In Medicine there are no guarantees, only probabilities. No healthcare provider can ever guarantee that a medical treatment will work, they can only state the probability that it may. Furthermore, there is always the possibility that the condition may worsen, either directly, or indirectly, as a consequence of the  treatment. Bleeding: This is more common if the patient is taking a blood thinner, either prescription or over the counter (example: Goody Powders, Fish oil, Aspirin, Garlic, etc.), or if suffering a condition associated with impaired coagulation (example: Hemophilia, cirrhosis of the liver, low platelet counts, etc.). However, even if you do not have one on these, it can still happen. If you have any of these conditions, or take one of these drugs, make sure to notify your treating physician. Infection: This is more common in patients with a compromised immune system, either due to disease (example: diabetes, cancer, human immunodeficiency virus [HIV], etc.), or due to medications or treatments (example: therapies used to treat cancer and rheumatological diseases). However, even if you do not have one on these, it can still happen. If you have any of these conditions, or take one of these drugs, make sure to notify your treating physician. Nerve Damage: This is more common when the treatment is an invasive one, but it can also happen with the use of medications, such as those used in the treatment of cancer. The damage can occur to small secondary nerves, or to large primary ones, such as those in the spinal cord and brain. This damage may be temporary or permanent and it may lead to impairments that can range from temporary numbness to permanent paralysis and/or brain death. Allergic Reactions: Any time a substance or material comes in contact  with our body, there is the possibility of an allergic reaction. These can range from a mild skin rash (contact dermatitis) to a severe systemic reaction (anaphylactic reaction), which can result in death. Death: In general, any medical intervention can result in death, most of the time due to an unforeseen complication. ____________________________________________________________________________________________

## 2022-06-11 NOTE — Progress Notes (Signed)
Nursing Pain Medication Assessment:  Safety precautions to be maintained throughout the outpatient stay will include: orient to surroundings, keep bed in low position, maintain call bell within reach at all times, provide assistance with transfer out of bed and ambulation.  Medication Inspection Compliance: Pill count conducted under aseptic conditions, in front of the patient. Neither the pills nor the bottle was removed from the patient's sight at any time. Once count was completed pills were immediately returned to the patient in their original bottle.  Medication: Oxycodone IR Pill/Patch Count:  40 of 89 pills remain Pill/Patch Appearance: Markings consistent with prescribed medication Bottle Appearance: Standard pharmacy container. Clearly labeled. Filled Date: 7 / 22 / 2023 Last Medication intake:  TodaySafety precautions to be maintained throughout the outpatient stay will include: orient to surroundings, keep bed in low position, maintain call bell within reach at all times, provide assistance with transfer out of bed and ambulation.

## 2022-07-17 ENCOUNTER — Ambulatory Visit: Payer: Medicare Other | Admitting: Pain Medicine

## 2022-07-20 ENCOUNTER — Telehealth: Payer: Self-pay | Admitting: Pain Medicine

## 2022-07-20 NOTE — Telephone Encounter (Signed)
Pharmacy called about pain meds '20mg'$  due to shortage. They do not have. They have '10mg'$ . Need to know if it is ok to fill script using this strength instead. Please call asap

## 2022-07-20 NOTE — Telephone Encounter (Signed)
Called MD, He states OK to fill with '10mg'$ . Called patient and informed him and also spoke with Pharmacist and told them it was OK

## 2022-09-05 NOTE — Progress Notes (Unsigned)
PROVIDER NOTE: Information contained herein reflects review and annotations entered in association with encounter. Interpretation of such information and data should be left to medically-trained personnel. Information provided to patient can be located elsewhere in the medical record under "Patient Instructions". Document created using STT-dictation technology, any transcriptional errors that may result from process are unintentional.    Patient: James [name]  Service Category: E/M  Provider: Gaspar Cola, MD  DOB: [birth year]  DOS: 09/17/2022  Referring Provider: Sheffield Slider, MD  MRN: [id number]  Specialty: Interventional Pain Management  PCP: Sheffield Slider, MD  Type: Established Patient  Setting: Ambulatory outpatient    Location: Office  Delivery: Face-to-face     HPI  Mr. EMMANUEL GRUENHAGEN, a 66 y.o. year old male, is here today because of his Chronic pain syndrome [G89.4]. Mr. Baldonado's primary complain today is No chief complaint on file. Last encounter: My last encounter with him was on 07/20/2022. Pertinent problems: Mr. Normoyle has Narrowing of intervertebral disc space; Chronic low back pain (1ry area of Pain) (Bilateral) (R>L); Lumbar spondylosis; Chronic neck pain (Right); Cervical spondylosis (C7 intravertebral body cyst); Chronic cervical radicular pain (Right); Chronic lumbar radicular pain (Bilateral) (L>R) (L5 Dermatome); Osteoarthritis; Chronic hip pain; Chronic knee pain (Bilateral) (R>L); Complex regional pain syndrome type II of upper extremity (Right); Chronic upper extremity pain (Right); 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); Epidural fibrosis; Chronic musculoskeletal pain; Neurogenic pain; Neuropathic pain; Chronic sacroiliac joint pain (Bilateral)  (R>L); Psoriatic arthritis ([place]); Chronic lower extremity pain (2ry area of Pain) (Bilateral) (L>R); Psoriasis with arthropathy ([place]); Numbness and tingling of both legs; Abnormal nerve conduction studies (06/20/16); Chronic pain syndrome; Perineal pain; Chest pain with high risk for cardiac etiology; Chronic upper extremity pain(R>L); Cervicalgia; DDD (degenerative disc disease), cervical; Closed fracture of lumbar vertebral body ([place]) (L2); Neurogenic bladder s/p L2 fracture (04/19/2020); Lumbosacral radiculopathy at L5 (Right); Compression fracture of L2 lumbar vertebra, sequela; Abnormal MRI, cervical spine (09/19/2018); Trigger finger, ring finger (Left); Occipital neuralgia (midline); and Trigger finger, ring finger (Right) on their pertinent problem list. Pain Assessment: Severity of   is reported as a  /10. Location:    / . Onset:  . Quality:  . Timing:  . Modifying factor(s):  Marland Kitchen Vitals:  vitals were not taken for this visit.   Reason for encounter: medication management. ***  12/19/2022   Pharmacotherapy Assessment  Analgesic: Oxycodone IR 20 mg, 1 tab PO q 8 hrs (60 mg/day of oxycodone) MME/day: 120 mg/day.   Monitoring: [place] PMP: PDMP reviewed during this encounter.       Pharmacotherapy: No side-effects or adverse reactions reported. Compliance: No problems identified. Effectiveness: Clinically acceptable.  No notes on file  No results found for: "CBDTHCR" No results found for: "D8THCCBX" No results found for: "D9THCCBX"  UDS:  Summary  Date Value Ref Range Status  03/13/2022 Note  Final    Comment:    ==================================================================== [employer] [facility] (MW) ==================================================================== Test                             Result       Flag       Units  Drug Present and Declared for Prescription Verification   Oxycodone  3181         EXPECTED   ng/mg creat   Oxymorphone                     >3367        EXPECTED   ng/mg creat   Noroxycodone                   >3367        EXPECTED   ng/mg creat   Noroxymorphone                 929          EXPECTED   ng/mg creat    Sources of oxycodone are scheduled prescription medications.    Oxymorphone, noroxycodone, and noroxymorphone are expected    metabolites of oxycodone. Oxymorphone is also available as a    scheduled prescription medication.  ==================================================================== Test                      Result    Flag   Units      Ref Range   Creatinine              297              mg/dL      >=20 ==================================================================== Declared Medications:  The flagging and interpretation on this report are based on the  following declared medications.  Unexpected results may arise from  inaccuracies in the declared medications.   **Note: The testing scope of this panel includes these medications:   Oxycodone   **Note: The testing scope of this panel does not include the  following reported medications:   Albuterol (Ventolin HFA)  Cholecalciferol  Levothyroxine (Synthroid)  Loratadine (Claritin)  Metoprolol (Lopressor)  Multivitamin  Naloxone (Narcan)  Tizanidine (Zanaflex)  Vitamin B  Vitamin D2 (Ergocalciferol) ==================================================================== For clinical consultation, please call [phone]. ====================================================================       ROS  Constitutional: Denies any fever or chills Gastrointestinal: No reported hemesis, hematochezia, vomiting, or acute GI distress Musculoskeletal: Denies any acute onset joint swelling, redness, loss of ROM, or weakness Neurological: No reported episodes of acute onset apraxia, aphasia, dysarthria, agnosia, amnesia, paralysis, loss of coordination, or loss of consciousness  Medication Review  Cholecalciferol, Ergocalciferol, Oxycodone  HCl, albuterol, b complex vitamins, levothyroxine, loratadine, metoprolol tartrate, multivitamin, and tiZANidine  History Review  Allergy: Mr. Dowty is allergic to penicillins, lactose, nsaids, talwin [pentazocine], codeine, procaine, and sulfa antibiotics. Drug: Mr. Crace  reports no history of drug use. Alcohol:  reports no history of alcohol use. Tobacco:  reports that he has quit smoking. His smoking use included cigarettes. His smokeless tobacco use includes chew. Social: Mr. Lashley  reports that he has quit smoking. His smoking use included cigarettes. His smokeless tobacco use includes chew. He reports that he does not drink alcohol and does not use drugs. Medical:  has a past medical history of Allergy, Anemia, Arthritis, Asthma, Blood transfusion without reported diagnosis, Bronchitis, Bursitis, Cancer ([place]), Cataract, 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09/13/2015), Emphysema of lung ([place]), Gastritis, GERD (gastroesophageal reflux disease), Heart murmur, Hepatitis C, Hiatal hernia, Hypertension, Hypothyroidism, Kidney stone, Lupus ([place]), Obesity, Osteoporosis, Peripheral nerve disease, Psoriatic arthritis ([place]), Sleep apnea, Supraventricular tachycardia ([facility]), Tendonitis, and Thyroid disease. Surgical: Mr. Marxen  has a past surgical history that includes Spine surgery; Eye surgery; Fracture surgery (Right); Fracture surgery (Bilateral); Fracture surgery (Bilateral); Joint replacement (Right); Spinal cord  stimulator implant; Spinal cord stimulator removal; Patella arthroplasty; Shoulder arthroscopy (Bilateral); Esophagogastroduodenoscopy (egd) with propofol (N/A, 01/20/2016); and Colonoscopy (2009). Family: family history includes Arthritis in his father and mother; COPD in his father; Cancer in his father and mother; Diabetes in his mother; Hematuria in his father and mother; Hypertension in his father and mother; Kidney disease in his mother; Kidney  failure in his mother; Prostate cancer in his father; Stroke in his mother.  Laboratory Chemistry Profile   Renal Lab Results  Component Value Date   BUN 15 08/19/2019   CREATININE 1.47 (H) 08/19/2019   GFRAA 59 (L) 08/19/2019   GFRNONAA 51 (L) 08/19/2019    Hepatic Lab Results  Component Value Date   AST 25 08/19/2019   ALT 24 08/19/2019   ALBUMIN 4.2 08/19/2019   ALKPHOS 47 08/19/2019    Electrolytes Lab Results  Component Value Date   NA 138 08/19/2019   K 4.1 08/19/2019   CL 100 08/19/2019   CALCIUM 9.3 08/19/2019   MG 2.1 08/19/2019    Bone Lab Results  Component Value Date   VD25OH 26.19 (L) 08/19/2019   25OHVITD1 42 12/06/2016   25OHVITD2 <1.0 12/06/2016   25OHVITD3 42 12/06/2016   TESTOSTERONE 285 02/12/2018    Inflammation (CRP: Acute Phase) (ESR: Chronic Phase) Lab Results  Component Value Date   CRP <0.8 08/19/2019   ESRSEDRATE 1 08/19/2019         Note: Above Lab results reviewed.  Recent Imaging Review  MR CERVICAL SPINE WO CONTRAST CLINICAL DATA:  Neck pain worse on the right with radiculopathy. Numbness in the arms and hands with worsening symptoms in the past 6 months.  EXAM: MRI CERVICAL SPINE WITHOUT CONTRAST  TECHNIQUE: Multiplanar, multisequence MR imaging of the cervical spine was performed. No intravenous contrast was administered.  COMPARISON:  12/13/2016  FINDINGS: Alignment: Chronic cervical spine straightening. Unchanged minimal anterolisthesis of C3 on C4.  Vertebrae: No fracture or suspicious osseous lesion. Chronic degenerative endplate changes at G8-3 with decreased degenerative endplate edema.  Cord: Normal signal.  Posterior Fossa, vertebral arteries, paraspinal tissues: Unremarkable.  Disc levels:  C2-3: Mild right and moderate to severe left facet arthrosis without stenosis, unchanged.  C3-4: Central disc protrusion, uncovertebral spurring, and severe right and moderate left facet arthrosis result in  borderline spinal stenosis with slight ventral cord flattening and moderate right neural foraminal stenosis, unchanged.  C4-5: Minimal disc bulging, mild uncovertebral spurring, and moderate right facet arthrosis without stenosis, unchanged.  C5-6: Moderate disc space narrowing. Disc bulging, uncovertebral spurring, and moderate right and mild left facet arthrosis result in mild spinal stenosis and moderate right greater than left neural foraminal stenosis, unchanged.  C6-7: Severe disc space narrowing. Broad-based posterior disc osteophyte complex results in mild spinal stenosis and mild-to-moderate right and borderline left neural foraminal stenosis, unchanged.  C7-T1: Minimal disc bulging and moderate to severe right and mild left facet arthrosis result in mild right neural foraminal stenosis without spinal stenosis, unchanged.  IMPRESSION: 1. Decreased degenerative endplate edema at M6-2. 2. Otherwise unchanged appearance of the cervical spine with up to mild spinal and moderate neural foraminal stenosis at multiple levels as above.  Electronically Signed   By: Logan Bores M.D.   On: 09/19/2018 10:30 Note: Reviewed        Physical Exam  General appearance: Well nourished, well developed, and well hydrated. In no apparent acute distress Mental status: Alert, oriented x 3 (person, place, & time)  Respiratory: No evidence of acute respiratory distress Eyes: PERLA Vitals: There were no vitals taken for this visit. BMI: Estimated body mass index is 36.62 kg/m as calculated from the following:   Height as of 06/11/22: 6' (1.829 m).   Weight as of 06/11/22: 270 lb (122.5 kg). Ideal: Patient weight not recorded  Assessment   Diagnosis Status  1. Chronic pain syndrome   2. Chronic lower extremity pain (2ry area of Pain) (Bilateral) (L>R)   3. Chronic low back pain (1ry area of Pain) (Bilateral) (R>L)   4. Cervicalgia (Right)   5. Cervical spondylosis (C7 intravertebral  body cyst)   6. Chronic knee pain (Bilateral) (R>L)   7. 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)   8. Chronic cervical radicular pain (Right)   9. Pharmacologic therapy   10. Chronic use of opiate for therapeutic purpose   11. Encounter for medication management   12. Encounter for chronic pain management    Controlled Controlled Controlled   Updated Problems: No problems updated.   Plan of Care  Problem-specific:  No problem-specific Assessment & Plan notes found for this encounter.  Mr. MYKAL BATIZ has a current medication list which includes the following long-term medication(s): albuterol, oxycodone hcl, and tizanidine.  Pharmacotherapy (Medications Ordered): No orders of the defined types were placed in this encounter.  Orders:  No orders of the defined types were placed in this encounter.  Follow-up plan:   No follow-ups on file.     Interventional Therapies  Risk  Complexity Considerations:   Estimated body mass index is 38.65 kg/m as calculated from the following:   Height as of 09/20/21: 6' (1.829 m).   Weight as of 09/20/21: 285 lb (129.3 kg). WNL   Planned  Pending:   Pending lumbar spine surgery on 01/23/2022.   Under consideration:      Completed:   Therapeutic right L4-5 LESI x1 (06/26/2016) (50/50/25)  Therapeutic bilateral lumbar facet MBB x2 (04/15/2017) (90/90/50/50)  Therapeutic left lumbar facet RFA x1 (07/18/17) (90/90/90/90)    Therapeutic  Palliative (PRN) options:   Palliative right CESI  Palliative right L4-5 LESI #2   Palliative bilateral lumbar facet MBB  Palliative left lumbar facet RFA #2  Palliative bilateral cervical facet MBB       Recent Visits No visits were found meeting these conditions. Showing recent visits within past 90 days and meeting all other requirements Future Appointments Date Type Provider Dept  09/17/22 Appointment Milinda Pointer, MD [facility]  Showing future  appointments within next 90 days and meeting all other requirements  I discussed the assessment and treatment plan with the patient. The patient was provided an opportunity to ask questions and all were answered. The patient agreed with the plan and demonstrated an understanding of the instructions.  Patient advised to call back or seek an in-person evaluation if the symptoms or condition worsens.  Duration of encounter: *** minutes.  Total time on encounter, as per AMA guidelines included both the face-to-face and non-face-to-face time personally spent by the physician and/or other qualified health care professional(s) on the day of the encounter (includes time in activities that require the physician or other qualified health care professional and does not include time in activities normally performed by clinical staff). Physician's time may include the following activities when performed: preparing to see the patient (eg, review of tests, pre-charting review of records) obtaining and/or reviewing separately obtained history performing a medically appropriate examination and/or evaluation counseling  and educating the patient/family/caregiver ordering medications, tests, or procedures referring and communicating with other health care professionals (when not separately reported) documenting clinical information in the electronic or other health record independently interpreting results (not separately reported) and communicating results to the patient/ family/caregiver care coordination (not separately reported)  Note by: Gaspar Cola, MD Date: 09/17/2022; Time: 5:56 PM

## 2022-09-05 NOTE — Patient Instructions (Signed)
____________________________________________________________________________________________  Patient Information update  To: All of our patients.  Re: Name change.  It has been made official that our current name, "[facility]"   will soon be changed to "[facility]".   The purpose of this change is to eliminate any confusion created by the concept of our practice being a "Medication Management Pain Clinic". In the past this has led to the misconception that we treat pain primarily by the use of prescription medications.  Nothing can be farther from the truth.   Understanding PAIN MANAGEMENT: To further understand what our practice does, you first have to understand that "Pain Management" is a subspecialty that requires additional training once a physician has completed their specialty training, which can be in either Anesthesia, Neurology, Psychiatry, or Physical Medicine and Rehabilitation (PMR). Each one of these contributes to the final approach taken by each physician to the management of their patient's pain. To be a "Pain Management Specialist" you must have first completed one of the specialty trainings below.  Anesthesiologists - trained in clinical pharmacology and interventional techniques such as nerve blockade and regional as well as central neuroanatomy. They are trained to block pain before, during, and after surgical interventions.  Neurologists - trained in the diagnosis and pharmacological treatment of complex neurological conditions, such as Multiple Sclerosis, Parkinson's, spinal cord injuries, and other systemic conditions that may be associated with symptoms that may include but are not limited to pain. They tend to rely primarily on the treatment of chronic pain using prescription medications.  Psychiatrist - trained in conditions affecting the psychosocial wellbeing  of patients including but not limited to depression, anxiety, schizophrenia, personality disorders, addiction, and other substance use disorders that may be associated with chronic pain. They tend to rely primarily on the treatment of chronic pain using prescription medications.   Physical Medicine and Rehabilitation (PMR) physicians, also known as physiatrists - trained to treat a wide variety of medical conditions affecting the brain, spinal cord, nerves, bones, joints, ligaments, muscles, and tendons. Their training is primarily aimed at treating patients that have suffered injuries that have caused severe physical impairment. Their training is primarily aimed at the physical therapy and rehabilitation of those patients. They may also work alongside orthopedic surgeons or neurosurgeons using their expertise in assisting surgical patients to recover after their surgeries.  INTERVENTIONAL PAIN MANAGEMENT is sub-subspecialty of Pain Management.  Our physicians are Board-certified in Anesthesia, Pain Management, and Interventional Pain Management.  This meaning that not only have they been trained and Board-certified in their specialty of Anesthesia, and subspecialty of Pain Management, but they have also received further training in the sub-subspecialty of Interventional Pain Management, in order to become Board-certified as INTERVENTIONAL PAIN MANAGEMENT SPECIALIST.    Mission: Our goal is to use our skills in  INTERVENTIONAL PAIN MANAGEMENT as alternatives to the chronic use of prescription opioid medications for the treatment of pain. To make this more clear, we have changed our name to reflect what we do and offer. We will continue to offer medication management assessment and recommendations, but we will not be taking over any patient's medication management.  ____________________________________________________________________________________________   _______________________________________________________________________  Medication Rules  Purpose: To inform patients, and their family members, of our medication rules and regulations.  Applies to: All patients receiving prescriptions from our practice (written or electronic).  Pharmacy of record: This is the pharmacy where your electronic prescriptions will be sent.   Make sure we have the correct one.  Electronic prescriptions: In compliance with the [place] Strengthen Opioid Misuse Prevention (STOP) Act of 2017 (Session Law 2017-74/H243), effective November 05, 2018, all controlled substances must be electronically prescribed. Written prescriptions, faxing, or calling prescriptions to a pharmacy will no longer be done.  Prescription refills: These will be provided only during in-person appointments. No medications will be renewed without a "face-to-face" evaluation with your provider. Applies to all prescriptions.  NOTE: The following applies primarily to controlled substances (Opioid* Pain Medications).   Type of encounter (visit): For patients receiving controlled substances, face-to-face visits are required. (Not an option and not up to the patient.)  Patient's responsibilities: Pain Pills: Bring all pain pills to every appointment (except for procedure appointments). Pill Bottles: Bring pills in original pharmacy bottle. Bring bottle, even if empty. Always bring the bottle of the most recent fill.  Medication refills: You are responsible for knowing and keeping track of what medications you are taking and when is it that you will need a refill. The day before your appointment: write a list of all prescriptions that need to be refilled. The day of the appointment: give the list to the admitting nurse. Prescriptions will be written only during appointments. No prescriptions will be written on procedure days. If you forget a medication: it will not be "Called in", "Faxed", or  "electronically sent". You will need to get another appointment to get these prescribed. No early refills. Do not call asking to have your prescription filled early. Partial  or short prescriptions: Occasionally your pharmacy may not have enough pills to fill your prescription.  NEVER ACCEPT a partial fill or a prescription that is short of the total amount of pills that you were prescribed.  With controlled substances the law allows 72 hours for the pharmacy to complete the prescription.  If the prescription is not completed within 72 hours, the pharmacist will require a new prescription to be written. This means that you will be short on your medicine and we WILL NOT send another prescription to complete your original prescription.  Instead, request the pharmacy to send a carrier to a nearby branch to get enough medication to provide you with your full prescription. Prescription Accuracy: You are responsible for carefully inspecting your prescriptions before leaving our office. Have the discharge nurse carefully go over each prescription with you, before taking them home. Make sure that your name is accurately spelled, that your address is correct. Check the name and dose of your medication to make sure it is accurate. Check the number of pills, and the written instructions to make sure they are clear and accurate. Make sure that you are given enough medication to last until your next medication refill appointment. Taking Medication: Take medication as prescribed. When it comes to controlled substances, taking less pills or less frequently than prescribed is permitted and encouraged. Never take more pills than instructed. Never take the medication more frequently than prescribed.  Inform other Doctors: Always inform, all of your healthcare providers, of all the medications you take. Pain Medication from other Providers: You are not allowed to accept any additional pain medication from any other Doctor or  Healthcare provider. There are two exceptions to this rule. (see below) In the event that you require additional pain medication, you are responsible for notifying us, as stated below. Cough Medicine: Often these contain an opioid, such as codeine or hydrocodone. Never accept or take cough medicine containing these opioids if   you are already taking an opioid* medication. The combination may cause respiratory failure and death. Medication Agreement: You are responsible for carefully reading and following our Medication Agreement. This must be signed before receiving any prescriptions from our practice. Safely store a copy of your signed Agreement. Violations to the Agreement will result in no further prescriptions. (Additional copies of our Medication Agreement are available upon request.) Laws, Rules, & Regulations: All patients are expected to follow all Federal and State Laws, Statutes, Rules, & Regulations. Ignorance of the Laws does not constitute a valid excuse.  Illegal drugs and Controlled Substances: The use of illegal substances (including, but not limited to marijuana and its derivatives) and/or the illegal use of any controlled substances is strictly prohibited. Violation of this rule may result in the immediate and permanent discontinuation of any and all prescriptions being written by our practice. The use of any illegal substances is prohibited. Adopted CDC guidelines & recommendations: Target dosing levels will be at or below 60 MME/day. Use of benzodiazepines** is not recommended.  Exceptions: There are only two exceptions to the rule of not receiving pain medications from other Healthcare Providers. Exception #1 (Emergencies): In the event of an emergency (i.e.: accident requiring emergency care), you are allowed to receive additional pain medication. However, you are responsible for: As soon as you are able, call our office [phone], at any time of the day or night, and leave a  message stating your name, the date and nature of the emergency, and the name and dose of the medication prescribed. In the event that your call is answered by a member of our staff, make sure to document and save the date, time, and the name of the person that took your information.  Exception #2 (Planned Surgery): In the event that you are scheduled by another doctor or dentist to have any type of surgery or procedure, you are allowed (for a period no longer than 30 days), to receive additional pain medication, for the acute post-op pain. However, in this case, you are responsible for picking up a copy of our "Post-op Pain Management for Surgeons" handout, and giving it to your surgeon or dentist. This document is available at our office, and does not require an appointment to obtain it. Simply go to our office during business hours (Monday-Thursday from 8:00 AM to 4:00 PM) (Friday 8:00 AM to 12:00 Noon) or if you have a scheduled appointment with us, prior to your surgery, and ask for it by name. In addition, you are responsible for: calling our office [phone], at any time of the day or night, and leaving a message stating your name, name of your surgeon, type of surgery, and date of procedure or surgery. Failure to comply with your responsibilities may result in termination of therapy involving the controlled substances. Medication Agreement Violation. Following the above rules, including your responsibilities will help you in avoiding a Medication Agreement Violation ("Breaking your Pain Medication Contract").  Consequences:  Not following the above rules may result in permanent discontinuation of medication prescription therapy.  *Opioid medications include: morphine, codeine, oxycodone, oxymorphone, hydrocodone, hydromorphone, meperidine, tramadol, tapentadol, buprenorphine, fentanyl, methadone. **Benzodiazepine medications include: diazepam (Valium), alprazolam (Xanax), clonazepam (Klonopine),  lorazepam (Ativan), clorazepate (Tranxene), chlordiazepoxide (Librium), estazolam (Prosom), oxazepam (Serax), temazepam (Restoril), triazolam (Halcion) (Last updated: 08/28/2022) ______________________________________________________________________   ______________________________________________________________________  Medication Recommendations and Reminders  Applies to: All patients receiving prescriptions (written and/or electronic).  Medication Rules & Regulations: You are responsible for reading, knowing, and   following our "Medication Rules" document. These exist for your safety and that of others. They are not flexible and neither are we. Dismissing or ignoring them is an act of "non-compliance" that may result in complete and irreversible termination of such medication therapy. For safety reasons, "non-compliance" will not be tolerated. As with the [place] fundamental legal principle of "ignorance of the law is no defense", we will accept no excuses for not having read and knowing the content of documents provided to you by our practice.  Pharmacy of record:  Definition: This is the pharmacy where your electronic prescriptions will be sent.  We do not endorse any particular pharmacy. It is up to you and your insurance to decide what pharmacy to use.  We do not restrict you in your choice of pharmacy. However, once we write for your prescriptions, we will NOT be re-sending more prescriptions to fix restricted supply problems created by your pharmacy, or your insurance.  The pharmacy listed in the electronic medical record should be the one where you want electronic prescriptions to be sent. If you choose to change pharmacy, simply notify our nursing staff. Changes will be made only during your regular appointments and not over the phone.  Recommendations: Keep all of your pain medications in a safe place, under lock and key, even if you live alone. We will NOT replace lost, stolen, or  damaged medication. We do not accept "Police Reports" as proof of medications having been stolen. After you fill your prescription, take 1 week's worth of pills and put them away in a safe place. You should keep a separate, properly labeled bottle for this purpose. The remainder should be kept in the original bottle. Use this as your primary supply, until it runs out. Once it's gone, then you know that you have 1 week's worth of medicine, and it is time to come in for a prescription refill. If you do this correctly, it is unlikely that you will ever run out of medicine. To make sure that the above recommendation works, it is very important that you make sure your medication refill appointments are scheduled at least 1 week before you run out of medicine. To do this in an effective manner, make sure that you do not leave the office without scheduling your next medication management appointment. Always ask the nursing staff to show you in your prescription , when your medication will be running out. Then arrange for the receptionist to get you a return appointment, at least 7 days before you run out of medicine. Do not wait until you have 1 or 2 pills left, to come in. This is very poor planning and does not take into consideration that we may need to cancel appointments due to bad weather, sickness, or emergencies affecting our staff. DO NOT ACCEPT A "Partial Fill": If for any reason your pharmacy does not have enough pills/tablets to completely fill or refill your prescription, do not allow for a "partial fill". The law allows the pharmacy to complete that prescription within 72 hours, without requiring a new prescription. If they do not fill the rest of your prescription within those 72 hours, you will need a separate prescription to fill the remaining amount, which we will NOT provide. If the reason for the partial fill is your insurance, you will need to talk to the pharmacist about payment alternatives for  the remaining tablets, but again, DO NOT ACCEPT A PARTIAL FILL, unless you can trust your pharmacist to obtain   the remainder of the pills within 72 hours.  Prescription refills and/or changes in medication(s):  Prescription refills, and/or changes in dose or medication, will be conducted only during scheduled medication management appointments. (Applies to both, written and electronic prescriptions.) No refills on procedure days. No medication will be changed or started on procedure days. No changes, adjustments, and/or refills will be conducted on a procedure day. Doing so will interfere with the diagnostic portion of the procedure. No phone refills. No medications will be "called into the pharmacy". No Fax refills. No weekend refills. No Holliday refills. No after hours refills.  Remember:  Business hours are:  Monday to Thursday 8:00 AM to 4:00 PM Provider's Schedule: Cleota Pellerito, MD - Appointments are:  Medication management: Monday and Wednesday 8:00 AM to 4:00 PM Procedure day: Tuesday and Thursday 7:30 AM to 4:00 PM Bilal Lateef, MD - Appointments are:  Medication management: Tuesday and Thursday 8:00 AM to 4:00 PM Procedure day: Monday and Wednesday 7:30 AM to 4:00 PM (Last update: 08/28/2022) ______________________________________________________________________  ____________________________________________________________________________________________  Pharmacy Shortages of Pain Medication   Introduction Shockingly as it may seem, .  "No U.[place] Supreme Court decision has ever interpreted the Constitution as guaranteeing a right to health care for all Americans." - https://www.healthequityandpolicylab.com/elusive-right-to-health-care-under-us-law  "With respect to human rights, the [place] has no formally codified right to health, nor does it participate in a human rights treaty that specifies a right to health." - Scott J. Schweikart, JD, MBE  Situation By  now, most of our patients have had the experience of being told by their pharmacist that they do not have enough medication to cover their prescription. If you have not had this experience, just know that you soon will.  Problem There appears to be a shortage of these medications, either at the national level or locally. This is happening with all pharmacies. When there is not enough medication, patients are offered a partial fill and they are told that they will try to get the rest of the medicine for them at a later time. If they do not have enough for even a partial fill, the pharmacists are telling the patients to call us (the prescribing physicians) to request that we send another prescription to another pharmacy to get the medicine.   This reordering of a controlled substance creates documentation problems where additional paperwork needs to be created to explain why two prescriptions for the same period of time and the same medicine are being prescribed to the same patient. It also creates situations where the last appointment note does not accurately reflect when and what prescriptions were given to a patient. This leads to prescribing errors down the line, in subsequent follow-up visits.   [place] Board of Pharmacy ([place]BOP) Research revealed that Board of Pharmacy Rule .1806 (21 NCAC 46.1806) authorizes pharmacists to the transfer of prescriptions among pharmacies, and it sets forth procedural and recordkeeping requirements for doing so. However, this requires the pharmacist to complete the previously mentioned procedural paperwork to accomplish the transfer. As it turns out, it is much easier for them to have the prescribing physicians do the work.   Possible solutions 1. You can ask your physician to assist you in weaning yourself off these medications. 2. Ask your pharmacy if the medication is in stock, 3 days prior to your refill. 3. If you need a pharmacy change, let us know at your  medication management visit. Prescriptions that have already been electronically sent to a pharmacy will   not be re-sent to a different pharmacy if your pharmacy of record does not have it in stock. Proper stocking of medication is a pharmacy problem, not a prescriber problem. Work with your pharmacist to solve the problem. 4. Have the [place] Assembly add a provision to the "STOP ACT" (the law that mandates how controlled substances are prescribed) where there is an exception to the electronic prescribing rule that states that in the event there are shortages of medications the physicians are allowed to use written prescriptions as opposed to electronic ones. This would allow patients to take their prescriptions to a different pharmacy that may have enough medication available to fill the prescription. The problem is that currently there is a law that does not allow for written prescriptions, with the exception of instances where the electronic medical record is down due to technical issues.  5. Have [place] Congress ease the pressure on pharmaceutical companies, allowing them to produce enough quantities of the medication to adequately supply the population. 6. Have pharmacies keep enough stocks of these medications to cover their client base.  7. Have the [place] Assembly add a provision to the "STOP ACT" where they ease the regulations surrounding the transfer of controlled substances between pharmacies, so as to simplify the transfer of supplies. As an alternative, develop a system to allow patients to obtain the remainder of their prescription at another one of their pharmacies or at an associate pharmacy.   How this shortage will affect you.  Understand that this is a pharmacy supply problem, not a prescriber problem. Work with your pharmacy to solve it. The job of the prescriber is to evaluate and monitor the patient for the appropriate indications and use of these medicines. It is  not the job of the prescriber to supply the medication or to solve problems with that supply. The responsibility and the choice to obtain the medication resides on the patient. By law, supplying the medication is the job of the pharmacy. It is certainly not the job of the prescriber to solve supply problems.   Due to the above problems we are no longer taking patients to write for their pain medication. Future discussions with your physician may include potentially weaning medications or transitioning to alternatives.  We will be focusing primarily on interventional based pain management. We will continue to evaluate for appropriate indications and we may provide recommendations regarding medication, dose, and schedule, as well as monitoring recommendations, however, we will not be taking over the actual prescribing of these substances. On those patients where we are treating their chronic pain with interventional therapies, exceptions will be considered on a case by case basis. At this time, we will try to continue providing this supplemental service to those patients we have been managing in the past. However, as of August 1st, 2023, we no longer will be sending additional prescriptions to other pharmacies for the purpose of solving their supply problems. Once we send a prescription to a pharmacy, we will not be resending it again to another pharmacy to cover for their shortages.   What to do. Write as many letters as you can. Recruit the help of family members in writing these letters. Below are some of the places where you can write to make your voice heard. Let them know what the problem is and push them to look for solutions.   Search internet for: "[place] find your legislators" https://www.ncleg.gov/findyourlegislators  Search internet for: " insurance commissioner   complaints" https://www.ncdoi.gov/contactscomplaints/assistance-or-file-complaint  Search internet for: "[place] Board of Pharmacy complaints" http://www.ncbop.org/contact.htm  Search internet for: "[facility] pharmacy complaints" Email [facility] Customer Relations https://www.[facility].com/help/email-customer-relations.jsp?callType=store  Search internet for: "[facility] pharmacy customer service complaints" https://www.[facility].com/topic/marketing/contactus/contactus_customerservice.jsp  ____________________________________________________________________________________________  ____________________________________________________________________________________________  Drug Holidays (Slow)  What is a "Drug Holiday"? Drug Holiday: is the name given to the period of time during which a patient stops taking a medication(s) for the purpose of eliminating tolerance to the drug.  Benefits Improved effectiveness of opioids. Decreased opioid dose needed to achieve benefits. Improved pain with lesser dose.  What is tolerance? Tolerance: is the progressive decreased in effectiveness of a drug due to its repetitive use. With repetitive use, the body gets use to the medication and as a consequence, it loses its effectiveness. This is a common problem seen with opioid pain medications. As a result, a larger dose of the drug is needed to achieve the same effect that used to be obtained with a smaller dose.  How long should a "Drug Holiday" last? You should stay off of the pain medicine for at least 14 consecutive days. (2 weeks)  Should I stop the medicine "cold turkey"? No. You should always coordinate with your Pain Specialist so that he/she can provide you with the correct medication dose to make the transition as smoothly as possible.  How do I stop the medicine? Slowly. You will be instructed to decrease the daily amount of pills that you take by one (1) pill every seven (7) days. This is called a "slow downward taper" of your dose. For example: if you normally take four (4) pills per day, you will  be asked to drop this dose to three (3) pills per day for seven (7) days, then to two (2) pills per day for seven (7) days, then to one (1) per day for seven (7) days, and at the end of those last seven (7) days, this is when the "Drug Holiday" would start.   Will I have withdrawals? By doing a "slow downward taper" like this one, it is unlikely that you will experience any significant withdrawal symptoms. Typically, what triggers withdrawals is the sudden stop of a high dose opioid therapy. Withdrawals can usually be avoided by slowly decreasing the dose over a prolonged period of time. If you do not follow these instructions and decide to stop your medication abruptly, withdrawals may be possible.  What are withdrawals? Withdrawals: refers to the wide range of symptoms that occur after stopping or dramatically reducing opiate drugs after heavy and prolonged use. Withdrawal symptoms do not occur to patients that use low dose opioids, or those who take the medication sporadically. Contrary to benzodiazepine (example: Valium, Xanax, etc.) or alcohol withdrawals ("Delirium Tremens"), opioid withdrawals are not lethal. Withdrawals are the physical manifestation of the body getting rid of the excess receptors.  Expected Symptoms Early symptoms of withdrawal may include: Agitation Anxiety Muscle aches Increased tearing Insomnia Runny nose Sweating Yawning  Late symptoms of withdrawal may include: Abdominal cramping Diarrhea Dilated pupils Goose bumps Nausea Vomiting  Will I experience withdrawals? Due to the slow nature of the taper, it is very unlikely that you will experience any.  What is a slow taper? Taper: refers to the gradual decrease in dose.  (Last update: 05/25/2020) ____________________________________________________________________________________________   ____________________________________________________________________________________________  CBD (cannabidiol) &  Delta-8 (Delta-8 tetrahydrocannabinol) WARNING  Intro: Cannabidiol (CBD) and tetrahydrocannabinol (THC), are two natural compounds found in plants of the Cannabis genus. They can both be extracted   from hemp or cannabis. Hemp and cannabis come from the Cannabis sativa plant. Both compounds interact with your body's endocannabinoid system, but they have very different effects. CBD does not produce the high sensation associated with cannabis. Delta-8 tetrahydrocannabinol, also known as delta-8 THC, is a psychoactive substance found in the Cannabis sativa plant, of which marijuana and hemp are two varieties. THC is responsible for the high associated with the illicit use of marijuana.  Applicable to: All individuals currently taking or considering taking CBD (cannabidiol) and, more important, all patients taking opioid analgesic controlled substances (pain medication). (Example: oxycodone; oxymorphone; hydrocodone; hydromorphone; morphine; methadone; tramadol; tapentadol; fentanyl; buprenorphine; butorphanol; dextromethorphan; meperidine; codeine; etc.)  Legal status: CBD remains a Schedule I drug prohibited for any use. CBD is illegal with one exception. In the [place], CBD has a limited Food and Drug Administration (FDA) approval for the treatment of two specific types of epilepsy disorders. Only one CBD product has been approved by the FDA for this purpose: "Epidiolex". FDA is aware that some companies are marketing products containing cannabis and cannabis-derived compounds in ways that violate the Federal Food, Drug and Cosmetic Act (FD&C Act) and that may put the health and safety of consumers at risk. The FDA, a Federal agency, has not enforced the CBD status since 2018. UPDATE: (12/22/2021) The Drug Enforcement Agency (DEA) issued a letter stating that "delta" cannabinoids, including Delta-8-THCO and Delta-9-THCO, synthetically derived from hemp do not qualify as hemp and will be viewed as Schedule I  drugs. (Schedule I drugs, substances, or chemicals are defined as drugs with no currently accepted medical use and a high potential for abuse. Some examples of Schedule I drugs are: heroin, lysergic acid diethylamide (LSD), marijuana (cannabis), 3,4-methylenedioxymethamphetamine (ecstasy), methaqualone, and peyote.) (https://www.dea.gov)  Legality: Some manufacturers ship CBD products nationally, which is illegal. Often such products are sold online and are therefore available throughout the country. CBD is openly sold in head shops and health food stores in some states where such sales have not been explicitly legalized. Selling unapproved products with unsubstantiated therapeutic claims is not only a violation of the law, but also can put patients at risk, as these products have not been proven to be safe or effective. Federal illegality makes it difficult to conduct research on CBD.  Reference: "FDA Regulation of Cannabis and Cannabis-Derived Products, Including Cannabidiol (CBD)" - https://www.fda.gov/news-events/public-health-focus/fda-regulation-cannabis-and-cannabis-derived-products-including-cannabidiol-cbd  Warning: CBD is not FDA approved and has not undergo the same manufacturing controls as prescription drugs.  This means that the purity and safety of available CBD may be questionable. Most of the time, despite manufacturer's claims, it is contaminated with THC (delta-9-tetrahydrocannabinol - the chemical in marijuana responsible for the "HIGH").  When this is the case, the THC contaminant will trigger a positive urine drug screen (UDS) test for Marijuana (carboxy-THC). Because a positive UDS for any illicit substance is a violation of our medication agreement, your opioid analgesics (pain medicine) may be permanently discontinued. The FDA recently put out a warning about 5 things that everyone should be aware of regarding Delta-8 THC: Delta-8 THC products have not been evaluated or approved by  the FDA for safe use and may be marketed in ways that put the public health at risk. The FDA has received adverse event reports involving delta-8 THC-containing products. Delta-8 THC has psychoactive and intoxicating effects. Delta-8 THC manufacturing often involve use of potentially harmful chemicals to create the concentrations of delta-8 THC claimed in the marketplace. The final delta-8 THC product may have potentially   harmful by-products (contaminants) due to the chemicals used in the process. Manufacturing of delta-8 THC products may occur in uncontrolled or unsanitary settings, which may lead to the presence of unsafe contaminants or other potentially harmful substances. Delta-8 THC products should be kept out of the reach of children and pets.  MORE ABOUT CBD  General Information: CBD was discovered in 1940 and it is a derivative of the cannabis sativa genus plants (Marijuana and Hemp). It is one of the 113 identified substances found in Marijuana. It accounts for up to 40% of the plant's extract. As of 2018, preliminary clinical studies on CBD included research for the treatment of anxiety, movement disorders, and pain. CBD is available and consumed in multiple forms, including inhalation of smoke or vapor, as an aerosol spray, and by mouth. It may be supplied as an oil containing CBD, capsules, dried cannabis, or as a liquid solution. CBD is thought not to be as psychoactive as THC (delta-9-tetrahydrocannabinol - the chemical in marijuana responsible for the "HIGH"). Studies suggest that CBD may interact with different biological target receptors in the body, including cannabinoid and other neurotransmitter receptors. As of 2018 the mechanism of action for its biological effects has not been determined.  Side-effects  Adverse reactions: Dry mouth, diarrhea, decreased appetite, fatigue, drowsiness, malaise, weakness, sleep disturbances, and others.  Drug interactions: CBC may interact with other  medications such as blood-thinners. Because CBD causes drowsiness on its own, it also increases the drowsiness caused by other medications, including antihistamines (such as Benadryl), benzodiazepines (Xanax, Ativan, Valium), antipsychotics, antidepressants and opioids, as well as alcohol and supplements such as kava, melatonin and St. John's Wort. Be cautious with the following combinations:   Brivaracetam (Briviact) Brivaracetam is changed and broken down by the body. CBD might decrease how quickly the body breaks down brivaracetam. This might increase levels of brivaracetam in the body.  Caffeine Caffeine is changed and broken down by the body. CBD might decrease how quickly the body breaks down caffeine. This might increase levels of caffeine in the body.  Carbamazepine (Tegretol) Carbamazepine is changed and broken down by the body. CBD might decrease how quickly the body breaks down carbamazepine. This might increase levels of carbamazepine in the body and increase its side effects.  Citalopram (Celexa) Citalopram is changed and broken down by the body. CBD might decrease how quickly the body breaks down citalopram. This might increase levels of citalopram in the body and increase its side effects.  Clobazam (Onfi) Clobazam is changed and broken down by the liver. CBD might decrease how quickly the liver breaks down clobazam. This might increase the effects and side effects of clobazam.  Eslicarbazepine (Aptiom) Eslicarbazepine is changed and broken down by the body. CBD might decrease how quickly the body breaks down eslicarbazepine. This might increase levels of eslicarbazepine in the body by a small amount.  Everolimus (Zostress) Everolimus is changed and broken down by the body. CBD might decrease how quickly the body breaks down everolimus. This might increase levels of everolimus in the body.  Lithium Taking higher doses of CBD might increase levels of lithium. This can increase  the risk of lithium toxicity.  Medications changed by the liver (Cytochrome P450 1A1 (CYP1A1) substrates) Some medications are changed and broken down by the liver. CBD might change how quickly the liver breaks down these medications. This could change the effects and side effects of these medications.  Medications changed by the liver (Cytochrome P450 1A2 (CYP1A2) substrates) Some medications are   changed and broken down by the liver. CBD might change how quickly the liver breaks down these medications. This could change the effects and side effects of these medications.  Medications changed by the liver (Cytochrome P450 1B1 (CYP1B1) substrates) Some medications are changed and broken down by the liver. CBD might change how quickly the liver breaks down these medications. This could change the effects and side effects of these medications.  Medications changed by the liver (Cytochrome P450 2A6 (CYP2A6) substrates) Some medications are changed and broken down by the liver. CBD might change how quickly the liver breaks down these medications. This could change the effects and side effects of these medications.  Medications changed by the liver (Cytochrome P450 2B6 (CYP2B6) substrates) Some medications are changed and broken down by the liver. CBD might change how quickly the liver breaks down these medications. This could change the effects and side effects of these medications.  Medications changed by the liver (Cytochrome P450 2C19 (CYP2C19) substrates) Some medications are changed and broken down by the liver. CBD might change how quickly the liver breaks down these medications. This could change the effects and side effects of these medications.  Medications changed by the liver (Cytochrome P450 2C8 (CYP2C8) substrates) Some medications are changed and broken down by the liver. CBD might change how quickly the liver breaks down these medications. This could change the effects and side effects  of these medications.  Medications changed by the liver (Cytochrome P450 2C9 (CYP2C9) substrates) Some medications are changed and broken down by the liver. CBD might change how quickly the liver breaks down these medications. This could change the effects and side effects of these medications.  Medications changed by the liver (Cytochrome P450 2D6 (CYP2D6) substrates) Some medications are changed and broken down by the liver. CBD might change how quickly the liver breaks down these medications. This could change the effects and side effects of these medications.  Medications changed by the liver (Cytochrome P450 2E1 (CYP2E1) substrates) Some medications are changed and broken down by the liver. CBD might change how quickly the liver breaks down these medications. This could change the effects and side effects of these medications.  Medications changed by the liver (Cytochrome P450 3A4 (CYP3A4) substrates) Some medications are changed and broken down by the liver. CBD might change how quickly the liver breaks down these medications. This could change the effects and side effects of these medications.  Medications changed by the liver (Glucuronidated drugs) Some medications are changed and broken down by the liver. CBD might change how quickly the liver breaks down these medications. This could change the effects and side effects of these medications.  Medications that decrease the breakdown of other medications by the liver (Cytochrome P450 2C19 (CYP2C19) inhibitors) CBD is changed and broken down by the liver. Some drugs decrease how quickly the liver changes and breaks down CBD. This could change the effects and side effects of CBD.  Medications that decrease the breakdown of other medications in the liver (Cytochrome P450 3A4 (CYP3A4) inhibitors) CBD is changed and broken down by the liver. Some drugs decrease how quickly the liver changes and breaks down CBD. This could change the effects  and side effects of CBD.  Medications that increase breakdown of other medications by the liver (Cytochrome P450 3A4 (CYP3A4) inducers) CBD is changed and broken down by the liver. Some drugs increase how quickly the liver changes and breaks down CBD. This could change the effects and side effects of   CBD.  Medications that increase the breakdown of other medications by the liver (Cytochrome P450 2C19 (CYP2C19) inducers) CBD is changed and broken down by the liver. Some drugs increase how quickly the liver changes and breaks down CBD. This could change the effects and side effects of CBD.  Methadone (Dolophine) Methadone is broken down by the liver. CBD might decrease how quickly the liver breaks down methadone. Taking cannabidiol along with methadone might increase the effects and side effects of methadone.  Rufinamide (Banzel) Rufinamide is changed and broken down by the body. CBD might decrease how quickly the body breaks down rufinamide. This might increase levels of rufinamide in the body by a small amount.  Sedative medications (CNS depressants) CBD might cause sleepiness and slowed breathing. Some medications, called sedatives, can also cause sleepiness and slowed breathing. Taking CBD with sedative medications might cause breathing problems and/or too much sleepiness.  Sirolimus (Rapamune) Sirolimus is changed and broken down by the body. CBD might decrease how quickly the body breaks down sirolimus. This might increase levels of sirolimus in the body.  Stiripentol (Diacomit) Stiripentol is changed and broken down by the body. CBD might decrease how quickly the body breaks down stiripentol. This might increase levels of stiripentol in the body and increase its side effects.  Tacrolimus (Prograf) Tacrolimus is changed and broken down by the body. CBD might decrease how quickly the body breaks down tacrolimus. This might increase levels of tacrolimus in the body.  Tamoxifen  (Soltamox) Tamoxifen is changed and broken down by the body. CBD might affect how quickly the body breaks down tamoxifen. This might affect levels of tamoxifen in the body.  Topiramate (Topamax) Topiramate is changed and broken down by the body. CBD might decrease how quickly the body breaks down topiramate. This might increase levels of topiramate in the body by a small amount.  Valproate Valproic acid can cause liver injury. Taking cannabidiol with valproic acid might increase the chance of liver injury. CBD and/or valproic acid might need to be stopped, or the dose might need to be reduced.  Warfarin (Coumadin) CBD might increase levels of warfarin, which can increase the risk for bleeding. CBD and/or warfarin might need to be stopped, or the dose might need to be reduced.  Zonisamide Zonisamide is changed and broken down by the body. CBD might decrease how quickly the body breaks down zonisamide. This might increase levels of zonisamide in the body by a small amount. (Last update: 01/03/2022) ____________________________________________________________________________________________  Naloxone Nasal Spray What is this medication? NALOXONE (nal OX one) treats opioid overdose, which causes slow or shallow breathing, severe drowsiness, or trouble staying awake. Call emergency services after using this medication. You may need additional treatment. Naloxone works by reversing the effects of opioids. It belongs to a group of medications called opioid blockers. This medicine may be used for other purposes; ask your health care provider or pharmacist if you have questions. COMMON BRAND NAME(S): Kloxxado, Narcan What should I tell my care team before I take this medication? They need to know if you have any of these conditions: Heart disease Substance use disorder An unusual or allergic reaction to naloxone, other medications, foods, dyes, or preservatives Pregnant or trying to get  pregnant Breast-feeding How should I use this medication? This medication is for use in the nose. Lay the person on their back. Support their neck with your hand and allow the head to tilt back before giving the medication. The nasal spray should be given into   1 nostril. After giving the medication, move the person onto their side. Do not remove or test the nasal spray until ready to use. Get emergency medical help right away after giving the first dose of this medication, even if the person wakes up. You should be familiar with how to recognize the signs and symptoms of a narcotic overdose. If more doses are needed, give the additional dose in the other nostril. Talk to your care team about the use of this medication in children. While this medication may be prescribed for children as young as newborns for selected conditions, precautions do apply. Overdosage: If you think you have taken too much of this medicine contact a poison control center or emergency room at once. NOTE: This medicine is only for you. Do not share this medicine with others. What if I miss a dose? This does not apply. What may interact with this medication? This is only used during an emergency. No interactions are expected during emergency use. This list may not describe all possible interactions. Give your health care provider a list of all the medicines, herbs, non-prescription drugs, or dietary supplements you use. Also tell them if you smoke, drink alcohol, or use illegal drugs. Some items may interact with your medicine. What should I watch for while using this medication? Keep this medication ready for use in the case of an opioid overdose. Make sure that you have the phone number of your care team and local hospital ready. You may need to have additional doses of this medication. Each nasal spray contains a single dose. Some emergencies may require additional doses. After use, bring the treated person to the nearest  hospital or call 911. Make sure the treating care team knows that the person has received a dose of this medication. You will receive additional instructions on what to do during and after use of this medication before an emergency occurs. What side effects may I notice from receiving this medication? Side effects that you should report to your care team as soon as possible: Allergic reactions--skin rash, itching, hives, swelling of the face, lips, tongue, or throat Side effects that usually do not require medical attention (report these to your care team if they continue or are bothersome): Constipation Dryness or irritation inside the nose Headache Increase in blood pressure Muscle spasms Stuffy nose Toothache This list may not describe all possible side effects. Call your doctor for medical advice about side effects. You may report side effects to FDA at 1-800-FDA-1088. Where should I keep my medication? Keep out of the reach of children and pets. Store between 20 and 25 degrees C (68 and 77 degrees F). Do not freeze. Throw away any unused medication after the expiration date. Keep in original box until ready to use. NOTE: This sheet is a summary. It may not cover all possible information. If you have questions about this medicine, talk to your doctor, pharmacist, or health care provider.  2023 [employer] Standard (2021-06-30 00:00:00)  

## 2022-09-11 ENCOUNTER — Telehealth: Payer: Self-pay

## 2022-09-11 NOTE — Telephone Encounter (Signed)
He wants to know if you will do a TPI on his finger when he comes on 11/13 for med refil, so he wont have to make 2 trips.. Please let me know.

## 2022-09-11 NOTE — Telephone Encounter (Signed)
Left voicemail informing patient that Dr. Dossie Arbour is unavailable this week to ask, instructed him to ask when he is here for appt.

## 2022-09-17 ENCOUNTER — Ambulatory Visit: Payer: Medicare Other | Attending: Pain Medicine | Admitting: Pain Medicine

## 2022-09-17 ENCOUNTER — Encounter: Payer: Self-pay | Admitting: Pain Medicine

## 2022-09-17 VITALS — BP 151/82 | HR 85 | Temp 97.5°F | Resp 16 | Ht 71.0 in | Wt 270.0 lb

## 2022-09-17 DIAGNOSIS — G8929 Other chronic pain: Secondary | ICD-10-CM | POA: Diagnosis present

## 2022-09-17 DIAGNOSIS — Z79891 Long term (current) use of opiate analgesic: Secondary | ICD-10-CM | POA: Insufficient documentation

## 2022-09-17 DIAGNOSIS — M5412 Radiculopathy, cervical region: Secondary | ICD-10-CM | POA: Diagnosis present

## 2022-09-17 DIAGNOSIS — M79605 Pain in left leg: Secondary | ICD-10-CM | POA: Diagnosis present

## 2022-09-17 DIAGNOSIS — M25562 Pain in left knee: Secondary | ICD-10-CM | POA: Diagnosis present

## 2022-09-17 DIAGNOSIS — M65331 Trigger finger, right middle finger: Secondary | ICD-10-CM | POA: Insufficient documentation

## 2022-09-17 DIAGNOSIS — M542 Cervicalgia: Secondary | ICD-10-CM | POA: Diagnosis not present

## 2022-09-17 DIAGNOSIS — M79604 Pain in right leg: Secondary | ICD-10-CM | POA: Insufficient documentation

## 2022-09-17 DIAGNOSIS — M545 Low back pain, unspecified: Secondary | ICD-10-CM | POA: Diagnosis not present

## 2022-09-17 DIAGNOSIS — M65342 Trigger finger, left ring finger: Secondary | ICD-10-CM | POA: Insufficient documentation

## 2022-09-17 DIAGNOSIS — M961 Postlaminectomy syndrome, not elsewhere classified: Secondary | ICD-10-CM | POA: Insufficient documentation

## 2022-09-17 DIAGNOSIS — G894 Chronic pain syndrome: Secondary | ICD-10-CM | POA: Insufficient documentation

## 2022-09-17 DIAGNOSIS — Z79899 Other long term (current) drug therapy: Secondary | ICD-10-CM | POA: Diagnosis present

## 2022-09-17 DIAGNOSIS — M25561 Pain in right knee: Secondary | ICD-10-CM | POA: Insufficient documentation

## 2022-09-17 DIAGNOSIS — M47812 Spondylosis without myelopathy or radiculopathy, cervical region: Secondary | ICD-10-CM | POA: Diagnosis present

## 2022-09-17 MED ORDER — LIDOCAINE HCL 2 % IJ SOLN
20.0000 mL | Freq: Once | INTRAMUSCULAR | Status: AC
  Administered 2022-09-17: 400 mg

## 2022-09-17 MED ORDER — ROPIVACAINE HCL 2 MG/ML IJ SOLN
4.0000 mL | Freq: Once | INTRAMUSCULAR | Status: AC
  Administered 2022-09-17: 20 mL

## 2022-09-17 MED ORDER — TRIAMCINOLONE ACETONIDE 40 MG/ML IJ SUSP
INTRAMUSCULAR | Status: AC
  Filled 2022-09-17: qty 1

## 2022-09-17 MED ORDER — TRIAMCINOLONE ACETONIDE 40 MG/ML IJ SUSP
40.0000 mg | Freq: Once | INTRAMUSCULAR | Status: AC
  Administered 2022-09-17: 40 mg

## 2022-09-17 MED ORDER — OXYCODONE HCL 20 MG PO TABS: 20.0000 mg | ORAL_TABLET | Freq: Three times a day (TID) | ORAL | 0 refills | Status: DC | PRN

## 2022-09-17 MED ORDER — ROPIVACAINE HCL 2 MG/ML IJ SOLN
INTRAMUSCULAR | Status: AC
  Filled 2022-09-17: qty 20

## 2022-09-17 MED ORDER — LIDOCAINE HCL 2 % IJ SOLN
INTRAMUSCULAR | Status: AC
  Filled 2022-09-17: qty 20

## 2022-09-17 MED ORDER — PENTAFLUOROPROP-TETRAFLUOROETH EX AERO
INHALATION_SPRAY | Freq: Once | CUTANEOUS | Status: AC
  Administered 2022-09-17: 30 via TOPICAL
  Filled 2022-09-17: qty 116

## 2022-09-17 MED ORDER — NALOXONE HCL 4 MG/0.1ML NA LIQD: 1.0000 | NASAL | 0 refills | Status: DC | PRN

## 2022-09-17 NOTE — Progress Notes (Signed)
PROVIDER NOTE: Interpretation of information contained herein should be left to medically-trained personnel. Specific patient instructions are provided elsewhere under "Patient Instructions" section of medical record. This document was created in part using STT-dictation technology, any transcriptional errors that may result from this process are unintentional.  Patient: James [name] Type: Established DOB: September 09, 1956 MRN: [id number] PCP: Sheffield Slider, MD  Service: Procedure DOS: 09/17/2022 Setting: Ambulatory Location: Ambulatory outpatient facility Delivery: Face-to-face Provider: Gaspar Cola, MD Specialty: Interventional Pain Management Specialty designation: 09 Location: Outpatient facility Ref. Prov.: Sheffield Slider, MD    Primary Reason for Visit: Interventional Pain Management Treatment. CC: Back Pain (Upper to lower bilateral) and Hand Pain (Trigger finger ring finger Left and middle finger on right. )    Procedure:          Anesthesia, Analgesia, Anxiolysis:  Type: Trigger Finger Ligament/Tendon sheath (20550) Injection.           Purpose: Diagnostic/Therapeutric Target Area: Flexor Digitorum Tendon sheath nodule Region: A-1(proximal) pulley of the metacarpal area Approach: Percutaneous Digit: No:5(Pinky) Finger Laterality: Bilateral (this right middle finger; left ring finger)  Type: Local Anesthesia Local Anesthetic: Lidocaine 1-2% Sedation: None  Indication(s):  Analgesia Route: Infiltration ([place]/IM) IV Access: N/A   Position: Sitting   1. Trigger finger, ring finger (Left)   2. Trigger finger, middle finger (Right)    NAS-11 Pain score:   Pre-procedure: 6 /10   Post-procedure: 0-No pain/10     Pre-op H&P Assessment:  Mr. Detloff is a 66 y.o. (year old), male patient, seen today for interventional treatment. He  has a past surgical history that includes Spine surgery; Eye surgery; Fracture surgery (Right); Fracture surgery (Bilateral); Fracture surgery  (Bilateral); Joint replacement (Right); Spinal cord stimulator implant; Spinal cord stimulator removal; Patella arthroplasty; Shoulder arthroscopy (Bilateral); Esophagogastroduodenoscopy (egd) with propofol (N/A, 01/20/2016); and Colonoscopy (2009). Mr. Samples has a current medication list which includes the following prescription(s): albuterol, b complex vitamins, cholecalciferol, ergocalciferol, levothyroxine, loratadine, metoprolol succinate, multivitamin, naloxone, [START ON 09/20/2022] oxycodone hcl, [START ON 10/20/2022] oxycodone hcl, [START ON 11/19/2022] oxycodone hcl, and tizanidine. His primarily concern today is the Back Pain (Upper to lower bilateral) and Hand Pain (Trigger finger ring finger Left and middle finger on right. )  Initial Vital Signs:  Pulse/HCG Rate: 80  Temp: (!) 97.5 F (36.4 C) Resp: 16 BP: 137/66 SpO2: 97 %  BMI: Estimated body mass index is 37.66 kg/m as calculated from the following:   Height as of this encounter: '5\' 11"'$  (1.803 m).   Weight as of this encounter: 270 lb (122.5 kg).  Risk Assessment: Allergies: Reviewed. He is allergic to penicillins, lactose, nsaids, talwin [pentazocine], codeine, procaine, and sulfa antibiotics.  Allergy Precautions: None required Coagulopathies: Reviewed. None identified.  Blood-thinner therapy: None at this time Active Infection(s): Reviewed. None identified. Mr. Horrigan is afebrile  Site Confirmation: Mr. Leckey was asked to confirm the procedure and laterality before marking the site Procedure checklist: Completed Consent: Before the procedure and under the influence of no sedative(s), amnesic(s), or anxiolytics, the patient was informed of the treatment options, risks and possible complications. To fulfill our ethical and legal obligations, as recommended by the American Medical Association's Code of Ethics, I have informed the patient of my clinical impression; the nature and purpose of the treatment or procedure;  the risks, benefits, and possible complications of the intervention; the alternatives, including doing nothing; the risk(s) and benefit(s) of the alternative treatment(s) or procedure(s); and the risk(s) and benefit(s) of doing nothing.  The patient was provided information about the general risks and possible complications associated with the procedure. These may include, but are not limited to: failure to achieve desired goals, infection, bleeding, organ or nerve damage, allergic reactions, paralysis, and death. In addition, the patient was informed of those risks and complications associated to the procedure, such as failure to decrease pain; infection; bleeding; organ or nerve damage with subsequent damage to sensory, motor, and/or autonomic systems, resulting in permanent pain, numbness, and/or weakness of one or several areas of the body; allergic reactions; (i.e.: anaphylactic reaction); and/or death. Furthermore, the patient was informed of those risks and complications associated with the medications. These include, but are not limited to: allergic reactions (i.e.: anaphylactic or anaphylactoid reaction(s)); adrenal axis suppression; blood sugar elevation that in diabetics may result in ketoacidosis or comma; water retention that in patients with history of congestive heart failure may result in shortness of breath, pulmonary edema, and decompensation with resultant heart failure; weight gain; swelling or edema; medication-induced neural toxicity; particulate matter embolism and blood vessel occlusion with resultant organ, and/or nervous system infarction; and/or aseptic necrosis of one or more joints. Finally, the patient was informed that Medicine is not an exact science; therefore, there is also the possibility of unforeseen or unpredictable risks and/or possible complications that may result in a catastrophic outcome. The patient indicated having understood very clearly. We have given the patient no  guarantees and we have made no promises. Enough time was given to the patient to ask questions, all of which were answered to the patient's satisfaction. Mr. Heigl has indicated that he wanted to continue with the procedure. Attestation: I, the ordering provider, attest that I have discussed with the patient the benefits, risks, side-effects, alternatives, likelihood of achieving goals, and potential problems during recovery for the procedure that I have provided informed consent. Date  Time: 09/17/2022  9:55 AM  Pre-Procedure Preparation:  Monitoring: As per clinic protocol. Respiration, ETCO2, SpO2, BP, heart rate and rhythm monitor placed and checked for adequate function Safety Precautions: Patient was assessed for positional comfort and pressure points before starting the procedure. Time-out: I initiated and conducted the "Time-out" before starting the procedure, as per protocol. The patient was asked to participate by confirming the accuracy of the "Time Out" information. Verification of the correct person, site, and procedure were performed and confirmed by me, the nursing staff, and the patient. "Time-out" conducted as per Joint Commission's Universal Protocol (UP.01.01.01). Time: 1039  Description of Procedure:          Area Prepped: Entire palmar and dorsal aspect of hand, up to forearm area. DuraPrep (Iodine Povacrylex [0.7% available iodine] and Isopropyl Alcohol, 74% w/w) Safety Precautions: Aspiration looking for blood return was conducted prior to all injections. At no point did we inject any substances, as a needle was being advanced. No attempts were made at seeking any paresthesias. Safe injection practices and needle disposal techniques used. Medications properly checked for expiration dates. SDV (single dose vial) medications used. Description of the Procedure: Protocol guidelines were followed. The patient was placed in position. The target area was identified and prepped in  the usual manner. Skin & deeper tissues infiltrated with local anesthetic. Appropriate time provided for local anesthetics to take effect. The procedure needle was slowly advanced to target area. Proper needle placement secured. Negative aspiration confirmed. Solution injected in intermittent fashion, asking for systemic symptoms every 0.5cc. Needle(s) removed and area cleaned, making sure to leave some prepping solution back to take  advantage of its long term bactericidal properties.  Vitals:   09/17/22 0950 09/17/22 1044  BP: 137/66 (!) 151/82  Pulse: 80 85  Resp: 16   Temp: (!) 97.5 F (36.4 C)   TempSrc: Temporal   SpO2: 97% 99%  Weight: 270 lb (122.5 kg)   Height: '5\' 11"'$  (1.803 m)     Start Time: 1039 hrs. End Time: 1043 hrs. Materials:  Needle(s) Type: Epidural needle Gauge: 20G Length: 3.5-in Medication(s): Please see orders for medications and dosing details.    Imaging Guidance:          Type of Imaging Technique: None used Indication(s): N/A Exposure Time: No patient exposure Contrast: None used. Fluoroscopic Guidance: N/A Ultrasound Guidance: N/A Interpretation: N/A  Post-operative Assessment:  Post-procedure Vital Signs:  Pulse/HCG Rate: 85  Temp: (!) 97.5 F (36.4 C) Resp: 16 BP: (!) 151/82 SpO2: 99 %  EBL: None  Complications: No immediate post-treatment complications observed by team, or reported by patient.  Note: The patient tolerated the entire procedure well. A repeat set of vitals were taken after the procedure and the patient was kept under observation following institutional policy, for this type of procedure. Post-procedural neurological assessment was performed, showing return to baseline, prior to discharge. The patient was provided with post-procedure discharge instructions, including a section on how to identify potential problems. Should any problems arise concerning this procedure, the patient was given instructions to immediately contact us,  at any time, without hesitation. In any case, we plan to contact the patient by telephone for a follow-up status report regarding this interventional procedure.  Comments:  No additional relevant information.  Plan of Care  Orders:  Orders Placed This Encounter  Procedures   Injection tendon or ligament    Scheduling Instructions:     Type of Block:  Trigger Finger injection     Side: Bilateral     Sedation: No Sedation.     Timeframe: Today   Informed Consent Details: Physician/Practitioner Attestation; Transcribe to consent form and obtain patient signature    Nursing Order: Transcribe to consent form and obtain patient signature. Note: Always confirm laterality of pain with Mr. Mcgillis, before procedure.    Order Specific Question:   Physician/Practitioner attestation of informed consent for procedure/surgical case    Answer:   I, the physician/practitioner, attest that I have discussed with the patient the benefits, risks, side effects, alternatives, likelihood of achieving goals and potential problems during recovery for the procedure that I have provided informed consent.    Order Specific Question:   Procedure    Answer:   Trigger finger injection    Order Specific Question:   Physician/Practitioner performing the procedure    Answer:   Georgi Tuel A. Dossie Arbour, MD    Order Specific Question:   Indication/Reason    Answer:   Chronic hand and/or finger pain secondary to acquired trigger finger   Provide equipment / supplies at bedside    "Block Tray" (Disposable  single use) Needle type: SpinalRegular Amount/quantity: 2 Size: Short(1.5-inch) Gauge: (25G x1) + (22G x1)    Standing Status:   Standing    Number of Occurrences:   1    Order Specific Question:   Specify    Answer:   Block Tray   Chronic Opioid Analgesic:  Oxycodone IR 20 mg, 1 tab PO q 8 hrs (60 mg/day of oxycodone) MME/day: 120 mg/day.   Medications ordered for procedure: Meds ordered this encounter   Medications   Oxycodone HCl 20  MG TABS    Sig: Take 1 tablet (20 mg total) by mouth every 8 (eight) hours as needed. Must last 30 days    Dispense:  90 tablet    Refill:  0    DO NOT: delete (not duplicate); no partial-fill (will deny script to complete), no refill request (F/U required). DISPENSE: 1 day early if closed on fill date. WARN: No CNS-depressants within 8 hrs of med.   Oxycodone HCl 20 MG TABS    Sig: Take 1 tablet (20 mg total) by mouth every 8 (eight) hours as needed. Must last 30 days    Dispense:  90 tablet    Refill:  0    DO NOT: delete (not duplicate); no partial-fill (will deny script to complete), no refill request (F/U required). DISPENSE: 1 day early if closed on fill date. WARN: No CNS-depressants within 8 hrs of med.   Oxycodone HCl 20 MG TABS    Sig: Take 1 tablet (20 mg total) by mouth every 8 (eight) hours as needed. Must last 30 days    Dispense:  90 tablet    Refill:  0    DO NOT: delete (not duplicate); no partial-fill (will deny script to complete), no refill request (F/U required). DISPENSE: 1 day early if closed on fill date. WARN: No CNS-depressants within 8 hrs of med.   naloxone (NARCAN) nasal spray 4 mg/0.1 mL    Sig: Place 1 spray into the nose as needed for up to 365 doses (for opioid-induced respiratory depresssion). In case of emergency (overdose), spray once into each nostril. If no response within 3 minutes, repeat application and call [phone].    Dispense:  1 each    Refill:  0    Instruct patient in proper use of device.   lidocaine (XYLOCAINE) 2 % (with pres) injection 400 mg   pentafluoroprop-tetrafluoroeth (GEBAUERS) aerosol   ropivacaine (PF) 2 mg/mL (0.2%) (NAROPIN) injection 4 mL   triamcinolone acetonide (KENALOG-40) injection 40 mg   Medications administered: We administered lidocaine, pentafluoroprop-tetrafluoroeth, ropivacaine (PF) 2 mg/mL (0.2%), and triamcinolone acetonide.  See the medical record for exact dosing, route, and time of  administration.  Follow-up plan:   Return in about 3 months (around 12/19/2022) for Eval-day (M,W), (F2F), (MM).       Interventional Therapies  Risk  Complexity Considerations:   Estimated body mass index is 38.65 kg/m as calculated from the following:   Height as of 09/20/21: 6' (1.829 m).   Weight as of 09/20/21: 285 lb (129.3 kg). WNL   Planned  Pending:   Pending lumbar spine surgery on 01/23/2022.   Under consideration:      Completed:   Therapeutic right L4-5 LESI x1 (06/26/2016) (50/50/25)  Therapeutic bilateral lumbar facet MBB x2 (04/15/2017) (90/90/50/50)  Therapeutic left lumbar facet RFA x1 (07/18/17) (90/90/90/90)    Therapeutic  Palliative (PRN) options:   Palliative right CESI  Palliative right L4-5 LESI #2   Palliative bilateral lumbar facet MBB  Palliative left lumbar facet RFA #2  Palliative bilateral cervical facet MBB       Recent Visits No visits were found meeting these conditions. Showing recent visits within past 90 days and meeting all other requirements Today's Visits Date Type Provider Dept  09/17/22 Office Visit Milinda Pointer, MD [facility]  Showing today's visits and meeting all other requirements Future Appointments Date Type Provider Dept  10/01/22 Appointment Milinda Pointer, [facility]  12/12/22 Appointment Milinda Pointer, MD [facility]  Showing future appointments within next 90 days and meeting all other requirements  Disposition: Discharge home  Discharge (Date  Time): 09/17/2022; 1050 hrs.   Primary Care Physician: Sheffield Slider, MD Location: [facility] Note by: Gaspar Cola, MD Date: 09/17/2022; Time: 10:49 AM  Disclaimer:  Medicine is not an Chief Strategy Officer. The only guarantee in medicine is that nothing is guaranteed. It is important to note that the decision to proceed with this intervention was based on the information collected from the  patient. The Data and conclusions were drawn from the patient's questionnaire, the interview, and the physical examination. Because the information was provided in large part by the patient, it cannot be guaranteed that it has not been purposely or unconsciously manipulated. Every effort has been made to obtain as much relevant data as possible for this evaluation. It is important to note that the conclusions that lead to this procedure are derived in large part from the available data. Always take into account that the treatment will also be dependent on availability of resources and existing treatment guidelines, considered by other Pain Management Practitioners as being common knowledge and practice, at the time of the intervention. For Medico-Legal purposes, it is also important to point out that variation in procedural techniques and pharmacological choices are the acceptable norm. The indications, contraindications, technique, and results of the above procedure should only be interpreted and judged by a Board-Certified Interventional Pain Specialist with extensive familiarity and expertise in the same exact procedure and technique.

## 2022-09-17 NOTE — Progress Notes (Signed)
Nursing Pain Medication Assessment:  Safety precautions to be maintained throughout the outpatient stay will include: orient to surroundings, keep bed in low position, maintain call bell within reach at all times, provide assistance with transfer out of bed and ambulation.  Medication Inspection Compliance: Pill count conducted under aseptic conditions, in front of the patient. Neither the pills nor the bottle was removed from the patient's sight at any time. Once count was completed pills were immediately returned to the patient in their original bottle.  Medication: Oxycodone IR Pill/Patch Count:  10 of 90 pills remain Pill/Patch Appearance: Markings consistent with prescribed medication Bottle Appearance: Standard pharmacy container. Clearly labeled. Filled Date: 65 / 17 / 2023 Last Medication intake:  Today

## 2022-09-29 NOTE — Progress Notes (Addendum)
Patient: James [name]  Service Category: E/M  Provider: Gaspar Cola, MD  DOB: [birth year]  DOS: 10/01/2022  Location: Office  MRN: [id number]  Setting: Ambulatory outpatient  Referring Provider: Sheffield Slider, MD  Type: Established Patient  Specialty: Interventional Pain Management  PCP: Sheffield Slider, MD  Location: Remote location  Delivery: TeleHealth     Virtual Encounter - Pain Management PROVIDER NOTE: Information contained herein reflects review and annotations entered in association with encounter. Interpretation of such information and data should be left to medically-trained personnel. Information provided to patient can be located elsewhere in the medical record under "Patient Instructions". Document created using STT-dictation technology, any transcriptional errors that may result from process are unintentional.    Contact & Pharmacy Preferred: [phone] Home: [phone] (home) Mobile: [phone] (mobile) E-mail: [email or web address]  [facility] [place] [address] [place] [address] Phone: [phone] Fax: [phone]   Pre-screening  James [name] offered "in-person" vs "virtual" encounter. He indicated preferring virtual for this encounter.   Reason COVID-19*  Social distancing based on CDC and AMA recommendations.   I contacted Sanjuana Kava on 10/01/2022 via telephone.      I clearly identified myself as Gaspar Cola, MD. I verified that I was speaking with the correct person using two identifiers (Name: LASHAN GLUTH, and date of birth: [birth year]).  Consent I sought verbal advanced consent from Sanjuana Kava for virtual visit interactions. I informed James [name] of possible security and privacy concerns, risks, and limitations associated with providing "not-in-person" medical evaluation and management services. I also informed [name] of the availability of  "in-person" appointments. Finally, I informed him that there would be a charge for the virtual visit and that he could be  personally, fully or partially, financially responsible for it. James [name] expressed understanding and agreed to proceed.   Historic Elements   James [name] is a 66 y.o. year old, male patient evaluated today after our last contact on 09/17/2022. 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 He also  has a past surgical history that includes Spine surgery; Eye surgery; Fracture surgery (Right); Fracture surgery (Bilateral); Fracture surgery (Bilateral); Joint replacement (Right); Spinal cord stimulator implant; Spinal cord stimulator removal; Patella arthroplasty; Shoulder arthroscopy (Bilateral); Esophagogastroduodenoscopy (egd) with propofol (N/A, 01/20/2016); and Colonoscopy (2009). James [name] has a current medication list which includes the following prescription(s): albuterol, b complex vitamins, cholecalciferol, ergocalciferol, levothyroxine, loratadine, metoprolol succinate, multivitamin, naloxone, oxycodone hcl, [START ON 10/20/2022] oxycodone hcl, [START ON 11/19/2022] oxycodone hcl, and tizanidine. He  reports that he has quit smoking. His smoking use included cigarettes. His smokeless tobacco use includes chew. He reports that he does not drink alcohol and does not use drugs. James [name] is allergic to gabapentin, penicillins, lactose, nsaids,  talwin [pentazocine], codeine, procaine, and sulfa antibiotics.   HPI  Today, he is being contacted for a post-procedure assessment.  The patient indicates having attained a 90% improvement of the left trigger finger pain but on the right side he refers that it only went up to  about 30% improvement.  Today I have offered to have bedside repeated and he indicated that if he continues to be like this, he will give me a call and we will set it up.  For now, he wants to hold.  We will follow-up with him on his scheduled medication management encounter.  He was encouraged to give [place] a call if he needs [place] sooner than that.  He understood and accepted.  Post-procedure evaluation    Procedure:          Anesthesia, Analgesia, Anxiolysis:  Type: Trigger Finger Ligament/Tendon sheath (20550) Injection.           Purpose: Diagnostic/Therapeutric Target Area: Flexor Digitorum Tendon sheath nodule Region: A-1(proximal) pulley of the metacarpal area Approach: Percutaneous Digit: No:5(Pinky) Finger Laterality: Bilateral (this right middle finger; left ring finger)  Type: Local Anesthesia Local Anesthetic: Lidocaine 1-2% Sedation: None  Indication(s):  Analgesia Route: Infiltration (Tye/IM) IV Access: N/A   Position: Sitting   1. Trigger finger, ring finger (Left)   2. Trigger finger, middle finger (Right)    NAS-11 Pain score:   Pre-procedure: 6 /10   Post-procedure: 0-No pain/10     Effectiveness:  Initial hour after procedure: 100 %. Subsequent 4-6 hours post-procedure: 100 %. Analgesia past initial 6 hours:  (right 30%  left 60%).  The patient refers that the left side has continued to improvement since the call make by the nursing staff, he has gone up to a 90% improvement.  Unfortunately, the right side has not. Ongoing improvement:  Analgesic: Patient indicates an ongoing 30% improvement on the right hand and a 90% improvement on the left hand. Function: Somewhat improved ROM: Somewhat  improved  Pharmacotherapy Assessment   Opioid Analgesic: Oxycodone IR 20 mg, 1 tab PO q 8 hrs (60 mg/day of oxycodone) MME/day: 120 mg/day.   Monitoring: [place] PMP: PDMP reviewed during this encounter.       Pharmacotherapy: No side-effects or adverse reactions reported. Compliance: No problems identified. Effectiveness: Clinically acceptable. Plan: Refer to "POC". UDS:  Summary  Date Value Ref Range Status  03/13/2022 Note  Final    Comment:    ==================================================================== [employer] Select 13 (MW) ==================================================================== Test                             Result       Flag       Units  Drug Present and Declared for Prescription Verification   Oxycodone                      3181         EXPECTED   ng/mg creat   Oxymorphone                    >3367        EXPECTED   ng/mg creat   Noroxycodone                   >3367        EXPECTED   ng/mg creat   Noroxymorphone                 929          EXPECTED   ng/mg creat    Sources of oxycodone are scheduled prescription medications.    Oxymorphone, noroxycodone, and noroxymorphone are expected    metabolites of oxycodone. Oxymorphone is also available as a  scheduled prescription medication.  ==================================================================== Test                      Result    Flag   Units      Ref Range   Creatinine              297              mg/dL      >=20 ==================================================================== Declared Medications:  The flagging and interpretation on this report are based on the  following declared medications.  Unexpected results may arise from  inaccuracies in the declared medications.   **Note: The testing scope of this panel includes these medications:   Oxycodone   **Note: The testing scope of this panel does not include the  following reported medications:   Albuterol (Ventolin HFA)   Cholecalciferol  Levothyroxine (Synthroid)  Loratadine (Claritin)  Metoprolol (Lopressor)  Multivitamin  Naloxone (Narcan)  Tizanidine (Zanaflex)  Vitamin B  Vitamin D2 (Ergocalciferol) ==================================================================== For clinical consultation, please call [phone]. ====================================================================    No results found for: "CBDTHCR", "D8THCCBX", "D9THCCBX"   Laboratory Chemistry Profile   Renal Lab Results  Component Value Date   BUN 15 08/19/2019   CREATININE 1.47 (H) 08/19/2019   GFRAA 59 (L) 08/19/2019   GFRNONAA 51 (L) 08/19/2019    Hepatic Lab Results  Component Value Date   AST 25 08/19/2019   ALT 24 08/19/2019   ALBUMIN 4.2 08/19/2019   ALKPHOS 47 08/19/2019    Electrolytes Lab Results  Component Value Date   NA 138 08/19/2019   K 4.1 08/19/2019   CL 100 08/19/2019   CALCIUM 9.3 08/19/2019   MG 2.1 08/19/2019    Bone Lab Results  Component Value Date   VD25OH 26.19 (L) 08/19/2019   25OHVITD1 42 12/06/2016   25OHVITD2 <1.0 12/06/2016   25OHVITD3 42 12/06/2016   TESTOSTERONE 285 02/12/2018    Inflammation (CRP: Acute Phase) (ESR: Chronic Phase) Lab Results  Component Value Date   CRP <0.8 08/19/2019   ESRSEDRATE 1 08/19/2019         Note: Above Lab results reviewed.  Imaging  MR CERVICAL SPINE WO CONTRAST CLINICAL DATA:  Neck pain worse on the right with radiculopathy. Numbness in the arms and hands with worsening symptoms in the past 6 months.  EXAM: MRI CERVICAL SPINE WITHOUT CONTRAST  TECHNIQUE: Multiplanar, multisequence MR imaging of the cervical spine was performed. No intravenous contrast was administered.  COMPARISON:  12/13/2016  FINDINGS: Alignment: Chronic cervical spine straightening. Unchanged minimal anterolisthesis of C3 on C4.  Vertebrae: No fracture or suspicious osseous lesion. Chronic degenerative endplate changes at C3-4 with decreased  degenerative endplate edema.  Cord: Normal signal.  Posterior Fossa, vertebral arteries, paraspinal tissues: Unremarkable.  Disc levels:  C2-3: Mild right and moderate to severe left facet arthrosis without stenosis, unchanged.  C3-4: Central disc protrusion, uncovertebral spurring, and severe right and moderate left facet arthrosis result in borderline spinal stenosis with slight ventral cord flattening and moderate right neural foraminal stenosis, unchanged.  C4-5: Minimal disc bulging, mild uncovertebral spurring, and moderate right facet arthrosis without stenosis, unchanged.  C5-6: Moderate disc space narrowing. Disc bulging, uncovertebral spurring, and moderate right and mild left facet arthrosis result in mild spinal stenosis and moderate right greater than left neural foraminal stenosis, unchanged.  C6-7: Severe disc space narrowing. Broad-based posterior disc osteophyte complex results in mild spinal stenosis and mild-to-moderate right and borderline left neural foraminal stenosis,  unchanged.  C7-T1: Minimal disc bulging and moderate to severe right and mild left facet arthrosis result in mild right neural foraminal stenosis without spinal stenosis, unchanged.  IMPRESSION: 1. Decreased degenerative endplate edema at D4-3. 2. Otherwise unchanged appearance of the cervical spine with up to mild spinal and moderate neural foraminal stenosis at multiple levels as above.  Electronically Signed   By: Logan Bores M.D.   On: 09/19/2018 10:30  Assessment  The primary encounter diagnosis was Trigger finger, ring finger (Left). Diagnoses of Trigger finger, middle finger (Right), Chronic pain syndrome, Opiate use (120 MME/Day), Physical tolerance to opiate drug, and Drug tolerance, sequela were also pertinent to this visit.  Plan of Care  Problem-specific:  No problem-specific Assessment & Plan notes found for this encounter.  James [name] has a current  medication list which includes the following long-term medication(s): albuterol, naloxone, oxycodone hcl, [START ON 10/20/2022] oxycodone hcl, [START ON 11/19/2022] oxycodone hcl, and tizanidine.  Pharmacotherapy (Medications Ordered): No orders of the defined types were placed in this encounter.  Orders:  No orders of the defined types were placed in this encounter.  Follow-up plan:   Return for scheduled encounter.      Interventional Therapies  Risks  Complex Considerations:   A-fib; type 2 diabetes; low T; OSA; aortic valve insufficiency; opiate use (120 MME/day)   Planned  Pending:      Under consideration:      Completed:   Therapeutic left ring trigger finger + right middle trigger finger inj. x1 (09/17/2022) (100/100/100/L:30R:90)  Therapeutic right L4-5 LESI x1 (06/26/2016) (50/50/25)  Therapeutic bilateral lumbar facet MBB x2 (04/15/2017) (90/90/50/50)  Therapeutic left lumbar facet RFA x1 (07/18/17) (90/90/90/90)    Completed by other providers:   Lumbar spine surgery (01/23/2022). EMG & NCV (06/20/2016) by Gurney Maxin, MD [facility]) Dx: Generalized LE polyneuropathy + superimposed right lumbosacral radiculopathy   Therapeutic  Palliative (PRN) options:   Palliative right CESI  Palliative right L4-5 LESI #2   Palliative bilateral lumbar facet MBB  Palliative left lumbar facet RFA #2  Palliative bilateral cervical facet MBB    Pharmacological Recommendations:   Nonopioids transferred 08/31/2020: Zanaflex      Recent Visits Date Type Provider Dept  09/17/22 Office Visit Milinda Pointer, MD [facility]  Showing recent visits within past 90 days and meeting all other requirements Today's Visits Date Type Provider Dept  10/01/22 Office Visit Milinda Pointer, MD [facility]  Showing today's visits and meeting all other requirements Future Appointments Date Type Provider Dept  12/12/22 Appointment Milinda Pointer, MD  [facility]  Showing future appointments within next 90 days and meeting all other requirements  I discussed the assessment and treatment plan with the patient. The patient was provided an opportunity to ask questions and all were answered. The patient agreed with the plan and demonstrated an understanding of the instructions.  Patient advised to call back or seek an in-person evaluation if the symptoms or condition worsens.  Duration of encounter: 12 minutes.  Note by: Gaspar Cola, MD Date: 10/01/2022; Time: 10:46 AM

## 2022-10-01 ENCOUNTER — Ambulatory Visit: Payer: Medicare Other | Attending: Pain Medicine | Admitting: Pain Medicine

## 2022-10-01 DIAGNOSIS — M65342 Trigger finger, left ring finger: Secondary | ICD-10-CM | POA: Diagnosis not present

## 2022-10-01 DIAGNOSIS — G894 Chronic pain syndrome: Secondary | ICD-10-CM | POA: Diagnosis not present

## 2022-10-01 DIAGNOSIS — Z79891 Long term (current) use of opiate analgesic: Secondary | ICD-10-CM | POA: Diagnosis not present

## 2022-10-01 DIAGNOSIS — T50905S Adverse effect of unspecified drugs, medicaments and biological substances, sequela: Secondary | ICD-10-CM | POA: Insufficient documentation

## 2022-10-01 DIAGNOSIS — F1190 Opioid use, unspecified, uncomplicated: Secondary | ICD-10-CM

## 2022-10-01 DIAGNOSIS — M65331 Trigger finger, right middle finger: Secondary | ICD-10-CM

## 2022-12-09 NOTE — Progress Notes (Unsigned)
PROVIDER NOTE: Information contained herein reflects review and annotations entered in association with encounter. Interpretation of such information and data should be left to medically-trained personnel. Information provided to patient can be located elsewhere in the medical record under "Patient Instructions". Document created using STT-dictation technology, any transcriptional errors that may result from process are unintentional.    Patient: James [name]  Service Category: E/M  Provider: Gaspar Cola, MD  DOB: 1955/11/12  DOS: 12/12/2022  Referring Provider: Sheffield Slider, MD  MRN: [id number]  Specialty: Interventional Pain Management  PCP: Sheffield Slider, MD  Type: Established Patient  Setting: Ambulatory outpatient    Location: Office  Delivery: Face-to-face     HPI  James [name], a 67 y.o. year old male, is here today because of his No primary diagnosis found.. James [name]'s primary complain today is No chief complaint on file. Last encounter: My last encounter with him was on 10/01/2022. Pertinent problems: James [name] Narrowing of intervertebral disc space; Chronic low back pain (1ry area of Pain) (Bilateral) (R>L); Lumbar spondylosis; Chronic neck pain (Right); Cervical spondylosis (C7 intravertebral body cyst); Chronic cervical radicular pain (Right); Chronic lumbar radicular pain (Bilateral) (L>R) (L5 Dermatome); Osteoarthritis; Chronic hip pain; Chronic knee pain (Bilateral) (R>L); Complex regional pain syndrome type II of upper extremity (Right); Chronic upper extremity pain (Right); 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); Epidural fibrosis; Chronic musculoskeletal pain; Neurogenic pain; Neuropathic pain; Chronic sacroiliac joint pain (Bilateral)  (R>L); Psoriatic arthritis ([place]); Chronic lower extremity pain (2ry area of Pain) (Bilateral) (L>R); Psoriasis with arthropathy ([place]); Numbness and tingling of both legs; Abnormal nerve conduction studies (06/20/16); Chronic pain syndrome; Perineal pain; Chest pain with high risk for cardiac etiology; Chronic upper extremity pain(R>L); Cervicalgia; DDD (degenerative disc disease), cervical; Closed fracture of lumbar vertebral body ([place]) (L2); Neurogenic bladder s/p L2 fracture (04/19/2020); Lumbosacral radiculopathy at L5 (Right); Compression fracture of L2 lumbar vertebra, sequela; Abnormal MRI, cervical spine (09/19/2018); Trigger finger, ring finger (Left); Occipital neuralgia (midline); Trigger finger, ring finger (Right); and Trigger finger, middle finger (Right) on their pertinent problem list. Pain Assessment: Severity of   is reported as a  /10. Location:    / . Onset:  . Quality:  . Timing:  . Modifying factor(s):  Marland Kitchen Vitals:  vitals were not taken for this visit.  BMI: Estimated body mass index is 37.66 kg/m as calculated from the following:   Height as of 09/17/22: '5\' 11"'$  (1.803 m).   Weight as of 09/17/22: 270 lb (122.5 kg).  Reason for encounter: medication management. ***  R 03/19/2023   Pharmacotherapy Assessment  Analgesic: Oxycodone IR 20 mg, 1 tab PO q 8 hrs (60 mg/day of oxycodone) MME/day: 120 mg/day.   Monitoring: [place] PMP: PDMP reviewed during this encounter.       Pharmacotherapy: No side-effects or adverse reactions reported. Compliance: No problems identified. Effectiveness: Clinically acceptable.  No notes on file  No results found for: "CBDTHCR" No results found for: "D8THCCBX" No results found for: "D9THCCBX"  UDS:  Summary  Date Value Ref Range Status  03/13/2022 Note  Final    Comment:    ==================================================================== [employer] (MW) ==================================================================== Test  Result       Flag       Units  Drug Present and Declared for Prescription Verification   Oxycodone                      3181         EXPECTED   ng/mg creat   Oxymorphone                    >3367        EXPECTED   ng/mg creat   Noroxycodone                   >3367        EXPECTED   ng/mg creat   Noroxymorphone                 929          EXPECTED   ng/mg creat    Sources of oxycodone are scheduled prescription medications.    Oxymorphone, noroxycodone, and noroxymorphone are expected    metabolites of oxycodone. Oxymorphone is also available as a    scheduled prescription medication.  ==================================================================== Test                      Result    Flag   Units      Ref Range   Creatinine              297              mg/dL      >=20 ==================================================================== Declared Medications:  The flagging and interpretation on this report are based on the  following declared medications.  Unexpected results may arise from  inaccuracies in the declared medications.   **Note: The testing scope of this panel includes these medications:   Oxycodone   **Note: The testing scope of this panel does not include the  following reported medications:   Albuterol (Ventolin HFA)  Cholecalciferol  Levothyroxine (Synthroid)  Loratadine (Claritin)  Metoprolol (Lopressor)  Multivitamin  Naloxone (Narcan)  Tizanidine (Zanaflex)  Vitamin B  Vitamin D2 (Ergocalciferol) ==================================================================== For clinical consultation, please call [phone]. ====================================================================       ROS  Constitutional: Denies any fever or chills Gastrointestinal: No reported hemesis, hematochezia, vomiting, or acute GI distress Musculoskeletal: Denies any acute onset joint swelling, redness, loss of ROM, or weakness Neurological:  No reported episodes of acute onset apraxia, aphasia, dysarthria, agnosia, amnesia, paralysis, loss of coordination, or loss of consciousness  Medication Review  Cholecalciferol, Ergocalciferol, Oxycodone HCl, albuterol, b complex vitamins, levothyroxine, loratadine, metoprolol succinate, multivitamin, naloxone, and tiZANidine  History Review  Allergy: James [name] is allergic to gabapentin, penicillins, lactose, nsaids, talwin [pentazocine], codeine, procaine, and sulfa antibiotics. Drug: James [name]  reports no history of drug use. Alcohol:  reports no history of alcohol use. Tobacco:  reports that he [name] quit smoking. His smoking use included cigarettes. His smokeless tobacco use includes chew. Social: James [name]  reports that he [name] quit smoking. His smoking use included cigarettes. His smokeless tobacco use includes chew. He reports that he does not drink alcohol and does not use drugs. Medical:  [name] a past medical history of Allergy, Anemia, Arthritis, Asthma, Blood transfusion without reported diagnosis, Bronchitis, Bursitis, Cancer ([place]), Cataract, 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09/13/2015), Emphysema of lung ([place]), Gastritis, GERD (gastroesophageal reflux disease), Heart murmur, Hepatitis C, Hiatal hernia, Hypertension, Hypothyroidism, Kidney stone,  Lupus ([place]), Obesity, Osteoporosis, Peripheral nerve disease, Psoriatic arthritis ([place]), Sleep apnea, Supraventricular tachycardia, Tendonitis, and Thyroid disease. Surgical: James [name]  [name] a past surgical history that includes Spine surgery; Eye surgery; Fracture surgery (Right); Fracture surgery (Bilateral); Fracture surgery (Bilateral); Joint replacement (Right); Spinal cord stimulator implant; Spinal cord stimulator removal; Patella arthroplasty; Shoulder arthroscopy (Bilateral); Esophagogastroduodenoscopy (egd) with propofol (N/A, 01/20/2016); and Colonoscopy (2009). Family: family history includes  Arthritis in his father and mother; COPD in his father; Cancer in his father and mother; Diabetes in his mother; Hematuria in his father and mother; Hypertension in his father and mother; Kidney disease in his mother; Kidney failure in his mother; Prostate cancer in his father; Stroke in his mother.  Laboratory Chemistry Profile   Renal Lab Results  Component Value Date   BUN 15 08/19/2019   CREATININE 1.47 (H) 08/19/2019   GFRAA 59 (L) 08/19/2019   GFRNONAA 51 (L) 08/19/2019    Hepatic Lab Results  Component Value Date   AST 25 08/19/2019   ALT 24 08/19/2019   ALBUMIN 4.2 08/19/2019   ALKPHOS 47 08/19/2019    Electrolytes Lab Results  Component Value Date   NA 138 08/19/2019   K 4.1 08/19/2019   CL 100 08/19/2019   CALCIUM 9.3 08/19/2019   MG 2.1 08/19/2019    Bone Lab Results  Component Value Date   VD25OH 26.19 (L) 08/19/2019   25OHVITD1 42 12/06/2016   25OHVITD2 <1.0 12/06/2016   25OHVITD3 42 12/06/2016   TESTOSTERONE 285 02/12/2018    Inflammation (CRP: Acute Phase) (ESR: Chronic Phase) Lab Results  Component Value Date   CRP <0.8 08/19/2019   ESRSEDRATE 1 08/19/2019         Note: Above Lab results reviewed.  Recent Imaging Review  MR CERVICAL SPINE WO CONTRAST CLINICAL DATA:  Neck pain worse on the right with radiculopathy. Numbness in the arms and hands with worsening symptoms in the past 6 months.  EXAM: MRI CERVICAL SPINE WITHOUT CONTRAST  TECHNIQUE: Multiplanar, multisequence MR imaging of the cervical spine was performed. No intravenous contrast was administered.  COMPARISON:  12/13/2016  FINDINGS: Alignment: Chronic cervical spine straightening. Unchanged minimal anterolisthesis of C3 on C4.  Vertebrae: No fracture or suspicious osseous lesion. Chronic degenerative endplate changes at D9-2 with decreased degenerative endplate edema.  Cord: Normal signal.  Posterior Fossa, vertebral arteries, paraspinal tissues: Unremarkable.  Disc  levels:  C2-3: Mild right and moderate to severe left facet arthrosis without stenosis, unchanged.  C3-4: Central disc protrusion, uncovertebral spurring, and severe right and moderate left facet arthrosis result in borderline spinal stenosis with slight ventral cord flattening and moderate right neural foraminal stenosis, unchanged.  C4-5: Minimal disc bulging, mild uncovertebral spurring, and moderate right facet arthrosis without stenosis, unchanged.  C5-6: Moderate disc space narrowing. Disc bulging, uncovertebral spurring, and moderate right and mild left facet arthrosis result in mild spinal stenosis and moderate right greater than left neural foraminal stenosis, unchanged.  C6-7: Severe disc space narrowing. Broad-based posterior disc osteophyte complex results in mild spinal stenosis and mild-to-moderate right and borderline left neural foraminal stenosis, unchanged.  C7-T1: Minimal disc bulging and moderate to severe right and mild left facet arthrosis result in mild right neural foraminal stenosis without spinal stenosis, unchanged.  IMPRESSION: 1. Decreased degenerative endplate edema at E2-6. 2. Otherwise unchanged appearance of the cervical spine with up to mild spinal and moderate neural foraminal stenosis at multiple levels as above.  Electronically Signed   By: Seymour Bars.D.  On: 09/19/2018 10:30 Note: Reviewed        Physical Exam  General appearance: Well nourished, well developed, and well hydrated. In no apparent acute distress Mental status: Alert, oriented x 3 (person, place, & time)       Respiratory: No evidence of acute respiratory distress Eyes: PERLA Vitals: There were no vitals taken for this visit. BMI: Estimated body mass index is 37.66 kg/m as calculated from the following:   Height as of 09/17/22: '5\' 11"'$  (1.803 m).   Weight as of 09/17/22: 270 lb (122.5 kg). Ideal: Patient weight not recorded  Assessment   Diagnosis Status  1.  Cervicalgia (Right)   2. Cervical spondylosis (C7 intravertebral body cyst)   3. Chronic pain syndrome   4. Pharmacologic therapy   5. Chronic knee pain (Bilateral) (R>L)   6. Encounter for medication management   7. 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)   8. Encounter for chronic pain management   9. Chronic cervical radicular pain (Right)   10. Chronic use of opiate for therapeutic purpose   11. Chronic lower extremity pain (2ry area of Pain) (Bilateral) (L>R)   12. Chronic low back pain (1ry area of Pain) (Bilateral) (R>L)    Controlled Controlled Controlled   Updated Problems: No problems updated.  Plan of Care  Problem-specific:  No problem-specific Assessment & Plan notes found for this encounter.  James [name] [name] a current medication list which includes the following long-term medication(s): albuterol, naloxone, oxycodone hcl, and tizanidine.  Pharmacotherapy (Medications Ordered): No orders of the defined types were placed in this encounter.  Orders:  No orders of the defined types were placed in this encounter.  Follow-up plan:   No follow-ups on file.     Interventional Therapies  Risks  Complex Considerations:   A-fib; type 2 diabetes; low T; OSA; aortic valve insufficiency; opiate use (120 MME/day)   Planned  Pending:      Under consideration:      Completed:   Therapeutic left ring trigger finger + right middle trigger finger inj. x1 (09/17/2022) (100/100/100/L:30R:90)  Therapeutic right L4-5 LESI x1 (06/26/2016) (50/50/25)  Therapeutic bilateral lumbar facet MBB x2 (04/15/2017) (90/90/50/50)  Therapeutic left lumbar facet RFA x1 (07/18/17) (90/90/90/90)    Completed by other providers:   Lumbar spine surgery (01/23/2022). EMG & NCV (06/20/2016) by Gurney Maxin, MD [facility] neurology) Dx: Generalized LE polyneuropathy + superimposed right lumbosacral radiculopathy   Therapeutic  Palliative (PRN) options:   Palliative  right CESI  Palliative right L4-5 LESI #2   Palliative bilateral lumbar facet MBB  Palliative left lumbar facet RFA #2  Palliative bilateral cervical facet MBB    Pharmacological Recommendations:   Nonopioids transferred 08/31/2020: Zanaflex       Recent Visits Date Type Provider Dept  10/01/22 Office Visit Milinda Pointer, MD [facility]  09/17/22 Office Visit Milinda Pointer, MD [facility]  Showing recent visits within past 90 days and meeting all other requirements Future Appointments Date Type Provider Dept  12/12/22 Appointment Milinda Pointer, MD [facility]  Showing future appointments within next 90 days and meeting all other requirements  I discussed the assessment and treatment plan with the patient. The patient was provided an opportunity to ask questions and all were answered. The patient agreed with the plan and demonstrated an understanding of the instructions.  Patient advised to call back or seek an in-person evaluation if the symptoms or condition worsens.  Duration of  encounter: *** minutes.  Total time on encounter, as per AMA guidelines included both the face-to-face and non-face-to-face time personally spent by the physician and/or other qualified health care professional(s) on the day of the encounter (includes time in activities that require the physician or other qualified health care professional and does not include time in activities normally performed by clinical staff). Physician's time may include the following activities when performed: Preparing to see the patient (e.g., pre-charting review of records, searching for previously ordered imaging, lab work, and nerve conduction tests) Review of prior analgesic pharmacotherapies. Reviewing PMP Interpreting ordered tests (e.g., lab work, imaging, nerve conduction tests) Performing post-procedure evaluations, including interpretation of diagnostic procedures Obtaining  and/or reviewing separately obtained history Performing a medically appropriate examination and/or evaluation Counseling and educating the patient/family/caregiver Ordering medications, tests, or procedures Referring and communicating with other health care professionals (when not separately reported) Documenting clinical information in the electronic or other health record Independently interpreting results (not separately reported) and communicating results to the patient/ family/caregiver Care coordination (not separately reported)  Note by: Gaspar Cola, MD Date: 12/12/2022; Time: 3:49 PM

## 2022-12-12 ENCOUNTER — Ambulatory Visit: Payer: Medicare Other | Attending: Pain Medicine | Admitting: Pain Medicine

## 2022-12-12 ENCOUNTER — Encounter: Payer: Self-pay | Admitting: Pain Medicine

## 2022-12-12 VITALS — BP 135/86 | HR 87 | Temp 97.8°F | Ht 72.0 in | Wt 270.0 lb

## 2022-12-12 DIAGNOSIS — Z79891 Long term (current) use of opiate analgesic: Secondary | ICD-10-CM | POA: Diagnosis present

## 2022-12-12 DIAGNOSIS — M47812 Spondylosis without myelopathy or radiculopathy, cervical region: Secondary | ICD-10-CM | POA: Diagnosis present

## 2022-12-12 DIAGNOSIS — M25562 Pain in left knee: Secondary | ICD-10-CM | POA: Insufficient documentation

## 2022-12-12 DIAGNOSIS — M961 Postlaminectomy syndrome, not elsewhere classified: Secondary | ICD-10-CM | POA: Insufficient documentation

## 2022-12-12 DIAGNOSIS — G8929 Other chronic pain: Secondary | ICD-10-CM | POA: Diagnosis present

## 2022-12-12 DIAGNOSIS — M545 Low back pain, unspecified: Secondary | ICD-10-CM | POA: Diagnosis not present

## 2022-12-12 DIAGNOSIS — M542 Cervicalgia: Secondary | ICD-10-CM | POA: Diagnosis present

## 2022-12-12 DIAGNOSIS — M5412 Radiculopathy, cervical region: Secondary | ICD-10-CM | POA: Insufficient documentation

## 2022-12-12 DIAGNOSIS — M21371 Foot drop, right foot: Secondary | ICD-10-CM | POA: Insufficient documentation

## 2022-12-12 DIAGNOSIS — S32020S Wedge compression fracture of second lumbar vertebra, sequela: Secondary | ICD-10-CM | POA: Diagnosis present

## 2022-12-12 DIAGNOSIS — M79605 Pain in left leg: Secondary | ICD-10-CM | POA: Insufficient documentation

## 2022-12-12 DIAGNOSIS — G894 Chronic pain syndrome: Secondary | ICD-10-CM | POA: Diagnosis present

## 2022-12-12 DIAGNOSIS — M79604 Pain in right leg: Secondary | ICD-10-CM | POA: Diagnosis present

## 2022-12-12 DIAGNOSIS — M25561 Pain in right knee: Secondary | ICD-10-CM | POA: Diagnosis present

## 2022-12-12 DIAGNOSIS — M5417 Radiculopathy, lumbosacral region: Secondary | ICD-10-CM | POA: Insufficient documentation

## 2022-12-12 DIAGNOSIS — Z79899 Other long term (current) drug therapy: Secondary | ICD-10-CM | POA: Diagnosis present

## 2022-12-12 MED ORDER — OXYCODONE HCL 20 MG PO TABS: 20.0000 mg | ORAL_TABLET | Freq: Three times a day (TID) | ORAL | 0 refills | Status: DC | PRN

## 2022-12-12 NOTE — Progress Notes (Signed)
Nursing Pain Medication Assessment:  Safety precautions to be maintained throughout the outpatient stay will include: orient to surroundings, keep bed in low position, maintain call bell within reach at all times, provide assistance with transfer out of bed and ambulation.  Medication Inspection Compliance: Pill count conducted under aseptic conditions, in front of the patient. Neither the pills nor the bottle was removed from the patient's sight at any time. Once count was completed pills were immediately returned to the patient in their original bottle.  Medication: Oxycodone IR Pill/Patch Count:  21 of 90 pills remain Pill/Patch Appearance: Markings consistent with prescribed medication Bottle Appearance: Standard pharmacy container. Clearly labeled. Filled Date: 1 / 59 / 2024 Last Medication intake:  TodaySafety precautions to be maintained throughout the outpatient stay will include: orient to surroundings, keep bed in low position, maintain call bell within reach at all times, provide assistance with transfer out of bed and ambulation.

## 2022-12-12 NOTE — Patient Instructions (Signed)
____________________________________________________________________________________________  Patient Information update  To: All of our patients.  Re: Name change.  It has been made official that our current name, "[facility]"   will soon be changed to "[facility]".   The purpose of this change is to eliminate any confusion created by the concept of our practice being a "Medication Management Pain Clinic". In the past this has led to the misconception that we treat pain primarily by the use of prescription medications.  Nothing can be farther from the truth.   Understanding PAIN MANAGEMENT: To further understand what our practice does, you first have to understand that "Pain Management" is a subspecialty that requires additional training once a physician has completed their specialty training, which can be in either Anesthesia, Neurology, Psychiatry, or Physical Medicine and Rehabilitation (PMR). Each one of these contributes to the final approach taken by each physician to the management of their patient's pain. To be a "Pain Management Specialist" you must have first completed one of the specialty trainings below.  Anesthesiologists - trained in clinical pharmacology and interventional techniques such as nerve blockade and regional as well as central neuroanatomy. They are trained to block pain before, during, and after surgical interventions.  Neurologists - trained in the diagnosis and pharmacological treatment of complex neurological conditions, such as Multiple Sclerosis, Parkinson's, spinal cord injuries, and other systemic conditions that may be associated with symptoms that may include but are not limited to pain. They tend to rely primarily on the treatment of chronic pain using prescription medications.  Psychiatrist - trained in conditions affecting the psychosocial  wellbeing of patients including but not limited to depression, anxiety, schizophrenia, personality disorders, addiction, and other substance use disorders that may be associated with chronic pain. They tend to rely primarily on the treatment of chronic pain using prescription medications.   Physical Medicine and Rehabilitation (PMR) physicians, also known as physiatrists - trained to treat a wide variety of medical conditions affecting the brain, spinal cord, nerves, bones, joints, ligaments, muscles, and tendons. Their training is primarily aimed at treating patients that have suffered injuries that have caused severe physical impairment. Their training is primarily aimed at the physical therapy and rehabilitation of those patients. They may also work alongside orthopedic surgeons or neurosurgeons using their expertise in assisting surgical patients to recover after their surgeries.  INTERVENTIONAL PAIN MANAGEMENT is sub-subspecialty of Pain Management.  Our physicians are Board-certified in Anesthesia, Pain Management, and Interventional Pain Management.  This meaning that not only have they been trained and Board-certified in their specialty of Anesthesia, and subspecialty of Pain Management, but they have also received further training in the sub-subspecialty of Interventional Pain Management, in order to become Board-certified as INTERVENTIONAL PAIN MANAGEMENT SPECIALIST.    Mission: Our goal is to use our skills in  INTERVENTIONAL PAIN MANAGEMENT as alternatives to the chronic use of prescription opioid medications for the treatment of pain. To make this more clear, we have changed our name to reflect what we do and offer. We will continue to offer medication management assessment and recommendations, but we will not be taking over any patient's medication management.  ____________________________________________________________________________________________      ____________________________________________________________________________________________  Opioid Pain Medication Update  To: All patients taking opioid pain medications. (I.e.: hydrocodone, hydromorphone, oxycodone, oxymorphone, morphine, codeine, methadone, tapentadol, tramadol, buprenorphine, fentanyl, etc.)  Re: Updated review of side effects and adverse reactions of opioid analgesics, as well as new   information about long term effects of this class of medications.  Direct risks of long-term opioid therapy are not limited to opioid addiction and overdose. Potential medical risks include serious fractures, breathing problems during sleep, hyperalgesia, immunosuppression, chronic constipation, bowel obstruction, myocardial infarction, and tooth decay secondary to xerostomia.  Unpredictable adverse effects that can occur even if you take your medication correctly: Cognitive impairment, respiratory depression, and death. Most people think that if they take their medication "correctly", and "as instructed", that they will be safe. Nothing could be farther from the truth. In reality, a significant amount of recorded deaths associated with the use of opioids has occurred in individuals that had taken the medication for a long time, and were taking their medication correctly. The following are examples of how this can happen: Patient taking his/her medication for a long time, as instructed, without any side effects, is given a certain antibiotic or another unrelated medication, which in turn triggers a "Drug-to-drug interaction" leading to disorientation, cognitive impairment, impaired reflexes, respiratory depression or an untoward event leading to serious bodily harm or injury, including death.  Patient taking his/her medication for a long time, as instructed, without any side effects, develops an acute impairment of liver and/or kidney function. This will lead to a rapid inability of the body to  breakdown and eliminate their pain medication, which will result in effects similar to an "overdose", but with the same medicine and dose that they had always taken. This again may lead to disorientation, cognitive impairment, impaired reflexes, respiratory depression or an untoward event leading to serious bodily harm or injury, including death.  A similar problem will occur with patients as they grow older and their liver and kidney function begins to decrease as part of the aging process.  Background information: Historically, the original case for using long-term opioid therapy to treat chronic noncancer pain was based on safety assumptions that subsequent experience has called into question. In 1996, the American Pain Society and the American Academy of Pain Medicine issued a consensus statement supporting long-term opioid therapy. This statement acknowledged the dangers of opioid prescribing but concluded that the risk for addiction was low; respiratory depression induced by opioids was short-lived, occurred mainly in opioid-naive patients, and was antagonized by pain; tolerance was not a common problem; and efforts to control diversion should not constrain opioid prescribing. This has now proven to be wrong. Experience regarding the risks for opioid addiction, misuse, and overdose in community practice has failed to support these assumptions.  According to the Centers for Disease Control and Prevention, fatal overdoses involving opioid analgesics have increased sharply over the past decade. Currently, more than 96,700 people die from drug overdoses every year. Opioids are a factor in 7 out of every 10 overdose deaths. Deaths from drug overdose have surpassed motor vehicle accidents as the leading cause of death for individuals between the ages of 35 and 54.  Clinical data suggest that neuroendocrine dysfunction may be very common in both men and women, potentially causing hypogonadism, erectile  dysfunction, infertility, decreased libido, osteoporosis, and depression. Recent studies linked higher opioid dose to increased opioid-related mortality. Controlled observational studies reported that long-term opioid therapy may be associated with increased risk for cardiovascular events. Subsequent meta-analysis concluded that the safety of long-term opioid therapy in elderly patients has not been proven.   Side Effects and adverse reactions: Common side effects: Drowsiness (sedation). Dizziness. Nausea and vomiting. Constipation. Physical dependence -- Dependence often manifests with withdrawal symptoms when opioids are discontinued   or decreased. Tolerance -- As you take repeated doses of opioids, you require increased medication to experience the same effect of pain relief. Respiratory depression -- This can occur in healthy people, especially with higher doses. However, people with COPD, asthma or other lung conditions may be even more susceptible to fatal respiratory impairment.  Uncommon side effects: An increased sensitivity to feeling pain and extreme response to pain (hyperalgesia). Chronic use of opioids can lead to this. Delayed gastric emptying (the process by which the contents of your stomach are moved into your small intestine). Muscle rigidity. Immune system and hormonal dysfunction. Quick, involuntary muscle jerks (myoclonus). Arrhythmia. Itchy skin (pruritus). Dry mouth (xerostomia).  Long-term side effects: Chronic constipation. Sleep-disordered breathing (SDB). Increased risk of bone fractures. Hypothalamic-pituitary-adrenal dysregulation. Increased risk of overdose.  RISKS: Fractures and Falls:  Opioids increase the risk and incidence of falls. This is of particular importance in elderly patients.  Endocrine System:  Long-term administration is associated with endocrine abnormalities. Influences on both the hypothalamic-pituitary-adrenal axis?and the  hypothalamic-pituitary-gonadal axis have been demonstrated with consequent hypogonadism and adrenal insufficiency in both sexes. Hypogonadism and decreased levels of dehydroepiandrosterone sulfate have been reported in men and women. Endocrine effects can lead to: Amenorrhoea in women Reduced libido in both sexes Erectile dysfunction in men Infertility Depression and fatigue Patients (particularly women of childbearing age) should avoid opioids. There is insufficient evidence to recommend routine monitoring of asymptomatic patients taking opioids in the long-term for hormonal deficiencies.  Immune System: Human studies have demonstrated that opioids have an immunomodulating effect. These effects are mediated via opioid receptors both on immune effector cells and in the central nervous system. Opioids have been demonstrated to have adverse effects on antimicrobial response and anti-tumour surveillance. Buprenorphine has been demonstrated to have no impact on immune function.  Opioid Induced Hyperalgesia: Human studies have demonstrated that prolonged use of opioids can lead to a state of abnormal pain sensitivity, sometimes called opioid induced hyperalgesia (OIH). Opioid induced hyperalgesia is not usually seen in the absence of tolerance to opioid analgesia. Clinically, hyperalgesia may be diagnosed if the patient on long-term opioid therapy presents with increased pain. This might be qualitatively and anatomically distinct from pain related to disease progression or to breakthrough pain resulting from development of opioid tolerance. Pain associated with hyperalgesia tends to be more diffuse than the pre-existing pain and less defined in quality. Management of opioid induced hyperalgesia requires opioid dose reduction.  Cancer: Chronic opioid therapy has been associated with an increased risk of cancer among noncancer patients with chronic pain. This association was more evident in chronic  strong opioid users. Chronic opioid consumption causes significant pathological changes in the small intestine and colon. Epidemiological studies have found that there is a link between opium dependence and initiation of gastrointestinal cancers. Cancer is the second leading cause of death after cardiovascular disease. Chronic use of opioids can cause multiple conditions such as GERD, immunosuppression and renal damage as well as carcinogenic effects, which are associated with the incidence of cancers.   Mortality: Long-term opioid use has been associated with increased mortality among patients with chronic non-cancer pain (CNCP).  Prescription of long-acting opioids for chronic noncancer pain was associated with a significantly increased risk of all-cause mortality, including deaths from causes other than overdose.  Reference: Von Korff M, Kolodny A, Deyo RA, Chou R. Long-term opioid therapy reconsidered. Ann Intern Med. 2011 Sep 6;155(5):325-8. doi: 10.7326/[id number]. PMID: 21893626; PMCID: PMC3280085. Bedson J, Chen Y, Ashworth J, Hayward RA, Dunn KM,   Jordan KP. Risk of adverse events in patients prescribed long-term opioids: A cohort study in the [place] Clinical Practice Research Datalink. Eur J Pain. 2019 May;23(5):908-922. doi: 10.1002/ejp.1357. Epub 2019 Jan 31. PMID: 30620116. Colameco S, Coren JS, Ciervo CA. Continuous opioid treatment for chronic noncancer pain: a time for moderation in prescribing. Postgrad Med. 2009 Jul;121(4):61-6. doi: 10.3810/pgm.2009.07.2032. PMID: 19641271. Chou R, Turner JA, Devine EB, Hansen RN, Sullivan SD, Blazina I, Dana T, Bougatsos C, Deyo RA. The effectiveness and risks of long-term opioid therapy for chronic pain: a systematic review for a National Institutes of Health Pathways to Prevention Workshop. Ann Intern Med. 2015 Feb 17;162(4):276-86. doi: 10.7326/M14-2559. PMID: 25581257. Warner M, Chen LH, Makuc DM. NCHS Data Brief No. 22. [place]:  Centers for Disease Control and Prevention; 2009. Sep, Increase in Fatal Poisonings Involving Opioid Analgesics in the [place], 1999-2006. Song IA, Choi HR, Oh TK. Long-term opioid use and mortality in patients with chronic non-cancer pain: Ten-year follow-up study in [place] from 2010 through 2019. EClinicalMedicine. 2022 Jul 18;51:101558. doi: 10.1016/j.eclinm.2022.101558. PMID: 35875817; PMCID: PMC9304910. Huser, W., Schubert, T., Vogelmann, T. et al. All-cause mortality in patients with long-term opioid therapy compared with non-opioid analgesics for chronic non-cancer pain: a database study. BMC Med 18, 162 (2020). https://doi.org/10.1186/[email or web address] Rashidian H, Zendehdel K, Kamangar F, Malekzadeh R, Haghdoost AA. An Ecological Study of the Association between Opiate Use and Incidence of Cancers. Addict Health. 2016 Fall;8(4):252-260. PMID: 28819556; PMCID: PMC5554805.  Our Goal: Our goal is to control your pain with means other than the use of opioid pain medications.  Our Recommendation: Talk to your physician about coming off of these medications. We can assist you with the tapering down and stopping these medicines. Based on the new information, even if you cannot completely stop the medication, a decrease in the dose may be associated with a lesser risk. Ask for other means of controlling the pain. Decrease or eliminate those factors that significantly contribute to your pain such as smoking, obesity, and a diet heavily tilted towards "inflammatory" nutrients.  ____________________________________________________________________________________________     ____________________________________________________________________________________________  National Pain Medication Shortage  The [place] is experiencing worsening drug shortages. These have had a negative widespread effect on patient care and treatment. Not expected to improve any time soon. Predicted to last past  2029.   Drug shortage list (generic names) Oxycodone IR Oxycodone/APAP Oxymorphone IR Hydromorphone Hydrocodone/APAP Morphine  Where is the problem?  Manufacturing and supply level.  Will this shortage affect you?  Only if you take any of the above pain medications.  How? You may be unable to fill your prescription.  Your pharmacist may offer a "partial fill" of your prescription. (Warning: Do not accept partial fills.) Prescriptions partially filled cannot be transferred to another pharmacy. Read our Medication Rules and Regulation. Depending on how much medicine you are dependent on, you may experience withdrawals when unable to get the medication.  Recommendations: Consider ending your dependence on opioid pain medications. Ask your pain specialist to assist you with the process. Consider switching to a medication currently not in shortage, such as Buprenorphine. Talk to your pain specialist about this option. Consider decreasing your pain medication requirements by managing tolerance thru "Drug Holidays". This may help minimize withdrawals, should you run out of medicine. Control your pain thru the use of non-pharmacological interventional therapies.   Your prescriber: Prescribers cannot be blamed for shortages. Medication manufacturing and supply issues cannot be fixed by the prescriber.   NOTE: The prescriber is not responsible for supplying   the medication, or solving supply issues. Work with your pharmacist to solve it. The patient is responsible for the decision to take or continue taking the medication and for identifying and securing a legal supply source. By law, supplying the medication is the job and responsibility of the pharmacy. The prescriber is responsible for the evaluation, monitoring, and prescribing of these medications.   Prescribers will NOT: Re-issue prescriptions that have been partially filled. Re-issue prescriptions already sent to a pharmacy.  Re-send  prescriptions to a different pharmacy because yours did not have your medication. Ask pharmacist to order more medicine or transfer the prescription to another pharmacy. (Read below.)  New 2023 regulation: "July 06, 2022 Revised Regulation Allows DEA-Registered Pharmacies to Transfer Electronic Prescriptions at a Patient's Request DEA Headquarters Division - Public Information Office Patients now have the ability to request their electronic prescription be transferred to another pharmacy without having to go back to their practitioner to initiate the request. This revised regulation went into effect on Monday, July 02, 2022.     At a patient's request, a DEA-registered retail pharmacy can now transfer an electronic prescription for a controlled substance (schedules II-V) to another DEA-registered retail pharmacy. Prior to this change, patients would have to go through their practitioner to cancel their prescription and have it re-issued to a different pharmacy. The process was taxing and time consuming for both patients and practitioners.    The Drug Enforcement Administration (DEA) published its intent to revise the process for transferring electronic prescriptions on September 23, 2020.  The final rule was published in the federal register on May 31, 2022 and went into effect 30 days later.  Under the final rule, a prescription can only be transferred once between pharmacies, and only if allowed under existing state or other applicable law. The prescription must remain in its electronic form; may not be altered in any way; and the transfer must be communicated directly between two licensed pharmacists. It's important to note, any authorized refills transfer with the original prescription, which means the entire prescription will be filled at the same pharmacy".  Reference: https://www.dea.gov/stories/2023/2023-07/2022-09-01/revised-regulation-allows-dea-registered-pharmacies-transfer (DEA  website announcement)  https://www.govinfo.gov/content/pkg/FR-2022-05-31/pdf/2023-15847.pdf (Federal Register  Department of Justice)   Federal Register / Vol. 88, No. 143 / Thursday, May 31, 2022 / Rules and Regulations DEPARTMENT OF JUSTICE  Drug Enforcement Administration  21 CFR Part 1306  [Docket No. [id number]]  RIN 1117-AB64 Transfer of Electronic Prescriptions for Schedules II-V Controlled Substances Between Pharmacies for Initial Filling  ____________________________________________________________________________________________     _______________________________________________________________________  Medication Rules  Purpose: To inform patients, and their family members, of our medication rules and regulations.  Applies to: All patients receiving prescriptions from our practice (written or electronic).  Pharmacy of record: This is the pharmacy where your electronic prescriptions will be sent. Make sure we have the correct one.  Electronic prescriptions: In compliance with the [place] Strengthen Opioid Misuse Prevention (STOP) Act of 2017 (Session Law 2017-74/H243), effective November 05, 2018, all controlled substances must be electronically prescribed. Written prescriptions, faxing, or calling prescriptions to a pharmacy will no longer be done.  Prescription refills: These will be provided only during in-person appointments. No medications will be renewed without a "face-to-face" evaluation with your provider. Applies to all prescriptions.  NOTE: The following applies primarily to controlled substances (Opioid* Pain Medications).   Type of encounter (visit): For patients receiving controlled substances, face-to-face visits are required. (Not an option and not up to the patient.)  Patient's responsibilities: Pain Pills: Bring   all pain pills to every appointment (except for procedure appointments). Pill Bottles: Bring pills in original pharmacy bottle. Bring  bottle, even if empty. Always bring the bottle of the most recent fill.  Medication refills: You are responsible for knowing and keeping track of what medications you are taking and when is it that you will need a refill. The day before your appointment: write a list of all prescriptions that need to be refilled. The day of the appointment: give the list to the admitting nurse. Prescriptions will be written only during appointments. No prescriptions will be written on procedure days. If you forget a medication: it will not be "Called in", "Faxed", or "electronically sent". You will need to get another appointment to get these prescribed. No early refills. Do not call asking to have your prescription filled early. Partial  or short prescriptions: Occasionally your pharmacy may not have enough pills to fill your prescription.  NEVER ACCEPT a partial fill or a prescription that is short of the total amount of pills that you were prescribed.  With controlled substances the law allows 72 hours for the pharmacy to complete the prescription.  If the prescription is not completed within 72 hours, the pharmacist will require a new prescription to be written. This means that you will be short on your medicine and we WILL NOT send another prescription to complete your original prescription.  Instead, request the pharmacy to send a carrier to a nearby branch to get enough medication to provide you with your full prescription. Prescription Accuracy: You are responsible for carefully inspecting your prescriptions before leaving our office. Have the discharge nurse carefully go over each prescription with you, before taking them home. Make sure that your name is accurately spelled, that your address is correct. Check the name and dose of your medication to make sure it is accurate. Check the number of pills, and the written instructions to make sure they are clear and accurate. Make sure that you are given enough medication  to last until your next medication refill appointment. Taking Medication: Take medication as prescribed. When it comes to controlled substances, taking less pills or less frequently than prescribed is permitted and encouraged. Never take more pills than instructed. Never take the medication more frequently than prescribed.  Inform other Doctors: Always inform, all of your healthcare providers, of all the medications you take. Pain Medication from other Providers: You are not allowed to accept any additional pain medication from any other Doctor or Healthcare provider. There are two exceptions to this rule. (see below) In the event that you require additional pain medication, you are responsible for notifying us, as stated below. Cough Medicine: Often these contain an opioid, such as codeine or hydrocodone. Never accept or take cough medicine containing these opioids if you are already taking an opioid* medication. The combination may cause respiratory failure and death. Medication Agreement: You are responsible for carefully reading and following our Medication Agreement. This must be signed before receiving any prescriptions from our practice. Safely store a copy of your signed Agreement. Violations to the Agreement will result in no further prescriptions. (Additional copies of our Medication Agreement are available upon request.) Laws, Rules, & Regulations: All patients are expected to follow all Federal and State Laws, Statutes, Rules, & Regulations. Ignorance of the Laws does not constitute a valid excuse.  Illegal drugs and Controlled Substances: The use of illegal substances (including, but not limited to marijuana and its derivatives) and/or the illegal use of   any controlled substances is strictly prohibited. Violation of this rule may result in the immediate and permanent discontinuation of any and all prescriptions being written by our practice. The use of any illegal substances is  prohibited. Adopted CDC guidelines & recommendations: Target dosing levels will be at or below 60 MME/day. Use of benzodiazepines** is not recommended.  Exceptions: There are only two exceptions to the rule of not receiving pain medications from other Healthcare Providers. Exception #1 (Emergencies): In the event of an emergency (i.e.: accident requiring emergency care), you are allowed to receive additional pain medication. However, you are responsible for: As soon as you are able, call our office [phone], at any time of the day or night, and leave a message stating your name, the date and nature of the emergency, and the name and dose of the medication prescribed. In the event that your call is answered by a member of our staff, make sure to document and save the date, time, and the name of the person that took your information.  Exception #2 (Planned Surgery): In the event that you are scheduled by another doctor or dentist to have any type of surgery or procedure, you are allowed (for a period no longer than 30 days), to receive additional pain medication, for the acute post-op pain. However, in this case, you are responsible for picking up a copy of our "Post-op Pain Management for Surgeons" handout, and giving it to your surgeon or dentist. This document is available at our office, and does not require an appointment to obtain it. Simply go to our office during business hours (Monday-Thursday from 8:00 AM to 4:00 PM) (Friday 8:00 AM to 12:00 Noon) or if you have a scheduled appointment with us, prior to your surgery, and ask for it by name. In addition, you are responsible for: calling our office [phone], at any time of the day or night, and leaving a message stating your name, name of your surgeon, type of surgery, and date of procedure or surgery. Failure to comply with your responsibilities may result in termination of therapy involving the controlled substances. Medication Agreement  Violation. Following the above rules, including your responsibilities will help you in avoiding a Medication Agreement Violation ("Breaking your Pain Medication Contract").  Consequences:  Not following the above rules may result in permanent discontinuation of medication prescription therapy.  *Opioid medications include: morphine, codeine, oxycodone, oxymorphone, hydrocodone, hydromorphone, meperidine, tramadol, tapentadol, buprenorphine, fentanyl, methadone. **Benzodiazepine medications include: diazepam (Valium), alprazolam (Xanax), clonazepam (Klonopine), lorazepam (Ativan), clorazepate (Tranxene), chlordiazepoxide (Librium), estazolam (Prosom), oxazepam (Serax), temazepam (Restoril), triazolam (Halcion) (Last updated: 08/28/2022) ______________________________________________________________________    ______________________________________________________________________  Medication Recommendations and Reminders  Applies to: All patients receiving prescriptions (written and/or electronic).  Medication Rules & Regulations: You are responsible for reading, knowing, and following our "Medication Rules" document. These exist for your safety and that of others. They are not flexible and neither are we. Dismissing or ignoring them is an act of "non-compliance" that may result in complete and irreversible termination of such medication therapy. For safety reasons, "non-compliance" will not be tolerated. As with the [place] fundamental legal principle of "ignorance of the law is no defense", we will accept no excuses for not having read and knowing the content of documents provided to you by our practice.  Pharmacy of record:  Definition: This is the pharmacy where your electronic prescriptions will be sent.  We do not endorse any particular pharmacy. It is up to you and your insurance   to decide what pharmacy to use.  We do not restrict you in your choice of pharmacy. However, once we write for  your prescriptions, we will NOT be re-sending more prescriptions to fix restricted supply problems created by your pharmacy, or your insurance.  The pharmacy listed in the electronic medical record should be the one where you want electronic prescriptions to be sent. If you choose to change pharmacy, simply notify our nursing staff. Changes will be made only during your regular appointments and not over the phone.  Recommendations: Keep all of your pain medications in a safe place, under lock and key, even if you live alone. We will NOT replace lost, stolen, or damaged medication. We do not accept "Police Reports" as proof of medications having been stolen. After you fill your prescription, take 1 week's worth of pills and put them away in a safe place. You should keep a separate, properly labeled bottle for this purpose. The remainder should be kept in the original bottle. Use this as your primary supply, until it runs out. Once it's gone, then you know that you have 1 week's worth of medicine, and it is time to come in for a prescription refill. If you do this correctly, it is unlikely that you will ever run out of medicine. To make sure that the above recommendation works, it is very important that you make sure your medication refill appointments are scheduled at least 1 week before you run out of medicine. To do this in an effective manner, make sure that you do not leave the office without scheduling your next medication management appointment. Always ask the nursing staff to show you in your prescription , when your medication will be running out. Then arrange for the receptionist to get you a return appointment, at least 7 days before you run out of medicine. Do not wait until you have 1 or 2 pills left, to come in. This is very poor planning and does not take into consideration that we may need to cancel appointments due to bad weather, sickness, or emergencies affecting our staff. DO NOT ACCEPT A  "Partial Fill": If for any reason your pharmacy does not have enough pills/tablets to completely fill or refill your prescription, do not allow for a "partial fill". The law allows the pharmacy to complete that prescription within 72 hours, without requiring a new prescription. If they do not fill the rest of your prescription within those 72 hours, you will need a separate prescription to fill the remaining amount, which we will NOT provide. If the reason for the partial fill is your insurance, you will need to talk to the pharmacist about payment alternatives for the remaining tablets, but again, DO NOT ACCEPT A PARTIAL FILL, unless you can trust your pharmacist to obtain the remainder of the pills within 72 hours.  Prescription refills and/or changes in medication(s):  Prescription refills, and/or changes in dose or medication, will be conducted only during scheduled medication management appointments. (Applies to both, written and electronic prescriptions.) No refills on procedure days. No medication will be changed or started on procedure days. No changes, adjustments, and/or refills will be conducted on a procedure day. Doing so will interfere with the diagnostic portion of the procedure. No phone refills. No medications will be "called into the pharmacy". No Fax refills. No weekend refills. No Holliday refills. No after hours refills.  Remember:  Business hours are:  Monday to Thursday 8:00 AM to 4:00 PM Provider's Schedule: Mallory Schaad   Layne Dilauro, MD - Appointments are:  Medication management: Monday and Wednesday 8:00 AM to 4:00 PM Procedure day: Tuesday and Thursday 7:30 AM to 4:00 PM Bilal Lateef, MD - Appointments are:  Medication management: Tuesday and Thursday 8:00 AM to 4:00 PM Procedure day: Monday and Wednesday 7:30 AM to 4:00 PM (Last update: 08/28/2022) ______________________________________________________________________     ____________________________________________________________________________________________  Drug Holidays  What is a "Drug Holiday"? Drug Holiday: is the name given to the process of slowly tapering down and temporarily stopping the pain medication for the purpose of decreasing or eliminating tolerance to the drug.  Benefits Improved effectiveness Decreased required effective dose Improved pain control End dependence on high dose therapy Decrease cost of therapy Uncovering "opioid-induced hyperalgesia". (OIH)  What is "opioid hyperalgesia"? It is a paradoxical increase in pain caused by exposure to opioids. Stopping the opioid pain medication, contrary to the expected, it actually decreases or completely eliminates the pain. Ref.: "A comprehensive review of opioid-induced hyperalgesia". Marion Lee, et.al. Pain Physician. 2011 Mar-Apr;14(2):145-61.  What is tolerance? Tolerance: the progressive loss of effectiveness of a pain medicine due to repetitive use. A common problem of opioid pain medications.  How long should a "Drug Holiday" last? Effectiveness depends on the patient staying off all opioid pain medicines for a minimum of 14 consecutive days. (2 weeks)  How about just taking less of the medicine? Does not work. Will not accomplish goal of eliminating the excess receptors.  How about switching to a different pain medicine? (AKA. "Opioid rotation") Does not work. Creates the illusion of effectiveness by taking advantage of inaccurate equivalent dose calculations between different opioids. -This "technique" was promoted by studies funded by pharmaceutical companies, such as [employer], creators of "OxyContin".  Can I stop the medicine "cold turkey"? Depends. You should always coordinate with your Pain Specialist to make the transition as smoothly as possible. Avoid stopping the medicine abruptly without consulting. We recommend a "slow taper".  What is a slow  taper? Taper: refers to the gradual decrease in dose.   How do I stop/taper the dose? Slowly. Decrease the daily amount of pills that you take by one (1) pill every seven (7) days. This is called a "slow downward taper". Example: if you normally take four (4) pills per day, drop it to three (3) pills per day for seven (7) days, then to two (2) pills per day for seven (7) days, then to one (1) per day for seven (7) days, and then stop the medicine. The 14 day "Drug Holiday" starts on the first day without medicine.   Will I experience withdrawals? Unlikely with a slow taper.  What triggers withdrawals? Withdrawals are triggered by the sudden/abrupt stop of high dose opioids. Withdrawals can be avoided by slowly decreasing the dose over a prolonged period of time.  What are withdrawals? Symptoms associated with sudden/abrupt reduction/stopping of high-dose, long-term use of pain medication. Withdrawal are seldom seen on low dose therapy, or patients rarely taking opioid medication.  Early Withdrawal Symptoms may include: Agitation Anxiety Muscle aches Increased tearing Insomnia Runny nose Sweating Yawning  Late symptoms may include: Abdominal cramping Diarrhea Dilated pupils Goose bumps Nausea Vomiting  (Last update: 10/14/2022) ____________________________________________________________________________________________    ____________________________________________________________________________________________  WARNING: CBD (cannabidiol) & Delta (Delta-8 tetrahydrocannabinol) products.   Applicable to:  All individuals currently taking or considering taking CBD (cannabidiol) and, more important, all patients taking opioid analgesic controlled substances (pain medication). (Example: oxycodone; oxymorphone; hydrocodone; hydromorphone; morphine; methadone; tramadol; tapentadol; fentanyl; buprenorphine; butorphanol; dextromethorphan;   meperidine; codeine; etc.)  Introduction:   Recently there has been a drive towards the use of "natural" products for the treatment of different conditions, including pain anxiety and sleep disorders. Marijuana and hemp are two varieties of the cannabis genus plants. Marijuana and its derivatives are illegal, while hemp and its derivatives are not. Cannabidiol (CBD) and tetrahydrocannabinol (THC), are two natural compounds found in plants of the Cannabis genus. They can both be extracted from hemp or marijuana. Both compounds interact with your body's endocannabinoid system in very different ways. CBD is associated with pain relief (analgesia) while THC is associated with the psychoactive effects ("the high") obtained from the use of marijuana products. There are two main types of THC: Delta-9, which comes from the marijuana plant and it is illegal, and Delta-8, which comes from the hemp plant, and it is legal. (Both, Delta-9-THC and Delta-8-THC are psychoactive and give you "the high".)   Legality:  Marijuana and its derivatives: illegal Hemp and its derivatives: Legal (State dependent) UPDATE: (12/22/2021) The Drug Enforcement Agency (DEA) issued a letter stating that "delta" cannabinoids, including Delta-8-THCO and Delta-9-THCO, synthetically derived from hemp do not qualify as hemp and will be viewed as Schedule I drugs. (Schedule I drugs, substances, or chemicals are defined as drugs with no currently accepted medical use and a high potential for abuse. Some examples of Schedule I drugs are: heroin, lysergic acid diethylamide (LSD), marijuana (cannabis), 3,4-methylenedioxymethamphetamine (ecstasy), methaqualone, and peyote.) (https://www.dea.gov)  Legal status of CBD in [place]:  "Conditionally Legal"  Reference: "FDA Regulation of Cannabis and Cannabis-Derived Products, Including Cannabidiol (CBD)" - https://www.fda.gov/news-events/public-health-focus/fda-regulation-cannabis-and-cannabis-derived-products-including-cannabidiol-cbd  Warning:   CBD is not FDA approved and has not undergo the same manufacturing controls as prescription drugs.  This means that the purity and safety of available CBD may be questionable. Most of the time, despite manufacturer's claims, it is contaminated with THC (delta-9-tetrahydrocannabinol - the chemical in marijuana responsible for the "HIGH").  When this is the case, the THC contaminant will trigger a positive urine drug screen (UDS) test for Marijuana (carboxy-THC).   The FDA recently put out a warning about 5 things that everyone should be aware of regarding Delta-8 THC: Delta-8 THC products have not been evaluated or approved by the FDA for safe use and may be marketed in ways that put the public health at risk. The FDA has received adverse event reports involving delta-8 THC-containing products. Delta-8 THC has psychoactive and intoxicating effects. Delta-8 THC manufacturing often involve use of potentially harmful chemicals to create the concentrations of delta-8 THC claimed in the marketplace. The final delta-8 THC product may have potentially harmful by-products (contaminants) due to the chemicals used in the process. Manufacturing of delta-8 THC products may occur in uncontrolled or unsanitary settings, which may lead to the presence of unsafe contaminants or other potentially harmful substances. Delta-8 THC products should be kept out of the reach of children and pets.  NOTE: Because a positive UDS for any illicit substance is a violation of our medication agreement, your opioid analgesics (pain medicine) may be permanently discontinued.  MORE ABOUT CBD  General Information: CBD was discovered in 1940 and it is a derivative of the cannabis sativa genus plants (Marijuana and Hemp). It is one of the 113 identified substances found in Marijuana. It accounts for up to 40% of the plant's extract. As of 2018, preliminary clinical studies on CBD included research for the treatment of anxiety, movement  disorders, and pain. CBD is available and consumed in multiple forms, including   inhalation of smoke or vapor, as an aerosol spray, and by mouth. It may be supplied as an oil containing CBD, capsules, dried cannabis, or as a liquid solution. CBD is thought not to be as psychoactive as THC (delta-9-tetrahydrocannabinol - the chemical in marijuana responsible for the "HIGH"). Studies suggest that CBD may interact with different biological target receptors in the body, including cannabinoid and other neurotransmitter receptors. As of 2018 the mechanism of action for its biological effects has not been determined.  Side-effects  Adverse reactions: Dry mouth, diarrhea, decreased appetite, fatigue, drowsiness, malaise, weakness, sleep disturbances, and others.  Drug interactions:  CBD may interact with medications such as blood-thinners. CBD causes drowsiness on its own and it will increase drowsiness caused by other medications, including antihistamines (such as Benadryl), benzodiazepines (Xanax, Ativan, Valium), antipsychotics, antidepressants, opioids, alcohol and supplements such as kava, melatonin and St. John's Wort.  Other drug interactions: Brivaracetam (Briviact); Caffeine; Carbamazepine (Tegretol); Citalopram (Celexa); Clobazam (Onfi); Eslicarbazepine (Aptiom); Everolimus (Zostress); Lithium; Methadone (Dolophine); Rufinamide (Banzel); Sedative medications (CNS depressants); Sirolimus (Rapamune); Stiripentol (Diacomit); Tacrolimus (Prograf); Tamoxifen ; Soltamox); Topiramate (Topamax); Valproate; Warfarin (Coumadin); Zonisamide. (Last update: 10/15/2022) ____________________________________________________________________________________________   ____________________________________________________________________________________________  Naloxone Nasal Spray  Why am I receiving this medication? [place] STOP ACT requires that all patients taking high dose opioids or at risk of opioids  respiratory depression, be prescribed an opioid reversal agent, such as Naloxone (AKA: Narcan).  What is this medication? NALOXONE (nal OX one) treats opioid overdose, which causes slow or shallow breathing, severe drowsiness, or trouble staying awake. Call emergency services after using this medication. You may need additional treatment. Naloxone works by reversing the effects of opioids. It belongs to a group of medications called opioid blockers.  COMMON BRAND NAME(S): Kloxxado, Narcan  What should I tell my care team before I take this medication? They need to know if you have any of these conditions: Heart disease Substance use disorder An unusual or allergic reaction to naloxone, other medications, foods, dyes, or preservatives Pregnant or trying to get pregnant Breast-feeding  When to use this medication? This medication is to be used for the treatment of respiratory depression (less than 8 breaths per minute) secondary to opioid overdose.   How to use this medication? This medication is for use in the nose. Lay the person on their back. Support their neck with your hand and allow the head to tilt back before giving the medication. The nasal spray should be given into 1 nostril. After giving the medication, move the person onto their side. Do not remove or test the nasal spray until ready to use. Get emergency medical help right away after giving the first dose of this medication, even if the person wakes up. You should be familiar with how to recognize the signs and symptoms of a narcotic overdose. If more doses are needed, give the additional dose in the other nostril. Talk to your care team about the use of this medication in children. While this medication may be prescribed for children as young as newborns for selected conditions, precautions do apply.  Naloxone Overdosage: If you think you have taken too much of this medicine contact a poison control center or emergency room at  once.  NOTE: This medicine is only for you. Do not share this medicine with others.  What if I miss a dose? This does not apply.  What may interact with this medication? This is only used during an emergency. No interactions are expected during emergency use.   This list may not describe all possible interactions. Give your health care provider a list of all the medicines, herbs, non-prescription drugs, or dietary supplements you use. Also tell them if you smoke, drink alcohol, or use illegal drugs. Some items may interact with your medicine.  What should I watch for while using this medication? Keep this medication ready for use in the case of an opioid overdose. Make sure that you have the phone number of your care team and local hospital ready. You may need to have additional doses of this medication. Each nasal spray contains a single dose. Some emergencies may require additional doses. After use, bring the treated person to the nearest hospital or call 911. Make sure the treating care team knows that the person has received a dose of this medication. You will receive additional instructions on what to do during and after use of this medication before an emergency occurs.  What side effects may I notice from receiving this medication? Side effects that you should report to your care team as soon as possible: Allergic reactions--skin rash, itching, hives, swelling of the face, lips, tongue, or throat Side effects that usually do not require medical attention (report these to your care team if they continue or are bothersome): Constipation Dryness or irritation inside the nose Headache Increase in blood pressure Muscle spasms Stuffy nose Toothache This list may not describe all possible side effects. Call your doctor for medical advice about side effects. You may report side effects to FDA at 1-800-FDA-1088.  Where should I keep my medication? Because this is an emergency medication, you  should keep it with you at all times.  Keep out of the reach of children and pets. Store between 20 and 25 degrees C (68 and 77 degrees F). Do not freeze. Throw away any unused medication after the expiration date. Keep in original box until ready to use.  NOTE: This sheet is a summary. It may not cover all possible information. If you have questions about this medicine, talk to your doctor, pharmacist, or health care provider.   2023 Elsevier/Gold Standard (2021-06-30 00:00:00)  ____________________________________________________________________________________________   

## 2023-03-12 NOTE — Patient Instructions (Signed)
____________________________________________________________________________________________  Opioid Pain Medication Update  To: All patients taking opioid pain medications. (I.e.: hydrocodone, hydromorphone, oxycodone, oxymorphone, morphine, codeine, methadone, tapentadol, tramadol, buprenorphine, fentanyl, etc.)  Re: Updated review of side effects and adverse reactions of opioid analgesics, as well as new information about long term effects of this class of medications.  Direct risks of long-term opioid therapy are not limited to opioid addiction and overdose. Potential medical risks include serious fractures, breathing problems during sleep, hyperalgesia, immunosuppression, chronic constipation, bowel obstruction, myocardial infarction, and tooth decay secondary to xerostomia.  Unpredictable adverse effects that can occur even if you take your medication correctly: Cognitive impairment, respiratory depression, and death. Most people think that if they take their medication "correctly", and "as instructed", that they will be safe. Nothing could be farther from the truth. In reality, a significant amount of recorded deaths associated with the use of opioids has occurred in individuals that had taken the medication for a long time, and were taking their medication correctly. The following are examples of how this can happen: Patient taking his/her medication for a long time, as instructed, without any side effects, is given a certain antibiotic or another unrelated medication, which in turn triggers a "Drug-to-drug interaction" leading to disorientation, cognitive impairment, impaired reflexes, respiratory depression or an untoward event leading to serious bodily harm or injury, including death.  Patient taking his/her medication for a long time, as instructed, without any side effects, develops an acute impairment of liver and/or kidney function. This will lead to a rapid inability of the body to  breakdown and eliminate their pain medication, which will result in effects similar to an "overdose", but with the same medicine and dose that they had always taken. This again may lead to disorientation, cognitive impairment, impaired reflexes, respiratory depression or an untoward event leading to serious bodily harm or injury, including death.  A similar problem will occur with patients as they grow older and their liver and kidney function begins to decrease as part of the aging process.  Background information: Historically, the original case for using long-term opioid therapy to treat chronic noncancer pain was based on safety assumptions that subsequent experience has called into question. In 1996, the American Pain Society and the American Academy of Pain Medicine issued a consensus statement supporting long-term opioid therapy. This statement acknowledged the dangers of opioid prescribing but concluded that the risk for addiction was low; respiratory depression induced by opioids was short-lived, occurred mainly in opioid-naive patients, and was antagonized by pain; tolerance was not a common problem; and efforts to control diversion should not constrain opioid prescribing. This has now proven to be wrong. Experience regarding the risks for opioid addiction, misuse, and overdose in community practice has failed to support these assumptions.  According to the Centers for Disease Control and Prevention, fatal overdoses involving opioid analgesics have increased sharply over the past decade. Currently, more than 96,700 people die from drug overdoses every year. Opioids are a factor in 7 out of every 10 overdose deaths. Deaths from drug overdose have surpassed motor vehicle accidents as the leading cause of death for individuals between the ages of 35 and 54.  Clinical data suggest that neuroendocrine dysfunction may be very common in both men and women, potentially causing hypogonadism, erectile  dysfunction, infertility, decreased libido, osteoporosis, and depression. Recent studies linked higher opioid dose to increased opioid-related mortality. Controlled observational studies reported that long-term opioid therapy may be associated with increased risk for cardiovascular events. Subsequent meta-analysis concluded   that the safety of long-term opioid therapy in elderly patients has not been proven.   Side Effects and adverse reactions: Common side effects: Drowsiness (sedation). Dizziness. Nausea and vomiting. Constipation. Physical dependence -- Dependence often manifests with withdrawal symptoms when opioids are discontinued or decreased. Tolerance -- As you take repeated doses of opioids, you require increased medication to experience the same effect of pain relief. Respiratory depression -- This can occur in healthy people, especially with higher doses. However, people with COPD, asthma or other lung conditions may be even more susceptible to fatal respiratory impairment.  Uncommon side effects: An increased sensitivity to feeling pain and extreme response to pain (hyperalgesia). Chronic use of opioids can lead to this. Delayed gastric emptying (the process by which the contents of your stomach are moved into your small intestine). Muscle rigidity. Immune system and hormonal dysfunction. Quick, involuntary muscle jerks (myoclonus). Arrhythmia. Itchy skin (pruritus). Dry mouth (xerostomia).  Long-term side effects: Chronic constipation. Sleep-disordered breathing (SDB). Increased risk of bone fractures. Hypothalamic-pituitary-adrenal dysregulation. Increased risk of overdose.  RISKS: Fractures and Falls:  Opioids increase the risk and incidence of falls. This is of particular importance in elderly patients.  Endocrine System:  Long-term administration is associated with endocrine abnormalities (endocrinopathies). (Also known as Opioid-induced Endocrinopathy) Influences  on both the hypothalamic-pituitary-adrenal axis?and the hypothalamic-pituitary-gonadal axis have been demonstrated with consequent hypogonadism and adrenal insufficiency in both sexes. Hypogonadism and decreased levels of dehydroepiandrosterone sulfate have been reported in men and women. Endocrine effects include: Amenorrhoea in women (abnormal absence of menstruation) Reduced libido in both sexes Decreased sexual function Erectile dysfunction in men Hypogonadisms (decreased testicular function with shrinkage of testicles) Infertility Depression and fatigue Loss of muscle mass Anxiety Depression Immune suppression Hyperalgesia Weight gain Anemia Osteoporosis Patients (particularly women of childbearing age) should avoid opioids. There is insufficient evidence to recommend routine monitoring of asymptomatic patients taking opioids in the long-term for hormonal deficiencies.  Immune System: Human studies have demonstrated that opioids have an immunomodulating effect. These effects are mediated via opioid receptors both on immune effector cells and in the central nervous system. Opioids have been demonstrated to have adverse effects on antimicrobial response and anti-tumour surveillance. Buprenorphine has been demonstrated to have no impact on immune function.  Opioid Induced Hyperalgesia: Human studies have demonstrated that prolonged use of opioids can lead to a state of abnormal pain sensitivity, sometimes called opioid induced hyperalgesia (OIH). Opioid induced hyperalgesia is not usually seen in the absence of tolerance to opioid analgesia. Clinically, hyperalgesia may be diagnosed if the patient on long-term opioid therapy presents with increased pain. This might be qualitatively and anatomically distinct from pain related to disease progression or to breakthrough pain resulting from development of opioid tolerance. Pain associated with hyperalgesia tends to be more diffuse than the  pre-existing pain and less defined in quality. Management of opioid induced hyperalgesia requires opioid dose reduction.  Cancer: Chronic opioid therapy has been associated with an increased risk of cancer among noncancer patients with chronic pain. This association was more evident in chronic strong opioid users. Chronic opioid consumption causes significant pathological changes in the small intestine and colon. Epidemiological studies have found that there is a link between opium dependence and initiation of gastrointestinal cancers. Cancer is the second leading cause of death after cardiovascular disease. Chronic use of opioids can cause multiple conditions such as GERD, immunosuppression and renal damage as well as carcinogenic effects, which are associated with the incidence of cancers.   Mortality: Long-term opioid use   has been associated with increased mortality among patients with chronic non-cancer pain (CNCP).  Prescription of long-acting opioids for chronic noncancer pain was associated with a significantly increased risk of all-cause mortality, including deaths from causes other than overdose.  Reference: Von Korff M, Kolodny A, Deyo RA, Chou R. Long-term opioid therapy reconsidered. Ann Intern Med. 2011 Sep 6;155(5):325-8. doi: 10.7326/[id number]. PMID: 21893626; PMCID: PMC3280085. Bedson J, Chen Y, Ashworth J, Hayward RA, Dunn KM, Jordan KP. Risk of adverse events in patients prescribed long-term opioids: A cohort study in the [place] Clinical Practice Research Datalink. Eur J Pain. 2019 May;23(5):908-922. doi: 10.1002/ejp.1357. Epub 2019 Jan 31. PMID: 30620116. Colameco S, Coren JS, Ciervo CA. Continuous opioid treatment for chronic noncancer pain: a time for moderation in prescribing. Postgrad Med. 2009 Jul;121(4):61-6. doi: 10.3810/pgm.2009.07.2032. PMID: 19641271. Chou R, Turner JA, Devine EB, Hansen RN, Sullivan SD, Blazina I, Dana T, Bougatsos C, Deyo RA. The  effectiveness and risks of long-term opioid therapy for chronic pain: a systematic review for a National Institutes of Health Pathways to Prevention Workshop. Ann Intern Med. 2015 Feb 17;162(4):276-86. doi: 10.7326/M14-2559. PMID: 25581257. Warner M, Chen LH, Makuc DM. NCHS Data Brief No. 22. [place]: Centers for Disease Control and Prevention; 2009. Sep, Increase in Fatal Poisonings Involving Opioid Analgesics in the [place], 1999-2006. Song IA, Choi HR, Oh TK. Long-term opioid use and mortality in patients with chronic non-cancer pain: Ten-year follow-up study in [place] from 2010 through 2019. EClinicalMedicine. 2022 Jul 18;51:101558. doi: 10.1016/j.eclinm.2022.101558. PMID: 35875817; PMCID: PMC9304910. Huser, W., Schubert, T., Vogelmann, T. et al. All-cause mortality in patients with long-term opioid therapy compared with non-opioid analgesics for chronic non-cancer pain: a database study. BMC Med 18, 162 (2020). https://doi.org/10.1186/[email or web address] Rashidian H, Zendehdel K, Kamangar F, Malekzadeh R, Haghdoost AA. An Ecological Study of the Association between Opiate Use and Incidence of Cancers. Addict Health. 2016 Fall;8(4):252-260. PMID: 28819556; PMCID: PMC5554805.  Our Goal: Our goal is to control your pain with means other than the use of opioid pain medications.  Our Recommendation: Talk to your physician about coming off of these medications. We can assist you with the tapering down and stopping these medicines. Based on the new information, even if you cannot completely stop the medication, a decrease in the dose may be associated with a lesser risk. Ask for other means of controlling the pain. Decrease or eliminate those factors that significantly contribute to your pain such as smoking, obesity, and a diet heavily tilted towards "inflammatory" nutrients.  Last Updated: 01/02/2023    ____________________________________________________________________________________________     ____________________________________________________________________________________________  Patient Information update  To: All of our patients.  Re: Name change.  It has been made official that our current name, "[facility]"   will soon be changed to " [facility]".   The purpose of this change is to eliminate any confusion created by the concept of our practice being a "Medication Management Pain Clinic". In the past this has led to the misconception that we treat pain primarily by the use of prescription medications.  Nothing can be farther from the truth.   Understanding PAIN MANAGEMENT: To further understand what our practice does, you first have to understand that "Pain Management" is a subspecialty that requires additional training once a physician has completed their specialty training, which can be in either Anesthesia, Neurology, Psychiatry, or Physical Medicine and Rehabilitation (PMR). Each one of these contributes to the final approach taken by each physician to   the management of their patient's pain. To be a "Pain Management Specialist" you must have first completed one of the specialty trainings below.  Anesthesiologists - trained in clinical pharmacology and interventional techniques such as nerve blockade and regional as well as central neuroanatomy. They are trained to block pain before, during, and after surgical interventions.  Neurologists - trained in the diagnosis and pharmacological treatment of complex neurological conditions, such as Multiple Sclerosis, Parkinson's, spinal cord injuries, and other systemic conditions that may be associated with symptoms that may include but are not limited to pain. They tend to rely primarily on the treatment of chronic pain  using prescription medications.  Psychiatrist - trained in conditions affecting the psychosocial wellbeing of patients including but not limited to depression, anxiety, schizophrenia, personality disorders, addiction, and other substance use disorders that may be associated with chronic pain. They tend to rely primarily on the treatment of chronic pain using prescription medications.   Physical Medicine and Rehabilitation (PMR) physicians, also known as physiatrists - trained to treat a wide variety of medical conditions affecting the brain, spinal cord, nerves, bones, joints, ligaments, muscles, and tendons. Their training is primarily aimed at treating patients that have suffered injuries that have caused severe physical impairment. Their training is primarily aimed at the physical therapy and rehabilitation of those patients. They may also work alongside orthopedic surgeons or neurosurgeons using their expertise in assisting surgical patients to recover after their surgeries.  INTERVENTIONAL PAIN MANAGEMENT is sub-subspecialty of Pain Management.  Our physicians are Board-certified in Anesthesia, Pain Management, and Interventional Pain Management.  This meaning that not only have they been trained and Board-certified in their specialty of Anesthesia, and subspecialty of Pain Management, but they have also received further training in the sub-subspecialty of Interventional Pain Management, in order to become Board-certified as INTERVENTIONAL PAIN MANAGEMENT SPECIALIST.    Mission: Our goal is to use our skills in  INTERVENTIONAL PAIN MANAGEMENT as alternatives to the chronic use of prescription opioid medications for the treatment of pain. To make this more clear, we have changed our name to reflect what we do and offer. We will continue to offer medication management assessment and recommendations, but we will not be taking over any patient's medication  management.  ____________________________________________________________________________________________     ____________________________________________________________________________________________  National Pain Medication Shortage  The [place] is experiencing worsening drug shortages. These have had a negative widespread effect on patient care and treatment. Not expected to improve any time soon. Predicted to last past 2029.   Drug shortage list (generic names) Oxycodone IR Oxycodone/APAP Oxymorphone IR Hydromorphone Hydrocodone/APAP Morphine  Where is the problem?  Manufacturing and supply level.  Will this shortage affect you?  Only if you take any of the above pain medications.  How? You may be unable to fill your prescription.  Your pharmacist may offer a "partial fill" of your prescription. (Warning: Do not accept partial fills.) Prescriptions partially filled cannot be transferred to another pharmacy. Read our Medication Rules and Regulation. Depending on how much medicine you are dependent on, you may experience withdrawals when unable to get the medication.  Recommendations: Consider ending your dependence on opioid pain medications. Ask your pain specialist to assist you with the process. Consider switching to a medication currently not in shortage, such as Buprenorphine. Talk to your pain specialist about this option. Consider decreasing your pain medication requirements by managing tolerance thru "Drug Holidays". This may help minimize withdrawals, should you run out of medicine. Control your pain thru   the use of non-pharmacological interventional therapies.   Your prescriber: Prescribers cannot be blamed for shortages. Medication manufacturing and supply issues cannot be fixed by the prescriber.   NOTE: The prescriber is not responsible for supplying the medication, or solving supply issues. Work with your pharmacist to solve it. The patient is responsible for  the decision to take or continue taking the medication and for identifying and securing a legal supply source. By law, supplying the medication is the job and responsibility of the pharmacy. The prescriber is responsible for the evaluation, monitoring, and prescribing of these medications.   Prescribers will NOT: Re-issue prescriptions that have been partially filled. Re-issue prescriptions already sent to a pharmacy.  Re-send prescriptions to a different pharmacy because yours did not have your medication. Ask pharmacist to order more medicine or transfer the prescription to another pharmacy. (Read below.)  New 2023 regulation: "July 06, 2022 Revised Regulation Allows DEA-Registered Pharmacies to Transfer Electronic Prescriptions at a Patient's Request DEA Headquarters Division - Public Information Office Patients now have the ability to request their electronic prescription be transferred to another pharmacy without having to go back to their practitioner to initiate the request. This revised regulation went into effect on Monday, July 02, 2022.     At a patient's request, a DEA-registered retail pharmacy can now transfer an electronic prescription for a controlled substance (schedules II-V) to another DEA-registered retail pharmacy. Prior to this change, patients would have to go through their practitioner to cancel their prescription and have it re-issued to a different pharmacy. The process was taxing and time consuming for both patients and practitioners.    The Drug Enforcement Administration (DEA) published its intent to revise the process for transferring electronic prescriptions on September 23, 2020.  The final rule was published in the federal register on May 31, 2022 and went into effect 30 days later.  Under the final rule, a prescription can only be transferred once between pharmacies, and only if allowed under existing state or other applicable law. The prescription must  remain in its electronic form; may not be altered in any way; and the transfer must be communicated directly between two licensed pharmacists. It's important to note, any authorized refills transfer with the original prescription, which means the entire prescription will be filled at the same pharmacy".  Reference: https://www.dea.gov/stories/2023/2023-07/2022-09-01/revised-regulation-allows-dea-registered-pharmacies-transfer (DEA website announcement)  https://www.govinfo.gov/content/pkg/FR-2022-05-31/pdf/2023-15847.pdf (Federal Register  Department of Justice)   Federal Register / Vol. 88, No. 143 / Thursday, May 31, 2022 / Rules and Regulations DEPARTMENT OF JUSTICE  Drug Enforcement Administration  21 CFR Part 1306  [Docket No. [id number]]  RIN [id number]-AB64 Transfer of Electronic Prescriptions for Schedules II-V Controlled Substances Between Pharmacies for Initial Filling  ____________________________________________________________________________________________     ____________________________________________________________________________________________  Transfer of Pain Medication between Pharmacies  Re: 2023 DEA Clarification on existing regulation  Published on DEA Website: July 06, 2022  Title: Revised Regulation Allows DEA-Registered Pharmacies to Transfer Electronic Prescriptions at a Patient's Request DEA Headquarters Division - Public Information Office  "Patients now have the ability to request their electronic prescription be transferred to another pharmacy without having to go back to their practitioner to initiate the request. This revised regulation went into effect on Monday, July 02, 2022.     At a patient's request, a DEA-registered retail pharmacy can now transfer an electronic prescription for a controlled substance (schedules II-V) to another DEA-registered retail pharmacy. Prior to this change, patients would have to go through their practitioner to  cancel their prescription   and have it re-issued to a different pharmacy. The process was taxing and time consuming for both patients and practitioners.    The Drug Enforcement Administration (DEA) published its intent to revise the process for transferring electronic prescriptions on September 23, 2020.  The final rule was published in the federal register on May 31, 2022 and went into effect 30 days later.  Under the final rule, a prescription can only be transferred once between pharmacies, and only if allowed under existing state or other applicable law. The prescription must remain in its electronic form; may not be altered in any way; and the transfer must be communicated directly between two licensed pharmacists. It's important to note, any authorized refills transfer with the original prescription, which means the entire prescription will be filled at the same pharmacy."    REFERENCES: 1. DEA website announcement https://www.dea.gov/stories/2023/2023-07/2022-09-01/revised-regulation-allows-dea-registered-pharmacies-transfer  2. Department of Justice website  https://www.govinfo.gov/content/pkg/FR-2022-05-31/pdf/2023-15847.pdf  3. DEPARTMENT OF JUSTICE Drug Enforcement Administration 21 CFR Part 1306 [Docket No. [id number]] RIN [id number] "Transfer of Electronic Prescriptions for Schedules II-V Controlled Substances Between Pharmacies for Initial Filling"  ____________________________________________________________________________________________     _______________________________________________________________________  Medication Rules  Purpose: To inform patients, and their family members, of our medication rules and regulations.  Applies to: All patients receiving prescriptions from our practice (written or electronic).  Pharmacy of record: This is the pharmacy where your electronic prescriptions will be sent. Make sure we have the correct one.  Electronic prescriptions: In  compliance with the [place] Strengthen Opioid Misuse Prevention (STOP) Act of 2017 (Session Law 2017-74/H243), effective November 05, 2018, all controlled substances must be electronically prescribed. Written prescriptions, faxing, or calling prescriptions to a pharmacy will no longer be done.  Prescription refills: These will be provided only during in-person appointments. No medications will be renewed without a "face-to-face" evaluation with your provider. Applies to all prescriptions.  NOTE: The following applies primarily to controlled substances (Opioid* Pain Medications).   Type of encounter (visit): For patients receiving controlled substances, face-to-face visits are required. (Not an option and not up to the patient.)  Patient's responsibilities: Pain Pills: Bring all pain pills to every appointment (except for procedure appointments). Pill Bottles: Bring pills in original pharmacy bottle. Bring bottle, even if empty. Always bring the bottle of the most recent fill.  Medication refills: You are responsible for knowing and keeping track of what medications you are taking and when is it that you will need a refill. The day before your appointment: write a list of all prescriptions that need to be refilled. The day of the appointment: give the list to the admitting nurse. Prescriptions will be written only during appointments. No prescriptions will be written on procedure days. If you forget a medication: it will not be "Called in", "Faxed", or "electronically sent". You will need to get another appointment to get these prescribed. No early refills. Do not call asking to have your prescription filled early. Partial  or short prescriptions: Occasionally your pharmacy may not have enough pills to fill your prescription.  NEVER ACCEPT a partial fill or a prescription that is short of the total amount of pills that you were prescribed.  With controlled substances the law allows 72 hours for  the pharmacy to complete the prescription.  If the prescription is not completed within 72 hours, the pharmacist will require a new prescription to be written. This means that you will be short on your medicine and we WILL NOT send another prescription to complete your original   prescription.  Instead, request the pharmacy to send a carrier to a nearby branch to get enough medication to provide you with your full prescription. Prescription Accuracy: You are responsible for carefully inspecting your prescriptions before leaving our office. Have the discharge nurse carefully go over each prescription with you, before taking them home. Make sure that your name is accurately spelled, that your address is correct. Check the name and dose of your medication to make sure it is accurate. Check the number of pills, and the written instructions to make sure they are clear and accurate. Make sure that you are given enough medication to last until your next medication refill appointment. Taking Medication: Take medication as prescribed. When it comes to controlled substances, taking less pills or less frequently than prescribed is permitted and encouraged. Never take more pills than instructed. Never take the medication more frequently than prescribed.  Inform other Doctors: Always inform, all of your healthcare providers, of all the medications you take. Pain Medication from other Providers: You are not allowed to accept any additional pain medication from any other Doctor or Healthcare provider. There are two exceptions to this rule. (see below) In the event that you require additional pain medication, you are responsible for notifying us, as stated below. Cough Medicine: Often these contain an opioid, such as codeine or hydrocodone. Never accept or take cough medicine containing these opioids if you are already taking an opioid* medication. The combination may cause respiratory failure and death. Medication Agreement:  You are responsible for carefully reading and following our Medication Agreement. This must be signed before receiving any prescriptions from our practice. Safely store a copy of your signed Agreement. Violations to the Agreement will result in no further prescriptions. (Additional copies of our Medication Agreement are available upon request.) Laws, Rules, & Regulations: All patients are expected to follow all Federal and State Laws, Statutes, Rules, & Regulations. Ignorance of the Laws does not constitute a valid excuse.  Illegal drugs and Controlled Substances: The use of illegal substances (including, but not limited to marijuana and its derivatives) and/or the illegal use of any controlled substances is strictly prohibited. Violation of this rule may result in the immediate and permanent discontinuation of any and all prescriptions being written by our practice. The use of any illegal substances is prohibited. Adopted CDC guidelines & recommendations: Target dosing levels will be at or below 60 MME/day. Use of benzodiazepines** is not recommended.  Exceptions: There are only two exceptions to the rule of not receiving pain medications from other Healthcare Providers. Exception #1 (Emergencies): In the event of an emergency (i.e.: accident requiring emergency care), you are allowed to receive additional pain medication. However, you are responsible for: As soon as you are able, call our office [phone], at any time of the day or night, and leave a message stating your name, the date and nature of the emergency, and the name and dose of the medication prescribed. In the event that your call is answered by a member of our staff, make sure to document and save the date, time, and the name of the person that took your information.  Exception #2 (Planned Surgery): In the event that you are scheduled by another doctor or dentist to have any type of surgery or procedure, you are allowed (for a period no  longer than 30 days), to receive additional pain medication, for the acute post-op pain. However, in this case, you are responsible for picking up a copy of   our "Post-op Pain Management for Surgeons" handout, and giving it to your surgeon or dentist. This document is available at our office, and does not require an appointment to obtain it. Simply go to our office during business hours (Monday-Thursday from 8:00 AM to 4:00 PM) (Friday 8:00 AM to 12:00 Noon) or if you have a scheduled appointment with us, prior to your surgery, and ask for it by name. In addition, you are responsible for: calling our office [phone], at any time of the day or night, and leaving a message stating your name, name of your surgeon, type of surgery, and date of procedure or surgery. Failure to comply with your responsibilities may result in termination of therapy involving the controlled substances. Medication Agreement Violation. Following the above rules, including your responsibilities will help you in avoiding a Medication Agreement Violation ("Breaking your Pain Medication Contract").  Consequences:  Not following the above rules may result in permanent discontinuation of medication prescription therapy.  *Opioid medications include: morphine, codeine, oxycodone, oxymorphone, hydrocodone, hydromorphone, meperidine, tramadol, tapentadol, buprenorphine, fentanyl, methadone. **Benzodiazepine medications include: diazepam (Valium), alprazolam (Xanax), clonazepam (Klonopine), lorazepam (Ativan), clorazepate (Tranxene), chlordiazepoxide (Librium), estazolam (Prosom), oxazepam (Serax), temazepam (Restoril), triazolam (Halcion) (Last updated: 08/28/2022) ______________________________________________________________________    ______________________________________________________________________  Medication Recommendations and Reminders  Applies to: All patients receiving prescriptions (written and/or  electronic).  Medication Rules & Regulations: You are responsible for reading, knowing, and following our "Medication Rules" document. These exist for your safety and that of others. They are not flexible and neither are we. Dismissing or ignoring them is an act of "non-compliance" that may result in complete and irreversible termination of such medication therapy. For safety reasons, "non-compliance" will not be tolerated. As with the [place] fundamental legal principle of "ignorance of the law is no defense", we will accept no excuses for not having read and knowing the content of documents provided to you by our practice.  Pharmacy of record:  Definition: This is the pharmacy where your electronic prescriptions will be sent.  We do not endorse any particular pharmacy. It is up to you and your insurance to decide what pharmacy to use.  We do not restrict you in your choice of pharmacy. However, once we write for your prescriptions, we will NOT be re-sending more prescriptions to fix restricted supply problems created by your pharmacy, or your insurance.  The pharmacy listed in the electronic medical record should be the one where you want electronic prescriptions to be sent. If you choose to change pharmacy, simply notify our nursing staff. Changes will be made only during your regular appointments and not over the phone.  Recommendations: Keep all of your pain medications in a safe place, under lock and key, even if you live alone. We will NOT replace lost, stolen, or damaged medication. We do not accept "Police Reports" as proof of medications having been stolen. After you fill your prescription, take 1 week's worth of pills and put them away in a safe place. You should keep a separate, properly labeled bottle for this purpose. The remainder should be kept in the original bottle. Use this as your primary supply, until it runs out. Once it's gone, then you know that you have 1 week's worth of medicine,  and it is time to come in for a prescription refill. If you do this correctly, it is unlikely that you will ever run out of medicine. To make sure that the above recommendation works, it is very important that you make   sure your medication refill appointments are scheduled at least 1 week before you run out of medicine. To do this in an effective manner, make sure that you do not leave the office without scheduling your next medication management appointment. Always ask the nursing staff to show you in your prescription , when your medication will be running out. Then arrange for the receptionist to get you a return appointment, at least 7 days before you run out of medicine. Do not wait until you have 1 or 2 pills left, to come in. This is very poor planning and does not take into consideration that we may need to cancel appointments due to bad weather, sickness, or emergencies affecting our staff. DO NOT ACCEPT A "Partial Fill": If for any reason your pharmacy does not have enough pills/tablets to completely fill or refill your prescription, do not allow for a "partial fill". The law allows the pharmacy to complete that prescription within 72 hours, without requiring a new prescription. If they do not fill the rest of your prescription within those 72 hours, you will need a separate prescription to fill the remaining amount, which we will NOT provide. If the reason for the partial fill is your insurance, you will need to talk to the pharmacist about payment alternatives for the remaining tablets, but again, DO NOT ACCEPT A PARTIAL FILL, unless you can trust your pharmacist to obtain the remainder of the pills within 72 hours.  Prescription refills and/or changes in medication(s):  Prescription refills, and/or changes in dose or medication, will be conducted only during scheduled medication management appointments. (Applies to both, written and electronic prescriptions.) No refills on procedure days. No  medication will be changed or started on procedure days. No changes, adjustments, and/or refills will be conducted on a procedure day. Doing so will interfere with the diagnostic portion of the procedure. No phone refills. No medications will be "called into the pharmacy". No Fax refills. No weekend refills. No Holliday refills. No after hours refills.  Remember:  Business hours are:  Monday to Thursday 8:00 AM to 4:00 PM Provider's Schedule: Derrik Mceachern, MD - Appointments are:  Medication management: Monday and Wednesday 8:00 AM to 4:00 PM Procedure day: Tuesday and Thursday 7:30 AM to 4:00 PM Bilal Lateef, MD - Appointments are:  Medication management: Tuesday and Thursday 8:00 AM to 4:00 PM Procedure day: Monday and Wednesday 7:30 AM to 4:00 PM (Last update: 08/28/2022) ______________________________________________________________________    ____________________________________________________________________________________________  Drug Holidays  What is a "Drug Holiday"? Drug Holiday: is the name given to the process of slowly tapering down and temporarily stopping the pain medication for the purpose of decreasing or eliminating tolerance to the drug.  Benefits Improved effectiveness Decreased required effective dose Improved pain control End dependence on high dose therapy Decrease cost of therapy Uncovering "opioid-induced hyperalgesia". (OIH)  What is "opioid hyperalgesia"? It is a paradoxical increase in pain caused by exposure to opioids. Stopping the opioid pain medication, contrary to the expected, it actually decreases or completely eliminates the pain. Ref.: "A comprehensive review of opioid-induced hyperalgesia". Marion Lee, et.al. Pain Physician. 2011 Mar-Apr;14(2):145-61.  What is tolerance? Tolerance: the progressive loss of effectiveness of a pain medicine due to repetitive use. A common problem of opioid pain medications.  How long should a "Drug  Holiday" last? Effectiveness depends on the patient staying off all opioid pain medicines for a minimum of 14 consecutive days. (2 weeks)  How about just taking less of the medicine? Does not   work. Will not accomplish goal of eliminating the excess receptors.  How about switching to a different pain medicine? (AKA. "Opioid rotation") Does not work. Creates the illusion of effectiveness by taking advantage of inaccurate equivalent dose calculations between different opioids. -This "technique" was promoted by studies funded by pharmaceutical companies, such as [employer], creators of "OxyContin".  Can I stop the medicine "cold turkey"? We do not recommend it. You should always coordinate with your prescribing physician to make the transition as smoothly as possible. Avoid stopping the medicine abruptly without consulting. We recommend a "slow taper".  What is a slow taper? Taper: refers to the gradual decrease in dose.   How do I stop/taper the dose? Slowly. Decrease the daily amount of pills that you take by one (1) pill every seven (7) days. This is called a "slow downward taper". Example: if you normally take four (4) pills per day, drop it to three (3) pills per day for seven (7) days, then to two (2) pills per day for seven (7) days, then to one (1) per day for seven (7) days, and then stop the medicine. The 14 day "Drug Holiday" starts on the first day without medicine.   Will I experience withdrawals? Unlikely with a slow taper.  What triggers withdrawals? Withdrawals are triggered by the sudden/abrupt stop of high dose opioids. Withdrawals can be avoided by slowly decreasing the dose over a prolonged period of time.  What are withdrawals? Symptoms associated with sudden/abrupt reduction/stopping of high-dose, long-term use of pain medication. Withdrawal are seldom seen on low dose therapy, or patients rarely taking opioid medication.  Early Withdrawal Symptoms may  include: Agitation Anxiety Muscle aches Increased tearing Insomnia Runny nose Sweating Yawning  Late symptoms may include: Abdominal cramping Diarrhea Dilated pupils Goose bumps Nausea Vomiting  When could I see withdrawals? Onset: 8-24 hours after last use for most opioids. 12-48 hours for long-acting opioids (i.e.: methadone)  How long could they last? Duration: 4-10 days for most opioids. 14-21 days for long-acting opioids (i.e.: methadone)  What will happen after I complete my "Drug Holiday"? The need and indications for the opioid analgesic will be reviewed before restarting the medication. Dose requirements will likely decrease and the dose will need to be adjusted accordingly.   (Last update: 01/23/2023) ____________________________________________________________________________________________    ____________________________________________________________________________________________  WARNING: CBD (cannabidiol) & Delta (Delta-8 tetrahydrocannabinol) products.   Applicable to:  All individuals currently taking or considering taking CBD (cannabidiol) and, more important, all patients taking opioid analgesic controlled substances (pain medication). (Example: oxycodone; oxymorphone; hydrocodone; hydromorphone; morphine; methadone; tramadol; tapentadol; fentanyl; buprenorphine; butorphanol; dextromethorphan; meperidine; codeine; etc.)  Introduction:  Recently there has been a drive towards the use of "natural" products for the treatment of different conditions, including pain anxiety and sleep disorders. Marijuana and hemp are two varieties of the cannabis genus plants. Marijuana and its derivatives are illegal, while hemp and its derivatives are not. Cannabidiol (CBD) and tetrahydrocannabinol (THC), are two natural compounds found in plants of the Cannabis genus. They can both be extracted from hemp or marijuana. Both compounds interact with your body's endocannabinoid  system in very different ways. CBD is associated with pain relief (analgesia) while THC is associated with the psychoactive effects ("the high") obtained from the use of marijuana products. There are two main types of THC: Delta-9, which comes from the marijuana plant and it is illegal, and Delta-8, which comes from the hemp plant, and it is legal. (Both, Delta-9-THC and Delta-8-THC are psychoactive and   give you "the high".)   Legality:  Marijuana and its derivatives: illegal Hemp and its derivatives: Legal (State dependent) UPDATE: (12/22/2021) The Drug Enforcement Agency (DEA) issued a letter stating that "delta" cannabinoids, including Delta-8-THCO and Delta-9-THCO, synthetically derived from hemp do not qualify as hemp and will be viewed as Schedule I drugs. (Schedule I drugs, substances, or chemicals are defined as drugs with no currently accepted medical use and a high potential for abuse. Some examples of Schedule I drugs are: heroin, lysergic acid diethylamide (LSD), marijuana (cannabis), 3,4-methylenedioxymethamphetamine (ecstasy), methaqualone, and peyote.) (https://www.dea.gov)  Legal status of CBD in [place]:  "Conditionally Legal"  Reference: "FDA Regulation of Cannabis and Cannabis-Derived Products, Including Cannabidiol (CBD)" - https://www.fda.gov/news-events/public-health-focus/fda-regulation-cannabis-and-cannabis-derived-products-including-cannabidiol-cbd  Warning:  CBD is not FDA approved and has not undergo the same manufacturing controls as prescription drugs.  This means that the purity and safety of available CBD may be questionable. Most of the time, despite manufacturer's claims, it is contaminated with THC (delta-9-tetrahydrocannabinol - the chemical in marijuana responsible for the "HIGH").  When this is the case, the THC contaminant will trigger a positive urine drug screen (UDS) test for Marijuana (carboxy-THC).   The FDA recently put out a warning about 5 things that everyone  should be aware of regarding Delta-8 THC: Delta-8 THC products have not been evaluated or approved by the FDA for safe use and may be marketed in ways that put the public health at risk. The FDA has received adverse event reports involving delta-8 THC-containing products. Delta-8 THC has psychoactive and intoxicating effects. Delta-8 THC manufacturing often involve use of potentially harmful chemicals to create the concentrations of delta-8 THC claimed in the marketplace. The final delta-8 THC product may have potentially harmful by-products (contaminants) due to the chemicals used in the process. Manufacturing of delta-8 THC products may occur in uncontrolled or unsanitary settings, which may lead to the presence of unsafe contaminants or other potentially harmful substances. Delta-8 THC products should be kept out of the reach of children and pets.  NOTE: Because a positive UDS for any illicit substance is a violation of our medication agreement, your opioid analgesics (pain medicine) may be permanently discontinued.  MORE ABOUT CBD  General Information: CBD was discovered in 1940 and it is a derivative of the cannabis sativa genus plants (Marijuana and Hemp). It is one of the 113 identified substances found in Marijuana. It accounts for up to 40% of the plant's extract. As of 2018, preliminary clinical studies on CBD included research for the treatment of anxiety, movement disorders, and pain. CBD is available and consumed in multiple forms, including inhalation of smoke or vapor, as an aerosol spray, and by mouth. It may be supplied as an oil containing CBD, capsules, dried cannabis, or as a liquid solution. CBD is thought not to be as psychoactive as THC (delta-9-tetrahydrocannabinol - the chemical in marijuana responsible for the "HIGH"). Studies suggest that CBD may interact with different biological target receptors in the body, including cannabinoid and other neurotransmitter receptors. As of  2018 the mechanism of action for its biological effects has not been determined.  Side-effects  Adverse reactions: Dry mouth, diarrhea, decreased appetite, fatigue, drowsiness, malaise, weakness, sleep disturbances, and others.  Drug interactions:  CBD may interact with medications such as blood-thinners. CBD causes drowsiness on its own and it will increase drowsiness caused by other medications, including antihistamines (such as Benadryl), benzodiazepines (Xanax, Ativan, Valium), antipsychotics, antidepressants, opioids, alcohol and supplements such as kava, melatonin and St. John's Wort.    Other drug interactions: Brivaracetam (Briviact); Caffeine; Carbamazepine (Tegretol); Citalopram (Celexa); Clobazam (Onfi); Eslicarbazepine (Aptiom); Everolimus (Zostress); Lithium; Methadone (Dolophine); Rufinamide (Banzel); Sedative medications (CNS depressants); Sirolimus (Rapamune); Stiripentol (Diacomit); Tacrolimus (Prograf); Tamoxifen ; Soltamox); Topiramate (Topamax); Valproate; Warfarin (Coumadin); Zonisamide. (Last update: 10/15/2022) ____________________________________________________________________________________________   ____________________________________________________________________________________________  Naloxone Nasal Spray  Why am I receiving this medication? [place] STOP ACT requires that all patients taking high dose opioids or at risk of opioids respiratory depression, be prescribed an opioid reversal agent, such as Naloxone (AKA: Narcan).  What is this medication? NALOXONE (nal OX one) treats opioid overdose, which causes slow or shallow breathing, severe drowsiness, or trouble staying awake. Call emergency services after using this medication. You may need additional treatment. Naloxone works by reversing the effects of opioids. It belongs to a group of medications called opioid blockers.  COMMON BRAND NAME(S): Kloxxado, Narcan  What should I tell my care team before  I take this medication? They need to know if you have any of these conditions: Heart disease Substance use disorder An unusual or allergic reaction to naloxone, other medications, foods, dyes, or preservatives Pregnant or trying to get pregnant Breast-feeding  When to use this medication? This medication is to be used for the treatment of respiratory depression (less than 8 breaths per minute) secondary to opioid overdose.   How to use this medication? This medication is for use in the nose. Lay the person on their back. Support their neck with your hand and allow the head to tilt back before giving the medication. The nasal spray should be given into 1 nostril. After giving the medication, move the person onto their side. Do not remove or test the nasal spray until ready to use. Get emergency medical help right away after giving the first dose of this medication, even if the person wakes up. You should be familiar with how to recognize the signs and symptoms of a narcotic overdose. If more doses are needed, give the additional dose in the other nostril. Talk to your care team about the use of this medication in children. While this medication may be prescribed for children as young as newborns for selected conditions, precautions do apply.  Naloxone Overdosage: If you think you have taken too much of this medicine contact a poison control center or emergency room at once.  NOTE: This medicine is only for you. Do not share this medicine with others.  What if I miss a dose? This does not apply.  What may interact with this medication? This is only used during an emergency. No interactions are expected during emergency use. This list may not describe all possible interactions. Give your health care provider a list of all the medicines, herbs, non-prescription drugs, or dietary supplements you use. Also tell them if you smoke, drink alcohol, or use illegal drugs. Some items may interact with  your medicine.  What should I watch for while using this medication? Keep this medication ready for use in the case of an opioid overdose. Make sure that you have the phone number of your care team and local hospital ready. You may need to have additional doses of this medication. Each nasal spray contains a single dose. Some emergencies may require additional doses. After use, bring the treated person to the nearest hospital or call 911. Make sure the treating care team knows that the person has received a dose of this medication. You will receive additional instructions on what to do during and after use of this   medication before an emergency occurs.  What side effects may I notice from receiving this medication? Side effects that you should report to your care team as soon as possible: Allergic reactions--skin rash, itching, hives, swelling of the face, lips, tongue, or throat Side effects that usually do not require medical attention (report these to your care team if they continue or are bothersome): Constipation Dryness or irritation inside the nose Headache Increase in blood pressure Muscle spasms Stuffy nose Toothache This list may not describe all possible side effects. Call your doctor for medical advice about side effects. You may report side effects to FDA at 1-800-FDA-1088.  Where should I keep my medication? Because this is an emergency medication, you should keep it with you at all times.  Keep out of the reach of children and pets. Store between 20 and 25 degrees C (68 and 77 degrees F). Do not freeze. Throw away any unused medication after the expiration date. Keep in original box until ready to use.  NOTE: This sheet is a summary. It may not cover all possible information. If you have questions about this medicine, talk to your doctor, pharmacist, or health care provider.   2023 Elsevier/Gold Standard (2021-06-30  00:00:00)  ____________________________________________________________________________________________   

## 2023-03-12 NOTE — Progress Notes (Unsigned)
PROVIDER NOTE: Information contained herein reflects review and annotations entered in association with encounter. Interpretation of such information and data should be left to medically-trained personnel. Information provided to patient can be located elsewhere in the medical record under "Patient Instructions". Document created using STT-dictation technology, any transcriptional errors that may result from process are unintentional.    Patient: James [name]  Service Category: E/M  Provider: Oswaldo Done, MD  DOB: Apr 18, 1956  DOS: 03/13/2023  Referring Provider: Netta Neat, MD  MRN: [id number]  Specialty: Interventional Pain Management  PCP: Netta Neat, MD  Type: Established Patient  Setting: Ambulatory outpatient    Location: Office  Delivery: Face-to-face     HPI  [name], a 67 y.o. year old male, is here today because of his Chronic pain syndrome [G89.4]. [name]'s primary complain today is No chief complaint on file.  Pertinent problems: 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 [name]); Epidural fibrosis; Chronic musculoskeletal pain; Neurogenic pain; Neuropathic pain; Chronic sacroiliac joint pain (Bilateral) (R>L); Psoriatic arthritis (HCC); Chronic lower extremity pain  (2ry area of Pain) (Bilateral) (L>R); Psoriasis with arthropathy (HCC); Numbness and tingling of both legs; Abnormal nerve conduction studies (06/20/16); Chronic pain syndrome; Perineal pain; Chest pain with high risk for cardiac etiology; Chronic upper extremity pain(R>L); Cervicalgia; DDD (degenerative disc disease), cervical; Closed fracture of lumbar vertebral body (HCC) (L2); Neurogenic bladder s/p L2 fracture (04/19/2020); Lumbosacral radiculopathy at L5 (Right); Compression fracture of L2 lumbar vertebra, sequela; Abnormal MRI, cervical spine (09/19/2018); Trigger finger, ring finger (Left); Occipital neuralgia (midline); Trigger finger, ring finger (Right); Trigger finger, middle finger (Right); and Right foot drop on their pertinent problem list. Pain Assessment: Severity of   is reported as a  /10. Location:    / . Onset:  . Quality:  . Timing:  . Modifying factor(s):  Marland Kitchen Vitals:  vitals were not taken for this visit.  BMI: Estimated body mass index is 36.62 kg/m as calculated from the following:   Height as of 12/12/22: 6' (1.829 m).   Weight as of 12/12/22: 270 lb (122.5 kg). Last encounter: 12/12/2022. Last procedure: Visit date not found.  Reason for encounter: medication management. ***  Routine UDS ordered today.   RTCB: 06/17/2023   Pharmacotherapy Assessment  Analgesic: Oxycodone IR 20 mg, 1 tab PO q 8 hrs (60 mg/day of oxycodone) MME/day: 120 mg/day.   Monitoring: [place] PMP: PDMP reviewed during this encounter.       Pharmacotherapy: No side-effects or adverse reactions reported. Compliance: No problems identified. Effectiveness: Clinically acceptable.  No notes on file  No results found for: "CBDTHCR" No results found for: "D8THCCBX" No results found for: "D9THCCBX"  UDS:  Summary  Date Value Ref Range Status  03/13/2022 Note  Final    Comment:    ==================================================================== [employer] [facility]  (MW) ==================================================================== Test  Result       Flag       Units  Drug Present and Declared for Prescription Verification   Oxycodone                      3181         EXPECTED   ng/mg creat   Oxymorphone                    >3367        EXPECTED   ng/mg creat   Noroxycodone                   >3367        EXPECTED   ng/mg creat   Noroxymorphone                 929          EXPECTED   ng/mg creat    Sources of oxycodone are scheduled prescription medications.    Oxymorphone, noroxycodone, and noroxymorphone are expected    metabolites of oxycodone. Oxymorphone is also available as a    scheduled prescription medication.  ==================================================================== Test                      Result    Flag   Units      Ref Range   Creatinine              297              mg/dL      >=96 ==================================================================== Declared Medications:  The flagging and interpretation on this report are based on the  following declared medications.  Unexpected results may arise from  inaccuracies in the declared medications.   **Note: The testing scope of this panel includes these medications:   Oxycodone   **Note: The testing scope of this panel does not include the  following reported medications:   Albuterol (Ventolin HFA)  Cholecalciferol  Levothyroxine (Synthroid)  Loratadine (Claritin)  Metoprolol (Lopressor)  Multivitamin  Naloxone (Narcan)  Tizanidine (Zanaflex)  Vitamin B  Vitamin D2 (Ergocalciferol) ==================================================================== For clinical consultation, please call [phone]. ====================================================================       ROS  Constitutional: Denies any fever or chills Gastrointestinal: No reported hemesis, hematochezia, vomiting, or acute GI distress Musculoskeletal:  Denies any acute onset joint swelling, redness, loss of ROM, or weakness Neurological: No reported episodes of acute onset apraxia, aphasia, dysarthria, agnosia, amnesia, paralysis, loss of coordination, or loss of consciousness  Medication Review  Cholecalciferol, Ergocalciferol, Oxycodone HCl, albuterol, b complex vitamins, levothyroxine, loratadine, metoprolol succinate, multivitamin, naloxone, and tiZANidine  History Review  Allergy: Mr. Rolon is allergic to gabapentin, penicillins, lactose, nsaids, talwin [pentazocine], codeine, procaine, and sulfa antibiotics. Drug: Mr. Kukura  reports no history of drug use. Alcohol:  reports no history of alcohol use. Tobacco:  reports that he has quit smoking. His smoking use included cigarettes. His smokeless tobacco use includes chew. Social: Mr. Earles  reports that he has quit smoking. His smoking use included cigarettes. His smokeless tobacco use includes chew. He reports that he does not drink alcohol and does not use drugs. 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 Surgical: Mr. Herzing  has a past surgical history that includes Spine surgery; Eye surgery; Fracture surgery (Right); Fracture surgery (Bilateral); Fracture surgery (Bilateral); Joint replacement (Right); Spinal cord stimulator implant; Spinal cord stimulator removal; Patella arthroplasty; Shoulder arthroscopy (Bilateral); Esophagogastroduodenoscopy (egd) with  propofol (N/A, 01/20/2016); and Colonoscopy (2009). Family: family history includes Arthritis in his father and mother; COPD in his father; Cancer in his father and mother; Diabetes in his mother; Hematuria in his father and mother; Hypertension in his father and mother; Kidney disease in his mother; Kidney failure in his mother; Prostate cancer in his father; Stroke in his mother.  Laboratory Chemistry Profile   Renal Lab Results  Component Value Date   BUN 15 08/19/2019   CREATININE 1.47 (H) 08/19/2019   GFRAA 59 (L) 08/19/2019   GFRNONAA 51 (L) 08/19/2019    Hepatic Lab Results  Component Value Date   AST 25 08/19/2019   ALT 24 08/19/2019   ALBUMIN 4.2 08/19/2019   ALKPHOS 47 08/19/2019    Electrolytes Lab Results  Component Value Date   NA 138 08/19/2019   K 4.1 08/19/2019   CL 100 08/19/2019   CALCIUM 9.3 08/19/2019   MG 2.1 08/19/2019    Bone Lab Results  Component Value Date   VD25OH 26.19 (L) 08/19/2019   25OHVITD1 42 12/06/2016   25OHVITD2 <1.0 12/06/2016   25OHVITD3 42 12/06/2016   TESTOSTERONE 285 02/12/2018    Inflammation (CRP: Acute Phase) (ESR: Chronic Phase) Lab Results  Component Value Date   CRP <0.8 08/19/2019   ESRSEDRATE 1 08/19/2019         Note: Above Lab results reviewed.  Recent Imaging Review  MR CERVICAL SPINE WO CONTRAST CLINICAL DATA:  Neck pain worse on the right with radiculopathy. Numbness in the arms and hands with worsening symptoms in the past 6 months.  EXAM: MRI CERVICAL SPINE WITHOUT CONTRAST  TECHNIQUE: Multiplanar, multisequence MR imaging of the cervical spine was performed. No intravenous contrast was administered.  COMPARISON:  12/13/2016  FINDINGS: Alignment: Chronic cervical spine straightening. Unchanged minimal anterolisthesis of C3 on C4.  Vertebrae: No fracture or suspicious osseous lesion. Chronic degenerative endplate changes at C6-7 with decreased degenerative endplate edema.  Cord: Normal  signal.  Posterior Fossa, vertebral arteries, paraspinal tissues: Unremarkable.  Disc levels:  C2-3: Mild right and moderate to severe left facet arthrosis without stenosis, unchanged.  C3-4: Central disc protrusion, uncovertebral spurring, and severe right and moderate left facet arthrosis result in borderline spinal stenosis with slight ventral cord flattening and moderate right neural foraminal stenosis, unchanged.  C4-5: Minimal disc bulging, mild uncovertebral spurring, and moderate right facet arthrosis without stenosis, unchanged.  C5-6: Moderate disc space narrowing. Disc bulging, uncovertebral spurring, and moderate right and mild left facet arthrosis result in mild spinal stenosis and moderate right greater than left neural foraminal stenosis, unchanged.  C6-7: Severe disc space narrowing. Broad-based posterior disc osteophyte complex results in mild spinal stenosis and mild-to-moderate right and borderline left neural foraminal stenosis, unchanged.  C7-T1: Minimal disc bulging and moderate to severe right and mild left facet arthrosis result in mild right neural foraminal stenosis without spinal stenosis, unchanged.  IMPRESSION: 1. Decreased degenerative endplate edema at Z6-1. 2. Otherwise unchanged appearance of the cervical spine with up to mild spinal and moderate neural foraminal stenosis at multiple levels as above.  Electronically Signed   By: Jolaine Click.D.  On: 09/19/2018 10:30 Note: Reviewed        Physical Exam  General appearance: Well nourished, well developed, and well hydrated. In no apparent acute distress Mental status: Alert, oriented x 3 (person, place, & time)       Respiratory: No evidence of acute respiratory distress Eyes: PERLA Vitals: There were no vitals taken for this visit. BMI: Estimated body mass index is 36.62 kg/m as calculated from the following:   Height as of 12/12/22: 6' (1.829 m).   Weight as of 12/12/22: 270 lb  (122.5 kg). Ideal: Patient weight not recorded  Assessment   Diagnosis Status  1. Chronic pain syndrome   2. Cervicalgia (Right)   3. Cervical spondylosis (C7 intravertebral body cyst)   4. Pharmacologic therapy   5. Chronic knee pain (Bilateral) (R>L)   6. Encounter for medication management   7. 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 [name])   8. Encounter for chronic pain management   9. Chronic cervical radicular pain (Right)   10. Chronic use of opiate for therapeutic purpose   11. Chronic lower extremity pain (2ry area of Pain) (Bilateral) (L>R)   12. Chronic low back pain (1ry area of Pain) (Bilateral) (R>L)    Controlled Controlled Controlled   Updated Problems: No problems updated.  Plan of Care  Problem-specific:  No problem-specific Assessment & Plan notes found for this encounter.  Mr. TIRSO STOCKS has a current medication list which includes the following long-term medication(s): albuterol, naloxone, oxycodone hcl, oxycodone hcl, and tizanidine.  Pharmacotherapy (Medications Ordered): No orders of the defined types were placed in this encounter.  Orders:  No orders of the defined types were placed in this encounter.  Follow-up plan:   No follow-ups on file.      Interventional Therapies  Risks  Complex Considerations:   A-fib; type 2 diabetes; low T; OSA; aortic valve insufficiency; opiate use (120 MME/day)   Planned  Pending:      Under consideration:      Completed:   Therapeutic left ring trigger finger + right middle trigger finger inj. x1 (09/17/2022) (100/100/100/L:30R:90)  Therapeutic right L4-5 LESI x1 (06/26/2016) (50/50/25)  Therapeutic bilateral lumbar facet MBB x2 (04/15/2017) (90/90/50/50)  Therapeutic left lumbar facet RFA x1 (07/18/17) (90/90/90/90)    Completed by other providers:   Lumbar spine surgery (01/23/2022). EMG & NCV (06/20/2016) by Theora Master, MD [facility] neurology) Dx: Generalized LE polyneuropathy +  superimposed right lumbosacral radiculopathy   Therapeutic  Palliative (PRN) options:   Palliative right CESI  Palliative right L4-5 LESI #2   Palliative bilateral lumbar facet MBB  Palliative left lumbar facet RFA #2  Palliative bilateral cervical facet MBB    Pharmacological Recommendations:   Nonopioids transferred 08/31/2020: Zanaflex       Recent Visits Date Type Provider Dept  12/12/22 Office Visit Delano Metz, MD [facility]  Showing recent visits within past 90 days and meeting all other requirements Future Appointments Date Type Provider Dept  03/13/23 Appointment Delano Metz, MD [facility]  Showing future appointments within next 90 days and meeting all other requirements  I discussed the assessment and treatment plan with the patient. The patient was provided an opportunity to ask questions and all were answered. The patient agreed with the plan and demonstrated an understanding of the instructions.  Patient advised to call back or seek an in-person evaluation if the symptoms or condition worsens.  Duration of encounter: *** minutes.  Total time on encounter,  as per AMA guidelines included both the face-to-face and non-face-to-face time personally spent by the physician and/or other qualified health care professional(s) on the day of the encounter (includes time in activities that require the physician or other qualified health care professional and does not include time in activities normally performed by clinical staff). Physician's time may include the following activities when performed: Preparing to see the patient (e.g., pre-charting review of records, searching for previously ordered imaging, lab work, and nerve conduction tests) Review of prior analgesic pharmacotherapies. Reviewing PMP Interpreting ordered tests (e.g., lab work, imaging, nerve conduction tests) Performing post-procedure evaluations, including interpretation of  diagnostic procedures Obtaining and/or reviewing separately obtained history Performing a medically appropriate examination and/or evaluation Counseling and educating the patient/family/caregiver Ordering medications, tests, or procedures Referring and communicating with other health care professionals (when not separately reported) Documenting clinical information in the electronic or other health record Independently interpreting results (not separately reported) and communicating results to the patient/ family/caregiver Care coordination (not separately reported)  Note by: Oswaldo Done, MD Date: 03/13/2023; Time: 11:22 AM

## 2023-03-13 ENCOUNTER — Ambulatory Visit: Payer: Medicare Other | Attending: Pain Medicine | Admitting: Pain Medicine

## 2023-03-13 ENCOUNTER — Encounter: Payer: Self-pay | Admitting: Pain Medicine

## 2023-03-13 VITALS — BP 163/77 | HR 77 | Temp 99.0°F | Resp 16 | Ht 71.0 in | Wt 270.0 lb

## 2023-03-13 DIAGNOSIS — Z79891 Long term (current) use of opiate analgesic: Secondary | ICD-10-CM | POA: Diagnosis present

## 2023-03-13 DIAGNOSIS — M79604 Pain in right leg: Secondary | ICD-10-CM | POA: Insufficient documentation

## 2023-03-13 DIAGNOSIS — G8929 Other chronic pain: Secondary | ICD-10-CM | POA: Diagnosis present

## 2023-03-13 DIAGNOSIS — M961 Postlaminectomy syndrome, not elsewhere classified: Secondary | ICD-10-CM | POA: Diagnosis present

## 2023-03-13 DIAGNOSIS — G894 Chronic pain syndrome: Secondary | ICD-10-CM | POA: Diagnosis not present

## 2023-03-13 DIAGNOSIS — M545 Low back pain, unspecified: Secondary | ICD-10-CM

## 2023-03-13 DIAGNOSIS — M5412 Radiculopathy, cervical region: Secondary | ICD-10-CM | POA: Diagnosis present

## 2023-03-13 DIAGNOSIS — M47812 Spondylosis without myelopathy or radiculopathy, cervical region: Secondary | ICD-10-CM

## 2023-03-13 DIAGNOSIS — M25561 Pain in right knee: Secondary | ICD-10-CM | POA: Diagnosis present

## 2023-03-13 DIAGNOSIS — M25562 Pain in left knee: Secondary | ICD-10-CM | POA: Diagnosis present

## 2023-03-13 DIAGNOSIS — M542 Cervicalgia: Secondary | ICD-10-CM

## 2023-03-13 DIAGNOSIS — M79605 Pain in left leg: Secondary | ICD-10-CM | POA: Insufficient documentation

## 2023-03-13 DIAGNOSIS — Z79899 Other long term (current) drug therapy: Secondary | ICD-10-CM | POA: Diagnosis present

## 2023-03-13 MED ORDER — OXYCODONE HCL 20 MG PO TABS
20.0000 mg | ORAL_TABLET | Freq: Three times a day (TID) | ORAL | 0 refills | Status: DC | PRN
Start: 2023-04-18 — End: 2023-06-11

## 2023-03-13 MED ORDER — OXYCODONE HCL 20 MG PO TABS
20.0000 mg | ORAL_TABLET | Freq: Three times a day (TID) | ORAL | 0 refills | Status: DC | PRN
Start: 2023-05-18 — End: 2023-06-11

## 2023-03-13 MED ORDER — OXYCODONE HCL 20 MG PO TABS
20.0000 mg | ORAL_TABLET | Freq: Three times a day (TID) | ORAL | 0 refills | Status: DC | PRN
Start: 2023-03-19 — End: 2023-06-11

## 2023-03-13 NOTE — Progress Notes (Signed)
Nursing Pain Medication Assessment:  Safety precautions to be maintained throughout the outpatient stay will include: orient to surroundings, keep bed in low position, maintain call bell within reach at all times, provide assistance with transfer out of bed and ambulation.  Medication Inspection Compliance: Pill count conducted under aseptic conditions, in front of the patient. Neither the pills nor the bottle was removed from the patient's sight at any time. Once count was completed pills were immediately returned to the patient in their original bottle.  Medication: Oxycodone IR Pill/Patch Count:  20 of 90 pills remain Pill/Patch Appearance: Markings consistent with prescribed medication Bottle Appearance: Standard pharmacy container. Clearly labeled. Filled Date: 04 / 15 / 2024 Last Medication intake:  Today

## 2023-03-15 LAB — TOXASSURE SELECT 13 (MW), URINE

## 2023-06-10 ENCOUNTER — Encounter: Payer: Medicare Other | Admitting: Pain Medicine

## 2023-06-11 NOTE — Progress Notes (Unsigned)
PROVIDER NOTE: Information contained herein reflects review and annotations entered in association with encounter. Interpretation of such information and data should be left to medically-trained personnel. Information provided to patient can be located elsewhere in the medical record under "Patient Instructions". Document created using STT-dictation technology, any transcriptional errors that may result from process are unintentional.    Patient: James [name]  Service Category: E/M  Provider: Oswaldo Done, MD  DOB: 09/26/1956  DOS: 06/12/2023  Referring Provider: Netta Neat, MD  MRN: [id number]  Specialty: Interventional Pain Management  PCP: Netta Neat, MD  Type: Established Patient  Setting: Ambulatory outpatient    Location: Office  Delivery: Face-to-face     HPI  Mr. GABRYLE WALDE, a 67 y.o. year old male, is here today because of his Chronic pain syndrome [G89.4]. Mr. Pautler's primary complain today is No chief complaint on file.  Pertinent problems: Mr. Angiolillo has Narrowing of intervertebral disc space; Chronic low back pain (1ry area of Pain) (Bilateral) (R>L); Lumbar spondylosis; Chronic neck pain (Right); Cervical spondylosis (C7 intravertebral body cyst); Chronic cervical radicular pain (Right); Chronic lumbar radicular pain (Bilateral) (L>R) (L5 Dermatome); Osteoarthritis; Chronic hip pain; Chronic knee pain (Bilateral) (R>L); Complex regional pain syndrome type II of upper extremity (Right); Chronic upper extremity pain (Right); 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 Roys); Epidural fibrosis; Chronic musculoskeletal pain; Neurogenic pain; Neuropathic pain; Chronic sacroiliac joint pain (Bilateral) (R>L); Psoriatic arthritis (HCC); Chronic lower extremity pain  (2ry area of Pain) (Bilateral) (L>R); Psoriasis with arthropathy (HCC); Numbness and tingling of both legs; Abnormal nerve conduction studies (06/20/16); Chronic pain syndrome; Perineal pain; Chest pain with high risk for cardiac etiology; Chronic upper extremity pain(R>L); Cervicalgia; DDD (degenerative disc disease), cervical; Closed fracture of lumbar vertebral body (HCC) (L2); Neurogenic bladder s/p L2 fracture (04/19/2020); Lumbosacral radiculopathy at L5 (Right); Compression fracture of L2 lumbar vertebra, sequela; Abnormal MRI, cervical spine (09/19/2018); Trigger finger, ring finger (Left); Occipital neuralgia (midline); Trigger finger, ring finger (Right); Trigger finger, middle finger (Right); and Right foot drop on their pertinent problem list. Pain Assessment: Severity of   is reported as a  /10. Location:    / . Onset:  . Quality:  . Timing:  . Modifying factor(s):  Marland Kitchen Vitals:  vitals were not taken for this visit.  BMI: Estimated body mass index is 37.66 kg/m as calculated from the following:   Height as of 03/13/23: 5\' 11"  (1.803 m).   Weight as of 03/13/23: 270 lb (122.5 kg). Last encounter: 03/13/2023. Last procedure: Visit date not found.  Reason for encounter: medication management. ***  RTCB: 09/15/2023   Pharmacotherapy Assessment  Analgesic: Oxycodone IR 20 mg, 1 tab PO q 8 hrs (60 mg/day of oxycodone) MME/day: 120 mg/day.   Monitoring: [employer] PMP: PDMP reviewed during this encounter.       Pharmacotherapy: No side-effects or adverse reactions reported. Compliance: No problems identified. Effectiveness: Clinically acceptable.  No notes on file  No results found for: "CBDTHCR" No results found for: "D8THCCBX" No results found for: "D9THCCBX"  UDS:  Summary  Date Value Ref Range Status  03/13/2023 Note  Final    Comment:    ==================================================================== [employer] [facility]  (MW) ==================================================================== Test  Result       Flag       Units  Drug Present and Declared for Prescription Verification   Oxycodone                      1860         EXPECTED   ng/mg creat   Oxymorphone                    2416         EXPECTED   ng/mg creat   Noroxycodone                   5174         EXPECTED   ng/mg creat   Noroxymorphone                 828          EXPECTED   ng/mg creat    Sources of oxycodone are scheduled prescription medications.    Oxymorphone, noroxycodone, and noroxymorphone are expected    metabolites of oxycodone. Oxymorphone is also available as a    scheduled prescription medication.  ==================================================================== Test                      Result    Flag   Units      Ref Range   Creatinine              134              mg/dL      >=16 ==================================================================== Declared Medications:  The flagging and interpretation on this report are based on the  following declared medications.  Unexpected results may arise from  inaccuracies in the declared medications.   **Note: The testing scope of this panel includes these medications:   Oxycodone   **Note: The testing scope of this panel does not include the  following reported medications:   Albuterol (Ventolin HFA)  Cholecalciferol  Levothyroxine (Synthroid)  Loratadine (Claritin)  Metoprolol (Toprol)  Multivitamin  Naloxone (Narcan)  Tizanidine (Zanaflex)  Vitamin B  Vitamin D2 (Ergocalciferol) ==================================================================== For clinical consultation, please call [phone]. ====================================================================       ROS  Constitutional: Denies any fever or chills Gastrointestinal: No reported hemesis, hematochezia, vomiting, or acute GI distress Musculoskeletal:  Denies any acute onset joint swelling, redness, loss of ROM, or weakness Neurological: No reported episodes of acute onset apraxia, aphasia, dysarthria, agnosia, amnesia, paralysis, loss of coordination, or loss of consciousness  Medication Review  Cholecalciferol, Ergocalciferol, Oxycodone HCl, albuterol, b complex vitamins, levothyroxine, loratadine, metoprolol succinate, multivitamin, naloxone, and tiZANidine  History Review  Allergy: Mr. Allie is allergic to gabapentin, penicillins, lactose, nsaids, talwin [pentazocine], codeine, procaine, and sulfa antibiotics. Drug: Mr. Nelles  reports no history of drug use. Alcohol:  reports no history of alcohol use. Tobacco:  reports that he has quit smoking. His smoking use included cigarettes. His smokeless tobacco use includes chew. Social: Mr. Mckiney  reports that he has quit smoking. His smoking use included cigarettes. His smokeless tobacco use includes chew. He reports that he does not drink alcohol and does not use drugs. Medical:  has a past medical history of Allergy, Anemia, Arthritis, Asthma, Blood transfusion without reported diagnosis, Bronchitis, Bursitis, Cancer (HCC), Cataract, 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09/13/2015), Emphysema of lung (HCC), Gastritis, GERD (gastroesophageal reflux disease), Heart murmur, Hepatitis C, Hiatal hernia, Hypertension, Hypothyroidism,  Kidney stone, Lupus (HCC), Obesity, Osteoporosis, Peripheral nerve disease, Psoriatic arthritis (HCC), Sleep apnea, Supraventricular tachycardia, Tendonitis, and Thyroid disease. Surgical: Mr. Model  has a past surgical history that includes Spine surgery; Eye surgery; Fracture surgery (Right); Fracture surgery (Bilateral); Fracture surgery (Bilateral); Joint replacement (Right); Spinal cord stimulator implant; Spinal cord stimulator removal; Patella arthroplasty; Shoulder arthroscopy (Bilateral); Esophagogastroduodenoscopy (egd) with  propofol (N/A, 01/20/2016); and Colonoscopy (2009). Family: family history includes Arthritis in his father and mother; COPD in his father; Cancer in his father and mother; Diabetes in his mother; Hematuria in his father and mother; Hypertension in his father and mother; Kidney disease in his mother; Kidney failure in his mother; Prostate cancer in his father; Stroke in his mother.  Laboratory Chemistry Profile   Renal Lab Results  Component Value Date   BUN 15 08/19/2019   CREATININE 1.47 (H) 08/19/2019   GFRAA 59 (L) 08/19/2019   GFRNONAA 51 (L) 08/19/2019    Hepatic Lab Results  Component Value Date   AST 25 08/19/2019   ALT 24 08/19/2019   ALBUMIN 4.2 08/19/2019   ALKPHOS 47 08/19/2019    Electrolytes Lab Results  Component Value Date   NA 138 08/19/2019   K 4.1 08/19/2019   CL 100 08/19/2019   CALCIUM 9.3 08/19/2019   MG 2.1 08/19/2019    Bone Lab Results  Component Value Date   VD25OH 26.19 (L) 08/19/2019   25OHVITD1 42 12/06/2016   25OHVITD2 <1.0 12/06/2016   25OHVITD3 42 12/06/2016   TESTOSTERONE 285 02/12/2018    Inflammation (CRP: Acute Phase) (ESR: Chronic Phase) Lab Results  Component Value Date   CRP <0.8 08/19/2019   ESRSEDRATE 1 08/19/2019         Note: Above Lab results reviewed.  Recent Imaging Review  MR CERVICAL SPINE WO CONTRAST CLINICAL DATA:  Neck pain worse on the right with radiculopathy. Numbness in the arms and hands with worsening symptoms in the past 6 months.  EXAM: MRI CERVICAL SPINE WITHOUT CONTRAST  TECHNIQUE: Multiplanar, multisequence MR imaging of the cervical spine was performed. No intravenous contrast was administered.  COMPARISON:  12/13/2016  FINDINGS: Alignment: Chronic cervical spine straightening. Unchanged minimal anterolisthesis of C3 on C4.  Vertebrae: No fracture or suspicious osseous lesion. Chronic degenerative endplate changes at C6-7 with decreased degenerative endplate edema.  Cord: Normal  signal.  Posterior Fossa, vertebral arteries, paraspinal tissues: Unremarkable.  Disc levels:  C2-3: Mild right and moderate to severe left facet arthrosis without stenosis, unchanged.  C3-4: Central disc protrusion, uncovertebral spurring, and severe right and moderate left facet arthrosis result in borderline spinal stenosis with slight ventral cord flattening and moderate right neural foraminal stenosis, unchanged.  C4-5: Minimal disc bulging, mild uncovertebral spurring, and moderate right facet arthrosis without stenosis, unchanged.  C5-6: Moderate disc space narrowing. Disc bulging, uncovertebral spurring, and moderate right and mild left facet arthrosis result in mild spinal stenosis and moderate right greater than left neural foraminal stenosis, unchanged.  C6-7: Severe disc space narrowing. Broad-based posterior disc osteophyte complex results in mild spinal stenosis and mild-to-moderate right and borderline left neural foraminal stenosis, unchanged.  C7-T1: Minimal disc bulging and moderate to severe right and mild left facet arthrosis result in mild right neural foraminal stenosis without spinal stenosis, unchanged.  IMPRESSION: 1. Decreased degenerative endplate edema at Z6-1. 2. Otherwise unchanged appearance of the cervical spine with up to mild spinal and moderate neural foraminal stenosis at multiple levels as above.  Electronically Signed   By: Freida Busman  Mosetta Putt M.D.   On: 09/19/2018 10:30 Note: Reviewed        Physical Exam  General appearance: Well nourished, well developed, and well hydrated. In no apparent acute distress Mental status: Alert, oriented x 3 (person, place, & time)       Respiratory: No evidence of acute respiratory distress Eyes: PERLA Vitals: There were no vitals taken for this visit. BMI: Estimated body mass index is 37.66 kg/m as calculated from the following:   Height as of 03/13/23: 5\' 11"  (1.803 m).   Weight as of 03/13/23: 270 lb  (122.5 kg). Ideal: Patient weight not recorded  Assessment   Diagnosis Status  1. Chronic pain syndrome   2. Chronic low back pain (1ry area of Pain) (Bilateral) (R>L)   3. Chronic lower extremity pain (2ry area of Pain) (Bilateral) (L>R)   4. Cervicalgia (Right)   5. Cervical spondylosis (C7 intravertebral body cyst)   6. Chronic knee pain (Bilateral) (R>L)   7. 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 Roys)   8. Chronic cervical radicular pain (Right)   9. Pharmacologic therapy   10. Chronic use of opiate for therapeutic purpose   11. Encounter for medication management   12. Encounter for chronic pain management    Controlled Controlled Controlled   Updated Problems: No problems updated.  Plan of Care  Problem-specific:  No problem-specific Assessment & Plan notes found for this encounter.  Mr. CARVIS MANKIEWICZ has a current medication list which includes the following long-term medication(s): albuterol, naloxone, oxycodone hcl, and tizanidine.  Pharmacotherapy (Medications Ordered): No orders of the defined types were placed in this encounter.  Orders:  No orders of the defined types were placed in this encounter.  Follow-up plan:   No follow-ups on file.      Interventional Therapies  Risks  Complex Considerations:   A-fib; type 2 diabetes; low T; OSA; aortic valve insufficiency; opiate use (120 MME/day)   Planned  Pending:      Under consideration:      Completed:   Therapeutic left ring trigger finger + right middle trigger finger inj. x1 (09/17/2022) (100/100/100/L:30R:90)  Therapeutic right L4-5 LESI x1 (06/26/2016) (50/50/25)  Therapeutic bilateral lumbar facet MBB x2 (04/15/2017) (90/90/50/50)  Therapeutic left lumbar facet RFA x1 (07/18/17) (90/90/90/90)    Completed by other providers:   Lumbar spine surgery (01/23/2022). EMG & NCV (06/20/2016) by Theora Master, MD [facility] neurology) Dx: Generalized LE polyneuropathy + superimposed  right lumbosacral radiculopathy   Therapeutic  Palliative (PRN) options:   Palliative right CESI  Palliative right L4-5 LESI #2   Palliative bilateral lumbar facet MBB  Palliative left lumbar facet RFA #2  Palliative bilateral cervical facet MBB    Pharmacological Recommendations:   Nonopioids transferred 08/31/2020: Zanaflex        Recent Visits Date Type Provider Dept  03/13/23 Office Visit Delano Metz, MD [facility]  Showing recent visits within past 90 days and meeting all other requirements Future Appointments Date Type Provider Dept  06/12/23 Appointment Delano Metz, MD [facility]  Showing future appointments within next 90 days and meeting all other requirements  I discussed the assessment and treatment plan with the patient. The patient was provided an opportunity to ask questions and all were answered. The patient agreed with the plan and demonstrated an understanding of the instructions.  Patient advised to call back or seek an in-person evaluation if the symptoms or condition worsens.  Duration of encounter: *** minutes.  Total time on encounter, as per AMA guidelines included both the face-to-face and non-face-to-face time personally spent by the physician and/or other qualified health care professional(s) on the day of the encounter (includes time in activities that require the physician or other qualified health care professional and does not include time in activities normally performed by clinical staff). Physician's time may include the following activities when performed: Preparing to see the patient (e.g., pre-charting review of records, searching for previously ordered imaging, lab work, and nerve conduction tests) Review of prior analgesic pharmacotherapies. Reviewing PMP Interpreting ordered tests (e.g., lab work, imaging, nerve conduction tests) Performing post-procedure evaluations, including interpretation of diagnostic  procedures Obtaining and/or reviewing separately obtained history Performing a medically appropriate examination and/or evaluation Counseling and educating the patient/family/caregiver Ordering medications, tests, or procedures Referring and communicating with other health care professionals (when not separately reported) Documenting clinical information in the electronic or other health record Independently interpreting results (not separately reported) and communicating results to the patient/ family/caregiver Care coordination (not separately reported)  Note by: Oswaldo Done, MD Date: 06/12/2023; Time: 7:06 AM

## 2023-06-11 NOTE — Patient Instructions (Incomplete)
____________________________________________________________________________________________  Opioid Pain Medication Update  To: All patients taking opioid pain medications. (I.e.: hydrocodone, hydromorphone, oxycodone, oxymorphone, morphine, codeine, methadone, tapentadol, tramadol, buprenorphine, fentanyl, etc.)  Re: Updated review of side effects and adverse reactions of opioid analgesics, as well as new information about long term effects of this class of medications.  Direct risks of long-term opioid therapy are not limited to opioid addiction and overdose. Potential medical risks include serious fractures, breathing problems during sleep, hyperalgesia, immunosuppression, chronic constipation, bowel obstruction, myocardial infarction, and tooth decay secondary to xerostomia.  Unpredictable adverse effects that can occur even if you take your medication correctly: Cognitive impairment, respiratory depression, and death. Most people think that if they take their medication "correctly", and "as instructed", that they will be safe. Nothing could be farther from the truth. In reality, a significant amount of recorded deaths associated with the use of opioids has occurred in individuals that had taken the medication for a long time, and were taking their medication correctly. The following are examples of how this can happen: Patient taking his/her medication for a long time, as instructed, without any side effects, is given a certain antibiotic or another unrelated medication, which in turn triggers a "Drug-to-drug interaction" leading to disorientation, cognitive impairment, impaired reflexes, respiratory depression or an untoward event leading to serious bodily harm or injury, including death.  Patient taking his/her medication for a long time, as instructed, without any side effects, develops an acute impairment of liver and/or kidney function. This will lead to a rapid inability of the body to  breakdown and eliminate their pain medication, which will result in effects similar to an "overdose", but with the same medicine and dose that they had always taken. This again may lead to disorientation, cognitive impairment, impaired reflexes, respiratory depression or an untoward event leading to serious bodily harm or injury, including death.  A similar problem will occur with patients as they grow older and their liver and kidney function begins to decrease as part of the aging process.  Background information: Historically, the original case for using long-term opioid therapy to treat chronic noncancer pain was based on safety assumptions that subsequent experience has called into question. In 1996, the American Pain Society and the American Academy of Pain Medicine issued a consensus statement supporting long-term opioid therapy. This statement acknowledged the dangers of opioid prescribing but concluded that the risk for addiction was low; respiratory depression induced by opioids was short-lived, occurred mainly in opioid-naive patients, and was antagonized by pain; tolerance was not a common problem; and efforts to control diversion should not constrain opioid prescribing. This has now proven to be wrong. Experience regarding the risks for opioid addiction, misuse, and overdose in community practice has failed to support these assumptions.  According to the Centers for Disease Control and Prevention, fatal overdoses involving opioid analgesics have increased sharply over the past decade. Currently, more than 96,700 people die from drug overdoses every year. Opioids are a factor in 7 out of every 10 overdose deaths. Deaths from drug overdose have surpassed motor vehicle accidents as the leading cause of death for individuals between the ages of 37 and 92.  Clinical data suggest that neuroendocrine dysfunction may be very common in both men and women, potentially causing hypogonadism, erectile  dysfunction, infertility, decreased libido, osteoporosis, and depression. Recent studies linked higher opioid dose to increased opioid-related mortality. Controlled observational studies reported that long-term opioid therapy may be associated with increased risk for cardiovascular events. Subsequent meta-analysis concluded  that the safety of long-term opioid therapy in elderly patients has not been proven.   Side Effects and adverse reactions: Common side effects: Drowsiness (sedation). Dizziness. Nausea and vomiting. Constipation. Physical dependence -- Dependence often manifests with withdrawal symptoms when opioids are discontinued or decreased. Tolerance -- As you take repeated doses of opioids, you require increased medication to experience the same effect of pain relief. Respiratory depression -- This can occur in healthy people, especially with higher doses. However, people with COPD, asthma or other lung conditions may be even more susceptible to fatal respiratory impairment.  Uncommon side effects: An increased sensitivity to feeling pain and extreme response to pain (hyperalgesia). Chronic use of opioids can lead to this. Delayed gastric emptying (the process by which the contents of your stomach are moved into your small intestine). Muscle rigidity. Immune system and hormonal dysfunction. Quick, involuntary muscle jerks (myoclonus). Arrhythmia. Itchy skin (pruritus). Dry mouth (xerostomia).  Long-term side effects: Chronic constipation. Sleep-disordered breathing (SDB). Increased risk of bone fractures. Hypothalamic-pituitary-adrenal dysregulation. Increased risk of overdose.  RISKS: Respiratory depression and death: Opioids increase the risk of respiratory depression and death.  Drug-to-drug interactions: Opioids are relatively contraindicated in combination with benzodiazepines, sleep inducers, and other central nervous system depressants. Other classes of medications  (i.e.: certain antibiotics and even over-the-counter medications) may also trigger or induce respiratory depression in some patients.  Medical conditions: Patients with pre-existing respiratory problems are at higher risk of respiratory failure and/or depression when in combination with opioid analgesics. Opioids are relatively contraindicated in some medical conditions such as central sleep apnea.   Fractures and Falls:  Opioids increase the risk and incidence of falls. This is of particular importance in elderly patients.  Endocrine System:  Long-term administration is associated with endocrine abnormalities (endocrinopathies). (Also known as Opioid-induced Endocrinopathy) Influences on both the hypothalamic-pituitary-adrenal axis?and the hypothalamic-pituitary-gonadal axis have been demonstrated with consequent hypogonadism and adrenal insufficiency in both sexes. Hypogonadism and decreased levels of dehydroepiandrosterone sulfate have been reported in men and women. Endocrine effects include: Amenorrhoea in women (abnormal absence of menstruation) Reduced libido in both sexes Decreased sexual function Erectile dysfunction in men Hypogonadisms (decreased testicular function with shrinkage of testicles) Infertility Depression and fatigue Loss of muscle mass Anxiety Depression Immune suppression Hyperalgesia Weight gain Anemia Osteoporosis Patients (particularly women of childbearing age) should avoid opioids. There is insufficient evidence to recommend routine monitoring of asymptomatic patients taking opioids in the long-term for hormonal deficiencies.  Immune System: Human studies have demonstrated that opioids have an immunomodulating effect. These effects are mediated via opioid receptors both on immune effector cells and in the central nervous system. Opioids have been demonstrated to have adverse effects on antimicrobial response and anti-tumour surveillance. Buprenorphine has  been demonstrated to have no impact on immune function.  Opioid Induced Hyperalgesia: Human studies have demonstrated that prolonged use of opioids can lead to a state of abnormal pain sensitivity, sometimes called opioid induced hyperalgesia (OIH). Opioid induced hyperalgesia is not usually seen in the absence of tolerance to opioid analgesia. Clinically, hyperalgesia may be diagnosed if the patient on long-term opioid therapy presents with increased pain. This might be qualitatively and anatomically distinct from pain related to disease progression or to breakthrough pain resulting from development of opioid tolerance. Pain associated with hyperalgesia tends to be more diffuse than the pre-existing pain and less defined in quality. Management of opioid induced hyperalgesia requires opioid dose reduction.  Cancer: Chronic opioid therapy has been associated with an increased risk of cancer  among noncancer patients with chronic pain. This association was more evident in chronic strong opioid users. Chronic opioid consumption causes significant pathological changes in the small intestine and colon. Epidemiological studies have found that there is a link between opium dependence and initiation of gastrointestinal cancers. Cancer is the second leading cause of death after cardiovascular disease. Chronic use of opioids can cause multiple conditions such as GERD, immunosuppression and renal damage as well as carcinogenic effects, which are associated with the incidence of cancers.   Mortality: Long-term opioid use has been associated with increased mortality among patients with chronic non-cancer pain (CNCP).  Prescription of long-acting opioids for chronic noncancer pain was associated with a significantly increased risk of all-cause mortality, including deaths from causes other than overdose.  Reference: Von Korff M, Kolodny A, Deyo RA, Chou R. Long-term opioid therapy reconsidered. Ann Intern Med. 2011  Sep 6;155(5):325-8. doi: 10.7326/[id number]. PMID: 23557322; PMCID: [id number]. Randon Goldsmith, Hayward RA, Dunn KM, [place] KP. Risk of adverse events in patients prescribed long-term opioids: A cohort study in the [place] [facility] [employer] D[employer]. Eur J Pain. 2019 May;23(5):908-922. doi: 10.1002/ejp.1357. Epub 2019 Jan 31. PMID: 76283151. Colameco S, Coren JS, Ciervo CA. Continuous opioid treatment for chronic noncancer pain: a time for moderation in prescribing. Postgrad Med. 2009 Jul;121(4):61-6. doi: 10.3810/pgm.2009.07.2032. PMID: 76160737. William Hamburger RN, [place], Blazina I, Cristopher Peru, Bougatsos C, Deyo RA. The effectiveness and risks of long-term opioid therapy for chronic pain: a systematic review for a [employer] of [employer] Pathways to [employer]. Ann Intern Med. 2015 Feb 17;162(4):276-86. doi: 10.7326/M14-2559. PMID: 10626948. Caryl Bis [facility], [place] DM. NCHS Data Brief No. 22. [place]: Centers for Disease Control and Prevention; 2009. Sep, Increase in Fatal Poisonings Involving Opioid Analgesics in the [place], 1999-2006. Song IA, Choi HR, Oh TK. Long-term opioid use and mortality in patients with chronic non-cancer pain: Ten-year follow-up study in [place] from 2010 through 2019. EClinicalMedicine. 2022 Jul 18;51:101558. doi: 10.1016/j.eclinm.2022.546270. PMID: 35009381; PMCID: [id number]. Huser, W., Schubert, T., Vogelmann, T. et al. All-cause mortality in patients with long-term opioid therapy compared with non-opioid analgesics for chronic non-cancer pain: a database study. BMC Med 18, 162 (2020). http://lester.info/ Rashidian H, Karie Kirks, Malekzadeh R, Haghdoost AA. An Ecological Study of the Association between Opiate Use and Incidence of Cancers. Addict Health. 2016 Fall;8(4):252-260. PMID: 78938101; PMCID: [id number].  Our Goal: Our goal is to control your  pain with means other than the use of opioid pain medications.  Our Recommendation: Talk to your physician about coming off of these medications. We can assist you with the tapering down and stopping these medicines. Based on the new information, even if you cannot completely stop the medication, a decrease in the dose may be associated with a lesser risk. Ask for other means of controlling the pain. Decrease or eliminate those factors that significantly contribute to your pain such as smoking, obesity, and a diet heavily tilted towards "inflammatory" nutrients.  Last Updated: 05/13/2023   ____________________________________________________________________________________________     ____________________________________________________________________________________________  National Pain Medication Shortage  The [place] is experiencing worsening drug shortages. These have had a negative widespread effect on patient care and treatment. Not expected to improve any time soon. Predicted to last past 2029.   Drug shortage list (generic names) Oxycodone IR Oxycodone/APAP Oxymorphone IR Hydromorphone Hydrocodone/APAP Morphine  Where is the problem?  Manufacturing and supply level.  Will this shortage affect you?  Only if you  take any of the above pain medications.  How? You may be unable to fill your prescription.  Your pharmacist may offer a "partial fill" of your prescription. (Warning: Do not accept partial fills.) Prescriptions partially filled cannot be transferred to another pharmacy. Read our Medication Rules and Regulation. Depending on how much medicine you are dependent on, you may experience withdrawals when unable to get the medication.  Recommendations: Consider ending your dependence on opioid pain medications. Ask your pain specialist to assist you with the process. Consider switching to a medication currently not in shortage, such as Buprenorphine. Talk to your pain  specialist about this option. Consider decreasing your pain medication requirements by managing tolerance thru "Drug Holidays". This may help minimize withdrawals, should you run out of medicine. Control your pain thru the use of non-pharmacological interventional therapies.   Your prescriber: Prescribers cannot be blamed for shortages. Medication manufacturing and supply issues cannot be fixed by the prescriber.   NOTE: The prescriber is not responsible for supplying the medication, or solving supply issues. Work with your pharmacist to solve it. The patient is responsible for the decision to take or continue taking the medication and for identifying and securing a legal supply source. By law, supplying the medication is the job and responsibility of the pharmacy. The prescriber is responsible for the evaluation, monitoring, and prescribing of these medications.   Prescribers will NOT: Re-issue prescriptions that have been partially filled. Re-issue prescriptions already sent to a pharmacy.  Re-send prescriptions to a different pharmacy because yours did not have your medication. Ask pharmacist to order more medicine or transfer the prescription to another pharmacy. (Read below.)  New 2023 regulation: "July 06, 2022 Revised Regulation Allows DEA-Registered Pharmacies to Transfer Electronic Prescriptions at a Patient's Request DEA Headquarters Division - Public Information Office Patients now have the ability to request their electronic prescription be transferred to another pharmacy without having to go back to their practitioner to initiate the request. This revised regulation went into effect on Monday, July 02, 2022.     At a patient's request, a DEA-registered retail pharmacy can now transfer an electronic prescription for a controlled substance (schedules II-V) to another DEA-registered retail pharmacy. Prior to this change, patients would have to go through their practitioner to  cancel their prescription and have it re-issued to a different pharmacy. The process was taxing and time consuming for both patients and practitioners.    The Drug Enforcement Administration [facility]) published its intent to revise the process for transferring electronic prescriptions on September 23, 2020.  The final rule was published in the federal register on May 31, 2022 and went into effect 30 days later.  Under the final rule, a prescription can only be transferred once between pharmacies, and only if allowed under existing state or other applicable law. The prescription must remain in its electronic form; may not be altered in any way; and the transfer must be communicated directly between two licensed pharmacists. It's important to note, any authorized refills transfer with the original prescription, which means the entire prescription will be filled at the same pharmacy".  Reference: [email or web address].is [facility] website announcement)  [email or web address] [employer] of [employer])   [employer] / Vol. 88, No. 143 / Thursday, May 31, 2022 / Rules and Regulations DEPARTMENT OF JUSTICE  Drug Enforcement Administration  21 CFR Part 1306  [Docket No. [id number]]  RIN [id number] Transfer of Electronic Prescriptions for Schedules II-V Controlled Substances Between Pharmacies for Initial Filling  ____________________________________________________________________________________________  ____________________________________________________________________________________________  Transfer of Pain Medication between Pharmacies  Re: 2023 DEA Clarification on existing regulation  Published on DEA Website: July 06, 2022  Title: Revised Regulation Allows DEA-Registered Pharmacies to Electrical engineer Prescriptions at a Patient's  Request DEA Headquarters Division - [employer]  "Patients now have the ability to request their electronic prescription be transferred to another pharmacy without having to go back to their practitioner to initiate the request. This revised regulation went into effect on Monday, July 02, 2022.     At a patient's request, a DEA-registered retail pharmacy can now transfer an electronic prescription for a controlled substance (schedules II-V) to another DEA-registered retail pharmacy. Prior to this change, patients would have to go through their practitioner to cancel their prescription and have it re-issued to a different pharmacy. The process was taxing and time consuming for both patients and practitioners.    The Drug Enforcement Administration [facility]) published its intent to revise the process for transferring electronic prescriptions on September 23, 2020.  The final rule was published in the federal register on May 31, 2022 and went into effect 30 days later.  Under the final rule, a prescription can only be transferred once between pharmacies, and only if allowed under existing state or other applicable law. The prescription must remain in its electronic form; may not be altered in any way; and the transfer must be communicated directly between two licensed pharmacists. It's important to note, any authorized refills transfer with the original prescription, which means the entire prescription will be filled at the same pharmacy."    REFERENCES: 1. DEA website announcement [email or web address]  2. Department of Justice website  [email or web address]  3. DEPARTMENT OF JUSTICE Drug Enforcement Administration 21 CFR Part 1306 [Docket No. [id number]] RIN [id number] "Transfer of Electronic Prescriptions for Schedules II-V Controlled Substances  Between Pharmacies for Initial Filling"  ____________________________________________________________________________________________     _______________________________________________________________________  Medication Rules  Purpose: To inform patients, and their family members, of our medication rules and regulations.  Applies to: All patients receiving prescriptions from our practice (written or electronic).  Pharmacy of record: This is the pharmacy where your electronic prescriptions will be sent. Make sure we have the correct one.  Electronic prescriptions: In compliance with the [place] Strengthen Opioid Misuse Prevention (STOP) Act of 2017 (Session Conni Elliot [phone]), effective November 05, 2018, all controlled substances must be electronically prescribed. Written prescriptions, faxing, or calling prescriptions to a pharmacy will no longer be done.  Prescription refills: These will be provided only during in-person appointments. No medications will be renewed without a "face-to-face" evaluation with your provider. Applies to all prescriptions.  NOTE: The following applies primarily to controlled substances (Opioid* Pain Medications).   Type of encounter (visit): For patients receiving controlled substances, face-to-face visits are required. (Not an option and not up to the patient.)  Patient's responsibilities: Pain Pills: Bring all pain pills to every appointment (except for procedure appointments). Pill Bottles: Bring pills in original pharmacy bottle. Bring bottle, even if empty. Always bring the bottle of the most recent fill.  Medication refills: You are responsible for knowing and keeping track of what medications you are taking and when is it that you will need a refill. The day before your appointment: write a list of all prescriptions that need to be refilled. The day of the appointment: give the list to the admitting nurse. Prescriptions will be written only  during appointments. No prescriptions will be written on procedure days. If you forget a  medication: it will not be "Called in", "Faxed", or "electronically sent". You will need to get another appointment to get these prescribed. No early refills. Do not call asking to have your prescription filled early. Partial  or short prescriptions: Occasionally your pharmacy may not have enough pills to fill your prescription.  NEVER ACCEPT a partial fill or a prescription that is short of the total amount of pills that you were prescribed.  With controlled substances the law allows 72 hours for the pharmacy to complete the prescription.  If the prescription is not completed within 72 hours, the pharmacist will require a new prescription to be written. This means that you will be short on your medicine and we WILL NOT send another prescription to complete your original prescription.  Instead, request the pharmacy to send a carrier to a nearby branch to get enough medication to provide you with your full prescription. Prescription Accuracy: You are responsible for carefully inspecting your prescriptions before leaving our office. Have the discharge nurse carefully go over each prescription with you, before taking them home. Make sure that your name is accurately spelled, that your address is correct. Check the name and dose of your medication to make sure it is accurate. Check the number of pills, and the written instructions to make sure they are clear and accurate. Make sure that you are given enough medication to last until your next medication refill appointment. Taking Medication: Take medication as prescribed. When it comes to controlled substances, taking less pills or less frequently than prescribed is permitted and encouraged. Never take more pills than instructed. Never take the medication more frequently than prescribed.  Inform other Doctors: Always inform, all of your healthcare providers, of all the  medications you take. Pain Medication from other Providers: You are not allowed to accept any additional pain medication from any other Doctor or Healthcare provider. There are two exceptions to this rule. (see below) In the event that you require additional pain medication, you are responsible for notifying us, as stated below. Cough Medicine: Often these contain an opioid, such as codeine or hydrocodone. Never accept or take cough medicine containing these opioids if you are already taking an opioid* medication. The combination may cause respiratory failure and death. Medication Agreement: You are responsible for carefully reading and following our Medication Agreement. This must be signed before receiving any prescriptions from our practice. Safely store a copy of your signed Agreement. Violations to the Agreement will result in no further prescriptions. (Additional copies of our Medication Agreement are available upon request.) Laws, Rules, & Regulations: All patients are expected to follow all [address] and [employer], Rules, [facility] [place] [employer]. Ignorance of the Laws does not constitute a valid excuse.  Illegal drugs and Controlled Substances: The use of illegal substances (including, but not limited to marijuana and its derivatives) and/or the illegal use of any controlled substances is strictly prohibited. Violation of this rule may result in the immediate and permanent discontinuation of any and all prescriptions being written by our practice. The use of any illegal substances is prohibited. Adopted CDC guidelines & recommendations: Target dosing levels will be at or below 60 MME/day. Use of benzodiazepines** is not recommended.  Exceptions: There are only two exceptions to the rule of not receiving pain medications from other Healthcare Providers. Exception #1 (Emergencies): In the event of an emergency (i.e.: accident requiring emergency care), you are allowed to receive additional pain  medication. However, you are responsible for: As soon as  you are able, call our office [phone], at any time of the day or night, and leave a message stating your name, the date and nature of the emergency, and the name and dose of the medication prescribed. In the event that your call is answered by a member of our staff, make sure to document and save the date, time, and the name of the person that took your information.  Exception #2 (Planned Surgery): In the event that you are scheduled by another doctor or dentist to have any type of surgery or procedure, you are allowed (for a period no longer than 30 days), to receive additional pain medication, for the acute post-op pain. However, in this case, you are responsible for picking up a copy of our "Post-op Pain Management for Surgeons" handout, and giving it to your surgeon or dentist. This document is available at our office, and does not require an appointment to obtain it. Simply go to our office during business hours (Monday-Thursday from 8:00 AM to 4:00 PM) (Friday 8:00 AM to 12:00 Noon) or if you have a scheduled appointment with [place], prior to your surgery, and ask for it by name. In addition, you are responsible for: calling our office [phone], at any time of the day or night, and leaving a message stating your name, name of your surgeon, type of surgery, and date of procedure or surgery. Failure to comply with your responsibilities may result in termination of therapy involving the controlled substances. Medication Agreement Violation. Following the above rules, including your responsibilities will help you in avoiding a Medication Agreement Violation ("Breaking your Pain Medication Contract").  Consequences:  Not following the above rules may result in permanent discontinuation of medication prescription therapy.  *Opioid medications include: morphine, codeine, oxycodone, oxymorphone, hydrocodone, hydromorphone, meperidine, tramadol,  tapentadol, buprenorphine, fentanyl, methadone. **Benzodiazepine medications include: diazepam (Valium), alprazolam (Xanax), clonazepam (Klonopine), lorazepam (Ativan), clorazepate (Tranxene), chlordiazepoxide (Librium), estazolam (Prosom), oxazepam (Serax), temazepam (Restoril), triazolam (Halcion) (Last updated: 08/28/2022) ______________________________________________________________________    ______________________________________________________________________  Medication Recommendations and Reminders  Applies to: All patients receiving prescriptions (written and/or electronic).  Medication Rules & Regulations: You are responsible for reading, knowing, and following our "Medication Rules" document. These exist for your safety and that of others. They are not flexible and neither are we. Dismissing or ignoring them is an act of "non-compliance" that may result in complete and irreversible termination of such medication therapy. For safety reasons, "non-compliance" will not be tolerated. As with the [place] fundamental legal principle of "ignorance of the law is no defense", we will accept no excuses for not having read and knowing the content of documents provided to you by our practice.  Pharmacy of record:  Definition: This is the pharmacy where your electronic prescriptions will be sent.  We do not endorse any particular pharmacy. It is up to you and your insurance to decide what pharmacy to use.  We do not restrict you in your choice of pharmacy. However, once we write for your prescriptions, we will NOT be re-sending more prescriptions to fix restricted supply problems created by your pharmacy, or your insurance.  The pharmacy listed in the electronic medical record should be the one where you want electronic prescriptions to be sent. If you choose to change pharmacy, simply notify our nursing staff. Changes will be made only during your regular appointments and not over the  phone.  Recommendations: Keep all of your pain medications in a safe place, under lock and key, even  if you live alone. We will NOT replace lost, stolen, or damaged medication. We do not accept "Police Reports" as proof of medications having been stolen. After you fill your prescription, take 1 week's worth of pills and put them away in a safe place. You should keep a separate, properly labeled bottle for this purpose. The remainder should be kept in the original bottle. Use this as your primary supply, until it runs out. Once it's gone, then you know that you have 1 week's worth of medicine, and it is time to come in for a prescription refill. If you do this correctly, it is unlikely that you will ever run out of medicine. To make sure that the above recommendation works, it is very important that you make sure your medication refill appointments are scheduled at least 1 week before you run out of medicine. To do this in an effective manner, make sure that you do not leave the office without scheduling your next medication management appointment. Always ask the nursing staff to show you in your prescription , when your medication will be running out. Then arrange for the receptionist to get you a return appointment, at least 7 days before you run out of medicine. Do not wait until you have 1 or 2 pills left, to come in. This is very poor planning and does not take into consideration that we may need to cancel appointments due to bad weather, sickness, or emergencies affecting our staff. DO NOT ACCEPT A "Partial Fill": If for any reason your pharmacy does not have enough pills/tablets to completely fill or refill your prescription, do not allow for a "partial fill". The law allows the pharmacy to complete that prescription within 72 hours, without requiring a new prescription. If they do not fill the rest of your prescription within those 72 hours, you will need a separate prescription to fill the remaining  amount, which we will NOT provide. If the reason for the partial fill is your insurance, you will need to talk to the pharmacist about payment alternatives for the remaining tablets, but again, DO NOT ACCEPT A PARTIAL FILL, unless you can trust your pharmacist to obtain the remainder of the pills within 72 hours.  Prescription refills and/or changes in medication(s):  Prescription refills, and/or changes in dose or medication, will be conducted only during scheduled medication management appointments. (Applies to both, written and electronic prescriptions.) No refills on procedure days. No medication will be changed or started on procedure days. No changes, adjustments, and/or refills will be conducted on a procedure day. Doing so will interfere with the diagnostic portion of the procedure. No phone refills. No medications will be "called into the pharmacy". No Fax refills. No weekend refills. No Holliday refills. No after hours refills.  Remember:  Business hours are:  Monday to Thursday 8:00 AM to 4:00 PM Provider's Schedule: Delano Metz, MD - Appointments are:  Medication management: Monday and Wednesday 8:00 AM to 4:00 PM Procedure day: Tuesday and Thursday 7:30 AM to 4:00 PM Edward Jolly, MD - Appointments are:  Medication management: Tuesday and Thursday 8:00 AM to 4:00 PM Procedure day: Monday and Wednesday 7:30 AM to 4:00 PM (Last update: 08/28/2022) ______________________________________________________________________   ____________________________________________________________________________________________  Naloxone Nasal Spray  Why am I receiving this medication? [place] STOP ACT requires that all patients taking high dose opioids or at risk of opioids respiratory depression, be prescribed an opioid reversal agent, such as Naloxone (AKA: Narcan).  What is this medication? NALOXONE (  nal OX one) treats opioid overdose, which causes slow or shallow breathing,  severe drowsiness, or trouble staying awake. Call emergency services after using this medication. You may need additional treatment. Naloxone works by reversing the effects of opioids. It belongs to a group of medications called opioid blockers.  COMMON BRAND NAME(S): Kloxxado, Narcan  What should I tell my care team before I take this medication? They need to know if you have any of these conditions: Heart disease Substance use disorder An unusual or allergic reaction to naloxone, other medications, foods, dyes, or preservatives Pregnant or trying to get pregnant Breast-feeding  When to use this medication? This medication is to be used for the treatment of respiratory depression (less than 8 breaths per minute) secondary to opioid overdose.   How to use this medication? This medication is for use in the nose. Lay the person on their back. Support their neck with your hand and allow the head to tilt back before giving the medication. The nasal spray should be given into 1 nostril. After giving the medication, move the person onto their side. Do not remove or test the nasal spray until ready to use. Get emergency medical help right away after giving the first dose of this medication, even if the person wakes up. You should be familiar with how to recognize the signs and symptoms of a narcotic overdose. If more doses are needed, give the additional dose in the other nostril. Talk to your care team about the use of this medication in children. While this medication may be prescribed for children as young as newborns for selected conditions, precautions do apply.  Naloxone Overdosage: If you think you have taken too much of this medicine contact a poison control center or emergency room at once.  NOTE: This medicine is only for you. Do not share this medicine with others.  What if I miss a dose? This does not apply.  What may interact with this medication? This is only used during an  emergency. No interactions are expected during emergency use. This list may not describe all possible interactions. Give your health care provider a list of all the medicines, herbs, non-prescription drugs, or dietary supplements you use. Also tell them if you smoke, drink alcohol, or use illegal drugs. Some items may interact with your medicine.  What should I watch for while using this medication? Keep this medication ready for use in the case of an opioid overdose. Make sure that you have the phone number of your care team and local hospital ready. You may need to have additional doses of this medication. Each nasal spray contains a single dose. Some emergencies may require additional doses. After use, bring the treated person to the nearest hospital or call 911. Make sure the treating care team knows that the person has received a dose of this medication. You will receive additional instructions on what to do during and after use of this medication before an emergency occurs.  What side effects may I notice from receiving this medication? Side effects that you should report to your care team as soon as possible: Allergic reactions--skin rash, itching, hives, swelling of the face, lips, tongue, or throat Side effects that usually do not require medical attention (report these to your care team if they continue or are bothersome): Constipation Dryness or irritation inside the nose Headache Increase in blood pressure Muscle spasms Stuffy nose Toothache This list may not describe all possible side effects. Call your doctor for  medical advice about side effects. You may report side effects to FDA at 1-800-FDA-1088.  Where should I keep my medication? Because this is an emergency medication, you should keep it with you at all times.  Keep out of the reach of children and pets. Store between 20 and 25 degrees C (68 and 77 degrees F). Do not freeze. Throw away any unused medication after the  expiration date. Keep in original box until ready to use.  NOTE: This sheet is a summary. It may not cover all possible information. If you have questions about this medicine, talk to your doctor, pharmacist, or health care provider.   2023 Elsevier/Gold Standard (2021-06-30 00:00:00)  ____________________________________________________________________________________________

## 2023-06-12 ENCOUNTER — Ambulatory Visit: Payer: Medicare Other | Attending: Pain Medicine | Admitting: Pain Medicine

## 2023-06-12 ENCOUNTER — Encounter: Payer: Self-pay | Admitting: Pain Medicine

## 2023-06-12 VITALS — BP 137/73 | HR 79 | Temp 97.6°F | Ht 71.0 in | Wt 270.0 lb

## 2023-06-12 DIAGNOSIS — M25561 Pain in right knee: Secondary | ICD-10-CM | POA: Diagnosis present

## 2023-06-12 DIAGNOSIS — M25562 Pain in left knee: Secondary | ICD-10-CM | POA: Diagnosis present

## 2023-06-12 DIAGNOSIS — M961 Postlaminectomy syndrome, not elsewhere classified: Secondary | ICD-10-CM | POA: Diagnosis present

## 2023-06-12 DIAGNOSIS — M79605 Pain in left leg: Secondary | ICD-10-CM

## 2023-06-12 DIAGNOSIS — M542 Cervicalgia: Secondary | ICD-10-CM

## 2023-06-12 DIAGNOSIS — M545 Low back pain, unspecified: Secondary | ICD-10-CM | POA: Diagnosis not present

## 2023-06-12 DIAGNOSIS — M79604 Pain in right leg: Secondary | ICD-10-CM | POA: Diagnosis not present

## 2023-06-12 DIAGNOSIS — G8929 Other chronic pain: Secondary | ICD-10-CM | POA: Diagnosis present

## 2023-06-12 DIAGNOSIS — Z79899 Other long term (current) drug therapy: Secondary | ICD-10-CM

## 2023-06-12 DIAGNOSIS — G894 Chronic pain syndrome: Secondary | ICD-10-CM | POA: Diagnosis present

## 2023-06-12 DIAGNOSIS — M5412 Radiculopathy, cervical region: Secondary | ICD-10-CM | POA: Diagnosis present

## 2023-06-12 DIAGNOSIS — Z79891 Long term (current) use of opiate analgesic: Secondary | ICD-10-CM | POA: Diagnosis present

## 2023-06-12 DIAGNOSIS — M47812 Spondylosis without myelopathy or radiculopathy, cervical region: Secondary | ICD-10-CM | POA: Diagnosis present

## 2023-06-12 MED ORDER — OXYCODONE HCL 20 MG PO TABS
20.0000 mg | ORAL_TABLET | Freq: Three times a day (TID) | ORAL | 0 refills | Status: DC | PRN
Start: 2023-08-16 — End: 2023-09-05

## 2023-06-12 MED ORDER — NALOXONE HCL 4 MG/0.1ML NA LIQD
1.0000 | NASAL | 0 refills | Status: DC | PRN
Start: 2023-06-12 — End: 2024-07-07

## 2023-06-12 MED ORDER — OXYCODONE HCL 20 MG PO TABS
20.0000 mg | ORAL_TABLET | Freq: Three times a day (TID) | ORAL | 0 refills | Status: DC | PRN
Start: 2023-07-17 — End: 2023-08-29

## 2023-06-12 MED ORDER — OXYCODONE HCL 20 MG PO TABS
20.0000 mg | ORAL_TABLET | Freq: Three times a day (TID) | ORAL | 0 refills | Status: DC | PRN
Start: 2023-06-17 — End: 2023-08-29

## 2023-06-12 NOTE — Progress Notes (Signed)
Safety precautions to be maintained throughout the outpatient stay will include: orient to surroundings, keep bed in low position, maintain call bell within reach at all times, provide assistance with transfer out of bed and ambulation.  

## 2023-06-12 NOTE — Progress Notes (Signed)
Nursing Pain Medication Assessment:  Safety precautions to be maintained throughout the outpatient stay will include: orient to surroundings, keep bed in low position, maintain call bell within reach at all times, provide assistance with transfer out of bed and ambulation.  Medication Inspection Compliance: Pill count conducted under aseptic conditions, in front of the patient. Neither the pills nor the bottle was removed from the patient's sight at any time. Once count was completed pills were immediately returned to the patient in their original bottle.  Medication: Oxycodone/APAP Pill/Patch Count:  15 of 90 pills remain Pill/Patch Appearance: Markings consistent with prescribed medication Bottle Appearance: Standard pharmacy container. Clearly labeled. Filled Date: 7 / 13 / 2024 Last Medication intake:  Today

## 2023-08-29 NOTE — Patient Instructions (Signed)

## 2023-08-29 NOTE — Progress Notes (Signed)
(  09/02/2023) NO-SHOW to medication management encounter.

## 2023-09-02 ENCOUNTER — Ambulatory Visit (HOSPITAL_BASED_OUTPATIENT_CLINIC_OR_DEPARTMENT_OTHER): Payer: Medicare Other | Admitting: Pain Medicine

## 2023-09-02 DIAGNOSIS — Z91199 Patient's noncompliance with other medical treatment and regimen due to unspecified reason: Secondary | ICD-10-CM

## 2023-09-02 DIAGNOSIS — Z79891 Long term (current) use of opiate analgesic: Secondary | ICD-10-CM

## 2023-09-02 DIAGNOSIS — G894 Chronic pain syndrome: Secondary | ICD-10-CM

## 2023-09-02 DIAGNOSIS — M542 Cervicalgia: Secondary | ICD-10-CM

## 2023-09-02 DIAGNOSIS — G8929 Other chronic pain: Secondary | ICD-10-CM

## 2023-09-02 DIAGNOSIS — M47812 Spondylosis without myelopathy or radiculopathy, cervical region: Secondary | ICD-10-CM

## 2023-09-02 DIAGNOSIS — M545 Low back pain, unspecified: Secondary | ICD-10-CM

## 2023-09-02 DIAGNOSIS — M961 Postlaminectomy syndrome, not elsewhere classified: Secondary | ICD-10-CM

## 2023-09-02 DIAGNOSIS — Z79899 Other long term (current) drug therapy: Secondary | ICD-10-CM

## 2023-09-04 ENCOUNTER — Telehealth: Payer: Self-pay | Admitting: Pain Medicine

## 2023-09-04 NOTE — Telephone Encounter (Signed)
Patient has been sick and missed his med mgmt appt this week. He will be out of medications on 09-15-23. He has no one to bring him up today or tomorrow, is unable to drive. Cannot sit up for very long or he can't breathe.   Please ask Dr Laban Emperor if I can add him on as a phone visit for this afternoon. Then I can make him an appt for Nov to come in to clinic.

## 2023-09-05 ENCOUNTER — Ambulatory Visit: Payer: Medicare Other | Attending: Pain Medicine | Admitting: Pain Medicine

## 2023-09-05 DIAGNOSIS — Z79891 Long term (current) use of opiate analgesic: Secondary | ICD-10-CM

## 2023-09-05 DIAGNOSIS — M79604 Pain in right leg: Secondary | ICD-10-CM

## 2023-09-05 DIAGNOSIS — M25561 Pain in right knee: Secondary | ICD-10-CM

## 2023-09-05 DIAGNOSIS — G8929 Other chronic pain: Secondary | ICD-10-CM

## 2023-09-05 DIAGNOSIS — Z79899 Other long term (current) drug therapy: Secondary | ICD-10-CM

## 2023-09-05 DIAGNOSIS — M545 Low back pain, unspecified: Secondary | ICD-10-CM

## 2023-09-05 DIAGNOSIS — M542 Cervicalgia: Secondary | ICD-10-CM | POA: Diagnosis not present

## 2023-09-05 DIAGNOSIS — M79605 Pain in left leg: Secondary | ICD-10-CM | POA: Diagnosis not present

## 2023-09-05 DIAGNOSIS — M4722 Other spondylosis with radiculopathy, cervical region: Secondary | ICD-10-CM

## 2023-09-05 DIAGNOSIS — M47812 Spondylosis without myelopathy or radiculopathy, cervical region: Secondary | ICD-10-CM

## 2023-09-05 DIAGNOSIS — M961 Postlaminectomy syndrome, not elsewhere classified: Secondary | ICD-10-CM

## 2023-09-05 DIAGNOSIS — G894 Chronic pain syndrome: Secondary | ICD-10-CM

## 2023-09-05 DIAGNOSIS — M25562 Pain in left knee: Secondary | ICD-10-CM

## 2023-09-05 MED ORDER — OXYCODONE HCL 20 MG PO TABS
20.0000 mg | ORAL_TABLET | Freq: Three times a day (TID) | ORAL | 0 refills | Status: DC | PRN
Start: 2023-09-15 — End: 2023-10-13

## 2023-09-05 NOTE — Progress Notes (Signed)
Patient: James [name]  Service Category: E/M  Provider: Oswaldo Done, MD  DOB: 13-Jan-1956  DOS: 09/05/2023  Location: Office  MRN: [id number]  Setting: Ambulatory outpatient  Referring Provider: Netta Neat, MD  Type: Established Patient  Specialty: Interventional Pain Management  PCP: Netta Neat, MD  Location: Remote location  Delivery: TeleHealth     Virtual Encounter - Pain Management PROVIDER NOTE: Information contained herein reflects review and annotations entered in association with encounter. Interpretation of such information and data should be left to medically-trained personnel. Information provided to patient can be located elsewhere in the medical record under "Patient Instructions". Document created using STT-dictation technology, any transcriptional errors that may result from process are unintentional.    Contact & Pharmacy Preferred: [phone] Home: [phone] (home) Mobile: [phone] (mobile) E-mail: [email or web address]  [facility] - [place], [place] - [address] [place] [address] Phone: [phone] Fax: [phone]   Pre-screening  James [name] offered "in-person" vs "virtual" encounter. He indicated preferring virtual for this encounter.   Reason COVID-19*  Social distancing based on CDC and AMA recommendations.   I contacted James [name] on 09/05/2023 via telephone.      I clearly identified myself as Oswaldo Done, MD. I verified that I was speaking with the correct person using two identifiers (Name: James [name], and date of birth: 09-14-1956).  Consent I sought verbal advanced consent from James [name] for virtual visit interactions. I informed James [name] of possible security and privacy concerns, risks, and limitations associated with providing "not-in-person" medical evaluation and management services. I also informed James [name] of the availability of  "in-person" appointments. Finally, I informed him that there would be a charge for the virtual visit and that he could be  personally, fully or partially, financially responsible for it. James [name] expressed understanding and agreed to proceed.   Historic Elements   James [name] is a 67 y.o. year old, male patient evaluated today after our last contact on 09/04/2023. 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 He also  has a past surgical history that includes Spine surgery; Eye surgery; Fracture surgery (Right); Fracture surgery (Bilateral); Fracture surgery (Bilateral); Joint replacement (Right); Spinal cord stimulator implant; Spinal cord stimulator removal; Patella arthroplasty; Shoulder arthroscopy (Bilateral); Esophagogastroduodenoscopy (egd) with propofol (N/A, 01/20/2016); and Colonoscopy (2009). James [name] has a current medication list which includes the following prescription(s): albuterol, b complex vitamins, cholecalciferol, ergocalciferol, levothyroxine, loratadine, metoprolol succinate, multivitamin, naloxone, [START ON 09/15/2023] oxycodone hcl, and tizanidine. He  reports that he has quit smoking. His smoking use included cigarettes. His smokeless tobacco use includes chew. He reports that he does not drink alcohol and does not use drugs. James [name] is allergic to gabapentin, penicillins, lactose, nsaids, talwin [pentazocine], codeine, procaine, and sulfa  antibiotics.  BMI: Estimated body mass index is 37.66 kg/m as calculated from the following:   Height as of 06/12/23: 5\' 11"  (1.803 m).   Weight as of 06/12/23: 270 lb (122.5 kg). Last encounter: 09/02/2023. Last procedure: Visit date not found.  HPI  Today, he  is being contacted for medication management. Patient called yesterday indicating that he is sick and cannot drive. Has no ride. Encounter switched to virtual since he will be running out of meds on 09/15/2023.  The patient indicates doing well with the current medication regimen. No adverse reactions or side effects reported to the medications.  In talking to the patient today he was definitely very short of breath.  He describes having a pneumonia.  I have sent a prescription for 30 days on his medicine we will be seeing him before 10/15/2023.  RTCB: 10/15/2023   Pharmacotherapy Assessment   Opioid Analgesic: Oxycodone IR 20 mg, 1 tab PO q 8 hrs (60 mg/day of oxycodone) MME/day: 120 mg/day.   Monitoring: [place] PMP: PDMP reviewed during this encounter.       Pharmacotherapy: No side-effects or adverse reactions reported. Compliance: No problems identified. Effectiveness: Clinically acceptable. Plan: Refer to "POC". UDS:  Summary  Date Value Ref Range Status  03/13/2023 Note  Final    Comment:    ==================================================================== [employer] Select 13 (MW) ==================================================================== Test                             Result       Flag       Units  Drug Present and Declared for Prescription Verification   Oxycodone                      1860         EXPECTED   ng/mg creat   Oxymorphone                    2416         EXPECTED   ng/mg creat   Noroxycodone                   5174         EXPECTED   ng/mg creat   Noroxymorphone                 828          EXPECTED   ng/mg creat    Sources of oxycodone are scheduled prescription medications.    Oxymorphone,  noroxycodone, and noroxymorphone are expected    metabolites of oxycodone. Oxymorphone is also available as a    scheduled prescription medication.  ==================================================================== Test                      Result    Flag   Units      Ref Range   Creatinine              134              mg/dL      >=29 ==================================================================== Declared Medications:  The flagging and interpretation on this report are based on the  following declared medications.  Unexpected results may arise from  inaccuracies in the declared medications.   **Note: The testing scope of this panel includes these medications:   Oxycodone   **Note: The testing scope of this panel does not include the  following reported medications:   Albuterol (Ventolin HFA)  Cholecalciferol  Levothyroxine (Synthroid)  Loratadine (Claritin)  Metoprolol (Toprol)  Multivitamin  Naloxone (Narcan)  Tizanidine (Zanaflex)  Vitamin B  Vitamin D2 (Ergocalciferol) ==================================================================== For clinical consultation, please call [phone]. ====================================================================    No results  found for: "CBDTHCR", "D8THCCBX", "D9THCCBX"   Laboratory Chemistry Profile   Renal Lab Results  Component Value Date   BUN 15 08/19/2019   CREATININE 1.47 (H) 08/19/2019   GFRAA 59 (L) 08/19/2019   GFRNONAA 51 (L) 08/19/2019    Hepatic Lab Results  Component Value Date   AST 25 08/19/2019   ALT 24 08/19/2019   ALBUMIN 4.2 08/19/2019   ALKPHOS 47 08/19/2019    Electrolytes Lab Results  Component Value Date   NA 138 08/19/2019   K 4.1 08/19/2019   CL 100 08/19/2019   CALCIUM 9.3 08/19/2019   MG 2.1 08/19/2019    Bone Lab Results  Component Value Date   VD25OH 26.19 (L) 08/19/2019   25OHVITD1 42 12/06/2016   25OHVITD2 <1.0 12/06/2016   25OHVITD3 42 12/06/2016    TESTOSTERONE 285 02/12/2018    Inflammation (CRP: Acute Phase) (ESR: Chronic Phase) Lab Results  Component Value Date   CRP <0.8 08/19/2019   ESRSEDRATE 1 08/19/2019         Note: Above Lab results reviewed.  Imaging  MR CERVICAL SPINE WO CONTRAST CLINICAL DATA:  Neck pain worse on the right with radiculopathy. Numbness in the arms and hands with worsening symptoms in the past 6 months.  EXAM: MRI CERVICAL SPINE WITHOUT CONTRAST  TECHNIQUE: Multiplanar, multisequence MR imaging of the cervical spine was performed. No intravenous contrast was administered.  COMPARISON:  12/13/2016  FINDINGS: Alignment: Chronic cervical spine straightening. Unchanged minimal anterolisthesis of C3 on C4.  Vertebrae: No fracture or suspicious osseous lesion. Chronic degenerative endplate changes at C6-7 with decreased degenerative endplate edema.  Cord: Normal signal.  Posterior Fossa, vertebral arteries, paraspinal tissues: Unremarkable.  Disc levels:  C2-3: Mild right and moderate to severe left facet arthrosis without stenosis, unchanged.  C3-4: Central disc protrusion, uncovertebral spurring, and severe right and moderate left facet arthrosis result in borderline spinal stenosis with slight ventral cord flattening and moderate right neural foraminal stenosis, unchanged.  C4-5: Minimal disc bulging, mild uncovertebral spurring, and moderate right facet arthrosis without stenosis, unchanged.  C5-6: Moderate disc space narrowing. Disc bulging, uncovertebral spurring, and moderate right and mild left facet arthrosis result in mild spinal stenosis and moderate right greater than left neural foraminal stenosis, unchanged.  C6-7: Severe disc space narrowing. Broad-based posterior disc osteophyte complex results in mild spinal stenosis and mild-to-moderate right and borderline left neural foraminal stenosis, unchanged.  C7-T1: Minimal disc bulging and moderate to severe right  and mild left facet arthrosis result in mild right neural foraminal stenosis without spinal stenosis, unchanged.  IMPRESSION: 1. Decreased degenerative endplate edema at W0-9. 2. Otherwise unchanged appearance of the cervical spine with up to mild spinal and moderate neural foraminal stenosis at multiple levels as above.  Electronically Signed   By: Sebastian Ache M.D.   On: 09/19/2018 10:30  Assessment  Diagnoses of Chronic low back pain (1ry area of Pain) (Bilateral) (R>L), Chronic lower extremity pain (2ry area of Pain) (Bilateral) (L>R), Cervicalgia (Right), Cervical spondylosis (C7 intravertebral body cyst), Chronic knee pain (Bilateral) (R>L), 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 Roys), Chronic cervical radicular pain (Right), Chronic pain syndrome, Pharmacologic therapy, Chronic use of opiate for therapeutic purpose, Encounter for medication management, and Encounter for chronic pain management were pertinent to this visit.  Plan of Care  Problem-specific:  No problem-specific Assessment & Plan notes found for this encounter.  James [name] has a current medication list which includes the following long-term medication(s): albuterol,  naloxone, [START ON 09/15/2023] oxycodone hcl, and tizanidine.  Pharmacotherapy (Medications Ordered): Meds ordered this encounter  Medications   Oxycodone HCl 20 MG TABS    Sig: Take 1 tablet (20 mg total) by mouth every 8 (eight) hours as needed. Must last 30 days    Dispense:  90 tablet    Refill:  0    DO NOT: delete (not duplicate); no partial-fill (will deny script to complete), no refill request (F/U required). DISPENSE: 1 day early if closed on fill date. WARN: No CNS-depressants within 8 hrs of med.   Orders:  No orders of the defined types were placed in this encounter.  Follow-up plan:   Return in about 6 weeks (around 10/15/2023) for Eval-day (M,W), (F2F), (MM).      Interventional Therapies  Risks   Complex Considerations:   A-fib; type 2 diabetes; low T; OSA; aortic valve insufficiency; opiate use (120 MME/day)   Planned  Pending:      Under consideration:      Completed:   Therapeutic left ring trigger finger + right middle trigger finger inj. x1 (09/17/2022) (100/100/100/L:30R:90)  Therapeutic right L4-5 LESI x1 (06/26/2016) (50/50/25)  Therapeutic bilateral lumbar facet MBB x2 (04/15/2017) (90/90/50/50)  Therapeutic left lumbar facet RFA x1 (07/18/17) (90/90/90/90)    Completed by other providers:   Lumbar spine surgery (01/23/2022). EMG & NCV (06/20/2016) by Theora Master, MD [facility]) Dx: Generalized LE polyneuropathy + superimposed right lumbosacral radiculopathy   Therapeutic  Palliative (PRN) options:   Palliative right CESI  Palliative right L4-5 LESI #2   Palliative bilateral lumbar facet MBB  Palliative left lumbar facet RFA #2  Palliative bilateral cervical facet MBB    Pharmacological Recommendations:   Nonopioids transferred 08/31/2020: Zanaflex      Recent Visits Date Type Provider Dept  09/02/23 Office Visit Delano Metz, MD [facility]  06/12/23 Office Visit Delano Metz, MD [facility]  Showing recent visits within past 90 days and meeting all other requirements Today's Visits Date Type Provider Dept  09/05/23 Office Visit Delano Metz, MD [facility]  Showing today's visits and meeting all other requirements Future Appointments No visits were found meeting these conditions. Showing future appointments within next 90 days and meeting all other requirements  I discussed the assessment and treatment plan with the patient. The patient was provided an opportunity to ask questions and all were answered. The patient agreed with the plan and demonstrated an understanding of the instructions.  Patient advised to call back or seek an in-person evaluation if the symptoms or condition worsens.  Duration  of encounter: 12 minutes.  Note by: Oswaldo Done, MD Date: 09/05/2023; Time: 2:41 PM

## 2023-09-05 NOTE — Patient Instructions (Signed)

## 2023-10-13 NOTE — Progress Notes (Unsigned)
PROVIDER NOTE: Information contained herein reflects review and annotations entered in association with encounter. Interpretation of such information and data should be left to medically-trained personnel. Information provided to patient can be located elsewhere in the medical record under "Patient Instructions". Document created using STT-dictation technology, any transcriptional errors that may result from process are unintentional.    Patient: James [name]  Service Category: E/M  Provider: Oswaldo Done, MD  DOB: 1956/07/04  DOS: 10/14/2023  Referring Provider: Netta Neat, MD  MRN: [id number]  Specialty: Interventional Pain Management  PCP: James Neat, MD  Type: Established Patient  Setting: Ambulatory outpatient    Location: Office  Delivery: Face-to-face     HPI  [name], a 67 y.o. year old male, is here today because of his No primary diagnosis found.. James [name]'s primary complain today is No chief complaint on file.  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 [name]); Epidural fibrosis; Chronic musculoskeletal pain; Neurogenic pain; Neuropathic pain; Chronic sacroiliac joint pain (Bilateral) (R>L); Psoriatic arthritis (HCC); Chronic lower extremity pain  (2ry area of Pain) (Bilateral) (L>R); Psoriasis with arthropathy (HCC); Numbness and tingling of both legs; Abnormal nerve conduction studies (06/20/16); Chronic pain syndrome; Perineal pain; Chest pain with high risk for cardiac etiology; Chronic upper extremity pain(R>L); Cervicalgia; DDD (degenerative disc disease), cervical; Closed fracture of lumbar vertebral body (HCC) (L2); Neurogenic bladder s/p L2 fracture (04/19/2020); Lumbosacral radiculopathy at L5 (Right); Compression fracture of L2 lumbar vertebra, sequela; Abnormal MRI, cervical spine (09/19/2018); Trigger finger, ring finger (Left); Occipital neuralgia (midline); Trigger finger, ring finger (Right); Trigger finger, middle finger (Right); and Right foot drop on their pertinent problem list. Pain Assessment: Severity of   is reported as a  /10. Location:    / . Onset:  . Quality:  . Timing:  . Modifying factor(s):  Marland Kitchen Vitals:  vitals were not taken for this visit.  BMI: Estimated body mass index is 37.66 kg/m as calculated from the following:   Height as of 06/12/23: 5\' 11"  (1.803 m).   Weight as of 06/12/23: 270 lb (122.5 kg). Last encounter: 09/05/2023. Last procedure: Visit date not found.  Reason for encounter: medication management. ***  Discussed the use of AI scribe software for clinical note transcription with the patient, who gave verbal consent to proceed.  History of Present Illness         RTCB: 01/13/2024   Pharmacotherapy Assessment  Analgesic: Oxycodone IR 20 mg, 1 tab PO q 8 hrs (60 mg/day of oxycodone) MME/day: 120 mg/day.   Monitoring: [place] PMP: PDMP reviewed during this encounter.       Pharmacotherapy: No side-effects or adverse reactions reported. Compliance: No problems identified. Effectiveness: Clinically acceptable.  No notes on file  No results found for: "CBDTHCR" No results found for: "D8THCCBX" No results found for: "D9THCCBX"  UDS:  Summary  Date Value Ref Range Status  03/13/2023 Note  Final     Comment:    ====================================================================  [employer] Select 13 (MW) ==================================================================== Test                             Result       Flag       Units  Drug Present and Declared for Prescription Verification   Oxycodone                      1860         EXPECTED   ng/mg creat   Oxymorphone                    2416         EXPECTED   ng/mg creat   Noroxycodone                   5174         EXPECTED   ng/mg creat   Noroxymorphone                 828          EXPECTED   ng/mg creat    Sources of oxycodone are scheduled prescription medications.    Oxymorphone, noroxycodone, and noroxymorphone are expected    metabolites of oxycodone. Oxymorphone is also available as a    scheduled prescription medication.  ==================================================================== Test                      Result    Flag   Units      Ref Range   Creatinine              134              mg/dL      >=16 ==================================================================== Declared Medications:  The flagging and interpretation on this report are based on the  following declared medications.  Unexpected results may arise from  inaccuracies in the declared medications.   **Note: The testing scope of this panel includes these medications:   Oxycodone   **Note: The testing scope of this panel does not include the  following reported medications:   Albuterol (Ventolin HFA)  Cholecalciferol  Levothyroxine (Synthroid)  Loratadine (Claritin)  Metoprolol (Toprol)  Multivitamin  Naloxone (Narcan)  Tizanidine (Zanaflex)  Vitamin B  Vitamin D2 (Ergocalciferol) ==================================================================== For clinical consultation, please call [phone]. ====================================================================       ROS  Constitutional: Denies any fever or  chills Gastrointestinal: No reported hemesis, hematochezia, vomiting, or acute GI distress Musculoskeletal: Denies any acute onset joint swelling, redness, loss of ROM, or weakness Neurological: No reported episodes of acute onset apraxia, aphasia, dysarthria, agnosia, amnesia, paralysis, loss of coordination, or loss of consciousness  Medication Review  Cholecalciferol, Ergocalciferol, Oxycodone HCl, albuterol, b complex vitamins, levothyroxine, loratadine, metoprolol succinate, multivitamin, naloxone, and tiZANidine  History Review  Allergy: James [name] is allergic to gabapentin, penicillins, lactose, nsaids, talwin [pentazocine], codeine, procaine, and sulfa antibiotics. Drug: James [name]  reports no history of drug use. Alcohol:  reports no history of alcohol use. Tobacco:  reports that he has quit smoking. His smoking use included cigarettes. His smokeless tobacco use includes chew. Social: James [name]  reports that he has quit smoking. His smoking use included cigarettes. His smokeless tobacco use includes chew. He reports that he does not drink alcohol and does not use drugs. Medical:  has a past medical history of Allergy, Anemia, Arthritis, Asthma, Blood transfusion  without reported diagnosis, Bronchitis, Bursitis, Cancer (HCC), Cataract, 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09/13/2015), Emphysema of lung (HCC), Gastritis, GERD (gastroesophageal reflux disease), Heart murmur, Hepatitis C, Hiatal hernia, Hypertension, Hypothyroidism, Kidney stone, Lupus (HCC), Obesity, Osteoporosis, Peripheral nerve disease, Psoriatic arthritis (HCC), Sleep apnea, Supraventricular tachycardia, Tendonitis, and Thyroid disease. Surgical: James [name]  has a past surgical history that includes Spine surgery; Eye surgery; Fracture surgery (Right); Fracture surgery (Bilateral); Fracture surgery (Bilateral); Joint replacement (Right); Spinal cord stimulator implant; Spinal cord  stimulator removal; Patella arthroplasty; Shoulder arthroscopy (Bilateral); Esophagogastroduodenoscopy (egd) with propofol (N/A, 01/20/2016); and Colonoscopy (2009). Family: family history includes Arthritis in his father and mother; COPD in his father; Cancer in his father and mother; Diabetes in his mother; Hematuria in his father and mother; Hypertension in his father and mother; Kidney disease in his mother; Kidney failure in his mother; Prostate cancer in his father; Stroke in his mother.  Laboratory Chemistry Profile   Renal Lab Results  Component Value Date   BUN 15 08/19/2019   CREATININE 1.47 (H) 08/19/2019   GFRAA 59 (L) 08/19/2019   GFRNONAA 51 (L) 08/19/2019    Hepatic Lab Results  Component Value Date   AST 25 08/19/2019   ALT 24 08/19/2019   ALBUMIN 4.2 08/19/2019   ALKPHOS 47 08/19/2019    Electrolytes Lab Results  Component Value Date   NA 138 08/19/2019   K 4.1 08/19/2019   CL 100 08/19/2019   CALCIUM 9.3 08/19/2019   MG 2.1 08/19/2019    Bone Lab Results  Component Value Date   VD25OH 26.19 (L) 08/19/2019   25OHVITD1 42 12/06/2016   25OHVITD2 <1.0 12/06/2016   25OHVITD3 42 12/06/2016   TESTOSTERONE 285 02/12/2018    Inflammation (CRP: Acute Phase) (ESR: Chronic Phase) Lab Results  Component Value Date   CRP <0.8 08/19/2019   ESRSEDRATE 1 08/19/2019         Note: Above Lab results reviewed.  Recent Imaging Review  MR CERVICAL SPINE WO CONTRAST CLINICAL DATA:  Neck pain worse on the right with radiculopathy. Numbness in the arms and hands with worsening symptoms in the past 6 months.  EXAM: MRI CERVICAL SPINE WITHOUT CONTRAST  TECHNIQUE: Multiplanar, multisequence MR imaging of the cervical spine was performed. No intravenous contrast was administered.  COMPARISON:  12/13/2016  FINDINGS: Alignment: Chronic cervical spine straightening. Unchanged minimal anterolisthesis of C3 on C4.  Vertebrae: No fracture or suspicious osseous lesion.  Chronic degenerative endplate changes at C6-7 with decreased degenerative endplate edema.  Cord: Normal signal.  Posterior Fossa, vertebral arteries, paraspinal tissues: Unremarkable.  Disc levels:  C2-3: Mild right and moderate to severe left facet arthrosis without stenosis, unchanged.  C3-4: Central disc protrusion, uncovertebral spurring, and severe right and moderate left facet arthrosis result in borderline spinal stenosis with slight ventral cord flattening and moderate right neural foraminal stenosis, unchanged.  C4-5: Minimal disc bulging, mild uncovertebral spurring, and moderate right facet arthrosis without stenosis, unchanged.  C5-6: Moderate disc space narrowing. Disc bulging, uncovertebral spurring, and moderate right and mild left facet arthrosis result in mild spinal stenosis and moderate right greater than left neural foraminal stenosis, unchanged.  C6-7: Severe disc space narrowing. Broad-based posterior disc osteophyte complex results in mild spinal stenosis and mild-to-moderate right and borderline left neural foraminal stenosis, unchanged.  C7-T1: Minimal disc bulging and moderate to severe right and mild left facet arthrosis result in mild right neural foraminal stenosis without spinal stenosis, unchanged.  IMPRESSION: 1. Decreased degenerative endplate  edema at C6-7. 2. Otherwise unchanged appearance of the cervical spine with up to mild spinal and moderate neural foraminal stenosis at multiple levels as above.  Electronically Signed   By: Sebastian Ache M.D.   On: 09/19/2018 10:30 Note: Reviewed        Physical Exam  General appearance: Well nourished, well developed, and well hydrated. In no apparent acute distress Mental status: Alert, oriented x 3 (person, place, & time)       Respiratory: No evidence of acute respiratory distress Eyes: PERLA Vitals: There were no vitals taken for this visit. BMI: Estimated body mass index is 37.66 kg/m  as calculated from the following:   Height as of 06/12/23: 5\' 11"  (1.803 m).   Weight as of 06/12/23: 270 lb (122.5 kg). Ideal: Patient weight not recorded  Assessment   Diagnosis Status  1. Chronic low back pain (1ry area of Pain) (Bilateral) (R>L)   2. Chronic lower extremity pain (2ry area of Pain) (Bilateral) (L>R)   3. Cervicalgia (Right)   4. Cervical spondylosis (C7 intravertebral body cyst)   5. Chronic knee pain (Bilateral) (R>L)   6. 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 [name])   7. Chronic cervical radicular pain (Right)   8. Chronic pain syndrome   9. Pharmacologic therapy   10. Chronic use of opiate for therapeutic purpose   11. Encounter for medication management   12. Encounter for chronic pain management    Controlled Controlled Controlled   Updated Problems: No problems updated.  Plan of Care  Problem-specific:  Assessment and Plan            James [name] has a current medication list which includes the following long-term medication(s): albuterol, naloxone, oxycodone hcl, and tizanidine.  Pharmacotherapy (Medications Ordered): No orders of the defined types were placed in this encounter.  Orders:  No orders of the defined types were placed in this encounter.  Follow-up plan:   No follow-ups on file.      Interventional Therapies  Risks  Complex Considerations:   A-fib; type 2 diabetes; low T; OSA; aortic valve insufficiency; opiate use (120 MME/day)   Planned  Pending:      Under consideration:      Completed:   Therapeutic left ring trigger finger + right middle trigger finger inj. x1 (09/17/2022) (100/100/100/L:30R:90)  Therapeutic right L4-5 LESI x1 (06/26/2016) (50/50/25)  Therapeutic bilateral lumbar facet MBB x2 (04/15/2017) (90/90/50/50)  Therapeutic left lumbar facet RFA x1 (07/18/17) (90/90/90/90)    Completed by other providers:   Lumbar spine surgery (01/23/2022). EMG & NCV (06/20/2016) by Theora Master,  MD [facility] neurology) Dx: Generalized LE polyneuropathy + superimposed right lumbosacral radiculopathy   Therapeutic  Palliative (PRN) options:   Palliative right CESI  Palliative right L4-5 LESI #2   Palliative bilateral lumbar facet MBB  Palliative left lumbar facet RFA #2  Palliative bilateral cervical facet MBB    Pharmacological Recommendations:   Nonopioids transferred 08/31/2020: Zanaflex       Recent Visits Date Type Provider Dept  09/05/23 Office Visit Delano Metz, MD [facility]  Showing recent visits within past 90 days and meeting all other requirements Future Appointments Date Type Provider Dept  10/14/23 Appointment Delano Metz, MD [facility]  Showing future appointments within next 90 days and meeting all other requirements  I discussed the assessment and treatment plan with the patient. The patient was provided an opportunity to ask questions and all were answered. The  patient agreed with the plan and demonstrated an understanding of the instructions.  Patient advised to call back or seek an in-person evaluation if the symptoms or condition worsens.  Duration of encounter: *** minutes.  Total time on encounter, as per AMA guidelines included both the face-to-face and non-face-to-face time personally spent by the physician and/or other qualified health care professional(s) on the day of the encounter (includes time in activities that require the physician or other qualified health care professional and does not include time in activities normally performed by clinical staff). Physician's time may include the following activities when performed: Preparing to see the patient (e.g., pre-charting review of records, searching for previously ordered imaging, lab work, and nerve conduction tests) Review of prior analgesic pharmacotherapies. Reviewing PMP Interpreting ordered tests (e.g., lab work, imaging, nerve conduction tests) Performing  post-procedure evaluations, including interpretation of diagnostic procedures Obtaining and/or reviewing separately obtained history Performing a medically appropriate examination and/or evaluation Counseling and educating the patient/family/caregiver Ordering medications, tests, or procedures Referring and communicating with other health care professionals (when not separately reported) Documenting clinical information in the electronic or other health record Independently interpreting results (not separately reported) and communicating results to the patient/ family/caregiver Care coordination (not separately reported)  Note by: James Done, MD Date: 10/14/2023; Time: 6:23 PM

## 2023-10-13 NOTE — Patient Instructions (Signed)

## 2023-10-14 ENCOUNTER — Encounter: Payer: Self-pay | Admitting: Pain Medicine

## 2023-10-14 ENCOUNTER — Ambulatory Visit: Payer: Medicare Other | Attending: Pain Medicine | Admitting: Pain Medicine

## 2023-10-14 DIAGNOSIS — M25561 Pain in right knee: Secondary | ICD-10-CM | POA: Diagnosis present

## 2023-10-14 DIAGNOSIS — M5412 Radiculopathy, cervical region: Secondary | ICD-10-CM | POA: Diagnosis present

## 2023-10-14 DIAGNOSIS — Z79891 Long term (current) use of opiate analgesic: Secondary | ICD-10-CM | POA: Insufficient documentation

## 2023-10-14 DIAGNOSIS — G8929 Other chronic pain: Secondary | ICD-10-CM | POA: Diagnosis present

## 2023-10-14 DIAGNOSIS — M79604 Pain in right leg: Secondary | ICD-10-CM | POA: Diagnosis present

## 2023-10-14 DIAGNOSIS — M961 Postlaminectomy syndrome, not elsewhere classified: Secondary | ICD-10-CM | POA: Insufficient documentation

## 2023-10-14 DIAGNOSIS — Z79899 Other long term (current) drug therapy: Secondary | ICD-10-CM | POA: Insufficient documentation

## 2023-10-14 DIAGNOSIS — M47812 Spondylosis without myelopathy or radiculopathy, cervical region: Secondary | ICD-10-CM | POA: Insufficient documentation

## 2023-10-14 DIAGNOSIS — M79605 Pain in left leg: Secondary | ICD-10-CM | POA: Diagnosis present

## 2023-10-14 DIAGNOSIS — M25562 Pain in left knee: Secondary | ICD-10-CM | POA: Insufficient documentation

## 2023-10-14 DIAGNOSIS — M542 Cervicalgia: Secondary | ICD-10-CM | POA: Diagnosis present

## 2023-10-14 DIAGNOSIS — G894 Chronic pain syndrome: Secondary | ICD-10-CM | POA: Insufficient documentation

## 2023-10-14 DIAGNOSIS — M545 Low back pain, unspecified: Secondary | ICD-10-CM | POA: Insufficient documentation

## 2023-10-14 MED ORDER — OXYCODONE HCL 20 MG PO TABS
20.0000 mg | ORAL_TABLET | Freq: Three times a day (TID) | ORAL | 0 refills | Status: DC | PRN
Start: 2023-10-15 — End: 2024-01-07

## 2023-10-14 MED ORDER — OXYCODONE HCL 20 MG PO TABS
20.0000 mg | ORAL_TABLET | Freq: Three times a day (TID) | ORAL | 0 refills | Status: DC | PRN
Start: 2023-11-14 — End: 2024-01-07

## 2023-10-14 MED ORDER — OXYCODONE HCL 20 MG PO TABS
20.0000 mg | ORAL_TABLET | Freq: Three times a day (TID) | ORAL | 0 refills | Status: DC | PRN
Start: 2023-12-14 — End: 2024-01-07

## 2023-10-14 NOTE — Progress Notes (Signed)
Nursing Pain Medication Assessment:  Safety precautions to be maintained throughout the outpatient stay will include: orient to surroundings, keep bed in low position, maintain call bell within reach at all times, provide assistance with transfer out of bed and ambulation.  Medication Inspection Compliance: Pill count conducted under aseptic conditions, in front of the patient. Neither the pills nor the bottle was removed from the patient's sight at any time. Once count was completed pills were immediately returned to the patient in their original bottle.  Medication: Oxycodone IR Pill/Patch Count:  05 of 90 pills remain Pill/Patch Appearance: Markings consistent with prescribed medication Bottle Appearance: Standard pharmacy container. Clearly labeled. Filled Date: 43 / 17 / 2024 Last Medication intake:  Today

## 2023-10-15 DIAGNOSIS — I2510 Atherosclerotic heart disease of native coronary artery without angina pectoris: Secondary | ICD-10-CM | POA: Insufficient documentation

## 2023-10-15 DIAGNOSIS — I200 Unstable angina: Secondary | ICD-10-CM | POA: Insufficient documentation

## 2024-01-07 NOTE — Patient Instructions (Signed)

## 2024-01-07 NOTE — Progress Notes (Unsigned)
 PROVIDER NOTE: Information contained herein reflects review and annotations entered in association with encounter. Interpretation of such information and data should be left to medically-trained personnel. Information provided to patient can be located elsewhere in the medical record under "Patient Instructions". Document created using STT-dictation technology, any transcriptional errors that may result from process are unintentional.    Patient: James [name]  Service Category: E/M  Provider: Oswaldo Done, MD  DOB: January 15, 1956  DOS: 01/08/2024  Referring Provider: Netta Neat, MD  MRN: [id number]  Specialty: Interventional Pain Management  PCP: James Neat, MD  Type: Established Patient  Setting: Ambulatory outpatient    Location: Office  Delivery: Face-to-face     HPI  [name], a 68 y.o. year old male, is here today because of his No primary diagnosis found.. James [name]'s primary complain today is No chief complaint on file.  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 [name]); Epidural fibrosis; Chronic musculoskeletal pain; Neurogenic pain; Neuropathic pain; Chronic sacroiliac joint pain (Bilateral) (R>L); Psoriatic arthritis (HCC); Chronic lower extremity pain  (2ry area of Pain) (Bilateral) (L>R); Psoriasis with arthropathy (HCC); Numbness and tingling of both legs; Abnormal nerve conduction studies (06/20/16); Chronic pain syndrome; Perineal pain; Chest pain with high risk for cardiac etiology; Chronic upper extremity pain(R>L); Cervicalgia; DDD (degenerative disc disease), cervical; Closed fracture of lumbar vertebral body (HCC) (L2); Neurogenic bladder s/p L2 fracture (04/19/2020); Lumbosacral radiculopathy at L5 (Right); Compression fracture of L2 lumbar vertebra, sequela; Abnormal MRI, cervical spine (09/19/2018); Trigger finger, ring finger (Left); Occipital neuralgia (midline); Trigger finger, ring finger (Right); Trigger finger, middle finger (Right); and Right foot drop on their pertinent problem list. Pain Assessment: Severity of   is reported as a  /10. Location:    / . Onset:  . Quality:  . Timing:  . Modifying factor(s):  Marland Kitchen Vitals:  vitals were not taken for this visit.  BMI: Estimated body mass index is 37.97 kg/m as calculated from the following:   Height as of 10/14/23: 6' (1.829 m).   Weight as of 10/14/23: 280 lb (127 kg). Last encounter: 10/14/2023. Last procedure: Visit date not found.  Reason for encounter: medication management. ***  Discussed the use of AI scribe software for clinical note transcription with the patient, who gave verbal consent to proceed.  History of Present Illness         04/12/2024   Pharmacotherapy Assessment  Analgesic: Oxycodone IR 20 mg, 1 tab PO q 8 hrs (60 mg/day of oxycodone) MME/day: 120 mg/day.   Monitoring: [place] PMP: PDMP reviewed during this encounter.       Pharmacotherapy: No side-effects or adverse reactions reported. Compliance: No problems identified. Effectiveness: Clinically acceptable.  No notes on file  No results found for: "CBDTHCR" No results found for: "D8THCCBX" No results found for: "D9THCCBX"  UDS:  Summary  Date Value Ref Range Status  03/13/2023 Note  Final    Comment:     ====================================================================  [employer] Select 13 (MW) ==================================================================== Test                             Result       Flag       Units  Drug Present and Declared for Prescription Verification   Oxycodone                      1860         EXPECTED   ng/mg creat   Oxymorphone                    2416         EXPECTED   ng/mg creat   Noroxycodone                   5174         EXPECTED   ng/mg creat   Noroxymorphone                 828          EXPECTED   ng/mg creat    Sources of oxycodone are scheduled prescription medications.    Oxymorphone, noroxycodone, and noroxymorphone are expected    metabolites of oxycodone. Oxymorphone is also available as a    scheduled prescription medication.  ==================================================================== Test                      Result    Flag   Units      Ref Range   Creatinine              134              mg/dL      >=91 ==================================================================== Declared Medications:  The flagging and interpretation on this report are based on the  following declared medications.  Unexpected results may arise from  inaccuracies in the declared medications.   **Note: The testing scope of this panel includes these medications:   Oxycodone   **Note: The testing scope of this panel does not include the  following reported medications:   Albuterol (Ventolin HFA)  Cholecalciferol  Levothyroxine (Synthroid)  Loratadine (Claritin)  Metoprolol (Toprol)  Multivitamin  Naloxone (Narcan)  Tizanidine (Zanaflex)  Vitamin B  Vitamin D2 (Ergocalciferol) ==================================================================== For clinical consultation, please call [phone]. ====================================================================       ROS  Constitutional: Denies any fever or  chills Gastrointestinal: No reported hemesis, hematochezia, vomiting, or acute GI distress Musculoskeletal: Denies any acute onset joint swelling, redness, loss of ROM, or weakness Neurological: No reported episodes of acute onset apraxia, aphasia, dysarthria, agnosia, amnesia, paralysis, loss of coordination, or loss of consciousness  Medication Review  Cholecalciferol, Ergocalciferol, Oxycodone HCl, albuterol, b complex vitamins, levothyroxine, loratadine, metoprolol succinate, multivitamin, naloxone, temazepam, and tiZANidine  History Review  Allergy: James [name] is allergic to gabapentin, penicillins, lactose, nsaids, talwin [pentazocine], codeine, procaine, and sulfa antibiotics. Drug: James [name]  reports no history of drug use. Alcohol:  reports no history of alcohol use. Tobacco:  reports that he has quit smoking. His smoking use included cigarettes. His smokeless tobacco use includes chew. Social: James [name]  reports that he has quit smoking. His smoking use included cigarettes. His smokeless tobacco use includes chew. He reports that he does not drink alcohol and does not use drugs. Medical:  has a past medical history of Allergy, Anemia, Arthritis, Asthma, Blood  transfusion without reported diagnosis, Bronchitis, Bursitis, Cancer (HCC), Cataract, 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09/13/2015), Emphysema of lung (HCC), Gastritis, GERD (gastroesophageal reflux disease), Heart murmur, Hepatitis C, Hiatal hernia, Hypertension, Hypothyroidism, Kidney stone, Lupus, Obesity, Osteoporosis, Peripheral nerve disease, Psoriatic arthritis (HCC), Sleep apnea, Supraventricular tachycardia (HCC), Tendonitis, and Thyroid disease. Surgical: James [name]  has a past surgical history that includes Spine surgery; Eye surgery; Fracture surgery (Right); Fracture surgery (Bilateral); Fracture surgery (Bilateral); Joint replacement (Right); Spinal cord stimulator implant;  Spinal cord stimulator removal; Patella arthroplasty; Shoulder arthroscopy (Bilateral); Esophagogastroduodenoscopy (egd) with propofol (N/A, 01/20/2016); and Colonoscopy (2009). Family: family history includes Arthritis in his father and mother; COPD in his father; Cancer in his father and mother; Diabetes in his mother; Hematuria in his father and mother; Hypertension in his father and mother; Kidney disease in his mother; Kidney failure in his mother; Prostate cancer in his father; Stroke in his mother.  Laboratory Chemistry Profile   Renal Lab Results  Component Value Date   BUN 15 08/19/2019   CREATININE 1.47 (H) 08/19/2019   GFRAA 59 (L) 08/19/2019   GFRNONAA 51 (L) 08/19/2019    Hepatic Lab Results  Component Value Date   AST 25 08/19/2019   ALT 24 08/19/2019   ALBUMIN 4.2 08/19/2019   ALKPHOS 47 08/19/2019    Electrolytes Lab Results  Component Value Date   NA 138 08/19/2019   K 4.1 08/19/2019   CL 100 08/19/2019   CALCIUM 9.3 08/19/2019   MG 2.1 08/19/2019    Bone Lab Results  Component Value Date   VD25OH 26.19 (L) 08/19/2019   25OHVITD1 42 12/06/2016   25OHVITD2 <1.0 12/06/2016   25OHVITD3 42 12/06/2016   TESTOSTERONE 285 02/12/2018    Inflammation (CRP: Acute Phase) (ESR: Chronic Phase) Lab Results  Component Value Date   CRP <0.8 08/19/2019   ESRSEDRATE 1 08/19/2019         Note: Above Lab results reviewed.  Recent Imaging Review  MR CERVICAL SPINE WO CONTRAST CLINICAL DATA:  Neck pain worse on the right with radiculopathy. Numbness in the arms and hands with worsening symptoms in the past 6 months.  EXAM: MRI CERVICAL SPINE WITHOUT CONTRAST  TECHNIQUE: Multiplanar, multisequence MR imaging of the cervical spine was performed. No intravenous contrast was administered.  COMPARISON:  12/13/2016  FINDINGS: Alignment: Chronic cervical spine straightening. Unchanged minimal anterolisthesis of C3 on C4.  Vertebrae: No fracture or suspicious  osseous lesion. Chronic degenerative endplate changes at C6-7 with decreased degenerative endplate edema.  Cord: Normal signal.  Posterior Fossa, vertebral arteries, paraspinal tissues: Unremarkable.  Disc levels:  C2-3: Mild right and moderate to severe left facet arthrosis without stenosis, unchanged.  C3-4: Central disc protrusion, uncovertebral spurring, and severe right and moderate left facet arthrosis result in borderline spinal stenosis with slight ventral cord flattening and moderate right neural foraminal stenosis, unchanged.  C4-5: Minimal disc bulging, mild uncovertebral spurring, and moderate right facet arthrosis without stenosis, unchanged.  C5-6: Moderate disc space narrowing. Disc bulging, uncovertebral spurring, and moderate right and mild left facet arthrosis result in mild spinal stenosis and moderate right greater than left neural foraminal stenosis, unchanged.  C6-7: Severe disc space narrowing. Broad-based posterior disc osteophyte complex results in mild spinal stenosis and mild-to-moderate right and borderline left neural foraminal stenosis, unchanged.  C7-T1: Minimal disc bulging and moderate to severe right and mild left facet arthrosis result in mild right neural foraminal stenosis without spinal stenosis, unchanged.  IMPRESSION: 1. Decreased degenerative  endplate edema at X9-1. 2. Otherwise unchanged appearance of the cervical spine with up to mild spinal and moderate neural foraminal stenosis at multiple levels as above.  Electronically Signed   By: Sebastian Ache M.D.   On: 09/19/2018 10:30 Note: Reviewed        Physical Exam  General appearance: Well nourished, well developed, and well hydrated. In no apparent acute distress Mental status: Alert, oriented x 3 (person, place, & time)       Respiratory: No evidence of acute respiratory distress Eyes: PERLA Vitals: There were no vitals taken for this visit. BMI: Estimated body mass index  is 37.97 kg/m as calculated from the following:   Height as of 10/14/23: 6' (1.829 m).   Weight as of 10/14/23: 280 lb (127 kg). Ideal: Patient weight not recorded  Assessment   Diagnosis Status  1. Chronic low back pain (1ry area of Pain) (Bilateral) (R>L)   2. Chronic lower extremity pain (2ry area of Pain) (Bilateral) (L>R)   3. Cervicalgia (Right)   4. Cervical spondylosis (C7 intravertebral body cyst)   5. Chronic knee pain (Bilateral) (R>L)   6. 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 [name])   7. Chronic cervical radicular pain (Right)   8. Chronic pain syndrome   9. Pharmacologic therapy   10. Chronic use of opiate for therapeutic purpose   11. Encounter for medication management   12. Encounter for chronic pain management    Controlled Controlled Controlled   Updated Problems: No problems updated.  Plan of Care  Problem-specific:  Assessment and Plan            James [name] has a current medication list which includes the following long-term medication(s): albuterol, naloxone, oxycodone hcl, and tizanidine.  Pharmacotherapy (Medications Ordered): No orders of the defined types were placed in this encounter.  Orders:  No orders of the defined types were placed in this encounter.  Follow-up plan:   No follow-ups on file.      Interventional Therapies  Risks  Complex Considerations:   A-fib; type 2 diabetes; low T; OSA; aortic valve insufficiency; opiate use (120 MME/day)   Planned  Pending:      Under consideration:      Completed:   Therapeutic left ring trigger finger + right middle trigger finger inj. x1 (09/17/2022) (100/100/100/L:30R:90)  Therapeutic right L4-5 LESI x1 (06/26/2016) (50/50/25)  Therapeutic bilateral lumbar facet MBB x2 (04/15/2017) (90/90/50/50)  Therapeutic left lumbar facet RFA x1 (07/18/17) (90/90/90/90)    Completed by other providers:   Lumbar spine surgery (01/23/2022). EMG & NCV (06/20/2016) by  Theora Master, MD [facility] neurology) Dx: Generalized LE polyneuropathy + superimposed right lumbosacral radiculopathy   Therapeutic  Palliative (PRN) options:   Palliative right CESI  Palliative right L4-5 LESI #2   Palliative bilateral lumbar facet MBB  Palliative left lumbar facet RFA #2  Palliative bilateral cervical facet MBB    Pharmacological Recommendations:   Nonopioids transferred 08/31/2020: Zanaflex      Recent Visits Date Type Provider Dept  10/14/23 Office Visit Delano Metz, MD [facility]  Showing recent visits within past 90 days and meeting all other requirements Future Appointments Date Type Provider Dept  01/08/24 Appointment Delano Metz, MD [facility]  Showing future appointments within next 90 days and meeting all other requirements  I discussed the assessment and treatment plan with the patient. The patient was provided an opportunity to ask questions and all were answered. The patient  agreed with the plan and demonstrated an understanding of the instructions.  Patient advised to call back or seek an in-person evaluation if the symptoms or condition worsens.  Duration of encounter: *** minutes.  Total time on encounter, as per AMA guidelines included both the face-to-face and non-face-to-face time personally spent by the physician and/or other qualified health care professional(s) on the day of the encounter (includes time in activities that require the physician or other qualified health care professional and does not include time in activities normally performed by clinical staff). Physician's time may include the following activities when performed: Preparing to see the patient (e.g., pre-charting review of records, searching for previously ordered imaging, lab work, and nerve conduction tests) Review of prior analgesic pharmacotherapies. Reviewing PMP Interpreting ordered tests (e.g., lab work, imaging, nerve conduction  tests) Performing post-procedure evaluations, including interpretation of diagnostic procedures Obtaining and/or reviewing separately obtained history Performing a medically appropriate examination and/or evaluation Counseling and educating the patient/family/caregiver Ordering medications, tests, or procedures Referring and communicating with other health care professionals (when not separately reported) Documenting clinical information in the electronic or other health record Independently interpreting results (not separately reported) and communicating results to the patient/ family/caregiver Care coordination (not separately reported)  Note by: James Done, MD Date: 01/08/2024; Time: 11:16 AM

## 2024-01-08 ENCOUNTER — Encounter: Payer: Self-pay | Admitting: Pain Medicine

## 2024-01-08 ENCOUNTER — Ambulatory Visit: Payer: Medicare Other | Attending: Pain Medicine | Admitting: Pain Medicine

## 2024-01-08 VITALS — BP 143/93 | HR 79 | Temp 98.2°F | Ht 72.0 in | Wt 280.0 lb

## 2024-01-08 DIAGNOSIS — M961 Postlaminectomy syndrome, not elsewhere classified: Secondary | ICD-10-CM | POA: Insufficient documentation

## 2024-01-08 DIAGNOSIS — G8929 Other chronic pain: Secondary | ICD-10-CM | POA: Diagnosis present

## 2024-01-08 DIAGNOSIS — M65331 Trigger finger, right middle finger: Secondary | ICD-10-CM | POA: Diagnosis present

## 2024-01-08 DIAGNOSIS — M25562 Pain in left knee: Secondary | ICD-10-CM | POA: Insufficient documentation

## 2024-01-08 DIAGNOSIS — G894 Chronic pain syndrome: Secondary | ICD-10-CM | POA: Diagnosis not present

## 2024-01-08 DIAGNOSIS — M545 Low back pain, unspecified: Secondary | ICD-10-CM | POA: Insufficient documentation

## 2024-01-08 DIAGNOSIS — Z79891 Long term (current) use of opiate analgesic: Secondary | ICD-10-CM | POA: Diagnosis present

## 2024-01-08 DIAGNOSIS — M25561 Pain in right knee: Secondary | ICD-10-CM | POA: Insufficient documentation

## 2024-01-08 DIAGNOSIS — M65342 Trigger finger, left ring finger: Secondary | ICD-10-CM | POA: Diagnosis present

## 2024-01-08 DIAGNOSIS — M79605 Pain in left leg: Secondary | ICD-10-CM | POA: Insufficient documentation

## 2024-01-08 DIAGNOSIS — G96198 Other disorders of meninges, not elsewhere classified: Secondary | ICD-10-CM | POA: Insufficient documentation

## 2024-01-08 DIAGNOSIS — Z79899 Other long term (current) drug therapy: Secondary | ICD-10-CM | POA: Insufficient documentation

## 2024-01-08 DIAGNOSIS — M79604 Pain in right leg: Secondary | ICD-10-CM | POA: Insufficient documentation

## 2024-01-08 DIAGNOSIS — M47812 Spondylosis without myelopathy or radiculopathy, cervical region: Secondary | ICD-10-CM | POA: Diagnosis present

## 2024-01-08 DIAGNOSIS — M5412 Radiculopathy, cervical region: Secondary | ICD-10-CM | POA: Diagnosis present

## 2024-01-08 DIAGNOSIS — M542 Cervicalgia: Secondary | ICD-10-CM | POA: Diagnosis present

## 2024-01-08 DIAGNOSIS — M4722 Other spondylosis with radiculopathy, cervical region: Secondary | ICD-10-CM | POA: Diagnosis not present

## 2024-01-08 MED ORDER — OXYCODONE HCL 20 MG PO TABS
20.0000 mg | ORAL_TABLET | Freq: Three times a day (TID) | ORAL | 0 refills | Status: DC | PRN
Start: 2024-02-12 — End: 2024-04-02

## 2024-01-08 MED ORDER — OXYCODONE HCL 20 MG PO TABS
20.0000 mg | ORAL_TABLET | Freq: Three times a day (TID) | ORAL | 0 refills | Status: DC | PRN
Start: 2024-03-13 — End: 2024-04-02

## 2024-01-08 MED ORDER — OXYCODONE HCL 20 MG PO TABS
20.0000 mg | ORAL_TABLET | Freq: Three times a day (TID) | ORAL | 0 refills | Status: DC | PRN
Start: 2024-01-13 — End: 2024-04-02

## 2024-01-08 NOTE — Progress Notes (Signed)
 Nursing Pain Medication Assessment:  Safety precautions to be maintained throughout the outpatient stay will include: orient to surroundings, keep bed in low position, maintain call bell within reach at all times, provide assistance with transfer out of bed and ambulation.  Medication Inspection Compliance: Pill count conducted under aseptic conditions, in front of the patient. Neither the pills nor the bottle was removed from the patient's sight at any time. Once count was completed pills were immediately returned to the patient in their original bottle.  Medication: Oxycodone IR Pill/Patch Count:  20 of 90 pills remain Pill/Patch Appearance: Markings consistent with prescribed medication Bottle Appearance: Standard pharmacy container. Clearly labeled. Filled Date: 2 / 10 / 2025 Last Medication intake:  TodaySafety precautions to be maintained throughout the outpatient stay will include: orient to surroundings, keep bed in low position, maintain call bell within reach at all times, provide assistance with transfer out of bed and ambulation.

## 2024-04-02 ENCOUNTER — Telehealth: Payer: Self-pay | Admitting: Pain Medicine

## 2024-04-02 NOTE — Telephone Encounter (Signed)
 Patient states he will be in town this weekend so he can attend his 04-06-24 appt. Does Dr Barth Borne still want him to do the xrays ? If so he will need a new order the one from March has expired. Please advise patient

## 2024-04-06 ENCOUNTER — Ambulatory Visit
Admission: RE | Admit: 2024-04-06 | Discharge: 2024-04-06 | Disposition: A | Discharge status: Home / Self Care | Attending: Pain Medicine | Admitting: Pain Medicine

## 2024-04-06 ENCOUNTER — Encounter: Admitting: Pain Medicine

## 2024-04-06 ENCOUNTER — Encounter: Payer: Self-pay | Admitting: Nurse Practitioner

## 2024-04-06 ENCOUNTER — Ambulatory Visit
Admission: RE | Admit: 2024-04-06 | Discharge: 2024-04-06 | Disposition: A | Discharge status: Home / Self Care | Source: Ambulatory Visit | Attending: Pain Medicine | Admitting: Pain Medicine

## 2024-04-06 ENCOUNTER — Ambulatory Visit: Admitting: Nurse Practitioner

## 2024-04-06 DIAGNOSIS — M961 Postlaminectomy syndrome, not elsewhere classified: Secondary | ICD-10-CM | POA: Insufficient documentation

## 2024-04-06 DIAGNOSIS — G8929 Other chronic pain: Secondary | ICD-10-CM | POA: Diagnosis present

## 2024-04-06 DIAGNOSIS — G894 Chronic pain syndrome: Secondary | ICD-10-CM | POA: Diagnosis present

## 2024-04-06 DIAGNOSIS — M25561 Pain in right knee: Secondary | ICD-10-CM | POA: Diagnosis present

## 2024-04-06 DIAGNOSIS — M25562 Pain in left knee: Secondary | ICD-10-CM

## 2024-04-06 DIAGNOSIS — M5412 Radiculopathy, cervical region: Secondary | ICD-10-CM | POA: Diagnosis present

## 2024-04-06 DIAGNOSIS — M79604 Pain in right leg: Secondary | ICD-10-CM | POA: Diagnosis not present

## 2024-04-06 DIAGNOSIS — Z79899 Other long term (current) drug therapy: Secondary | ICD-10-CM

## 2024-04-06 DIAGNOSIS — M47812 Spondylosis without myelopathy or radiculopathy, cervical region: Secondary | ICD-10-CM | POA: Diagnosis present

## 2024-04-06 DIAGNOSIS — M79605 Pain in left leg: Secondary | ICD-10-CM | POA: Insufficient documentation

## 2024-04-06 DIAGNOSIS — M542 Cervicalgia: Secondary | ICD-10-CM | POA: Insufficient documentation

## 2024-04-06 DIAGNOSIS — G96198 Other disorders of meninges, not elsewhere classified: Secondary | ICD-10-CM

## 2024-04-06 DIAGNOSIS — M545 Low back pain, unspecified: Secondary | ICD-10-CM | POA: Diagnosis not present

## 2024-04-06 DIAGNOSIS — Z79891 Long term (current) use of opiate analgesic: Secondary | ICD-10-CM

## 2024-04-06 MED ORDER — OXYCODONE HCL 20 MG PO TABS: 20.0000 mg | ORAL_TABLET | Freq: Three times a day (TID) | ORAL | 0 refills | Status: DC | PRN

## 2024-04-06 MED ORDER — OXYCODONE HCL 20 MG PO TABS
20.0000 mg | ORAL_TABLET | Freq: Three times a day (TID) | ORAL | 0 refills | Status: DC | PRN
Start: 2024-04-13 — End: 2024-07-07

## 2024-04-06 NOTE — Progress Notes (Signed)
 PROVIDER NOTE: Interpretation of information contained herein should be left to medically-trained personnel. Specific patient instructions are provided elsewhere under "Patient Instructions" section of medical record. This document was created in part using AI and STT-dictation technology, any transcriptional errors that may result from this process are unintentional.  Patient: James [name]  Service: E/M   PCP: Anda Katayama, MD  DOB: 06-26-1956  DOS: 04/06/2024  Provider: Cherylin Corrigan, NP  MRN: [id number]  Delivery: Face-to-face  Specialty: Interventional Pain Management  Type: Established Patient  Setting: Ambulatory outpatient facility  Specialty designation: 09  Referring Prov.: Anda Katayama, MD  Location: Outpatient office facility       History of present illness (HPI) [name], a 68 y.o. year old male, is here today because of his No primary diagnosis found.. Mr. Brase's primary complain today is Back Pain   Pain Assessment: Severity of Chronic pain is reported as a 8 /10. Location: Back Upper, Mid, Lower/both arms and both legs. Onset: More than a month ago. Quality: Stabbing, Throbbing. Timing: Constant. Modifying factor(s): rest medications. Vitals:  height is 6' (1.829 m) and weight is 270 lb (122.5 kg). His temporal temperature is 98.1 F (36.7 C). His blood pressure is 147/80 (abnormal) and his pulse is 73. His respiration is 18 and oxygen saturation is 96%.  BMI: Estimated body mass index is 36.62 kg/m as calculated from the following:   Height as of this encounter: 6' (1.829 m).   Weight as of this encounter: 270 lb (122.5 kg).  Last encounter: 01/08/2024 Last procedure: Visit date not found.  Reason for encounter: medication management. The patient indicates doing well with current medication regimen. No side effects or adverse reaction reported to medication. He complains lower back pain; however pain medication regimen provides functional improvement and pain  relief.   Pharmacotherapy Assessment   Analgesic: Oxycodone  HCl 20 mg tablet by mouth every 8 hours as needed for pain. MME=90 Monitoring: [place] PMP: PDMP reviewed during this encounter.       Pharmacotherapy: No side-effects or adverse reactions reported. Compliance: No problems identified. Effectiveness: Clinically acceptable.  Calton Catholic, RN  04/06/2024 12:50 PM  Sign when Signing Visit Nursing Pain Medication Assessment:  Safety precautions to be maintained throughout the outpatient stay will include: orient to surroundings, keep bed in low position, maintain call bell within reach at all times, provide assistance with transfer out of bed and ambulation.  Medication Inspection Compliance: Pill count conducted under aseptic conditions, in front of the patient. Neither the pills nor the bottle was removed from the patient's sight at any time. Once count was completed pills were immediately returned to the patient in their original bottle.  Medication: Oxycodone  IR Pill/Patch Count: 19 of 90 pills/patches remain Pill/Patch Appearance: Markings consistent with prescribed medication Bottle Appearance: Standard pharmacy container. Clearly labeled. Filled Date: 05 / 10 / 2025 Last Medication intake:  TodaySafety precautions to be maintained throughout the outpatient stay will include: orient to surroundings, keep bed in low position, maintain call bell within reach at all times, provide assistance with transfer out of bed and ambulation.   No results found for: "CBDTHCR" No results found for: "D8THCCBX" No results found for: "D9THCCBX"  UDS:  Summary  Date Value Ref Range Status  03/13/2023 Note  Final    Comment:    ==================================================================== [employer] Select 13 (MW) ==================================================================== Test  Result       Flag       Units  Drug Present and Declared for Prescription  Verification   Oxycodone                       1860         EXPECTED   ng/mg creat   Oxymorphone                    2416         EXPECTED   ng/mg creat   Noroxycodone                   5174         EXPECTED   ng/mg creat   Noroxymorphone                 828          EXPECTED   ng/mg creat    Sources of oxycodone  are scheduled prescription medications.    Oxymorphone, noroxycodone, and noroxymorphone are expected    metabolites of oxycodone . Oxymorphone is also available as a    scheduled prescription medication.  ==================================================================== Test                      Result    Flag   Units      Ref Range   Creatinine              134              mg/dL      >=40 ==================================================================== Declared Medications:  The flagging and interpretation on this report are based on the  following declared medications.  Unexpected results may arise from  inaccuracies in the declared medications.   **Note: The testing scope of this panel includes these medications:   Oxycodone    **Note: The testing scope of this panel does not include the  following reported medications:   Albuterol  (Ventolin  HFA)  Cholecalciferol  Levothyroxine (Synthroid)  Loratadine (Claritin)  Metoprolol (Toprol)  Multivitamin  Naloxone  (Narcan )  Tizanidine  (Zanaflex )  Vitamin B  Vitamin D2 (Ergocalciferol ) ==================================================================== For clinical consultation, please call [phone]. ====================================================================      ROS  Constitutional: Denies any fever or chills Gastrointestinal: No reported hemesis, hematochezia, vomiting, or acute GI distress Musculoskeletal: lower back pain  Neurological: No reported episodes of acute onset apraxia, aphasia, dysarthria, agnosia, amnesia, paralysis, loss of coordination, or loss of consciousness  Medication  Review  Cholecalciferol, Ergocalciferol , Oxycodone  HCl, albuterol , b complex vitamins, levothyroxine, loratadine, metoprolol succinate, multivitamin, naloxone , temazepam, and tiZANidine   History Review  Allergy: Mr. Boozer is allergic to gabapentin, penicillins, lactose, nsaids, talwin [pentazocine], codeine, procaine, and sulfa antibiotics. Drug: Mr. Venard  reports no history of drug use. Alcohol:  reports no history of alcohol use. Tobacco:  reports that he has quit smoking. His smoking use included cigarettes. His smokeless tobacco use includes chew. Social: Mr. Harland  reports that he has quit smoking. His smoking use included cigarettes. His smokeless tobacco use includes chew. He reports that he does not drink alcohol and does not use drugs. 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 Surgical: Mr. Dorner  has a past surgical history that includes Spine surgery; Eye surgery; Fracture surgery (Right); Fracture surgery (Bilateral); Fracture surgery (Bilateral); Joint replacement (Right); Spinal cord stimulator implant; Spinal cord stimulator removal; Patella arthroplasty; Shoulder arthroscopy (Bilateral); Esophagogastroduodenoscopy (egd) with propofol  (N/A, 01/20/2016); and Colonoscopy (2009). Family: family history includes Arthritis in his father and mother; COPD in his father; Cancer in his father and mother; Diabetes in his mother; Hematuria in his father and mother;  Hypertension in his father and mother; Kidney disease in his mother; Kidney failure in his mother; Prostate cancer in his father; Stroke in his mother.  Laboratory Chemistry Profile   Renal Lab Results  Component Value Date   BUN 15 08/19/2019   CREATININE 1.47 (H) 08/19/2019   GFRAA 59 (L) 08/19/2019   GFRNONAA 51 (L) 08/19/2019    Hepatic Lab Results  Component Value Date   AST 25 08/19/2019   ALT 24 08/19/2019   ALBUMIN 4.2 08/19/2019   ALKPHOS 47 08/19/2019    Electrolytes Lab Results  Component Value Date   NA 138 08/19/2019   K 4.1 08/19/2019   CL 100 08/19/2019   CALCIUM 9.3 08/19/2019   MG 2.1 08/19/2019    Bone Lab Results  Component Value Date   VD25OH 26.19 (L) 08/19/2019   25OHVITD1 42 12/06/2016   25OHVITD2 <1.0 12/06/2016   25OHVITD3 42 12/06/2016   TESTOSTERONE  285 02/12/2018    Inflammation (CRP: Acute Phase) (ESR: Chronic Phase) Lab Results  Component Value Date   CRP <0.8 08/19/2019   ESRSEDRATE 1 08/19/2019         Note: Above Lab results reviewed.  Recent Imaging Review  MR CERVICAL SPINE WO CONTRAST CLINICAL DATA:  Neck pain worse on the right with radiculopathy. Numbness in the arms and hands with worsening symptoms in the past 6 months.  EXAM: MRI CERVICAL SPINE WITHOUT CONTRAST  TECHNIQUE: Multiplanar, multisequence MR imaging of the cervical spine was performed. No intravenous contrast was administered.  COMPARISON:  12/13/2016  FINDINGS: Alignment: Chronic cervical spine straightening. Unchanged minimal anterolisthesis of C3 on C4.  Vertebrae: No fracture or suspicious osseous lesion. Chronic degenerative endplate changes at C6-7 with decreased degenerative endplate edema.  Cord: Normal signal.  Posterior Fossa, vertebral arteries, paraspinal tissues: Unremarkable.  Disc levels:  C2-3: Mild right and moderate to severe left facet arthrosis without stenosis, unchanged.  C3-4: Central disc protrusion, uncovertebral  spurring, and severe right and moderate left facet arthrosis result in borderline spinal stenosis with slight ventral cord flattening and moderate right neural foraminal stenosis, unchanged.  C4-5: Minimal disc bulging, mild uncovertebral spurring, and moderate right facet arthrosis without stenosis, unchanged.  C5-6: Moderate disc space narrowing. Disc bulging, uncovertebral spurring, and moderate right and mild left facet arthrosis result in mild spinal stenosis and moderate right greater than left neural foraminal stenosis, unchanged.  C6-7: Severe disc space narrowing. Broad-based posterior disc osteophyte complex results in mild spinal stenosis and mild-to-moderate right and borderline left neural foraminal stenosis, unchanged.  C7-T1: Minimal disc bulging and moderate to severe right and mild left facet arthrosis result in mild right neural foraminal stenosis without spinal stenosis, unchanged.  IMPRESSION: 1. Decreased degenerative endplate edema at N8-2. 2. Otherwise unchanged appearance of the cervical spine with up to mild spinal and moderate neural foraminal stenosis at multiple levels as above.  Electronically Signed   By: Aundra Lee M.D.   On: 09/19/2018 10:30  Note: Reviewed         Physical Exam  General appearance: Well nourished, well developed, and well hydrated. In no apparent acute distress Mental status: Alert, oriented x 3 (person, place, & time)       Respiratory: No evidence of acute respiratory distress Eyes: PERLA Vitals: BP (!) 147/80 (Cuff Size: Normal)   Pulse 73   Temp 98.1 F (36.7 C) (Temporal)   Resp 18   Ht 6' (1.829 m)   Wt 270 lb (122.5 kg)   SpO2 96%   BMI 36.62 kg/m  BMI: Estimated body mass index is 36.62 kg/m as calculated from the following:   Height as of this encounter: 6' (1.829 m).   Weight as of this encounter: 270 lb (122.5 kg). Ideal: Ideal body weight: 77.6 kg (171 lb 1.2 oz) Adjusted ideal body weight: 95.5 kg  (210 lb 10.3 oz)  Assessment   Diagnosis Status  1. Chronic low back pain (1ry area of Pain) (Bilateral) (R>L)   2. Chronic lower extremity pain (2ry area of Pain) (Bilateral) (L>R)   3. Cervicalgia (Right)   4. Cervical spondylosis (C7 intravertebral body cyst)   5. Chronic knee pain (Bilateral) (R>L)   6. Failed back surgical syndrome (2001 by Dr. Shaune Delaine)   7. Chronic cervical radicular pain (Right)   8. Chronic pain syndrome   9. Pharmacologic therapy   10. Chronic use of opiate for therapeutic purpose   11. Encounter for medication management   12. Encounter for chronic pain management    Controlled Controlled Controlled   Updated Problems: No problems updated.  Plan of Care  Problem-specific:  Assessment and Plan We will continue on current medication regimen. Prescribing drug monitoring (PDMP) reviewed; findings consistent with the use of prescribed medication and no evidence of narcotic misuse or abuse. Routine UDS ordered today. No new issues or problems reported to this visit. Schedule follow up in 90 days.    Mr. RAYSHUN KANDLER has a current medication list which includes the following long-term medication(s): albuterol , naloxone , [START ON 04/13/2024] oxycodone  hcl, [START ON 05/13/2024] oxycodone  hcl, [START ON 06/12/2024] oxycodone  hcl, and tizanidine .  Pharmacotherapy (Medications Ordered): Meds ordered this encounter  Medications   Oxycodone  HCl 20 MG TABS    Sig: Take 1 tablet (20 mg total) by mouth every 8 (eight) hours as needed. Must last 30 days    Dispense:  90 tablet    Refill:  0    DO NOT: delete (not duplicate); no partial-fill (will deny script to complete), no refill request (F/U required). DISPENSE: 1 day early if closed on fill date. WARN: No CNS-depressants within 8 hrs of med.   Oxycodone  HCl 20 MG TABS    Sig: Take 1 tablet (20 mg total) by mouth every 8 (eight) hours as needed. Must last 30 days    Dispense:  90 tablet    Refill:  0     DO NOT: delete (not duplicate); no partial-fill (will deny script to complete), no refill request (F/U required). DISPENSE: 1 day early if closed on fill date. WARN: No CNS-depressants within 8 hrs of med.   Oxycodone  HCl 20 MG TABS    Sig: Take 1 tablet (20 mg total) by mouth every 8 (eight) hours as needed. Must last 30 days    Dispense:  90 tablet    Refill:  0    DO NOT: delete (not duplicate); no partial-fill (will deny script to complete), no refill request (F/U required).  DISPENSE: 1 day early if closed on fill date. WARN: No CNS-depressants within 8 hrs of med.   Orders:  Orders Placed This Encounter  Procedures   Tox[employer]SU[employer] Select 13 (MW), Urine    Volume: 30 ml(s). Minimum 3 ml of urine is needed. Document temperature of fresh sample. Indications: Long term (current) use of opiate analgesic (U98.119)    Release to patient:   Immediate        Return in about 3 months (around 07/07/2024) for (F2F), (MM), Marthe Slain NP.    Recent Visits Date Type Provider Dept  01/08/24 Office Visit Renaldo Caroli, MD [facility]  Showing recent visits within past 90 days and meeting all other requirements Today's Visits Date Type Provider Dept  04/06/24 Office Visit Quenna Doepke K, NP [facility]  Showing today's visits and meeting all other requirements Future Appointments No visits were found meeting these conditions. Showing future appointments within next 90 days and meeting all other requirements  I discussed the assessment and treatment plan with the patient. The patient was provided an opportunity to ask questions and all were answered. The patient agreed with the plan and demonstrated an understanding of the instructions.  Patient advised to call back or seek an in-person evaluation if the symptoms or condition worsens.  Duration of encounter: 30 minutes.  Total time on encounter, as per AMA guidelines included both the face-to-face and non-face-to-face  time personally spent by the physician and/or other qualified health care professional(s) on the day of the encounter (includes time in activities that require the physician or other qualified health care professional and does not include time in activities normally performed by clinical staff). Physician's time may include the following activities when performed: Preparing to see the patient (e.g., pre-charting review of records, searching for previously ordered imaging, lab work, and nerve conduction tests) Review of prior analgesic pharmacotherapies. Reviewing PMP Interpreting ordered tests (e.g., lab work, imaging, nerve conduction tests) Performing post-procedure evaluations, including interpretation of diagnostic procedures Obtaining and/or reviewing separately obtained history Performing a medically appropriate examination and/or evaluation Counseling and educating the patient/family/caregiver Ordering medications, tests, or procedures Referring and communicating with other health care professionals (when not separately reported) Documenting clinical information in the electronic or other health record Independently interpreting results (not separately reported) and communicating results to the patient/ family/caregiver Care coordination (not separately reported)  Note by: Nikelle Malatesta K Barkley Kratochvil, NP (TTS and AI technology used. I apologize for any typographical errors that were not detected and corrected.) Date: 04/06/2024; Time: 1:35 PM

## 2024-04-06 NOTE — Progress Notes (Signed)
 Nursing Pain Medication Assessment:  Safety precautions to be maintained throughout the outpatient stay will include: orient to surroundings, keep bed in low position, maintain call bell within reach at all times, provide assistance with transfer out of bed and ambulation.  Medication Inspection Compliance: Pill count conducted under aseptic conditions, in front of the patient. Neither the pills nor the bottle was removed from the patient's sight at any time. Once count was completed pills were immediately returned to the patient in their original bottle.  Medication: Oxycodone  IR Pill/Patch Count: 19 of 90 pills/patches remain Pill/Patch Appearance: Markings consistent with prescribed medication Bottle Appearance: Standard pharmacy container. Clearly labeled. Filled Date: 05 / 10 / 2025 Last Medication intake:  TodaySafety precautions to be maintained throughout the outpatient stay will include: orient to surroundings, keep bed in low position, maintain call bell within reach at all times, provide assistance with transfer out of bed and ambulation.

## 2024-04-08 LAB — TOXASSURE SELECT 13 (MW), URINE

## 2024-04-13 ENCOUNTER — Encounter: Payer: Self-pay | Admitting: Pain Medicine

## 2024-04-14 ENCOUNTER — Encounter: Payer: Self-pay | Admitting: Pain Medicine

## 2024-04-14 ENCOUNTER — Other Ambulatory Visit (HOSPITAL_BASED_OUTPATIENT_CLINIC_OR_DEPARTMENT_OTHER): Payer: Self-pay | Admitting: Pain Medicine

## 2024-04-14 DIAGNOSIS — S32020A Wedge compression fracture of second lumbar vertebra, initial encounter for closed fracture: Secondary | ICD-10-CM

## 2024-04-14 DIAGNOSIS — M545 Low back pain, unspecified: Secondary | ICD-10-CM

## 2024-04-14 DIAGNOSIS — S32029A Unspecified fracture of second lumbar vertebra, initial encounter for closed fracture: Secondary | ICD-10-CM | POA: Insufficient documentation

## 2024-04-14 DIAGNOSIS — G8929 Other chronic pain: Secondary | ICD-10-CM

## 2024-04-14 NOTE — Progress Notes (Signed)
 The patient has a recent x-ray of the lumbar spine with bending views demonstrating an L2 vertebral body compression fracture with 20% loss of height.  He does not have any other more recent x-rays or MRIs under the [facility] system documenting that fracture.  The last imaging study that this patient had in our system was from 09/19/2018 and it consisted of a cervical MRI.  Looking into "care everywhere", there is a CT of the chest area done on 06/26/2023 where there is no mention of a fracture.  Other than that there is an MRI of the lumbar spine that was also done at a [facility] facility on 12/27/2021 with no mention of such fracture indicating that the fracture must have occurred sometime between that 12/27/2021 Lumbar MRI and our 04/06/2024 diagnostic x-rays of the lumbar spine.  Although I can see the reports from studies done elsewhere, I can only see the images from those done within our healthcare system ([facility]) and therefore I cannot go to the CT of the chest to see if any chance there is a hint of that fracture.  To determine if this fracture is active we will need to order an MRI of the lumbar spine.

## 2024-04-20 ENCOUNTER — Ambulatory Visit
Admission: RE | Admit: 2024-04-20 | Discharge: 2024-04-20 | Disposition: A | Discharge status: Home / Self Care | Source: Ambulatory Visit | Attending: Pain Medicine | Admitting: Pain Medicine

## 2024-04-20 DIAGNOSIS — M545 Low back pain, unspecified: Secondary | ICD-10-CM | POA: Diagnosis present

## 2024-04-20 DIAGNOSIS — S32020A Wedge compression fracture of second lumbar vertebra, initial encounter for closed fracture: Secondary | ICD-10-CM | POA: Insufficient documentation

## 2024-04-20 DIAGNOSIS — G8929 Other chronic pain: Secondary | ICD-10-CM | POA: Diagnosis present

## 2024-05-05 DIAGNOSIS — M818 Other osteoporosis without current pathological fracture: Secondary | ICD-10-CM | POA: Insufficient documentation

## 2024-05-17 DIAGNOSIS — Z7289 Other problems related to lifestyle: Secondary | ICD-10-CM | POA: Insufficient documentation

## 2024-05-17 DIAGNOSIS — D869 Sarcoidosis, unspecified: Secondary | ICD-10-CM | POA: Insufficient documentation

## 2024-05-17 DIAGNOSIS — Z1159 Encounter for screening for other viral diseases: Secondary | ICD-10-CM | POA: Insufficient documentation

## 2024-05-17 DIAGNOSIS — M4690 Unspecified inflammatory spondylopathy, site unspecified: Secondary | ICD-10-CM | POA: Insufficient documentation

## 2024-05-17 DIAGNOSIS — Z9289 Personal history of other medical treatment: Secondary | ICD-10-CM | POA: Insufficient documentation

## 2024-06-12 ENCOUNTER — Other Ambulatory Visit: Payer: Self-pay | Admitting: Medical Genetics

## 2024-07-03 ENCOUNTER — Telehealth: Payer: Self-pay | Admitting: Nurse Practitioner

## 2024-07-03 NOTE — Telephone Encounter (Signed)
 PT called stated that he fall , wants to see if he can have a one time vv, mm appointment. PT is schedule for Tues

## 2024-07-07 ENCOUNTER — Encounter: Payer: Self-pay | Admitting: Nurse Practitioner

## 2024-07-07 ENCOUNTER — Ambulatory Visit: Attending: Nurse Practitioner | Admitting: Nurse Practitioner

## 2024-07-07 DIAGNOSIS — M961 Postlaminectomy syndrome, not elsewhere classified: Secondary | ICD-10-CM | POA: Insufficient documentation

## 2024-07-07 DIAGNOSIS — M545 Low back pain, unspecified: Secondary | ICD-10-CM | POA: Insufficient documentation

## 2024-07-07 DIAGNOSIS — Z79899 Other long term (current) drug therapy: Secondary | ICD-10-CM | POA: Diagnosis present

## 2024-07-07 DIAGNOSIS — M25562 Pain in left knee: Secondary | ICD-10-CM | POA: Insufficient documentation

## 2024-07-07 DIAGNOSIS — M5412 Radiculopathy, cervical region: Secondary | ICD-10-CM | POA: Insufficient documentation

## 2024-07-07 DIAGNOSIS — M542 Cervicalgia: Secondary | ICD-10-CM | POA: Insufficient documentation

## 2024-07-07 DIAGNOSIS — G8929 Other chronic pain: Secondary | ICD-10-CM | POA: Diagnosis present

## 2024-07-07 DIAGNOSIS — Z79891 Long term (current) use of opiate analgesic: Secondary | ICD-10-CM | POA: Diagnosis present

## 2024-07-07 DIAGNOSIS — M47812 Spondylosis without myelopathy or radiculopathy, cervical region: Secondary | ICD-10-CM | POA: Diagnosis present

## 2024-07-07 DIAGNOSIS — G894 Chronic pain syndrome: Secondary | ICD-10-CM | POA: Diagnosis present

## 2024-07-07 DIAGNOSIS — M79605 Pain in left leg: Secondary | ICD-10-CM | POA: Diagnosis not present

## 2024-07-07 DIAGNOSIS — M25561 Pain in right knee: Secondary | ICD-10-CM | POA: Insufficient documentation

## 2024-07-07 DIAGNOSIS — M79604 Pain in right leg: Secondary | ICD-10-CM | POA: Diagnosis present

## 2024-07-07 MED ORDER — OXYCODONE HCL 20 MG PO TABS: 20.0000 mg | ORAL_TABLET | Freq: Three times a day (TID) | ORAL | 0 refills | Status: DC | PRN

## 2024-07-07 MED ORDER — NALOXONE HCL 4 MG/0.1ML NA LIQD: 1.0000 | NASAL | 0 refills | Status: AC | PRN

## 2024-07-07 NOTE — Progress Notes (Signed)
 Nursing Pain Medication Assessment:  Safety precautions to be maintained throughout the outpatient stay will include: orient to surroundings, keep bed in low position, maintain call bell within reach at all times, provide assistance with transfer out of bed and ambulation.  Medication Inspection Compliance: Pill count conducted under aseptic conditions, in front of the patient. Neither the pills nor the bottle was removed from the patient's sight at any time. Once count was completed pills were immediately returned to the patient in their original bottle.  Medication: Oxycodone  IR Pill/Patch Count: 17 of 90 pills/patches remain Pill/Patch Appearance: Markings consistent with prescribed medication Bottle Appearance: Standard pharmacy container. Clearly labeled. Filled Date: 08 / 09 / 2025 Last Medication intake:  Today

## 2024-07-07 NOTE — Progress Notes (Signed)
 PROVIDER NOTE: Interpretation of information contained herein should be left to medically-trained personnel. Specific patient instructions are provided elsewhere under Patient Instructions section of medical record. This document was created in part using AI and STT-dictation technology, any transcriptional errors that may result from this process are unintentional.  Patient: James [name]  Service: E/M   PCP: Filbert Coad, MD  DOB: Dec 13, 1955  DOS: 07/07/2024  Provider: Emmy MARLA Blanch, NP  MRN: [id number]  Delivery: Face-to-face  Specialty: Interventional Pain Management  Type: Established Patient  Setting: Ambulatory outpatient facility  Specialty designation: 09  Referring Prov.: Filbert Coad, MD  Location: Outpatient office facility       History of present illness (HPI) James [name], a 68 y.o. year old male, is here today because of his No primary diagnosis found.. James [name] primary complain today is Back Pain (Lower Back, radiating to bilateral hips, left worse than right, down legs to toes)  Pertinent problems: James [name] has Narrowing of intervertebral disc space; Chronic low back pain (1ry area of Pain) (Bilateral) (R>L); Lumbar spondylosis; Chronic neck pain (Right); Cervical spondylosis (C7 intravertebral body cyst); Chronic cervical radicular pain (Right); Chronic lumbar radicular pain (Bilateral) (L>R) (L5 Dermatome); Osteoarthritis; Chronic hip pain; Chronic knee pain (Bilateral) (R>L); Complex regional pain syndrome type II of upper extremity (Right); Chronic upper extremity pain (Right); 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   Pain Assessment: Severity of Chronic pain is reported as a 7 /10. Location: Back Lower/Bilateral hips, left worse than right.. Onset: More than a month ago. Quality: Aching, Burning, Throbbing, Tingling, Pins and needles, Stabbing.  Timing: Constant. Modifying factor(s): Medication, laying down. Vitals:  height is 5' 11 (1.803 m) and weight is 280 lb (127 kg). His temporal temperature is 97.2 F (36.2 C) (abnormal). His blood pressure is 127/67 and his pulse is 89. His respiration is 20.  BMI: Estimated body mass index is 39.05 kg/m as calculated from the following:   Height as of this encounter: 5' 11 (1.803 m).   Weight as of this encounter: 280 lb (127 kg).  Last encounter: 04/06/2024. Last procedure: Visit date not found.  Reason for encounter: medication management.  The patient indicates doing well with current medication regimen. No side effects or adverse reaction reported to medication. He complains lower back pain; however pain medication regimen provides functional improvement and pain relief.   Pharmacotherapy Assessment   Analgesic: Oxycodone  HCl 20 mg tablet by mouth every 8 hours as needed for pain. MME=90 Monitoring: [place] PMP: PDMP reviewed during this encounter.       Pharmacotherapy: No side-effects or adverse reactions reported. Compliance: No problems identified. Effectiveness: Clinically acceptable.  Erlene Doyal [place], [place]  07/07/2024  8:40 AM  Sign when Signing Visit Nursing Pain Medication Assessment:  Safety precautions to be maintained throughout the outpatient stay will include: orient to surroundings, keep bed in low position, maintain call bell within reach at all times, provide assistance with transfer out of bed and ambulation.  Medication Inspection Compliance: Pill count conducted under aseptic conditions, in front of the patient. Neither the pills nor the bottle was removed from the patient's sight at any time. Once count was completed pills were immediately returned to the patient in their original bottle.  Medication: Oxycodone  IR Pill/Patch Count: 17 of 90 pills/patches remain Pill/Patch Appearance: Markings consistent with prescribed medication Bottle Appearance: Standard pharmacy  container. Clearly labeled. Filled Date:  08 / 09 / 2025 Last Medication intake:  Today    UDS:  Summary  Date Value Ref Range Status  04/06/2024 FINAL  Final    Comment:    ==================================================================== [employer] [facility] 13 (MW) ==================================================================== Test                             Result       Flag       Units  Drug Present and Declared for Prescription Verification   Oxycodone                       2215         EXPECTED   ng/mg creat   Oxymorphone                    2240         EXPECTED   ng/mg creat   Noroxycodone                   5093         EXPECTED   ng/mg creat   Noroxymorphone                 968          EXPECTED   ng/mg creat    Sources of oxycodone  are scheduled prescription medications.    Oxymorphone, noroxycodone, and noroxymorphone are expected    metabolites of oxycodone . Oxymorphone is also available as a    scheduled prescription medication.  Drug Absent but Declared for Prescription Verification   Temazepam                      Not Detected UNEXPECTED ng/mg creat ==================================================================== Test                      Result    Flag   Units      Ref Range   Creatinine              104              mg/dL      >=79 ==================================================================== Declared Medications:  The flagging and interpretation on this report are based on the  following declared medications.  Unexpected results may arise from  inaccuracies in the declared medications.   **Note: The testing scope of this panel includes these medications:   Oxycodone   Temazepam (Restoril)   **Note: The testing scope of this panel does not include the  following reported medications:   Albuterol  (Ventolin  HFA)  Cholecalciferol  Levothyroxine (Synthroid)  Loratadine (Claritin)  Metoprolol (Toprol)  Multivitamin  Naloxone  (Narcan )   Tizanidine  (Zanaflex )  Vitamin B  Vitamin D2 (Ergocalciferol ) ==================================================================== For clinical consultation, please call [phone]. ====================================================================     No results found for: CBDTHCR No results found for: D8THCCBX No results found for: D9THCCBX  ROS  Constitutional: Denies any fever or chills Gastrointestinal: No reported hemesis, hematochezia, vomiting, or acute GI distress Musculoskeletal: Denies any acute onset joint swelling, redness, loss of ROM, or weakness Neurological: No reported episodes of acute onset apraxia, aphasia, dysarthria, agnosia, amnesia, paralysis, loss of coordination, or loss of consciousness  Medication Review  Cholecalciferol, Ergocalciferol , Oxycodone  HCl, albuterol , b complex vitamins, levothyroxine, loratadine, metoprolol succinate, multivitamin, naloxone , temazepam, and tiZANidine   History Review  Allergy: James [name] is allergic to gabapentin, penicillins, lactose, nsaids, talwin [pentazocine], codeine,  procaine, and sulfa antibiotics. Drug: James [name]  reports no history of drug use. Alcohol:  reports no history of alcohol use. Tobacco:  reports that he has quit smoking. His smoking use included cigarettes. His smokeless tobacco use includes chew. Social: Mr. Heyde  reports that he has quit smoking. His smoking use included cigarettes. His smokeless tobacco use includes chew. He reports that he does not drink alcohol and does not use drugs. 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 Surgical: James [name]  has a past surgical history that includes Spine surgery; Eye surgery; Fracture surgery (Right); Fracture surgery (Bilateral); Fracture surgery (Bilateral); Joint replacement (Right); Spinal cord stimulator implant; Spinal cord stimulator removal; Patella arthroplasty; Shoulder arthroscopy (Bilateral); Esophagogastroduodenoscopy (egd) with propofol  (N/A, 01/20/2016); and Colonoscopy (2009). Family: family history includes Arthritis in his father and mother; COPD in his father; Cancer in his father and mother; Diabetes in his mother; Hematuria in his father and mother; Hypertension in his father and mother; Kidney disease in his mother; Kidney failure in his mother; Prostate cancer in his father; Stroke in his mother.  Laboratory Chemistry Profile   Renal Lab Results  Component Value Date   BUN 15 08/19/2019   CREATININE 1.47 (H) 08/19/2019   GFRAA 59 (L) 08/19/2019   GFRNONAA 51 (L) 08/19/2019    Hepatic Lab Results  Component Value Date   AST 25 08/19/2019   ALT 24 08/19/2019   ALBUMIN 4.2 08/19/2019   ALKPHOS 47 08/19/2019    Electrolytes Lab Results  Component Value Date   NA 138 08/19/2019   K 4.1 08/19/2019   CL 100 08/19/2019   CALCIUM 9.3 08/19/2019   MG 2.1 08/19/2019    Bone Lab Results  Component Value Date   VD25OH 26.19 (L) 08/19/2019   25OHVITD1 42 12/06/2016   25OHVITD2 <1.0 12/06/2016   25OHVITD3 42 12/06/2016   TESTOSTERONE  285 02/12/2018    Inflammation (CRP: Acute Phase) (ESR: Chronic Phase) Lab Results  Component Value Date   CRP <0.8 08/19/2019   ESRSEDRATE 1 08/19/2019         Note: Above Lab results reviewed.  Recent Imaging Review  MR LUMBAR SPINE WO CONTRAST CLINICAL DATA:  Low back pain, prior surgery, lumbar radiculopathy  EXAM: MRI LUMBAR SPINE  WITHOUT CONTRAST  TECHNIQUE: Multiplanar, multisequence MR imaging of the lumbar spine was performed. No intravenous contrast was administered.  COMPARISON:  MRI of the lumbar spine 04/07/2015  FINDINGS: Segmentation: Standard.  Alignment:  Physiologic lumbar alignment is maintained.  Vertebrae: Operative changes of posterior decompression at L5-S1. Anterior and posterior fusion at these levels. Degenerative endplate marrow changes at a few levels. Schmorl's nodes at a few levels. No compression fractures.  Conus medullaris and cauda equina: The conus medullaris terminates at the level of L1-L2. The distal spinal cord signal intensity is normal.  Paraspinal and other soft tissues: Renal cysts bilaterally. The visualized aorta is normal.  Disc levels:  L1-L2: Disc bulge. Mild bilateral facet arthropathy. Moderate left and mild right neuroforaminal stenosis. Moderate spinal canal stenosis.  L2-L3: Disc bulge. Moderate bilateral facet arthropathy. Moderate left neuroforaminal stenosis. Moderate spinal canal stenosis.  L3-L4:  Disc bulge. Moderate facet arthropathy. Moderate bilateral neuroforaminal stenosis. No spinal canal stenosis.  L4-L5: Disc bulge. Moderate bilateral facet arthropathy. Moderate bilateral neuroforaminal stenosis. No spinal canal stenosis.  L5-S1: Interbody fusion. Facets are obscured. Moderate right and mild left neuroforaminal stenosis. No spinal canal stenosis.  IMPRESSION: 1. Moderate spinal canal stenosis at L1-L2 and L2-L3 secondary to disc bulging and facet arthropathy. 2. Moderate neuroforaminal stenosis at multiple levels as above. 3. Posterior decompression and fusion at L5-S1.  Electronically Signed   By: Clem Savory M.D.   On: 05/04/2024 13:24 Note: Reviewed        Physical Exam  Vitals: BP 127/67 (BP Location: Right Arm, Patient Position: Sitting)   Pulse 89   Temp (!) 97.2 F (36.2 C) (Temporal)   Resp 20   Ht 5' 11 (1.803 m)    Wt 280 lb (127 kg)   BMI 39.05 kg/m  BMI: Estimated body mass index is 39.05 kg/m as calculated from the following:   Height as of this encounter: 5' 11 (1.803 m).   Weight as of this encounter: 280 lb (127 kg). Ideal: Ideal body weight: 75.3 kg (166 lb 0.1 oz) Adjusted ideal body weight: 96 kg (211 lb 9.7 oz) General appearance: Well nourished, well developed, and well hydrated. In no apparent acute distress Mental status: Alert, oriented x 3 (person, place, & time)       Respiratory: No evidence of acute respiratory distress Eyes: PERLA   Assessment   Diagnosis Status  1. Chronic low back pain (1ry area of Pain) (Bilateral) (R>L)   2. Chronic lower extremity pain (2ry area of Pain) (Bilateral) (L>R)   3. Cervicalgia (Right)   4. Cervical spondylosis (C7 intravertebral body cyst)   5. Chronic knee pain (Bilateral) (R>L)   6. Failed back surgical syndrome (2001 by Dr. Carlin Hawthorne)   7. Chronic cervical radicular pain (Right)   8. Chronic pain syndrome   9. Pharmacologic therapy   10. Chronic use of opiate for therapeutic purpose   11. Encounter for medication management   12. Encounter for chronic pain management    Controlled Controlled Controlled   Updated Problems: No problems updated.  Plan of Care  Problem-specific:  Assessment and Plan  We will continue on current medication regimen. Prescribing drug monitoring (PDMP) reviewed; findings consistent with the use of prescribed medication and no evidence of narcotic misuse or abuse. Urine drug screening (UDS) up to date. No new issues or problems reported to this visit. Schedule follow up in 90 days for medication management.    James [name] has a current medication list which includes the following long-term medication(s): albuterol , naloxone , [START ON 07/13/2024] oxycodone  hcl, [START ON 08/12/2024] oxycodone  hcl, [START ON 09/11/2024] oxycodone  hcl, and tizanidine .  Pharmacotherapy (Medications  Ordered): Meds ordered this encounter  Medications   Oxycodone  HCl 20 MG TABS    Sig: Take 1 tablet (20 mg total) by mouth every 8 (eight) hours as needed. Must last 30 days    Dispense:  90 tablet    Refill:  0    DO NOT: delete (not duplicate); no partial-fill (will deny script to complete), no refill request (F/U required). DISPENSE: 1 day early if closed on fill date. WARN: No CNS-depressants within 8 hrs of med.   Oxycodone  HCl 20 MG TABS    Sig: Take 1 tablet (20 mg total) by mouth every 8 (eight) hours as needed. Must last 30 days    Dispense:  90 tablet  Refill:  0    DO NOT: delete (not duplicate); no partial-fill (will deny script to complete), no refill request (F/U required). DISPENSE: 1 day early if closed on fill date. WARN: No CNS-depressants within 8 hrs of med.   Oxycodone  HCl 20 MG TABS    Sig: Take 1 tablet (20 mg total) by mouth every 8 (eight) hours as needed. Must last 30 days    Dispense:  90 tablet    Refill:  0    DO NOT: delete (not duplicate); no partial-fill (will deny script to complete), no refill request (F/U required). DISPENSE: 1 day early if closed on fill date. WARN: No CNS-depressants within 8 hrs of med.   naloxone  (NARCAN ) nasal spray 4 mg/0.1 mL    Sig: Place 1 spray into the nose as needed for up to 365 doses (for opioid-induced respiratory depresssion). In case of emergency (overdose), spray once into each nostril. If no response within 3 minutes, repeat application and call 911.    Dispense:  1 each    Refill:  0    Instruct patient in proper use of device.   Orders:  No orders of the defined types were placed in this encounter.       Return in about 3 months (around 10/06/2024) for (F2F), (MM), Emmy Blanch NP.    Recent Visits No visits were found meeting these conditions. Showing recent visits within past 90 days and meeting all other requirements Today's Visits Date Type Provider Dept  07/07/24 Office Visit Payten Hobin K, NP  [facility]  Showing today's visits and meeting all other requirements Future Appointments Date Type Provider Dept  09/28/24 Appointment Rishawn Walck K, NP [facility]  Showing future appointments within next 90 days and meeting all other requirements  I discussed the assessment and treatment plan with the patient. The patient was provided an opportunity to ask questions and all were answered. The patient agreed with the plan and demonstrated an understanding of the instructions.  Patient advised to call back or seek an in-person evaluation if the symptoms or condition worsens.  Duration of encounter: 30 minutes.  Total time on encounter, as per AMA guidelines included both the face-to-face and non-face-to-face time personally spent by the physician and/or other qualified health care professional(s) on the day of the encounter (includes time in activities that require the physician or other qualified health care professional and does not include time in activities normally performed by clinical staff). Physician's time may include the following activities when performed: Preparing to see the patient (e.g., pre-charting review of records, searching for previously ordered imaging, lab work, and nerve conduction tests) Review of prior analgesic pharmacotherapies. Reviewing PMP Interpreting ordered tests (e.g., lab work, imaging, nerve conduction tests) Performing post-procedure evaluations, including interpretation of diagnostic procedures Obtaining and/or reviewing separately obtained history Performing a medically appropriate examination and/or evaluation Counseling and educating the patient/family/caregiver Ordering medications, tests, or procedures Referring and communicating with other health care professionals (when not separately reported) Documenting clinical information in the electronic or other health record Independently interpreting results (not separately  reported) and communicating results to the patient/ family/caregiver Care coordination (not separately reported)  Note by: Verle Wheeling K Devani Odonnel, NP (TTS and AI technology used. I apologize for any typographical errors that were not detected and corrected.) Date: 07/07/2024; Time: 9:19 AM

## 2024-07-21 DIAGNOSIS — I471 Supraventricular tachycardia, unspecified: Secondary | ICD-10-CM | POA: Insufficient documentation

## 2024-08-11 ENCOUNTER — Telehealth: Payer: Self-pay | Admitting: Pain Medicine

## 2024-08-11 NOTE — Telephone Encounter (Signed)
 Spoke with patient, advised him that he may see Emmy Blanch NP this week, and she can order an injection if indicated. Patient agrees.

## 2024-08-11 NOTE — Telephone Encounter (Signed)
 Patient would like to get xray of the hip that is bothering him. Please see if Seema wants to go ahead and order this he will get it done today or tomorrow. Just need to let him know

## 2024-08-11 NOTE — Telephone Encounter (Signed)
 Patient states he is having pain in left hip or possible lower back. Is asking if he can get a shot or something to help with this pain. I explained Dr Tanya is out.

## 2024-08-11 NOTE — Telephone Encounter (Signed)
 Seema will not order without an evaluation first.

## 2024-08-11 NOTE — Telephone Encounter (Signed)
 Notified patient that he would need to wait for evaluation before any xrays could be ordered. Patient verbalized understanding.

## 2024-08-13 ENCOUNTER — Ambulatory Visit
Admission: RE | Admit: 2024-08-13 | Discharge: 2024-08-13 | Disposition: A | Source: Ambulatory Visit | Attending: Nurse Practitioner | Admitting: Nurse Practitioner

## 2024-08-13 ENCOUNTER — Encounter: Payer: Self-pay | Admitting: Nurse Practitioner

## 2024-08-13 ENCOUNTER — Ambulatory Visit: Admitting: Nurse Practitioner

## 2024-08-13 ENCOUNTER — Ambulatory Visit
Admission: RE | Admit: 2024-08-13 | Discharge: 2024-08-13 | Disposition: A | Discharge status: Home / Self Care | Source: Ambulatory Visit | Attending: Nurse Practitioner | Admitting: Nurse Practitioner

## 2024-08-13 VITALS — BP 140/101 | HR 78 | Temp 98.6°F | Resp 20 | Ht 71.0 in | Wt 270.0 lb

## 2024-08-13 DIAGNOSIS — G8929 Other chronic pain: Secondary | ICD-10-CM | POA: Insufficient documentation

## 2024-08-13 DIAGNOSIS — M25512 Pain in left shoulder: Secondary | ICD-10-CM | POA: Diagnosis not present

## 2024-08-13 DIAGNOSIS — M25552 Pain in left hip: Secondary | ICD-10-CM | POA: Diagnosis present

## 2024-08-13 DIAGNOSIS — M7918 Myalgia, other site: Secondary | ICD-10-CM | POA: Insufficient documentation

## 2024-08-13 MED ORDER — TIZANIDINE HCL 4 MG PO TABS: 4.0000 mg | ORAL_TABLET | Freq: Three times a day (TID) | ORAL | 2 refills | Status: AC

## 2024-08-13 NOTE — Progress Notes (Signed)
 Safety precautions to be maintained throughout the outpatient stay will include: orient to surroundings, keep bed in low position, maintain call bell within reach at all times, provide assistance with transfer out of bed and ambulation.

## 2024-08-13 NOTE — Progress Notes (Signed)
 PROVIDER NOTE: Interpretation of information contained herein should be left to medically-trained personnel. Specific patient instructions are provided elsewhere under Patient Instructions section of medical record. This document was created in part using AI and STT-dictation technology, any transcriptional errors that may result from this process are unintentional.  Patient: James [name]  Service: E/M   PCP: Filbert Coad, MD  DOB: 02-27-1956  DOS: 08/13/2024  Provider: Emmy MARLA Blanch, NP  MRN: [id number]  Delivery: Face-to-face  Specialty: Interventional Pain Management  Type: Established Patient  Setting: Ambulatory outpatient facility  Specialty designation: 09  Referring Prov.: Filbert Coad, MD  Location: Outpatient office facility       History of present illness (HPI) [name], a 68 y.o. year old male, is here today because of his Back Pain, Hip Pain, and Shoulder Pain.. [name] primary complain today is Back Pain (Hip pain, muscle spasms and cramps in feet and back of the legs and shoulders. ), Hip Pain, and Shoulder Pain  Pertinent problems: 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  Pain Assessment: Severity of Chronic pain is reported as a 7 /10. Location: Back Lower/Bilateral Hips, left is worse than the right today.. Onset: More than a month ago. Quality: Aching, Burning,  Pins and needles, Stabbing, Throbbing. Timing: Constant. Modifying factor(s): Medication, Rest, Ice, Heat. Vitals:  height is 5' 11 (1.803 m) and weight is 270 lb (122.5 kg). His temporal temperature is 98.6 F (37 C). His blood pressure is 140/101 (abnormal) and his pulse is 78. His respiration is 20 and oxygen saturation is 98%.  BMI: Estimated body mass index is 37.66 kg/m as calculated from the following:   Height as of this encounter: 5' 11 (1.803 m).   Weight as of this encounter: 270 lb (122.5 kg).  Last encounter: 07/07/2024. Last procedure: Visit date not found.  Reason for encounter: follow-up evaluation.  The patient reports a fall 2 weeks ago after his foot gave out.  He did not seek any medical attention or treatment at that time.  He fell on his left side, primarily impacting his shoulder.  Since the fall, he continues to experience persistent pain in the left shoulder and left knee.  We discussed obtaining imaging studies for further evaluation. Pharmacotherapy Assessment   Tizanidine  4 mg 3 times daily for muscle spasm. Monitoring: [place] PMP: PDMP not reviewed this encounter.       Pharmacotherapy: No side-effects or adverse reactions reported. Compliance: No problems identified. Effectiveness: Clinically acceptable.  Erlene Doyal [place]UN[place], [place]  08/13/2024  8:56 AM  Sign when Signing Visit Safety precautions to be maintained throughout the outpatient stay will include: orient to surroundings, keep bed in low position, maintain call bell within reach at all times, provide assistance with transfer out of bed and ambulation.     UDS:  Summary  Date Value Ref Range Status  04/06/2024 FINAL  Final    Comment:    ==================================================================== [employer] [facility] 13 (MW) ==================================================================== Test  Result       Flag       Units  Drug Present and Declared for Prescription  Verification   Oxycodone                       2215         EXPECTED   ng/mg creat   Oxymorphone                    2240         EXPECTED   ng/mg creat   Noroxycodone                   5093         EXPECTED   ng/mg creat   Noroxymorphone                 968          EXPECTED   ng/mg creat    Sources of oxycodone  are scheduled prescription medications.    Oxymorphone, noroxycodone, and noroxymorphone are expected    metabolites of oxycodone . Oxymorphone is also available as a    scheduled prescription medication.  Drug Absent but Declared for Prescription Verification   Temazepam                      Not Detected UNEXPECTED ng/mg creat ==================================================================== Test                      Result    Flag   Units      Ref Range   Creatinine              104              mg/dL      >=79 ==================================================================== Declared Medications:  The flagging and interpretation on this report are based on the  following declared medications.  Unexpected results may arise from  inaccuracies in the declared medications.   **Note: The testing scope of this panel includes these medications:   Oxycodone   Temazepam (Restoril)   **Note: The testing scope of this panel does not include the  following reported medications:   Albuterol  (Ventolin  HFA)  Cholecalciferol  Levothyroxine (Synthroid)  Loratadine (Claritin)  Metoprolol (Toprol)  Multivitamin  Naloxone  (Narcan )  Tizanidine  (Zanaflex )  Vitamin B  Vitamin D2 (Ergocalciferol ) ==================================================================== For clinical consultation, please call [phone]. ====================================================================     No results found for: CBDTHCR No results found for: D8THCCBX No results found for: D9THCCBX  ROS  Constitutional: Denies any fever or chills Gastrointestinal: No reported hemesis,  hematochezia, vomiting, or acute GI distress Musculoskeletal: Left hip pain, left shoulder pain Neurological: No reported episodes of acute onset apraxia, aphasia, dysarthria, agnosia, amnesia, paralysis, loss of coordination, or loss of consciousness  Medication Review  Cholecalciferol, Ergocalciferol , Fluticasone-Umeclidin-Vilant, Oxycodone  HCl, albuterol , amLODipine, b complex vitamins, levothyroxine, loratadine, methotrexate, metoprolol succinate, multivitamin, naloxone , nitroGLYCERIN, predniSONE, temazepam, and tiZANidine   History Review  Allergy: Mr. Roye is allergic to gabapentin, penicillins, lactose, nsaids, talwin [pentazocine], codeine, procaine, and sulfa antibiotics. Drug: Mr. Geisel  reports no history of drug use. Alcohol:  reports no history of alcohol use. Tobacco:  reports that he has quit smoking. His smoking use included cigarettes. His smokeless tobacco use includes chew. Social: Mr. Paulino  reports that he has quit smoking. His smoking use included cigarettes. His smokeless tobacco use includes chew. He reports that he does not drink  alcohol and does not use drugs. 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 Surgical: Mr. Kretschmer  has a past surgical history that includes Spine surgery; Eye surgery; Fracture surgery (Right); Fracture surgery (Bilateral); Fracture surgery (Bilateral); Joint replacement (Right); Spinal cord stimulator implant; Spinal cord  stimulator removal; Patella arthroplasty; Shoulder arthroscopy (Bilateral); Esophagogastroduodenoscopy (egd) with propofol  (N/A, 01/20/2016); and Colonoscopy (2009). Family: family history includes Arthritis in his father and mother; COPD in his father; Cancer in his father and mother; Diabetes in his mother; Hematuria in his father and mother; Hypertension in his father and mother; Kidney disease in his mother; Kidney failure in his mother; Prostate cancer in his father; Stroke in his mother.  Laboratory Chemistry Profile   Renal Lab Results  Component Value Date   BUN 15 08/19/2019   CREATININE 1.47 (H) 08/19/2019   GFRAA 59 (L) 08/19/2019   GFRNONAA 51 (L) 08/19/2019    Hepatic Lab Results  Component Value Date   AST 25 08/19/2019   ALT 24 08/19/2019   ALBUMIN 4.2 08/19/2019   ALKPHOS 47 08/19/2019    Electrolytes Lab Results  Component Value Date   NA 138 08/19/2019   K 4.1 08/19/2019   CL 100 08/19/2019   CALCIUM 9.3 08/19/2019   MG 2.1 08/19/2019    Bone Lab Results  Component Value Date   VD25OH 26.19 (L) 08/19/2019   25OHVITD1 42 12/06/2016   25OHVITD2 <1.0 12/06/2016   25OHVITD3 42 12/06/2016   TESTOSTERONE  285 02/12/2018    Inflammation (CRP: Acute Phase) (ESR: Chronic Phase) Lab Results  Component Value Date   CRP <0.8 08/19/2019   ESRSEDRATE 1 08/19/2019         Note: Above Lab results reviewed.  Recent Imaging Review  MR LUMBAR SPINE WO CONTRAST CLINICAL DATA:  Low back pain, prior surgery, lumbar radiculopathy  EXAM: MRI LUMBAR SPINE WITHOUT CONTRAST  TECHNIQUE: Multiplanar, multisequence MR imaging of the lumbar spine was performed. No intravenous contrast was administered.  COMPARISON:  MRI of the lumbar spine 04/07/2015  FINDINGS: Segmentation: Standard.  Alignment:  Physiologic lumbar alignment is maintained.  Vertebrae: Operative changes of posterior decompression at L5-S1. Anterior and posterior fusion at these levels. Degenerative  endplate marrow changes at a few levels. Schmorl's nodes at a few levels. No compression fractures.  Conus medullaris and cauda equina: The conus medullaris terminates at the level of L1-L2. The distal spinal cord signal intensity is normal.  Paraspinal and other soft tissues: Renal cysts bilaterally. The visualized aorta is normal.  Disc levels:  L1-L2: Disc bulge. Mild bilateral facet arthropathy. Moderate left and mild right neuroforaminal stenosis. Moderate spinal canal stenosis.  L2-L3: Disc bulge. Moderate bilateral facet arthropathy. Moderate left neuroforaminal stenosis. Moderate spinal canal stenosis.  L3-L4: Disc bulge. Moderate facet arthropathy. Moderate bilateral neuroforaminal stenosis. No spinal canal stenosis.  L4-L5: Disc bulge. Moderate bilateral facet arthropathy. Moderate bilateral neuroforaminal stenosis. No spinal canal stenosis.  L5-S1: Interbody fusion. Facets are obscured. Moderate right and mild left neuroforaminal stenosis. No spinal canal stenosis.  IMPRESSION: 1. Moderate spinal canal stenosis at L1-L2 and L2-L3 secondary to disc bulging and facet arthropathy. 2. Moderate neuroforaminal stenosis at  multiple levels as above. 3. Posterior decompression and fusion at L5-S1.  Electronically Signed   By: Clem Savory M.D.   On: 05/04/2024 13:24 Note: Reviewed        Physical Exam  Vitals: BP (!) 140/101 (BP Location: Right Arm, Patient Position: Sitting)   Pulse 78   Temp 98.6 F (37 C) (Temporal)   Resp 20   Ht 5' 11 (1.803 m)   Wt 270 lb (122.5 kg)   SpO2 98%   BMI 37.66 kg/m  BMI: Estimated body mass index is 37.66 kg/m as calculated from the following:   Height as of this encounter: 5' 11 (1.803 m).   Weight as of this encounter: 270 lb (122.5 kg). Ideal: Ideal body weight: 75.3 kg (166 lb 0.1 oz) Adjusted ideal body weight: 94.2 kg (207 lb 9.7 oz) General appearance: Well nourished, well developed, and well hydrated. In no  apparent acute distress Mental status: Alert, oriented x 3 (person, place, & time)       Respiratory: No evidence of acute respiratory distress Eyes: PERLA  Musculoskeletal: +left hip pain worse with weightbearing, walking, and prolonged standing Left shoulder pain Lumbar Exam  Skin & Axial Inspection: No masses, redness, or swelling Alignment: Symmetrical Functional ROM: Pain restricted ROM       Stability: No instability detected Muscle Tone/Strength: Functionally intact. No obvious neuro-muscular anomalies detected. Sensory (Neurological): Musculoskeletal pain pattern Palpation: No palpable anomalies       Provocative Tests: Hyperextension/rotation test: deferred today       Lumbar quadrant test (Kemp's test): deferred today       Lateral bending test: deferred today       Patrick's Maneuver: (+) for left hip arthralgia  FABER* test: (+) for left hip arthralgia  Upper Extremity (UE) Exam      Right  Left  Inspection    Skin color, temperature, and hair growth are WNL. No peripheral edema or cyanosis. No masses, redness, swelling, asymmetry, or associated skin lesions. No contractures.  Skin color, temperature, and hair growth are WNL. No peripheral edema or cyanosis. No masses, redness, swelling, asymmetry, or associated skin lesions. No contractures.          Functional ROM    Unrestricted ROM          Pain restricted ROM for shoulder          Muscle Tone/Strength    Functionally intact. No obvious neuro-muscular anomalies detected.  Functionally intact. No obvious neuro-muscular anomalies detected.          Sensory (Neurological)    Musculoskeletal pain pattern          Musculoskeletal pain pattern affecting the shoulder          Palpation    No palpable anomalies              No palpable anomalies                      Maneuver Shoulder abduction (deltoid/supraspinatus, axillary/supra scapular n,, C5) Elbow flexion (biceps brachial, musculoskeletal n, C5-6) Elbow extension  (triceps, radial n, C7) Finger abduction (interossei, ulnar n, T1)    Shoulder abduction (deltoid/supraspinatus, axillary/supra scapular n,, C5) Elbow flexion (biceps brachial, musculoskeletal n, C5-6) Elbow extension (triceps, radial n, C7) Wrist extensors (C6) Finger extensors (C8) Finger abduction (interossei, ulnar n, T1)            Provocative Test    Phalen's test: deferred Tinel's test: deferred Apley's  scratch test (touch opposite shoulder):  Action 1 (Across chest): deferred Action 2 (Overhead): deferred Action 3 (LB reach): deferred  Phalen's test: deferred Tinel's test: deferred Apley's scratch test (touch opposite shoulder):  Action 1 (Across chest): Decreased ROM Action 2 (Overhead): Decreased ROM Action 3 (LB reach): Decreased ROM             Level  Myotome  Dermatome  Sclerotome  ROM  C5  Elbow flexion  Lateral upper arm      C6  Wrist extension  Thumb and index      C7  Elbow extension  Middle finger      C8  Finger extension  Ring and pinky finger      T1  Finger abduction  Medial elbow and axilla                                                                                                                                          *(Flexion, ABduction and External Rotation) Assessment   Diagnosis Status  1. Chronic left hip pain   2. Acute pain of left shoulder   3. Myofascial pain syndrome, cervical (rhomboid muscles) (intermittent)   4. Chronic musculoskeletal pain    Persistent Persistent Controlled   Updated Problems: Problem  Acute Pain of Left Shoulder  Chronic Hip Pain    Plan of Care  Problem-specific:  Assessment and Plan Plan: Left hip x-ray Left shoulder x-ray  We discussed obtaining imaging studies for further evaluation.    Mr. KAIGE WHISTLER has a current medication list which includes the following long-term medication(s): albuterol , amlodipine, naloxone , nitroglycerin, oxycodone  hcl, oxycodone  hcl, [START ON  09/11/2024] oxycodone  hcl, and tizanidine .  Pharmacotherapy (Medications Ordered): Meds ordered this encounter  Medications   tiZANidine  (ZANAFLEX ) 4 MG tablet    Sig: Take 1 tablet (4 mg total) by mouth 3 (three) times daily.    Dispense:  90 tablet    Refill:  2    Fill one day early if pharmacy is closed on scheduled refill date. May substitute for generic, or similar, if available. Void any older refills or prescriptions of this medication.   Orders:  Orders Placed This Encounter  Procedures   DG HIP UNILAT W OR W/O PELVIS 2-3 VIEWS LEFT    Please describe any evidence of DJD, such as joint narrowing, asymmetry, cysts, or any anomalies in bone density, production, or erosion.    Standing Status:   Future    Number of Occurrences:   1    Expiration Date:   11/13/2024    Scheduling Instructions:     Please make sure that the patient understands that this needs to be done as soon as possible. Never have the patient do the imaging just before the next appointment. Inform patient that having the imaging done within the [facility] will expedite the availability of the results and  will provide      imaging availability to the requesting physician. In addition inform the patient that the imaging order has an expiration date and will not be renewed if not done within the active period.    Reason for Exam (SYMPTOM  OR DIAGNOSIS REQUIRED):   Left hip pain/arthralgia    Preferred imaging location?:   [facility]    Call Results- Best Contact Number?:   [phone] [place] [facility]    Release to patient:   Immediate   DG Shoulder Left    Please make sure that the patient understands that this needs to be done as soon as possible. Never have the patient do the imaging just before the next appointment. Inform patient that having the imaging done within the [facility] will expedite the availability of the results and will provide imaging  availability to the requesting physician. In addition inform the patient that the imaging order has an expiration date and will not be renewed if not done within the active period.    Standing Status:   Future    Number of Occurrences:   1    Expiration Date:   11/13/2024    Scheduling Instructions:     Imaging must be done as soon as possible. Inform patient that order will expire within 30 days and I will not renew it.    Reason for Exam (SYMPTOM  OR DIAGNOSIS REQUIRED):   Left shoulder pain    Preferred imaging location?:   [facility]    Call Results- Best Contact Number?:   [phone]  [facility]    Release to patient:   Immediate        Return in about 1 week (around 08/20/2024) for (VV), review of ordered tests, Emmy Blanch NP.    Recent Visits Date Type Provider Dept  07/07/24 Office Visit Jalynn Betzold K, NP [facility]  Showing recent visits within past 90 days and meeting all other requirements Today's Visits Date Type Provider Dept  08/13/24 Office Visit Mirinda Monte K, NP [facility]  Showing today's visits and meeting all other requirements Future Appointments Date Type Provider Dept  08/19/24 Appointment Tanya Glisson, MD [facility]  09/28/24 Appointment Arlo Butt K, NP [facility]  Showing future appointments within next 90 days and meeting all other requirements  I discussed the assessment and treatment plan with the patient. The patient was provided an opportunity to ask questions and all were answered. The patient agreed with the plan and demonstrated an understanding of the instructions.  Patient advised to call back or seek an in-person evaluation if the symptoms or condition worsens.  Duration of encounter: 20 minutes.  Total time on encounter, as per AMA guidelines included both the face-to-face and non-face-to-face time personally spent by the physician  and/or other qualified health care professional(s) on the day of the encounter (includes time in activities that require the physician or other qualified health care professional and does not include time in activities normally performed by clinical staff). Physician's time may include the following activities when performed: Preparing to see the patient (e.g., pre-charting review of records, searching for previously ordered imaging, lab work, and nerve conduction tests) Review of prior analgesic pharmacotherapies. Reviewing PMP Interpreting ordered tests (e.g., lab work, imaging, nerve conduction tests) Performing post-procedure evaluations, including interpretation of diagnostic procedures Obtaining and/or reviewing separately obtained history Performing a medically appropriate  examination and/or evaluation Counseling and educating the patient/family/caregiver Ordering medications, tests, or procedures Referring and communicating with other health care professionals (when not separately reported) Documenting clinical information in the electronic or other health record Independently interpreting results (not separately reported) and communicating results to the patient/ family/caregiver Care coordination (not separately reported)  Note by: Sadiel Mota K Nickalaus Crooke, NP (TTS and AI technology used. I apologize for any typographical errors that were not detected and corrected.) Date: 08/13/2024; Time: 9:28 AM

## 2024-08-18 NOTE — Progress Notes (Unsigned)
 PROVIDER NOTE: Interpretation of information contained herein should be left to medically-trained personnel. Specific patient instructions are provided elsewhere under Patient Instructions section of medical record. This document was created in part using AI and STT-dictation technology, any transcriptional errors that may result from this process are unintentional.  Patient: James [name]  Service: E/M   PCP: Filbert Coad, MD  DOB: January 24, 1956  DOS: 08/19/2024  Provider: Eric DELENA Como, MD  MRN: [id number]  Delivery: Face-to-face  Specialty: Interventional Pain Management  Type: Established Patient  Setting: Ambulatory outpatient facility  Specialty designation: 09  Referring Prov.: Filbert Coad, MD  Location: Outpatient office facility       History of present illness (HPI) [name], a 68 y.o. year old male, is here today because of his No primary diagnosis found.. [name]'s primary complain today is No chief complaint on file.  Pertinent problems: Mr. Arko has Narrowing of intervertebral disc space; Chronic low back pain (1ry area of Pain) (Bilateral) (R>L); Lumbar spondylosis; Chronic neck pain (Right); Cervical spondylosis (C7 intravertebral body cyst); Chronic cervical radicular pain (Right); Chronic lumbar radicular pain (Bilateral) (L>R) (L5 Dermatome); Osteoarthritis; Chronic hip pain; Chronic knee pain (Bilateral) (R>L); Complex regional pain syndrome type II of upper extremity (Right); Chronic upper extremity pain (Right); 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 Carlin Hawthorne); Epidural fibrosis; Chronic musculoskeletal pain; Neurogenic pain; Neuropathic pain; Chronic sacroiliac joint pain (Bilateral) (R>L); Psoriatic arthritis (HCC); Chronic lower extremity  pain (2ry area of Pain) (Bilateral) (L>R); Psoriasis with arthropathy (HCC); Numbness and tingling of both legs; Abnormal nerve conduction studies (06/20/16); Chronic pain syndrome; Perineal pain; Chest pain with high risk for cardiac etiology; Chronic upper extremity pain(R>L); Cervicalgia; DDD (degenerative disc disease), cervical; Closed fracture of lumbar vertebral body (HCC) (L2); Neurogenic bladder s/p L2 fracture (04/19/2020); Lumbosacral radiculopathy at L5 (Right); Compression fracture of L2 lumbar vertebra, sequela; Abnormal MRI, cervical spine (09/19/2018); Trigger finger, ring finger (Left); Occipital neuralgia (midline); Trigger finger, ring finger (Right); Trigger finger, middle finger (Right); Right foot drop; and Closed L2 vertebral fracture (HCC) on their pertinent problem list.  Pain Assessment: Severity of   is reported as a  /10. Location:    / . Onset:  . Quality:  . Timing:  . Modifying factor(s):  SABRA Vitals:  vitals were not taken for this visit.  BMI: Estimated body mass index is 37.66 kg/m as calculated from the following:   Height as of 08/13/24: 5' 11 (1.803 m).   Weight as of 08/13/24: 270 lb (122.5 kg).  Last encounter: 01/08/2024. Last procedure: Visit date not found.  Reason for encounter: evaluation for possible interventional PM therapy/treatment. Follow-up on recent imaging studies of left shoulder and hip.  Discussed the use of AI scribe software for clinical note transcription with the patient, who gave verbal consent to proceed.  History of Present Illness           Pharmacotherapy Assessment   Oxycodone  IR 20 mg, 1 tab PO q 8 hrs (60 mg/day of oxycodone ) MME/day: 120 mg/day.   Monitoring: [place] PMP: PDMP reviewed during this encounter.       Pharmacotherapy: No side-effects or adverse reactions reported. Compliance: No problems identified. Effectiveness: Clinically acceptable.  No notes on file  UDS:  Summary  Date Value Ref Range Status  04/06/2024  FINAL  Final  Comment:    ==================================================================== [employer] 13 (MW) ==================================================================== Test                             Result       Flag       Units  Drug Present and Declared for Prescription Verification   Oxycodone                       2215         EXPECTED   ng/mg creat   Oxymorphone                    2240         EXPECTED   ng/mg creat   Noroxycodone                   5093         EXPECTED   ng/mg creat   Noroxymorphone                 968          EXPECTED   ng/mg creat    Sources of oxycodone  are scheduled prescription medications.    Oxymorphone, noroxycodone, and noroxymorphone are expected    metabolites of oxycodone . Oxymorphone is also available as a    scheduled prescription medication.  Drug Absent but Declared for Prescription Verification   Temazepam                      Not Detected UNEXPECTED ng/mg creat ==================================================================== Test                      Result    Flag   Units      Ref Range   Creatinine              104              mg/dL      >=79 ==================================================================== Declared Medications:  The flagging and interpretation on this report are based on the  following declared medications.  Unexpected results may arise from  inaccuracies in the declared medications.   **Note: The testing scope of this panel includes these medications:   Oxycodone   Temazepam (Restoril)   **Note: The testing scope of this panel does not include the  following reported medications:   Albuterol  (Ventolin  HFA)  Cholecalciferol  Levothyroxine (Synthroid)  Loratadine (Claritin)  Metoprolol (Toprol)  Multivitamin  Naloxone  (Narcan )  Tizanidine  (Zanaflex )  Vitamin B  Vitamin D2 (Ergocalciferol ) ==================================================================== For clinical  consultation, please call [phone]. ====================================================================     No results found for: CBDTHCR No results found for: D8THCCBX No results found for: D9THCCBX  ROS  Constitutional: Denies any fever or chills Gastrointestinal: No reported hemesis, hematochezia, vomiting, or acute GI distress Musculoskeletal: Denies any acute onset joint swelling, redness, loss of ROM, or weakness Neurological: No reported episodes of acute onset apraxia, aphasia, dysarthria, agnosia, amnesia, paralysis, loss of coordination, or loss of consciousness  Medication Review  Cholecalciferol, Ergocalciferol , Fluticasone-Umeclidin-Vilant, Oxycodone  HCl, albuterol , amLODipine, b complex vitamins, levothyroxine, loratadine, methotrexate, metoprolol succinate, multivitamin, naloxone , nitroGLYCERIN, predniSONE, temazepam, and tiZANidine   History Review  Allergy: Mr. Boruff is allergic to gabapentin, penicillins, lactose, nsaids, talwin [pentazocine], codeine, procaine, and sulfa antibiotics. Drug: Mr. Mey  reports no history of drug use. Alcohol:  reports no history of alcohol use. Tobacco:  reports  that he has quit smoking. His smoking use included cigarettes. His smokeless tobacco use includes chew. Social: Mr. Torrance  reports that he has quit smoking. His smoking use included cigarettes. His smokeless tobacco use includes chew. He reports that he does not drink alcohol and does not use drugs. Medical:  has a past medical history of Allergy, Anemia, Arthritis, Asthma, Blood transfusion without reported diagnosis, Bronchitis, Bursitis, Cancer (HCC), Cataract, 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09/13/2015), Emphysema of lung (HCC), Gastritis, GERD (gastroesophageal reflux disease), Heart murmur, Hepatitis C, Hiatal hernia, Hypertension, Hypothyroidism, Kidney stone, Lupus, Obesity, Osteoporosis, Peripheral nerve disease,  Psoriatic arthritis (HCC), Sleep apnea, Supraventricular tachycardia, Tendonitis, and Thyroid  disease. Surgical: Mr. Shakoor  has a past surgical history that includes Spine surgery; Eye surgery; Fracture surgery (Right); Fracture surgery (Bilateral); Fracture surgery (Bilateral); Joint replacement (Right); Spinal cord stimulator implant; Spinal cord stimulator removal; Patella arthroplasty; Shoulder arthroscopy (Bilateral); Esophagogastroduodenoscopy (egd) with propofol  (N/A, 01/20/2016); and Colonoscopy (2009). Family: family history includes Arthritis in his father and mother; COPD in his father; Cancer in his father and mother; Diabetes in his mother; Hematuria in his father and mother; Hypertension in his father and mother; Kidney disease in his mother; Kidney failure in his mother; Prostate cancer in his father; Stroke in his mother.  Laboratory Chemistry Profile   Renal Lab Results  Component Value Date   BUN 15 08/19/2019   CREATININE 1.47 (H) 08/19/2019   GFRAA 59 (L) 08/19/2019   GFRNONAA 51 (L) 08/19/2019    Hepatic Lab Results  Component Value Date   AST 25 08/19/2019   ALT 24 08/19/2019   ALBUMIN 4.2 08/19/2019   ALKPHOS 47 08/19/2019    Electrolytes Lab Results  Component Value Date   NA 138 08/19/2019   K 4.1 08/19/2019   CL 100 08/19/2019   CALCIUM 9.3 08/19/2019   MG 2.1 08/19/2019    Bone Lab Results  Component Value Date   VD25OH 26.19 (L) 08/19/2019   25OHVITD1 42 12/06/2016   25OHVITD2 <1.0 12/06/2016   25OHVITD3 42 12/06/2016   TESTOSTERONE  285 02/12/2018    Inflammation (CRP: Acute Phase) (ESR: Chronic Phase) Lab Results  Component Value Date   CRP <0.8 08/19/2019   ESRSEDRATE 1 08/19/2019         Note: Above Lab results reviewed.  Recent Imaging Review  DG Shoulder Left CLINICAL DATA:  Left shoulder pain, no stated injury  EXAM: LEFT SHOULDER - 2+ VIEW  COMPARISON:  None Available.  FINDINGS: There is no evidence of fracture or  dislocation. Mild acromioclavicular arthrosis. Glenohumeral joint spaces preserved. Soft tissues are unremarkable.  IMPRESSION: No fracture or dislocation of the left shoulder. Mild acromioclavicular arthrosis. Glenohumeral joint spaces preserved.  Electronically Signed   By: Marolyn JONETTA Jaksch M.D.   On: 08/17/2024 08:36 Note: Reviewed        Physical Exam  Vitals: There were no vitals taken for this visit. BMI: Estimated body mass index is 37.66 kg/m as calculated from the following:   Height as of 08/13/24: 5' 11 (1.803 m).   Weight as of 08/13/24: 270 lb (122.5 kg). Ideal: Ideal body weight: 75.3 kg (166 lb 0.1 oz) Adjusted ideal body weight: 94.2 kg (207 lb 9.7 oz) General appearance: Well nourished, well developed, and well hydrated. In no apparent acute distress Mental status: Alert, oriented x 3 (person, place, & time)       Respiratory: No evidence of acute respiratory distress Eyes: PERLA   Assessment   Diagnosis  Status  No diagnosis found. Controlled Controlled Controlled   Updated Problems: No problems updated.  Plan of Care  Problem-specific:  Assessment and Plan            Mr. YASUO PHIMMASONE has a current medication list which includes the following long-term medication(s): albuterol , amlodipine, naloxone , nitroglycerin, oxycodone  hcl, oxycodone  hcl, [START ON 09/11/2024] oxycodone  hcl, and tizanidine .  Pharmacotherapy (Medications Ordered): No orders of the defined types were placed in this encounter.  Orders:  No orders of the defined types were placed in this encounter.    Interventional Therapies  Risks  Complex Considerations:   A-fib; type 2 diabetes; low T; OSA; aortic valve insufficiency; opiate use (120 MME/day)   Planned  Pending:      Under consideration:      Completed:   Therapeutic left ring trigger finger + right middle trigger finger inj. x1 (09/17/2022) (100/100/100/L:30R:90)  Therapeutic right L4-5 LESI x1 (06/26/2016)  (50/50/25)  Therapeutic bilateral lumbar facet MBB x2 (04/15/2017) (90/90/50/50)  Therapeutic left lumbar facet RFA x1 (07/18/17) (90/90/90/90)    Completed by other providers:   Lumbar spine surgery (01/23/2022). EMG & NCV (06/20/2016) by Arthea Farrow, MD [facility] neurolog[facility]) Dx: Generalized LE polyneuropathy + superimposed right lumbosacral radiculopathy   Therapeutic  Palliative (PRN) options:   Palliative right CESI  Palliative right L4-5 LESI #2   Palliative bilateral lumbar facet MBB  Palliative left lumbar facet RFA #2  Palliative bilateral cervical facet MBB    Pharmacological Recommendations:   Nonopioids transferred 08/31/2020: Zanaflex      No follow-ups on file.    Recent Visits Date Type Provider Dept  08/13/24 Office Visit Patel, Seema K, NP [facility]  07/07/24 Office Visit Patel, Seema K, NP [facility]  Showing recent visits within past 90 days and meeting all other requirements Future Appointments Date Type Provider Dept  08/19/24 Appointment Tanya Glisson, MD [facility]  09/28/24 Appointment Patel, Seema K, NP [facility]  Showing future appointments within next 90 days and meeting all other requirements  I discussed the assessment and treatment plan with the patient. The patient was provided an opportunity to ask questions and all were answered. The patient agreed with the plan and demonstrated an understanding of the instructions.  Patient advised to call back or seek an in-person evaluation if the symptoms or condition worsens.  Duration of encounter: *** minutes.  Total time on encounter, as per AMA guidelines included both the face-to-face and non-face-to-face time personally spent by the physician and/or other qualified health care professional(s) on the day of the encounter (includes time in activities that require the physician or other qualified health care professional and does not include time in activities  normally performed by clinical staff). Physician's time may include the following activities when performed: Preparing to see the patient (e.g., pre-charting review of records, searching for previously ordered imaging, lab work, and nerve conduction tests) Review of prior analgesic pharmacotherapies. Reviewing PMP Interpreting ordered tests (e.g., lab work, imaging, nerve conduction tests) Performing post-procedure evaluations, including interpretation of diagnostic procedures Obtaining and/or reviewing separately obtained history Performing a medically appropriate examination and/or evaluation Counseling and educating the patient/family/caregiver Ordering medications, tests, or procedures Referring and communicating with other health care professionals (when not separately reported) Documenting clinical information in the electronic or other health record Independently interpreting results (not separately reported) and communicating results to the patient/ family/caregiver Care coordination (not separately reported)  Note by: Glisson DELENA Tanya, MD (TTS and  AI technology used. I apologize for any typographical errors that were not detected and corrected.) Date: 08/19/2024; Time: 12:17 PM

## 2024-08-19 ENCOUNTER — Ambulatory Visit: Attending: Pain Medicine | Admitting: Pain Medicine

## 2024-08-19 ENCOUNTER — Encounter: Payer: Self-pay | Admitting: Pain Medicine

## 2024-08-19 VITALS — BP 137/74 | HR 79 | Temp 97.3°F | Ht 71.0 in | Wt 270.0 lb

## 2024-08-19 DIAGNOSIS — M545 Low back pain, unspecified: Secondary | ICD-10-CM | POA: Diagnosis not present

## 2024-08-19 DIAGNOSIS — M961 Postlaminectomy syndrome, not elsewhere classified: Secondary | ICD-10-CM | POA: Insufficient documentation

## 2024-08-19 DIAGNOSIS — M19012 Primary osteoarthritis, left shoulder: Secondary | ICD-10-CM | POA: Insufficient documentation

## 2024-08-19 DIAGNOSIS — M160 Bilateral primary osteoarthritis of hip: Secondary | ICD-10-CM | POA: Insufficient documentation

## 2024-08-19 DIAGNOSIS — G8929 Other chronic pain: Secondary | ICD-10-CM | POA: Insufficient documentation

## 2024-08-19 DIAGNOSIS — M25652 Stiffness of left hip, not elsewhere classified: Secondary | ICD-10-CM | POA: Insufficient documentation

## 2024-08-19 DIAGNOSIS — M25552 Pain in left hip: Secondary | ICD-10-CM | POA: Insufficient documentation

## 2024-08-19 DIAGNOSIS — M25551 Pain in right hip: Secondary | ICD-10-CM | POA: Insufficient documentation

## 2024-08-19 DIAGNOSIS — M25651 Stiffness of right hip, not elsewhere classified: Secondary | ICD-10-CM | POA: Insufficient documentation

## 2024-08-19 DIAGNOSIS — M25512 Pain in left shoulder: Secondary | ICD-10-CM | POA: Insufficient documentation

## 2024-08-19 NOTE — Progress Notes (Signed)
 Safety precautions to be maintained throughout the outpatient stay will include: orient to surroundings, keep bed in low position, maintain call bell within reach at all times, provide assistance with transfer out of bed and ambulation.

## 2024-08-19 NOTE — Patient Instructions (Addendum)
 ______________________________________________________________________    Procedure instructions  Stop blood-thinners  Do not eat or drink fluids (other than water) for 6 hours before your procedure  No water for 2 hours before your procedure  Take your blood pressure medicine with a sip of water  Arrive 30 minutes before your appointment  If sedation is planned, bring suitable driver. Nada, Gisele, & public transportation are NOT APPROVED)  Carefully read the Preparing for your procedure detailed instructions  If you have questions call us  at [phone]  Procedure appointments are for procedures only.   NO medication refills or new problem evaluations will be done on procedure days.   Only the scheduled, pre-approved procedure and side will be done.   ______________________________________________________________________     ______________________________________________________________________    Preparing for your procedure  Appointments: If you think you may not be able to keep your appointment, call 24-48 hours in advance to cancel. We need time to make it available to others.  Procedure visits are for procedures only. During your procedure appointment there will be: NO Prescription Refills*. NO medication changes or discussions*. NO discussion of disability issues*. NO unrelated pain problem evaluations*. NO evaluations to order other pain procedures*. *These will be addressed at a separate and distinct evaluation encounter on the provider's evaluation schedule and not during procedure days.  Instructions: Food intake: Avoid eating anything solid for at least 8 hours prior to your procedure. Clear liquid intake: You may take clear liquids such as water up to 2 hours prior to your procedure. (No carbonated drinks. No soda.) Transportation: Unless otherwise stated by your physician, bring a driver. (Driver cannot be a Market researcher, Pharmacist, community, or any other form of public  transportation.) Morning Medicines: Except for blood thinners, take all of your other morning medications with a sip of water. Make sure to take your heart and blood pressure medicines. If your blood pressure's lower number is above 100, the case will be rescheduled. Blood thinners: Make sure to stop your blood thinners as instructed.  If you take a blood thinner, but were not instructed to stop it, call our office [phone] and ask to talk to a nurse. Not stopping a blood thinner prior to certain procedures could lead to serious complications. Diabetics on insulin: Notify the staff so that you can be scheduled 1st case in the morning. If your diabetes requires high dose insulin, take only  of your normal insulin dose the morning of the procedure and notify the staff that you have done so. Preventing infections: Shower with an antibacterial soap the morning of your procedure.  Build-up your immune system: Take 1000 mg of Vitamin C with every meal (3 times a day) the day prior to your procedure. Antibiotics: Inform the nursing staff if you are taking any antibiotics or if you have any conditions that may require antibiotics prior to procedures. (Example: recent joint implants)   Pregnancy: If you are pregnant make sure to notify the nursing staff. Not doing so may result in injury to the fetus, including death.  Sickness: If you have a cold, fever, or any active infections, call and cancel or reschedule your procedure. Receiving steroids while having an infection may result in complications. Arrival: You must be in the facility at least 30 minutes prior to your scheduled procedure. Tardiness: Your scheduled time is also the cutoff time. If you do not arrive at least 15 minutes prior to your procedure, you will be rescheduled.  Children: Do not bring any children with  you. Make arrangements to keep them home. Dress appropriately: There is always a possibility that your clothing may get soiled. Avoid  long dresses. Valuables: Do not bring any jewelry or valuables.  Reasons to call and reschedule or cancel your procedure: (Following these recommendations will minimize the risk of a serious complication.) Surgeries: Avoid having procedures within 2 weeks of any surgery. (Avoid for 2 weeks before or after any surgery). Flu Shots: Avoid having procedures within 2 weeks of a flu shots or . (Avoid for 2 weeks before or after immunizations). Barium: Avoid having a procedure within 7-10 days after having had a radiological study involving the use of radiological contrast. (Myelograms, Barium swallow or enema study). Heart attacks: Avoid any elective procedures or surgeries for the initial 6 months after a Myocardial Infarction (Heart Attack). Blood thinners: It is imperative that you stop these medications before procedures. Let us  know if you if you take any blood thinner.  Infection: Avoid procedures during or within two weeks of an infection (including chest colds or gastrointestinal problems). Symptoms associated with infections include: Localized redness, fever, chills, night sweats or profuse sweating, burning sensation when voiding, cough, congestion, stuffiness, runny nose, sore throat, diarrhea, nausea, vomiting, cold or Flu symptoms, recent or current infections. It is specially important if the infection is over the area that we intend to treat. Heart and lung problems: Symptoms that may suggest an active cardiopulmonary problem include: cough, chest pain, breathing difficulties or shortness of breath, dizziness, ankle swelling, uncontrolled high or unusually low blood pressure, and/or palpitations. If you are experiencing any of these symptoms, cancel your procedure and contact your primary care physician for an evaluation.  Remember:  Regular Business hours are:  Monday to Thursday 8:00 AM to 4:00 PM  Provider's Schedule: Eric Como, MD:  Procedure days: Tuesday and Thursday 7:30  AM to 4:00 PM  Wallie Sherry, MD:  Procedure days: Monday and Wednesday 7:30 AM to 4:00 PM Last  Updated: 10/15/2023 ______________________________________________________________________     ______________________________________________________________________    General Risks and Possible Complications  Patient Responsibilities: It is important that you read this as it is part of your informed consent. It is our duty to inform you of the risks and possible complications associated with treatments offered to you. It is your responsibility as a patient to read this and to ask questions about anything that is not clear or that you believe was not covered in this document.  Patient's Rights: You have the right to refuse treatment. You also have the right to change your mind, even after initially having agreed to have the treatment done. However, under this last option, if you wait until the last second to change your mind, you may be charged for the materials used up to that point.  Introduction: Medicine is not an Visual merchandiser. Everything in Medicine, including the lack of treatment(s), carries the potential for danger, harm, or loss (which is by definition: Risk). In Medicine, a complication is a secondary problem, condition, or disease that can aggravate an already existing one. All treatments carry the risk of possible complications. The fact that a side effects or complications occurs, does not imply that the treatment was conducted incorrectly. It must be clearly understood that these can happen even when everything is done following the highest safety standards.  No treatment: You can choose not to proceed with the proposed treatment alternative. The "PRO(s)" would include: avoiding the risk of complications associated with the therapy. The "CON(s)" would include:  not getting any of the treatment benefits. These benefits fall under one of three categories: diagnostic; therapeutic; and/or  palliative. Diagnostic benefits include: getting information which can ultimately lead to improvement of the disease or symptom(s). Therapeutic benefits are those associated with the successful treatment of the disease. Finally, palliative benefits are those related to the decrease of the primary symptoms, without necessarily curing the condition (example: decreasing the pain from a flare-up of a chronic condition, such as incurable terminal cancer).  General Risks and Complications: These are associated to most interventional treatments. They can occur alone, or in combination. They fall under one of the following six (6) categories: no benefit or worsening of symptoms; bleeding; infection; nerve damage; allergic reactions; and/or death. No benefits or worsening of symptoms: In Medicine there are no guarantees, only probabilities. No healthcare provider can ever guarantee that a medical treatment will work, they can only state the probability that it may. Furthermore, there is always the possibility that the condition may worsen, either directly, or indirectly, as a consequence of the treatment. Bleeding: This is more common if the patient is taking a blood thinner, either prescription or over the counter (example: Goody Powders, Fish oil, Aspirin, Garlic, etc.), or if suffering a condition associated with impaired coagulation (example: Hemophilia, cirrhosis of the liver, low platelet counts, etc.). However, even if you do not have one on these, it can still happen. If you have any of these conditions, or take one of these drugs, make sure to notify your treating physician. Infection: This is more common in patients with a compromised immune system, either due to disease (example: diabetes, cancer, human immunodeficiency virus [HIV], etc.), or due to medications or treatments (example: therapies used to treat cancer and rheumatological diseases). However, even if you do not have one on these, it can still  happen. If you have any of these conditions, or take one of these drugs, make sure to notify your treating physician. Nerve Damage: This is more common when the treatment is an invasive one, but it can also happen with the use of medications, such as those used in the treatment of cancer. The damage can occur to small secondary nerves, or to large primary ones, such as those in the spinal cord and brain. This damage may be temporary or permanent and it may lead to impairments that can range from temporary numbness to permanent paralysis and/or brain death. Allergic Reactions: Any time a substance or material comes in contact with our body, there is the possibility of an allergic reaction. These can range from a mild skin rash (contact dermatitis) to a severe systemic reaction (anaphylactic reaction), which can result in death. Death: In general, any medical intervention can result in death, most of the time due to an unforeseen complication. ______________________________________________________________________      ______________________________________________________________________    Steroid injections  Common steroids for injections Triamcinolone : Used by many sports medicine physicians for large joint and bursal injections, often combined with a local anesthetic like lidocaine . A study focusing on coccydynia (tailbone pain) found triamcinolone  was more effective than betamethasone , suggesting it may also be preferable for other localized inflammation conditions. Methylprednisolone: A common alternative to triamcinolone  that is also a strong anti-inflammatory. It is available in different formulations, with the acetate suspension being the long-acting option for intra-articular injections. Dexamethasone : This is a non-particulate steroid, meaning it has a lower risk of tissue damage compared to particulate steroids like triamcinolone  and methylprednisolone. While less common for this specific  use,  it is an option for targeted injections.   Considerations for physicians Particulate vs. non-particulate steroids: Triamcinolone  and methylprednisolone are particulate, meaning they can clump together. Dexamethasone  is non-particulate. Particulate steroids are often preferred for their longer-lasting effects but carry a theoretical higher risk for certain injections (though this is less of a concern in the costochondral joints). Combined injectate: Corticosteroids are typically mixed with a local anesthetic like lidocaine  to provide both immediate pain relief (from the anesthetic) and longer-term inflammation reduction (from the steroid). Imaging guidance: To ensure accurate placement of the needle and medication, physicians may use ultrasound or fluoroscopic guidance for the injection, especially in complex or refractory cases.   Patient guidance Before undergoing a steroid injection, discuss the options with your physician. They will determine the best steroid, dosage, and procedure for your specific case based on factors like: Severity of your condition History of response to other treatments Your overall health status Experience and preference of the physician  Last  Updated: 06/30/2024 ______________________________________________________________________     ______________________________________________________________________    Opioid Pain Medication Update  To: All patients taking opioid pain medications. (I.e.: hydrocodone, hydromorphone, oxycodone , oxymorphone, morphine, codeine, methadone, tapentadol, tramadol, buprenorphine, fentanyl , etc.)  Re: Updated review of side effects and adverse reactions of opioid analgesics, as well as new information about long term effects of this class of medications.  Direct risks of long-term opioid therapy are not limited to opioid addiction and overdose. Potential medical risks include serious fractures, breathing problems during sleep,  hyperalgesia, immunosuppression, chronic constipation, bowel obstruction, myocardial infarction, and tooth decay secondary to xerostomia.  Unpredictable adverse effects that can occur even if you take your medication correctly: Cognitive impairment, respiratory depression, and death. Most people think that if they take their medication correctly, and as instructed, that they will be safe. Nothing could be farther from the truth. In reality, a significant amount of recorded deaths associated with the use of opioids has occurred in individuals that had taken the medication for a long time, and were taking their medication correctly. The following are examples of how this can happen: Patient taking his/her medication for a long time, as instructed, without any side effects, is given a certain antibiotic or another unrelated medication, which in turn triggers a Drug-to-drug interaction leading to disorientation, cognitive impairment, impaired reflexes, respiratory depression or an untoward event leading to serious bodily harm or injury, including death.  Patient taking his/her medication for a long time, as instructed, without any side effects, develops an acute impairment of liver and/or kidney function. This will lead to a rapid inability of the body to breakdown and eliminate their pain medication, which will result in effects similar to an overdose, but with the same medicine and dose that they had always taken. This again may lead to disorientation, cognitive impairment, impaired reflexes, respiratory depression or an untoward event leading to serious bodily harm or injury, including death.  A similar problem will occur with patients as they grow older and their liver and kidney function begins to decrease as part of the aging process.  Background information: Historically, the original case for using long-term opioid therapy to treat chronic noncancer pain was based on safety assumptions that  subsequent experience has called into question. In 1996, the American Pain Society and the American Academy of Pain Medicine issued a consensus statement supporting long-term opioid therapy. This statement acknowledged the dangers of opioid prescribing but concluded that the risk for addiction was low; respiratory depression induced by opioids was short-lived, occurred  mainly in opioid-naive patients, and was antagonized by pain; tolerance was not a common problem; and efforts to control diversion should not constrain opioid prescribing. This has now proven to be wrong. Experience regarding the risks for opioid addiction, misuse, and overdose in community practice has failed to support these assumptions.  According to the Centers for Disease Control and Prevention, fatal overdoses involving opioid analgesics have increased sharply over the past decade. Currently, more than 96,700 people die from drug overdoses every year. Opioids are a factor in 7 out of every 10 overdose deaths. Deaths from drug overdose have surpassed motor vehicle accidents as the leading cause of death for individuals between the ages of 110 and 38.  Clinical data suggest that neuroendocrine dysfunction may be very common in both men and women, potentially causing hypogonadism, erectile dysfunction, infertility, decreased libido, osteoporosis, and depression. Recent studies linked higher opioid dose to increased opioid-related mortality. Controlled observational studies reported that long-term opioid therapy may be associated with increased risk for cardiovascular events. Subsequent meta-analysis concluded that the safety of long-term opioid therapy in elderly patients has not been proven.   Side Effects and adverse reactions: Common side effects: Drowsiness (sedation). Dizziness. Nausea and vomiting. Constipation. Physical dependence -- Dependence often manifests with withdrawal symptoms when opioids are discontinued or  decreased. Tolerance -- As you take repeated doses of opioids, you require increased medication to experience the same effect of pain relief. Respiratory depression -- This can occur in healthy people, especially with higher doses. However, people with COPD, asthma or other lung conditions may be even more susceptible to fatal respiratory impairment.  Uncommon side effects: An increased sensitivity to feeling pain and extreme response to pain (hyperalgesia). Chronic use of opioids can lead to this. Delayed gastric emptying (the process by which the contents of your stomach are moved into your small intestine). Muscle rigidity. Immune system and hormonal dysfunction. Quick, involuntary muscle jerks (myoclonus). Arrhythmia. Itchy skin (pruritus). Dry mouth (xerostomia).  Long-term side effects: Chronic constipation. Sleep-disordered breathing (SDB). Increased risk of bone fractures. Hypothalamic-pituitary-adrenal dysregulation. Increased risk of overdose.  RISKS: Respiratory depression and death: Opioids increase the risk of respiratory depression and death.  Drug-to-drug interactions: Opioids are relatively contraindicated in combination with benzodiazepines, sleep inducers, and other central nervous system depressants. Other classes of medications (i.e.: certain antibiotics and even over-the-counter medications) may also trigger or induce respiratory depression in some patients.  Medical conditions: Patients with pre-existing respiratory problems are at higher risk of respiratory failure and/or depression when in combination with opioid analgesics. Opioids are relatively contraindicated in some medical conditions such as central sleep apnea.   Fractures and Falls:  Opioids increase the risk and incidence of falls. This is of particular importance in elderly patients.  Endocrine System:  Long-term administration is associated with endocrine abnormalities (endocrinopathies). (Also known  as Opioid-induced Endocrinopathy) Influences on both the hypothalamic-pituitary-adrenal axis?and the hypothalamic-pituitary-gonadal axis have been demonstrated with consequent hypogonadism and adrenal insufficiency in both sexes. Hypogonadism and decreased levels of dehydroepiandrosterone sulfate have been reported in men and women. Endocrine effects include: Amenorrhoea in women (abnormal absence of menstruation) Reduced libido in both sexes Decreased sexual function Erectile dysfunction in men Hypogonadisms (decreased testicular function with shrinkage of testicles) Infertility Depression and fatigue Loss of muscle mass Anxiety Depression Immune suppression Hyperalgesia Weight gain Anemia Osteoporosis Patients (particularly women of childbearing age) should avoid opioids. There is insufficient evidence to recommend routine monitoring of asymptomatic patients taking opioids in the long-term for hormonal deficiencies.  Immune  System: Human studies have demonstrated that opioids have an immunomodulating effect. These effects are mediated via opioid receptors both on immune effector cells and in the central nervous system. Opioids have been demonstrated to have adverse effects on antimicrobial response and anti-tumour surveillance. Buprenorphine has been demonstrated to have no impact on immune function.  Opioid Induced Hyperalgesia: Human studies have demonstrated that prolonged use of opioids can lead to a state of abnormal pain sensitivity, sometimes called opioid induced hyperalgesia (OIH). Opioid induced hyperalgesia is not usually seen in the absence of tolerance to opioid analgesia. Clinically, hyperalgesia may be diagnosed if the patient on long-term opioid therapy presents with increased pain. This might be qualitatively and anatomically distinct from pain related to disease progression or to breakthrough pain resulting from development of opioid tolerance. Pain associated with  hyperalgesia tends to be more diffuse than the pre-existing pain and less defined in quality. Management of opioid induced hyperalgesia requires opioid dose reduction.  Cancer: Chronic opioid therapy has been associated with an increased risk of cancer among noncancer patients with chronic pain. This association was more evident in chronic strong opioid users. Chronic opioid consumption causes significant pathological changes in the small intestine and colon. Epidemiological studies have found that there is a link between opium dependence and initiation of gastrointestinal cancers. Cancer is the second leading cause of death after cardiovascular disease. Chronic use of opioids can cause multiple conditions such as GERD, immunosuppression and renal damage as well as carcinogenic effects, which are associated with the incidence of cancers.   Mortality: Long-term opioid use has been associated with increased mortality among patients with chronic non-cancer pain (CNCP).  Prescription of long-acting opioids for chronic noncancer pain was associated with a significantly increased risk of all-cause mortality, including deaths from causes other than overdose.  Reference: Von Korff M, Kolodny A, Deyo RA, Chou R. Long-term opioid therapy reconsidered. Ann Intern Med. 2011 Sep 6;155(5):325-8. doi: 10.7326/[id number]. PMID: 78106373; PMCID: [id number]. Kit JINNY Laurence CINDERELLA Pearley JINNY, Hayward RA, Dunn KM, [place] KP. Risk of adverse events in patients prescribed long-term opioids: A cohort study in the [employer]. Eur J Pain. 2019 May;23(5):908-922. doi: 10.1002/ejp.1357. Epub 2019 Jan 31. PMID: 69379883. Colameco S, Coren JS, Ciervo CA. Continuous opioid treatment for chronic noncancer pain: a time for moderation in prescribing. Postgrad Med. 2009 Jul;121(4):61-6. doi: 10.3810/pgm.2009.07.2032. PMID: 80358728. Gigi JONELLE Shlomo MILUS Levern IVER Conny RN, [place],  Blazina I, Lonell DASEN, Bougatsos C, Deyo RA. The effectiveness and risks of long-term opioid therapy for chronic pain: a systematic review for a [employer] to [employer]. Ann Intern Med. 2015 Feb 17;162(4):276-86. doi: 10.7326/M14-2559. PMID: 74418742. Rory CHRISTELLA Laurence [facility], [place] DM. NCHS Data Brief No. 22. [place]: Centers for Disease Control and Prevention; 2009. Sep, Increase in Fatal Poisonings Involving Opioid Analgesics in the [place] , 1999-2006. Song IA, Choi HR, Oh TK. Long-term opioid use and mortality in patients with chronic non-cancer pain: Ten-year follow-up study in [place] from 2010 through 2019. EClinicalMedicine. 2022 Jul 18;51:101558. doi: 10.1016/j.eclinm.2022.898441. PMID: 64124182; PMCID: EFR0695089. Huser, W., Schubert, T., Vogelmann, T. et al. All-cause mortality in patients with long-term opioid therapy compared with non-opioid analgesics for chronic non-cancer pain: a database study. BMC Med 18, 162 (2020). http://lester.info/ Rashidian H, Zendehdel K, Kamangar F, Malekzadeh R, Haghdoost AA. An Ecological Study of the Association between Opiate Use and Incidence of Cancers. [employer]. 2016 Fall;8(4):252-260. PMID: 71180443; PMCID: [id number].  Our Goal: Our goal is to control  your pain with means other than the use of opioid pain medications.  Our Recommendation: Talk to your physician about coming off of these medications. We can assist you with the tapering down and stopping these medicines. Based on the new information, even if you cannot completely stop the medication, a decrease in the dose may be associated with a lesser risk. Ask for other means of controlling the pain. Decrease or eliminate those factors that significantly contribute to your pain such as smoking, obesity, and a diet heavily tilted towards inflammatory nutrients.  Last Updated: 05/13/2023    ______________________________________________________________________

## 2024-09-01 ENCOUNTER — Ambulatory Visit
Admission: RE | Admit: 2024-09-01 | Discharge: 2024-09-01 | Disposition: A | Discharge status: Home / Self Care | Source: Ambulatory Visit | Attending: Pain Medicine | Admitting: Pain Medicine

## 2024-09-01 ENCOUNTER — Encounter: Payer: Self-pay | Admitting: Pain Medicine

## 2024-09-01 ENCOUNTER — Ambulatory Visit (HOSPITAL_BASED_OUTPATIENT_CLINIC_OR_DEPARTMENT_OTHER): Admitting: Pain Medicine

## 2024-09-01 VITALS — BP 145/85 | HR 79 | Temp 97.5°F | Resp 16 | Ht 71.0 in | Wt 270.0 lb

## 2024-09-01 DIAGNOSIS — M25552 Pain in left hip: Secondary | ICD-10-CM | POA: Diagnosis not present

## 2024-09-01 DIAGNOSIS — M25551 Pain in right hip: Secondary | ICD-10-CM | POA: Diagnosis present

## 2024-09-01 DIAGNOSIS — G8929 Other chronic pain: Secondary | ICD-10-CM | POA: Diagnosis present

## 2024-09-01 DIAGNOSIS — M160 Bilateral primary osteoarthritis of hip: Secondary | ICD-10-CM | POA: Insufficient documentation

## 2024-09-01 DIAGNOSIS — M150 Primary generalized (osteo)arthritis: Secondary | ICD-10-CM | POA: Insufficient documentation

## 2024-09-01 MED ORDER — METHYLPREDNISOLONE ACETATE 80 MG/ML IJ SUSP
160.0000 mg | Freq: Once | INTRAMUSCULAR | Status: AC
  Administered 2024-09-01: 160 mg via INTRA_ARTICULAR
  Filled 2024-09-01: qty 2

## 2024-09-01 MED ORDER — IOHEXOL 180 MG/ML  SOLN
10.0000 mL | Freq: Once | INTRAMUSCULAR | Status: AC
  Administered 2024-09-01: 10 mL via INTRA_ARTICULAR
  Filled 2024-09-01: qty 20

## 2024-09-01 MED ORDER — PENTAFLUOROPROP-TETRAFLUOROETH EX AERO: INHALATION_SPRAY | Freq: Once | CUTANEOUS | Status: AC

## 2024-09-01 MED ORDER — MIDAZOLAM HCL (PF) 2 MG/2ML IJ SOLN
0.5000 mg | Freq: Once | INTRAMUSCULAR | Status: AC
  Administered 2024-09-01: 2 mg via INTRAVENOUS
  Filled 2024-09-01: qty 2

## 2024-09-01 MED ORDER — ROPIVACAINE HCL 2 MG/ML IJ SOLN
18.0000 mL | Freq: Once | INTRAMUSCULAR | Status: AC
  Administered 2024-09-01: 18 mL via INTRA_ARTICULAR
  Filled 2024-09-01: qty 20

## 2024-09-01 MED ORDER — LIDOCAINE HCL 2 % IJ SOLN
20.0000 mL | Freq: Once | INTRAMUSCULAR | Status: AC
  Administered 2024-09-01: 400 mg
  Filled 2024-09-01: qty 40

## 2024-09-01 NOTE — Progress Notes (Signed)
 PROVIDER NOTE: Interpretation of information contained herein should be left to medically-trained personnel. Specific patient instructions are provided elsewhere under Patient Instructions section of medical record. This document was created in part using STT-dictation technology, any transcriptional errors that may result from this process are unintentional.  Patient: James [name] Type: Established DOB: 09-Jan-1956 MRN: [id number] PCP: Filbert Coad, MD  Service: Procedure DOS: 09/01/2024 Setting: Ambulatory Location: Ambulatory outpatient facility Delivery: Face-to-face Provider: Eric DELENA Como, MD Specialty: Interventional Pain Management Specialty designation: 09 Location: Outpatient facility Ref. Prov.: Filbert Coad, MD       Interventional Therapy   Type: Hip & bursae injection #1  Laterality: Bilateral (-50)  Bursae: Trochanteric  Laterality: Bilateral (-50)  Approach: Percutaneous posterolateral approach. Level: Lower pelvic and hip joint level.  Imaging: Fluoroscopy-guided Non-spinal ([id number]) Anesthesia: Local anesthesia (1-2% Lidocaine ) Anxiolysis: IV Versed  2.0 mg Sedation: No Sedation                       DOS: 09/01/2024  Performed by: Eric DELENA Como, MD  Purpose: Diagnostic/Therapeutic Indications: Hip pain severe enough to impact quality of life or function. Rationale (medical necessity): procedure needed and proper for the diagnosis and/or treatment of Mr. Sherard's medical symptoms and needs. 1. Chronic hip pain (Bilateral) (L>R)   2. Primary osteoarthritis of hips (Bilateral)   3. Osteoarthritis involving multiple joints    NAS-11 Pain score:   Pre-procedure: 8 /10   Post-procedure: 2 /10      Target: Intra-articular aspect of the hip joint & peri-articular bursae Region: Hip joint proper. Femoral region Procedure Type: Percutaneous injection   Position / Prep / Materials:  Position: Prone  Prep solution: ChloraPrep (2% chlorhexidine  gluconate and 70% isopropyl alcohol) Prep Area:  Entire Posterolateral hip area. Materials:  Tray: Block tray Needle(s):  Type: Spinal  Gauge (G): 22  Length: 7-in  Qty: 2  H&P (Pre-op Assessment):  Mr. Bassford is a 68 y.o. (year old), male patient, seen today for interventional treatment. He  has a past surgical history that includes Spine surgery; Eye surgery; Fracture surgery (Right); Fracture surgery (Bilateral); Fracture surgery (Bilateral); Joint replacement (Right); Spinal cord stimulator implant; Spinal cord stimulator removal; Patella arthroplasty; Shoulder arthroscopy (Bilateral); Esophagogastroduodenoscopy (egd) with propofol  (N/A, 01/20/2016); and Colonoscopy (2009). Mr. Eimers has a current medication list which includes the following prescription(s): albuterol , amlodipine, b complex vitamins, cholecalciferol, ergocalciferol , trelegy ellipta, levothyroxine, loratadine, methotrexate, metoprolol succinate, multivitamin, naloxone , nitroglycerin, oxycodone  hcl, oxycodone  hcl, [START ON 09/11/2024] oxycodone  hcl, prednisone, prednisone, temazepam, and tizanidine . His primarily concern today is the Hip Pain  Initial Vital Signs:  Pulse/HCG Rate: 82ECG Heart Rate: 80 (nsr) Temp: (!) 97.2 F (36.2 C) Resp: 18 BP: (!) 175/81 SpO2: 97 %  BMI: Estimated body mass index is 37.66 kg/m as calculated from the following:   Height as of this encounter: 5' 11 (1.803 m).   Weight as of this encounter: 270 lb (122.5 kg).  Risk Assessment: Allergies: Reviewed. He is allergic to gabapentin, penicillins, lactose, nsaids, talwin [pentazocine], codeine, procaine, and sulfa antibiotics.  Allergy Precautions: None required Coagulopathies: Reviewed. None identified.  Blood-thinner therapy: None at this time Active Infection(s): Reviewed. None identified. Mr. Mathew is afebrile  Site Confirmation: Mr. Bischof was asked to confirm the procedure and laterality before marking the site Procedure  checklist: Completed Consent: Before the procedure and under the influence of no sedative(s), amnesic(s), or anxiolytics, the patient was informed of the treatment options, risks and possible complications. To fulfill  our ethical and legal obligations, as recommended by the American Medical Association's Code of Ethics, I have informed the patient of my clinical impression; the nature and purpose of the treatment or procedure; the risks, benefits, and possible complications of the intervention; the alternatives, including doing nothing; the risk(s) and benefit(s) of the alternative treatment(s) or procedure(s); and the risk(s) and benefit(s) of doing nothing. The patient was provided information about the general risks and possible complications associated with the procedure. These may include, but are not limited to: failure to achieve desired goals, infection, bleeding, organ or nerve damage, allergic reactions, paralysis, and death. In addition, the patient was informed of those risks and complications associated to the procedure, such as failure to decrease pain; infection; bleeding; organ or nerve damage with subsequent damage to sensory, motor, and/or autonomic systems, resulting in permanent pain, numbness, and/or weakness of one or several areas of the body; allergic reactions; (i.e.: anaphylactic reaction); and/or death. Furthermore, the patient was informed of those risks and complications associated with the medications. These include, but are not limited to: allergic reactions (i.e.: anaphylactic or anaphylactoid reaction(s)); adrenal axis suppression; blood sugar elevation that in diabetics may result in ketoacidosis or comma; water retention that in patients with history of congestive heart failure may result in shortness of breath, pulmonary edema, and decompensation with resultant heart failure; weight gain; swelling or edema; medication-induced neural toxicity; particulate matter embolism and  blood vessel occlusion with resultant organ, and/or nervous system infarction; and/or aseptic necrosis of one or more joints. Finally, the patient was informed that Medicine is not an exact science; therefore, there is also the possibility of unforeseen or unpredictable risks and/or possible complications that may result in a catastrophic outcome. The patient indicated having understood very clearly. We have given the patient no guarantees and we have made no promises. Enough time was given to the patient to ask questions, all of which were answered to the patient's satisfaction. Mr. Alessio has indicated that he wanted to continue with the procedure. Attestation: I, the ordering provider, attest that I have discussed with the patient the benefits, risks, side-effects, alternatives, likelihood of achieving goals, and potential problems during recovery for the procedure that I have provided informed consent. Date  Time: 09/01/2024  1:47 PM  Pre-Procedure Preparation:  Monitoring: As per clinic protocol. Respiration, ETCO2, SpO2, BP, heart rate and rhythm monitor placed and checked for adequate function Safety Precautions: Patient was assessed for positional comfort and pressure points before starting the procedure. Time-out: I initiated and conducted the Time-out before starting the procedure, as per protocol. The patient was asked to participate by confirming the accuracy of the Time Out information. Verification of the correct person, site, and procedure were performed and confirmed by me, the nursing staff, and the patient. Time-out conducted as per Joint Commission's Universal Protocol (UP.01.01.01). Time: 1412 Start Time: 1412 hrs.  Narrative                Rationale (medical necessity): procedure needed and proper for the diagnosis and/or treatment of the patient's medical symptoms and needs. Procedural Technique Safety Precautions: Aspiration looking for blood return was conducted prior  to all injections. At no point did we inject any substances, as a needle was being advanced. No attempts were made at seeking any paresthesias. Safe injection practices and needle disposal techniques used. Medications properly checked for expiration dates. SDV (single dose vial) medications used. Description of the Procedure: Protocol guidelines were followed. The patient was assisted into a  comfortable position. The target area was identified and the area prepped in the usual manner. Skin & deeper tissues infiltrated with local anesthetic. Appropriate amount of time allowed to pass for local anesthetics to take effect. The procedure needles were then advanced to the target area. Proper needle placement secured. Negative aspiration confirmed. Solution injected in intermittent fashion, asking for systemic symptoms every 0.5cc of injectate. The needles were then removed and the area cleansed, making sure to leave some of the prepping solution back to take advantage of its long term bactericidal properties.  Technical description of procedure:  Skin & deeper tissues infiltrated with local anesthetic. Appropriate amount of time allowed to pass for local anesthetics to take effect. The procedure needles were then advanced to the target area. Proper needle placement secured. Negative aspiration confirmed. Solution injected in intermittent fashion, asking for systemic symptoms every 0.5cc of injectate. The needles were then removed and the area cleansed, making sure to leave some of the prepping solution back to take advantage of its long term bactericidal properties.                          Vitals:   09/01/24 1411 09/01/24 1416 09/01/24 1422 09/01/24 1430  BP: (!) 167/107 (!) 144/105 (!) 154/110 (!) 145/85  Pulse:    79  Resp: 18 18 17 16   Temp:    (!) 97.5 F (36.4 C)  TempSrc:    Temporal  SpO2: 99% 99% 98% 98%  Weight:      Height:         Start Time: 1412 hrs. End Time: 1422 hrs.  Imaging  Guidance (Non-Spinal):          Type of Imaging Technique: Fluoroscopy Guidance (Non-Spinal) Indication(s): Fluoroscopy guidance for needle placement to enhance accuracy in procedures requiring precise needle localization for targeted delivery of medication in or near specific anatomical locations not easily accessible without such real-time imaging assistance. Exposure Time: Please see nurses notes. Contrast: Before injecting any contrast, we confirmed that the patient did not have an allergy to iodine, shellfish, or radiological contrast. Once satisfactory needle placement was completed at the desired level, radiological contrast was injected. Contrast injected under live fluoroscopy. No contrast complications. See chart for type and volume of contrast used. Fluoroscopic Guidance: I was personally present during the use of fluoroscopy. Tunnel Vision Technique used to obtain the best possible view of the target area. Parallax error corrected before commencing the procedure. Direction-depth-direction technique used to introduce the needle under continuous pulsed fluoroscopy. Once target was reached, antero-posterior, oblique, and lateral fluoroscopic projection used confirm needle placement in all planes. Images permanently stored in EMR. Interpretation: I personally interpreted the imaging intraoperatively. Adequate needle placement confirmed in multiple planes. Appropriate spread of contrast into desired area was observed. No evidence of afferent or efferent intravascular uptake. Permanent images saved into the patient's record.  Post-operative Assessment:  Post-procedure Vital Signs:  Pulse/HCG Rate: 7980 Temp: (!) 97.5 F (36.4 C) Resp: 16 BP: (!) 145/85 SpO2: 98 %  EBL: None  Complications: No immediate post-treatment complications observed by team, or reported by patient.  Note: The patient tolerated the entire procedure well. A repeat set of vitals were taken after the procedure and  the patient was kept under observation following institutional policy, for this type of procedure. Post-procedural neurological assessment was performed, showing return to baseline, prior to discharge. The patient was provided with post-procedure discharge instructions, including a section on how  to identify potential problems. Should any problems arise concerning this procedure, the patient was given instructions to immediately contact us , at any time, without hesitation. In any case, we plan to contact the patient by telephone for a follow-up status report regarding this interventional procedure.  Comments:  No additional relevant information.  Plan of Care (POC)  Orders:  Orders Placed This Encounter  Procedures   HIP INJECTION    Scheduling Instructions:     Side: Bilateral     Sedation: Patient's choice.     Date: 09/01/2024   DG PAIN [facility] C-ARM 1-60 MIN NO REPORT    Intraoperative interpretation by procedural physician at [facility].    Standing Status:   Standing    Number of Occurrences:   1    Reason for exam::   Assistance in needle guidance and placement for procedures requiring needle placement in or near specific anatomical locations not easily accessible without such assistance.   Informed Consent Details: Physician/Practitioner Attestation; Transcribe to consent form and obtain patient signature    Nursing Order: Transcribe to consent form and obtain patient signature. Note: Always confirm laterality of pain with Mr. Hardcastle, before procedure.    Physician/Practitioner attestation of informed consent for procedure/surgical case:   I, the physician/practitioner, attest that I have discussed with the patient the benefits, risks, side effects, alternatives, likelihood of achieving goals and potential problems during recovery for the procedure that I have provided informed consent.    Procedure:   Hip injection    Physician/Practitioner performing the procedure:    Kavon Valenza A. Areen Trautner, MD    Indication/Reason:   Hip Joint Pain (Arthralgia)   Provide equipment / supplies at bedside    Procedure tray: Block Tray (Disposable  single use) Skin infiltration needle: Regular 1.5-in, 25-G, (x1) Block Needle type: Spinal Amount/quantity: 2 Size: Long (7-inch) Gauge: 22G    Standing Status:   Standing    Number of Occurrences:   1    Specify:   Block Tray   Saline lock IV    Have LR [phone] mL available and administer at 125 mL/hr if patient becomes hypotensive.    Standing Status:   Standing    Number of Occurrences:   1     Opioid Analgesic: Oxycodone  IR 20 mg, 1 tab PO q 8 hrs (60 mg/day of oxycodone ) MME/day: 90 mg/day.    Medications ordered for procedure: Meds ordered this encounter  Medications   iohexol (OMNIPAQUE) 180 MG/ML injection 10 mL    Must be Myelogram-compatible. If not available, you may substitute with a water-soluble, non-ionic, hypoallergenic, myelogram-compatible radiological contrast medium.   lidocaine  (XYLOCAINE ) 2 % (with pres) injection 400 mg   pentafluoroprop-tetrafluoroeth (GEBAUERS) aerosol   midazolam  PF (VERSED ) injection 0.5-2 mg    Make sure Flumazenil is available in the pyxis when using this medication. If oversedation occurs, administer 0.2 mg IV over 15 sec. If after 45 sec no response, administer 0.2 mg again over 1 min; may repeat at 1 min intervals; not to exceed 4 doses (1 mg)   ropivacaine  (PF) 2 mg/mL (0.2%) (NAROPIN ) injection 18 mL   methylPREDNISolone acetate (DEPO-MEDROL) injection 160 mg   Medications administered: We administered iohexol, lidocaine , pentafluoroprop-tetrafluoroeth, midazolam  PF, ropivacaine  (PF) 2 mg/mL (0.2%), and methylPREDNISolone acetate.  See the medical record for exact dosing, route, and time of administration.    Interventional Therapies  Risks  Complex Considerations:   A-fib  T2NIDDM  low T  OSA  AoV insufficiency  opiate tolerance (90 MME/day)         Planned  Pending:   Diagnostic/therapeutic bilateral IA hip injection #1    Under consideration:   Diagnostic/therapeutic bilateral IA hip injection #1  Diagnostic/therapeutic left IA glenohumeral and acromioclavicular joint injection #1  Diagnostic/therapeutic left suprascapular nerve block #1 with possible RFA follow-up.   Completed:   Therapeutic left ring trigger finger + right middle trigger finger inj. x1 (09/17/2022) (100/100/100/L:30R:90)  Therapeutic right L4-5 LESI x1 (06/26/2016) (50/50/25)  Therapeutic bilateral lumbar facet MBB x2 (04/15/2017) (90/90/50/50)  Therapeutic left lumbar facet RFA x1 (07/18/17) (90/90/90/90)    Completed by other providers:   Lumbar spine surgery (01/23/2022). EMG & NCV (06/20/2016) by Arthea Farrow, MD [facility] neurology) Dx: Generalized LE polyneuropathy + superimposed right lumbosacral radiculopathy   Therapeutic  Palliative (PRN) options:   Palliative right CESI  Palliative right L4-5 LESI #2   Palliative bilateral lumbar facet MBB  Palliative left lumbar facet RFA #2  Palliative bilateral cervical facet MBB    Pharmacological Recommendations:   Nonopioids transferred 08/31/2020: Zanaflex       Follow-up plan:   Return in about 2 weeks (around 09/15/2024) for (Face2F), (PPE).     Recent Visits Date Type Provider Dept  08/19/24 Office Visit Tanya Glisson, MD [facility]  08/13/24 Office Visit Patel, Seema K, NP [facility]  07/07/24 Office Visit Patel, Seema K, NP [facility]  Showing recent visits within past 90 days and meeting all other requirements Today's Visits Date Type Provider Dept  09/01/24 Procedure visit Tanya Glisson, MD [facility]  Showing today's visits and meeting all other requirements Future Appointments Date Type Provider Dept  09/23/24 Appointment Tanya Glisson, MD [facility]  09/28/24 Appointment Patel, Seema K, NP [facility]   Showing future appointments within next 90 days and meeting all other requirements   Disposition: Discharge home  Discharge (Date  Time): 09/01/2024; 1432 hrs.   Primary Care Physician: Filbert Coad, MD Location: [facility] Note by: Glisson DELENA Tanya, MD (TTS technology used. I apologize for any typographical errors that were not detected and corrected.) Date: 09/01/2024; Time: 2:49 PM  Disclaimer:  Medicine is not an visual merchandiser. The only guarantee in medicine is that nothing is guaranteed. It is important to note that the decision to proceed with this intervention was based on the information collected from the patient. The Data and conclusions were drawn from the patient's questionnaire, the interview, and the physical examination. Because the information was provided in large part by the patient, it cannot be guaranteed that it has not been purposely or unconsciously manipulated. Every effort has been made to obtain as much relevant data as possible for this evaluation. It is important to note that the conclusions that lead to this procedure are derived in large part from the available data. Always take into account that the treatment will also be dependent on availability of resources and existing treatment guidelines, considered by other Pain Management Practitioners as being common knowledge and practice, at the time of the intervention. For Medico-Legal purposes, it is also important to point out that variation in procedural techniques and pharmacological choices are the acceptable norm. The indications, contraindications, technique, and results of the above procedure should only be interpreted and judged by a Board-Certified Interventional Pain Specialist with extensive familiarity and expertise in the same exact procedure and technique.

## 2024-09-01 NOTE — Patient Instructions (Signed)
 ______________________________________________________________________    Post-Procedure Discharge Instructions  INSTRUCTIONS Apply ice:  Purpose: This will minimize any swelling and discomfort after procedure.  When: Day of procedure, as soon as you get home. How: Fill a plastic sandwich bag with crushed ice. Cover it with a small towel and apply to injection site. How long: (15 min on, 15 min off) Apply for 15 minutes then remove x 15 minutes.  Repeat sequence on day of procedure, until you go to bed. Apply heat:  Purpose: To treat any soreness and discomfort from the procedure. When: Starting the next day after the procedure. How: Apply heat to procedure site starting the day following the procedure. How long: May continue to repeat daily, until discomfort goes away. Food intake: Start with clear liquids (like water) and advance to regular food, as tolerated.  Physical activities: Keep activities to a minimum for the first 8 hours after the procedure. After that, then as tolerated. Driving: If you have received any sedation, be responsible and do not drive. You are not allowed to drive for 24 hours after having sedation. Blood thinner: (Applies only to those taking blood thinners) You may restart your blood thinner 6 hours after your procedure. Insulin: (Applies only to Diabetic patients taking insulin) As soon as you can eat, you may resume your normal dosing schedule. Infection prevention: Keep procedure site clean and dry. Shower daily and clean area with soap and water.  PAIN DIARY Post-procedure Pain Diary: Extremely important that this be done correctly and accurately. Recorded information will be used to determine the next step in treatment. For the purpose of accuracy, follow these rules: Evaluate only the area treated. Do not report or include pain from an untreated area. For the purpose of this evaluation, ignore all other areas of pain, except for the treated area. After your  procedure, avoid taking a long nap and attempting to complete the pain diary after you wake up. Instead, set your alarm clock to go off every hour, on the hour, for the initial 8 hours after the procedure. Document the duration of the numbing medicine, and the relief you are getting from it. Do not go to sleep and attempt to complete it later. It will not be accurate. If you received sedation, it is likely that you were given a medication that may cause amnesia. Because of this, completing the diary at a later time may cause the information to be inaccurate. This information is needed to plan your care. Follow-up appointment: Keep your post-procedure follow-up evaluation appointment after the procedure (usually 2 weeks for most procedures, 6 weeks for radiofrequencies). DO NOT FORGET to bring you pain diary with you.   EXPECT... (What should I expect to see with my procedure?) From numbing medicine (AKA: Local Anesthetics): Numbness or decrease in pain. You may also experience some weakness, which if present, could last for the duration of the local anesthetic. Onset: Full effect within 15 minutes of injected. Duration: It will depend on the type of local anesthetic used. On the average, 1 to 8 hours.  From steroids (Applies only if steroids were used): Decrease in swelling or inflammation. Once inflammation is improved, relief of the pain will follow. Onset of benefits: Depends on the amount of swelling present. The more swelling, the longer it will take for the benefits to be seen. In some cases, up to 10 days. Duration: Steroids will stay in the system x 2 weeks. Duration of benefits will depend on multiple posibilities including persistent irritating  factors. Side-effects: If present, they may typically last 2 weeks (the duration of the steroids). Frequent: Cramps (if they occur, drink Gatorade and take over-the-counter Magnesium 450-500 mg once to twice a day); water retention with temporary weight  gain; increases in blood sugar; decreased immune system response; increased appetite. Occasional: Facial flushing (red, warm cheeks); mood swings; menstrual changes. Uncommon: Long-term decrease or suppression of natural hormones; bone thinning. (These are more common with higher doses or more frequent use. This is why we prefer that our patients avoid having any injection therapies in other practices.)  Very Rare: Severe mood changes; psychosis; aseptic necrosis. From procedure: Some discomfort is to be expected once the numbing medicine wears off. This should be minimal if ice and heat are applied as instructed.  CALL IF... (When should I call?) You experience numbness and weakness that gets worse with time, as opposed to wearing off. New onset bowel or bladder incontinence. (Applies only to procedures done in the spine)  Emergency Numbers: Durning business hours (Monday - Thursday, 8:00 AM - 4:00 PM) (Friday, 9:00 AM - 12:00 Noon): [phone] After hours: [phone] NOTE: If you are having a problem and are unable connect with, or to talk to a provider, then go to your nearest urgent care or emergency department. If the problem is serious and urgent, please call 911. ______________________________________________________________________     ______________________________________________________________________    Steroid injections  Common steroids for injections Triamcinolone : Used by many sports medicine physicians for large joint and bursal injections, often combined with a local anesthetic like lidocaine . A study focusing on coccydynia (tailbone pain) found triamcinolone  was more effective than betamethasone, suggesting it may also be preferable for other localized inflammation conditions. Methylprednisolone: A common alternative to triamcinolone  that is also a strong anti-inflammatory. It is available in different formulations, with the acetate suspension being the long-acting  option for intra-articular injections. Dexamethasone : This is a non-particulate steroid, meaning it has a lower risk of tissue damage compared to particulate steroids like triamcinolone  and methylprednisolone. While less common for this specific use, it is an option for targeted injections.   Considerations for physicians Particulate vs. non-particulate steroids: Triamcinolone  and methylprednisolone are particulate, meaning they can clump together. Dexamethasone  is non-particulate. Particulate steroids are often preferred for their longer-lasting effects but carry a theoretical higher risk for certain injections (though this is less of a concern in the costochondral joints). Combined injectate: Corticosteroids are typically mixed with a local anesthetic like lidocaine  to provide both immediate pain relief (from the anesthetic) and longer-term inflammation reduction (from the steroid). Imaging guidance: To ensure accurate placement of the needle and medication, physicians may use ultrasound or fluoroscopic guidance for the injection, especially in complex or refractory cases.   Patient guidance Before undergoing a steroid injection, discuss the options with your physician. They will determine the best steroid, dosage, and procedure for your specific case based on factors like: Severity of your condition History of response to other treatments Your overall health status Experience and preference of the physician  Last  Updated: 06/30/2024 ______________________________________________________________________

## 2024-09-01 NOTE — Progress Notes (Signed)
 Safety precautions to be maintained throughout the outpatient stay will include: orient to surroundings, keep bed in low position, maintain call bell within reach at all times, provide assistance with transfer out of bed and ambulation.

## 2024-09-02 ENCOUNTER — Telehealth: Payer: Self-pay

## 2024-09-02 NOTE — Telephone Encounter (Signed)
Denies any needs at this time. Instructed to call if needed. Patient with understanding. 

## 2024-09-03 ENCOUNTER — Other Ambulatory Visit: Payer: Self-pay | Admitting: Medical Genetics

## 2024-09-03 DIAGNOSIS — Z006 Encounter for examination for normal comparison and control in clinical research program: Secondary | ICD-10-CM

## 2024-09-11 ENCOUNTER — Telehealth: Payer: Self-pay | Admitting: Nurse Practitioner

## 2024-09-11 NOTE — Telephone Encounter (Signed)
 Needs PA

## 2024-09-14 NOTE — Telephone Encounter (Signed)
 done

## 2024-09-22 NOTE — Progress Notes (Unsigned)
 PROVIDER NOTE: Interpretation of information contained herein should be left to medically-trained personnel. Specific patient instructions are provided elsewhere under Patient Instructions section of medical record. This document was created in part using AI and STT-dictation technology, any transcriptional errors that may result from this process are unintentional.  Patient: James [name]  Service: E/M   PCP: Filbert Coad, MD  DOB: [birth year]  DOS: 09/23/2024  Provider: Eric DELENA Como, MD  MRN: [id number]  Delivery: Face-to-face  Specialty: Interventional Pain Management  Type: Established Patient  Setting: Ambulatory outpatient facility  Specialty designation: 09  Referring Prov.: Filbert Coad, MD  Location: Outpatient office facility       History of present illness (HPI) James [name], a 68 y.o. year old male, is here today because of his Chronic hip pain, bilateral [M25.551, M25.552, G89.29]. James [name]'s primary complain today is No chief complaint on file.  Pertinent problems: James [name] has Narrowing of intervertebral disc space; Chronic low back pain (1ry area of Pain) (Bilateral) (R>L) w/o sciatica; Lumbar spondylosis; Chronic neck pain (Right); Cervical spondylosis (C7 intravertebral body cyst); Chronic cervical radicular pain (Right); Chronic lumbar radicular pain (Bilateral) (L>R) (L5 Dermatome); Chronic hip pain (Bilateral) (L>R); Chronic knee pain (Bilateral) (R>L); Complex regional pain syndrome type II of upper extremity (Right); Chronic upper extremity pain (Right); Myofascial pain syndrome, cervical (rhomboid muscles) (intermittent); Lumbar facet syndrome (Bilateral) (R>L); Chronic knee pain (Left); Failed back surgical syndrome (2001 by Dr. Carlin Hawthorne); Epidural fibrosis; Chronic musculoskeletal pain; Neurogenic pain; Neuropathic pain; Chronic sacroiliac joint pain (Bilateral) (R>L); Psoriatic arthritis (HCC); Chronic lower extremity pain (2ry area of Pain)  (Bilateral) (L>R); Psoriasis with arthropathy (HCC); Numbness and tingling of both legs; Abnormal nerve conduction studies (06/20/16); Chronic pain syndrome; Perineal pain; Chest pain with high risk for cardiac etiology; Chronic upper extremity pain(R>L); Cervicalgia; DDD (degenerative disc disease), cervical; Closed fracture of lumbar vertebral body (HCC) (L2); Neurogenic bladder s/p L2 fracture (04/19/2020); Lumbosacral radiculopathy at L5 (Right); Compression fracture of L2 lumbar vertebra, sequela; Abnormal MRI, cervical spine (09/19/2018); Trigger finger, ring finger (Left); Occipital neuralgia (midline); Trigger finger, ring finger (Right); Trigger finger, middle finger (Right); Right foot drop; Chronic shoulder pain (Left); Sarcoidosis; Seronegative spondylitis; Impaired range of motion of hips (Bilateral); Primary osteoarthritis of hips (Bilateral); Arthralgia of left acromioclavicular joint; Osteoarthritis of left AC (acromioclavicular) joint; and Osteoarthritis involving multiple joints on their pertinent problem list.  Pain Assessment: Severity of   is reported as a  /10. Location:    / . Onset:  . Quality:  . Timing:  . Modifying factor(s):  SABRA Vitals:  vitals were not taken for this visit.  BMI: Estimated body mass index is 37.66 kg/m as calculated from the following:   Height as of 09/01/24: 5' 11 (1.803 m).   Weight as of 09/01/24: 270 lb (122.5 kg).  Last encounter: 08/19/2024. Last procedure: 09/01/2024.  Reason for encounter: post-procedure evaluation and assessment.   Discussed the use of AI scribe software for clinical note transcription with the patient, who gave verbal consent to proceed.  History of Present Illness          Post-Procedure Evaluation   Type: Hip & bursae injection #1  Laterality: Bilateral (-50)  Bursae: Trochanteric  Laterality: Bilateral (-50)  Approach: Percutaneous posterolateral approach. Level: Lower pelvic and hip joint level.  Imaging:  Fluoroscopy-guided Non-spinal ([id number]) Anesthesia: Local anesthesia (1-2% Lidocaine ) Anxiolysis: IV Versed  2.0 mg Sedation: No Sedation  DOS: 09/01/2024  Performed by: Eric DELENA Como, MD  Purpose: Diagnostic/Therapeutic Indications: Hip pain severe enough to impact quality of life or function. Rationale (medical necessity): procedure needed and proper for the diagnosis and/or treatment of James [name]'s medical symptoms and needs. 1. Chronic hip pain (Bilateral) (L>R)   2. Primary osteoarthritis of hips (Bilateral)   3. Osteoarthritis involving multiple joints    NAS-11 Pain score:   Pre-procedure: 8 /10   Post-procedure: 2 /10       Effectiveness:  Initial hour after procedure:   ***. Subsequent 4-6 hours post-procedure:   ***. Analgesia past initial 6 hours:   ***. Ongoing improvement:  Analgesic:  *** Function:    ***    ROM:    ***     Pharmacotherapy Assessment   Opioid Analgesic: Oxycodone  IR 20 mg, 1 tab PO q 8 hrs (60 mg/day of oxycodone ) MME/day: 90 mg/day.   Monitoring: [place] PMP: PDMP reviewed during this encounter.       Pharmacotherapy: No side-effects or adverse reactions reported. Compliance: No problems identified. Effectiveness: Clinically acceptable.  No notes on file  UDS:  Summary  Date Value Ref Range Status  04/06/2024 FINAL  Final    Comment:    ==================================================================== [employer] Select 13 (MW) ==================================================================== Test                             Result       Flag       Units  Drug Present and Declared for Prescription Verification   Oxycodone                       2215         EXPECTED   ng/mg creat   Oxymorphone                    2240         EXPECTED   ng/mg creat   Noroxycodone                   5093         EXPECTED   ng/mg creat   Noroxymorphone                 968          EXPECTED   ng/mg creat    Sources of  oxycodone  are scheduled prescription medications.    Oxymorphone, noroxycodone, and noroxymorphone are expected    metabolites of oxycodone . Oxymorphone is also available as a    scheduled prescription medication.  Drug Absent but Declared for Prescription Verification   Temazepam                      Not Detected UNEXPECTED ng/mg creat ==================================================================== Test                      Result    Flag   Units      Ref Range   Creatinine              104              mg/dL      >=79 ==================================================================== Declared Medications:  The flagging and interpretation on this report are based on the  following declared medications.  Unexpected results may arise from  inaccuracies in the declared medications.   **Note: The testing  scope of this panel includes these medications:   Oxycodone   Temazepam (Restoril)   **Note: The testing scope of this panel does not include the  following reported medications:   Albuterol  (Ventolin  HFA)  Cholecalciferol  Levothyroxine (Synthroid)  Loratadine (Claritin)  Metoprolol (Toprol)  Multivitamin  Naloxone  (Narcan )  Tizanidine  (Zanaflex )  Vitamin B  Vitamin D2 (Ergocalciferol ) ==================================================================== For clinical consultation, please call [phone]. ====================================================================     No results found for: CBDTHCR No results found for: D8THCCBX No results found for: D9THCCBX  ROS  Constitutional: Denies any fever or chills Gastrointestinal: No reported hemesis, hematochezia, vomiting, or acute GI distress Musculoskeletal: Denies any acute onset joint swelling, redness, loss of ROM, or weakness Neurological: No reported episodes of acute onset apraxia, aphasia, dysarthria, agnosia, amnesia, paralysis, loss of coordination, or loss of consciousness  Medication  Review  Cholecalciferol, Ergocalciferol , Fluticasone-Umeclidin-Vilant, Oxycodone  HCl, albuterol , amLODipine, b complex vitamins, levothyroxine, loratadine, methotrexate, metoprolol succinate, multivitamin, naloxone , nitroGLYCERIN, predniSONE, temazepam, and tiZANidine   History Review  Allergy: James [name] is allergic to gabapentin, penicillins, lactose, nsaids, talwin [pentazocine], codeine, procaine, and sulfa antibiotics. Drug: James [name]  reports no history of drug use. Alcohol:  reports no history of alcohol use. Tobacco:  reports that he has quit smoking. His smoking use included cigarettes. His smokeless tobacco use includes chew. Social: Mr. Milne  reports that he has quit smoking. His smoking use included cigarettes. His smokeless tobacco use includes chew. He reports that he does not drink alcohol and does not use drugs. 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 Surgical: James [name]  has a past surgical history that includes Spine surgery; Eye surgery; Fracture surgery (Right); Fracture surgery (Bilateral); Fracture surgery (Bilateral); Joint replacement (Right); Spinal cord stimulator implant; Spinal cord stimulator removal; Patella arthroplasty; Shoulder arthroscopy (Bilateral); Esophagogastroduodenoscopy (egd) with propofol  (N/A, 01/20/2016); and Colonoscopy (2009). Family: family history includes Arthritis in his father and mother; COPD in his father; Cancer in his  father and mother; Diabetes in his mother; Hematuria in his father and mother; Hypertension in his father and mother; Kidney disease in his mother; Kidney failure in his mother; Prostate cancer in his father; Stroke in his mother.  Laboratory Chemistry Profile   Renal Lab Results  Component Value Date   BUN 15 08/19/2019   CREATININE 1.47 (H) 08/19/2019   GFRAA 59 (L) 08/19/2019   GFRNONAA 51 (L) 08/19/2019    Hepatic Lab Results  Component Value Date   AST 25 08/19/2019   ALT 24 08/19/2019   ALBUMIN 4.2 08/19/2019   ALKPHOS 47 08/19/2019    Electrolytes Lab Results  Component Value Date   NA 138 08/19/2019   K 4.1 08/19/2019   CL 100 08/19/2019   CALCIUM 9.3 08/19/2019   MG 2.1 08/19/2019    Bone Lab Results  Component Value Date   VD25OH 26.19 (L) 08/19/2019   25OHVITD1 42 12/06/2016   25OHVITD2 <1.0 12/06/2016   25OHVITD3 42 12/06/2016   TESTOSTERONE  285 02/12/2018    Inflammation (CRP: Acute Phase) (ESR: Chronic Phase) Lab Results  Component Value Date   CRP <0.8 08/19/2019   ESRSEDRATE 1 08/19/2019  Note: Above Lab results reviewed.  Recent Imaging Review  DG PAIN CLINIC C-ARM 1-60 MIN NO REPORT Fluoro was used, but no Radiologist interpretation will be provided.  Please refer to NOTES tab for provider progress note. Note: Reviewed        Physical Exam  Vitals: There were no vitals taken for this visit. BMI: Estimated body mass index is 37.66 kg/m as calculated from the following:   Height as of 09/01/24: 5' 11 (1.803 m).   Weight as of 09/01/24: 270 lb (122.5 kg). Ideal: Patient weight not recorded General appearance: Well nourished, well developed, and well hydrated. In no apparent acute distress Mental status: Alert, oriented x 3 (person, place, & time)       Respiratory: No evidence of acute respiratory distress Eyes: PERLA   Assessment   Diagnosis Status  1. Chronic hip pain (Bilateral) (L>R)   2. Primary osteoarthritis of hips  (Bilateral)   3. Osteoarthritis involving multiple joints   4. Impaired range of motion of hips (Bilateral)   5. Chronic low back pain (1ry area of Pain) (Bilateral) (R>L) w/o sciatica   6. Postop check    Controlled Controlled Controlled   Updated Problems: No problems updated.  Plan of Care  Problem-specific:  Assessment and Plan            James [name] has a current medication list which includes the following long-term medication(s): albuterol , amlodipine, naloxone , nitroglycerin, oxycodone  hcl, oxycodone  hcl, oxycodone  hcl, and tizanidine .  Pharmacotherapy (Medications Ordered): No orders of the defined types were placed in this encounter.  Orders:  No orders of the defined types were placed in this encounter.    Interventional Therapies  Risks  Complex Considerations:   A-fib  T2NIDDM  low T  OSA  AoV insufficiency  opiate tolerance (90 MME/day)        Planned  Pending:   Diagnostic/therapeutic bilateral IA hip injection #1    Under consideration:   Diagnostic/therapeutic bilateral IA hip injection #1  Diagnostic/therapeutic left IA glenohumeral and acromioclavicular joint injection #1  Diagnostic/therapeutic left suprascapular nerve block #1 with possible RFA follow-up.   Completed:   Therapeutic left ring trigger finger + right middle trigger finger inj. x1 (09/17/2022) (100/100/100/L:30R:90)  Therapeutic right L4-5 LESI x1 (06/26/2016) (50/50/25)  Therapeutic bilateral lumbar facet MBB x2 (04/15/2017) (90/90/50/50)  Therapeutic left lumbar facet RFA x1 (07/18/17) (90/90/90/90)    Completed by other providers:   Lumbar spine surgery (01/23/2022). EMG & NCV (06/20/2016) by Arthea Farrow, MD [facility] neurology) Dx: Generalized LE polyneuropathy + superimposed right lumbosacral radiculopathy   Therapeutic  Palliative (PRN) options:   Palliative right CESI  Palliative right L4-5 LESI #2   Palliative bilateral lumbar facet MBB  Palliative left  lumbar facet RFA #2  Palliative bilateral cervical facet MBB    Pharmacological Recommendations:   Nonopioids transferred 08/31/2020: Zanaflex      No follow-ups on file.    Recent Visits Date Type Provider Dept  09/01/24 Procedure visit Tanya Glisson, MD [facility]  08/19/24 Office Visit Tanya Glisson, MD [facility]  08/13/24 Office Visit Patel, Seema K, NP [facility]  07/07/24 Office Visit Patel, Seema K, NP [facility]  Showing recent visits within past 90 days and meeting all other requirements Future Appointments Date Type Provider Dept  09/23/24 Appointment Tanya Glisson, MD [facility]  09/28/24 Appointment Patel, Seema K, NP [facility]  Showing future appointments within next 90 days and meeting  all other requirements  I discussed the assessment and treatment plan with the patient. The patient was provided an opportunity to ask questions and all were answered. The patient agreed with the plan and demonstrated an understanding of the instructions.  Patient advised to call back or seek an in-person evaluation if the symptoms or condition worsens.  Duration of encounter: *** minutes.  Total time on encounter, as per AMA guidelines included both the face-to-face and non-face-to-face time personally spent by the physician and/or other qualified health care professional(s) on the day of the encounter (includes time in activities that require the physician or other qualified health care professional and does not include time in activities normally performed by clinical staff). Physician's time may include the following activities when performed: Preparing to see the patient (e.g., pre-charting review of records, searching for previously ordered imaging, lab work, and nerve conduction tests) Review of prior analgesic pharmacotherapies. Reviewing PMP Interpreting ordered tests (e.g., lab work, imaging, nerve  conduction tests) Performing post-procedure evaluations, including interpretation of diagnostic procedures Obtaining and/or reviewing separately obtained history Performing a medically appropriate examination and/or evaluation Counseling and educating the patient/family/caregiver Ordering medications, tests, or procedures Referring and communicating with other health care professionals (when not separately reported) Documenting clinical information in the electronic or other health record Independently interpreting results (not separately reported) and communicating results to the patient/ family/caregiver Care coordination (not separately reported)  Note by: Eric DELENA Como, MD (TTS and AI technology used. I apologize for any typographical errors that were not detected and corrected.) Date: 09/23/2024; Time: 12:30 PM

## 2024-09-23 ENCOUNTER — Ambulatory Visit: Attending: Pain Medicine | Admitting: Pain Medicine

## 2024-09-23 VITALS — BP 138/80 | HR 64 | Temp 97.3°F | Resp 16 | Ht 72.0 in | Wt 270.0 lb

## 2024-09-23 DIAGNOSIS — G8929 Other chronic pain: Secondary | ICD-10-CM | POA: Insufficient documentation

## 2024-09-23 DIAGNOSIS — M25651 Stiffness of right hip, not elsewhere classified: Secondary | ICD-10-CM | POA: Insufficient documentation

## 2024-09-23 DIAGNOSIS — M25552 Pain in left hip: Secondary | ICD-10-CM | POA: Diagnosis present

## 2024-09-23 DIAGNOSIS — Z09 Encounter for follow-up examination after completed treatment for conditions other than malignant neoplasm: Secondary | ICD-10-CM | POA: Insufficient documentation

## 2024-09-23 DIAGNOSIS — M25652 Stiffness of left hip, not elsewhere classified: Secondary | ICD-10-CM | POA: Insufficient documentation

## 2024-09-23 DIAGNOSIS — M150 Primary generalized (osteo)arthritis: Secondary | ICD-10-CM | POA: Insufficient documentation

## 2024-09-23 DIAGNOSIS — M160 Bilateral primary osteoarthritis of hip: Secondary | ICD-10-CM | POA: Diagnosis present

## 2024-09-23 DIAGNOSIS — M545 Low back pain, unspecified: Secondary | ICD-10-CM | POA: Insufficient documentation

## 2024-09-23 DIAGNOSIS — M25551 Pain in right hip: Secondary | ICD-10-CM | POA: Diagnosis present

## 2024-09-23 NOTE — Progress Notes (Signed)
 Safety precautions to be maintained throughout the outpatient stay will include: orient to surroundings, keep bed in low position, maintain call bell within reach at all times, provide assistance with transfer out of bed and ambulation.

## 2024-09-23 NOTE — Patient Instructions (Signed)

## 2024-09-28 ENCOUNTER — Ambulatory Visit: Attending: Nurse Practitioner | Admitting: Nurse Practitioner

## 2024-09-28 ENCOUNTER — Encounter: Payer: Self-pay | Admitting: Nurse Practitioner

## 2024-09-28 DIAGNOSIS — M79605 Pain in left leg: Secondary | ICD-10-CM | POA: Insufficient documentation

## 2024-09-28 DIAGNOSIS — G894 Chronic pain syndrome: Secondary | ICD-10-CM | POA: Insufficient documentation

## 2024-09-28 DIAGNOSIS — M47812 Spondylosis without myelopathy or radiculopathy, cervical region: Secondary | ICD-10-CM | POA: Diagnosis present

## 2024-09-28 DIAGNOSIS — G8929 Other chronic pain: Secondary | ICD-10-CM | POA: Diagnosis present

## 2024-09-28 DIAGNOSIS — M25562 Pain in left knee: Secondary | ICD-10-CM | POA: Diagnosis present

## 2024-09-28 DIAGNOSIS — M79604 Pain in right leg: Secondary | ICD-10-CM | POA: Insufficient documentation

## 2024-09-28 DIAGNOSIS — M542 Cervicalgia: Secondary | ICD-10-CM | POA: Insufficient documentation

## 2024-09-28 DIAGNOSIS — M545 Low back pain, unspecified: Secondary | ICD-10-CM | POA: Insufficient documentation

## 2024-09-28 DIAGNOSIS — M25561 Pain in right knee: Secondary | ICD-10-CM | POA: Insufficient documentation

## 2024-09-28 DIAGNOSIS — Z79891 Long term (current) use of opiate analgesic: Secondary | ICD-10-CM | POA: Diagnosis present

## 2024-09-28 DIAGNOSIS — M5412 Radiculopathy, cervical region: Secondary | ICD-10-CM | POA: Insufficient documentation

## 2024-09-28 DIAGNOSIS — Z79899 Other long term (current) drug therapy: Secondary | ICD-10-CM | POA: Diagnosis present

## 2024-09-28 DIAGNOSIS — M961 Postlaminectomy syndrome, not elsewhere classified: Secondary | ICD-10-CM | POA: Insufficient documentation

## 2024-09-28 MED ORDER — OXYCODONE HCL 20 MG PO TABS: 20.0000 mg | ORAL_TABLET | Freq: Three times a day (TID) | ORAL | 0 refills | Status: AC | PRN

## 2024-09-28 MED ORDER — OXYCODONE HCL 20 MG PO TABS
20.0000 mg | ORAL_TABLET | Freq: Three times a day (TID) | ORAL | 0 refills | Status: AC | PRN
Start: 2024-12-10 — End: 2025-01-09

## 2024-09-28 NOTE — Progress Notes (Signed)
 PROVIDER NOTE: Interpretation of information contained herein should be left to medically-trained personnel. Specific patient instructions are provided elsewhere under Patient Instructions section of medical record. This document was created in part using AI and STT-dictation technology, any transcriptional errors that may result from this process are unintentional.  Patient: James [name]  Service: E/M   PCP: Filbert Coad, MD  DOB: Jun 09, 1956  DOS: 09/28/2024  Provider: Emmy MARLA Blanch, NP  MRN: [id number]  Delivery: Face-to-face  Specialty: Interventional Pain Management  Type: Established Patient  Setting: Ambulatory outpatient facility  Specialty designation: 09  Referring Prov.: Filbert Coad, MD  Location: Outpatient office facility       History of present illness (HPI) [name], a 68 y.o. year old male, is here today because of his back pain, neck pain, bilateral leg pain and arm pain. Mr. Warman primary complain today is Back Pain, Leg Pain (bilateral), Neck Pain, and Arm Pain (bilateral)  Pertinent problems: Mr. 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   Pain Assessment: Severity of Chronic pain is reported as a 6 /10. Location: Back Lower/radiates to back of legs to feet. Onset: More than a month ago. Quality: Aching, Constant, Crushing, Discomfort, Numbness, Cramping, Sharp, Sore.  Timing: Constant. Modifying factor(s): rest, meds. Vitals:  height is 6' (1.829 m) and weight is 270 lb (122.5 kg). His temperature is 97.2 F (36.2 C) (abnormal). His blood pressure is 147/84 (abnormal) and his pulse is 78. His respiration is 18 and oxygen saturation is 97%.  BMI: Estimated body mass index is 36.62 kg/m as calculated from the following:   Height as of this encounter: 6' (1.829 m).   Weight as of this encounter: 270 lb (122.5 kg).  Last encounter: 08/13/2024. Last procedure: Visit date not found.  Reason for encounter: medication management. The patient indicates doing well with current medication regimen. No side effects or adverse reaction reported to medication. He complains lower back pain; however pain medication regimen provides functional improvement and pain relief.   Discussed the use of AI scribe software for clinical note transcription with the patient, who gave verbal consent to proceed.  History of Present Illness   James [name] is a 68 year old male who presents with muscle spasms and malabsorption issues.  He experiences severe muscle spasms, particularly in his legs, which are intense enough to disrupt his sleep. He is currently taking tizanidine , which he finds helpful in managing these spasms.  He has a history of malabsorption, necessitating high doses of supplements, including calcium, B vitamins, potassium, and magnesium. He takes 1400 to 2000 mg of magnesium daily and 10,000 units of vitamin D  daily, yet his vitamin D  levels remain on the lower end of normal.  He is currently taking oxycodone  20 mg every eight hours for pain management. Pharmacotherapy Assessment   Oxycodone  HCl 20 mg tablet by mouth every 8 hours as needed for pain. MME=90  Monitoring: [place] PMP: PDMP reviewed during this encounter.       Pharmacotherapy: No side-effects or  adverse reactions reported. Compliance: No problems identified. Effectiveness: Clinically  acceptable.  Rebecka Wolm HERO, RN  09/28/2024  9:41 AM  Sign when Signing Visit Nursing Pain Medication Assessment:  Safety precautions to be maintained throughout the outpatient stay will include: orient to surroundings, keep bed in low position, maintain call bell within reach at all times, provide assistance with transfer out of bed and ambulation.  Medication Inspection Compliance: Pill count conducted under aseptic conditions, in front of the patient. Neither the pills nor the bottle was removed from the patient's sight at any time. Once count was completed pills were immediately returned to the patient in their original bottle.  Medication: Oxycodone  IR Pill/Patch Count: 42 of 90 pills/patches remain Pill/Patch Appearance: Markings consistent with prescribed medication Bottle Appearance: Standard pharmacy container. Clearly labeled. Filled Date: 65 / 07 / 2025 Last Medication intake:  Today    UDS:  Summary  Date Value Ref Range Status  04/06/2024 FINAL  Final    Comment:    ==================================================================== [employer] [facility] (MW) ==================================================================== Test                             Result       Flag       Units  Drug Present and Declared for Prescription Verification   Oxycodone                       2215         EXPECTED   ng/mg creat   Oxymorphone                    2240         EXPECTED   ng/mg creat   Noroxycodone                   5093         EXPECTED   ng/mg creat   Noroxymorphone                 968          EXPECTED   ng/mg creat    Sources of oxycodone  are scheduled prescription medications.    Oxymorphone, noroxycodone, and noroxymorphone are expected    metabolites of oxycodone . Oxymorphone is also available as a    scheduled prescription medication.  Drug Absent but Declared for Prescription Verification   Temazepam                      Not Detected UNEXPECTED ng/mg  creat ==================================================================== Test                      Result    Flag   Units      Ref Range   Creatinine              104              mg/dL      >=79 ==================================================================== Declared Medications:  The flagging and interpretation on this report are based on the  following declared medications.  Unexpected results may arise from  inaccuracies in the declared medications.   **Note: The testing scope of this panel includes these medications:   Oxycodone   Temazepam (Restoril)   **Note: The testing scope of this panel does not include the  following reported medications:   Albuterol  (Ventolin  HFA)  Cholecalciferol  Levothyroxine (Synthroid)  Loratadine (Claritin)  Metoprolol (Toprol)  Multivitamin  Naloxone  (Narcan )  Tizanidine  (Zanaflex )  Vitamin B  Vitamin D2 (Ergocalciferol ) ==================================================================== For clinical consultation, please call [phone]. ====================================================================     No results found for: CBDTHCR No results found for: D8THCCBX No results found for: D9THCCBX  ROS  Constitutional: Denies any fever or chills Gastrointestinal: No reported hemesis, hematochezia, vomiting, or acute GI distress Musculoskeletal: Back Pain, Leg Pain (bilateral), Neck Pain, and Arm Pain (bilateral) Neurological: No reported episodes of acute onset apraxia, aphasia, dysarthria, agnosia, amnesia, paralysis, loss of coordination, or loss of consciousness  Medication Review  Cholecalciferol, Ergocalciferol , Oxycodone  HCl, albuterol , amLODipine, b complex vitamins, levothyroxine, loratadine, methotrexate, metoprolol succinate, multivitamin, naloxone , nitroGLYCERIN, predniSONE, and tiZANidine   History Review  Allergy: Mr. Lightsey is allergic to gabapentin, penicillins, lactose, nsaids, talwin  [pentazocine], codeine, procaine, and sulfa antibiotics. Drug: Mr. Stolar  reports no history of drug use. Alcohol:  reports no history of alcohol use. Tobacco:  reports that he has quit smoking. His smoking use included cigarettes. His smokeless tobacco use includes chew. Social: Mr. Nazir  reports that he has quit smoking. His smoking use included cigarettes. His smokeless tobacco use includes chew. He reports that he does not drink alcohol and does not use drugs. 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 Surgical: Mr. Gadd  has a past surgical history that includes Spine surgery; Eye surgery; Fracture surgery (Right); Fracture surgery (Bilateral); Fracture surgery (Bilateral); Joint replacement (Right); Spinal cord stimulator implant; Spinal cord stimulator removal; Patella arthroplasty; Shoulder arthroscopy (Bilateral); Esophagogastroduodenoscopy (egd) with propofol  (N/A, 01/20/2016); and Colonoscopy (2009). Family: family history includes Arthritis in his father and mother; COPD in his father; Cancer in his father and mother; Diabetes in his mother; Hematuria in his father and mother; Hypertension in his father and mother; Kidney disease in his mother; Kidney failure in his mother; Prostate cancer in his father; Stroke in his mother.  Laboratory Chemistry Profile   Renal Lab Results  Component Value Date   BUN 15 08/19/2019   CREATININE 1.47 (H) 08/19/2019   GFRAA 59  (L) 08/19/2019   GFRNONAA 51 (L) 08/19/2019    Hepatic Lab Results  Component Value Date   AST 25 08/19/2019   ALT 24 08/19/2019   ALBUMIN 4.2 08/19/2019   ALKPHOS 47 08/19/2019    Electrolytes Lab Results  Component Value Date   NA 138 08/19/2019   K 4.1 08/19/2019   CL 100 08/19/2019   CALCIUM 9.3 08/19/2019   MG 2.1 08/19/2019    Bone Lab Results  Component Value Date   VD25OH 26.19 (L) 08/19/2019   25OHVITD1 42 12/06/2016   25OHVITD2 <1.0 12/06/2016   25OHVITD3 42 12/06/2016   TESTOSTERONE  285 02/12/2018    Inflammation (CRP: Acute Phase) (ESR: Chronic Phase) Lab Results  Component Value Date   CRP <0.8 08/19/2019   ESRSEDRATE 1 08/19/2019         Note: Above Lab results reviewed.  Recent Imaging Review  DG PAIN CLINIC C-ARM 1-60 MIN NO REPORT Fluoro was used, but no Radiologist interpretation will be provided.  Please refer to NOTES tab for provider progress note. Note: Reviewed  Physical Exam  Vitals: BP (!) 147/84   Pulse 78   Temp (!) 97.2 F (36.2 C)   Resp 18   Ht 6' (1.829 m)   Wt 270 lb (122.5 kg)   SpO2 97%   BMI 36.62 kg/m  BMI: Estimated body mass index is 36.62 kg/m as calculated from the following:   Height as of this encounter: 6' (1.829 m).   Weight as of this encounter: 270 lb (122.5 kg). Ideal: Ideal body weight: 77.6 kg (171 lb 1.2 oz) Adjusted ideal body weight: 95.5 kg (210 lb 10.3 oz) General appearance: Well nourished, well developed, and well hydrated. In no apparent acute distress Mental status: Alert, oriented x 3 (person, place, & time)       Respiratory: No evidence of acute respiratory distress Eyes: PERLA  Musculoskeletal: +LBP Bilateral leg pain, neck pain Assessment   Diagnosis Status  1. Chronic low back pain (1ry area of Pain) (Bilateral) (R>L)   2. Pharmacologic therapy   3. Chronic use of opiate for therapeutic purpose   4. Chronic lower extremity pain (2ry area of Pain) (Bilateral) (L>R)   5.  Cervicalgia (Right)   6. Cervical spondylosis (C7 intravertebral body cyst)   7. Chronic knee pain (Bilateral) (R>L)   8. Failed back surgical syndrome (2001 by Dr. Carlin Hawthorne)   9. Chronic cervical radicular pain (Right)   10. Chronic pain syndrome   11. Encounter for medication management   12. Encounter for chronic pain management    Controlled Controlled Controlled   Updated Problems: No problems updated.  Plan of Care  Problem-specific:  Assessment and Plan    Chronic pain syndrome with postlaminectomy syndrome, cervical spondylosis and radiculopathy, low back pain, and right leg and knee pain Chronic pain syndrome with multiple contributing factors including postlaminectomy syndrome, cervical spondylosis with radiculopathy, low back pain, and right leg and knee pain. Severe muscle spasms disrupt sleep. Current management includes tizanidine  and oxycodone  20 mg every 8 hours. - Continue oxycodone  20 mg every 8 hours for pain management. - Ensure adequate intake of magnesium and vitamin D  to support muscle function and overall health. - Sent prescription to pharmacy.   Pharmacotherapy: Patient's pain is controlled with oxycodone , will continue on current medication regimen.  Prescribing drug monitoring (PDMP) reviewed, findings consistent with the use of prescribed medication and no evidence of narcotic misuse or abuse.  Urine drug screening (UDS) up-to-date.  No adverse reaction or side effects reported to medication.  The patient was advised to intake of magnesium and vitamin D  daily to support muscle function and overall health.  Schedule follow-up in 90 days for medication management.      Mr. MARCUS SCHWANDT has a current medication list which includes the following long-term medication(s): albuterol , amlodipine, naloxone , nitroglycerin, tizanidine , [START ON 10/11/2024] oxycodone  hcl, [START ON 11/10/2024] oxycodone  hcl, and [START ON 12/10/2024] oxycodone   hcl.  Pharmacotherapy (Medications Ordered): Meds ordered this encounter  Medications   Oxycodone  HCl 20 MG TABS    Sig: Take 1 tablet (20 mg total) by mouth every 8 (eight) hours as needed. Must last 30 days    Dispense:  90 tablet    Refill:  0    DO NOT: delete (not duplicate); no partial-fill (will deny script to complete), no refill request (F/U required). DISPENSE: 1 day early if closed on fill date. WARN: No CNS-depressants within 8 hrs of med.   Oxycodone  HCl 20 MG TABS    Sig: Take 1 tablet (20  mg total) by mouth every 8 (eight) hours as needed. Must last 30 days    Dispense:  90 tablet    Refill:  0    DO NOT: delete (not duplicate); no partial-fill (will deny script to complete), no refill request (F/U required). DISPENSE: 1 day early if closed on fill date. WARN: No CNS-depressants within 8 hrs of med.   Oxycodone  HCl 20 MG TABS    Sig: Take 1 tablet (20 mg total) by mouth every 8 (eight) hours as needed. Must last 30 days    Dispense:  90 tablet    Refill:  0    DO NOT: delete (not duplicate); no partial-fill (will deny script to complete), no refill request (F/U required). DISPENSE: 1 day early if closed on fill date. WARN: No CNS-depressants within 8 hrs of med.   Orders:  No orders of the defined types were placed in this encounter.       Return in about 3 months (around 12/29/2024) for (F2F), (MM), Emmy Blanch NP.    Recent Visits Date Type Provider Dept  09/23/24 Office Visit Tanya Glisson, MD [facility]  09/01/24 Procedure visit Tanya Glisson, MD [facility]  08/19/24 Office Visit Tanya Glisson, MD [facility]  08/13/24 Office Visit Shelina Luo K, NP [facility]  07/07/24 Office Visit Josilyn Shippee K, NP [facility]  Showing recent visits within past 90 days and meeting all other requirements Today's Visits Date Type Provider Dept  09/28/24 Office Visit Sydna Brodowski K, NP [facility]   Showing today's visits and meeting all other requirements Future Appointments No visits were found meeting these conditions. Showing future appointments within next 90 days and meeting all other requirements  I discussed the assessment and treatment plan with the patient. The patient was provided an opportunity to ask questions and all were answered. The patient agreed with the plan and demonstrated an understanding of the instructions.  Patient advised to call back or seek an in-person evaluation if the symptoms or condition worsens.  I personally spent a total of 30 minutes in the care of the patient today including preparing to see the patient, getting/reviewing separately obtained history, performing a medically appropriate exam/evaluation, counseling and educating, placing orders, referring and communicating with other health care professionals, documenting clinical information in the EHR, independently interpreting results, communicating results, and coordinating care.   Note by: Ariabella Brien K Arisa Congleton, NP (TTS and AI technology used. I apologize for any typographical errors that were not detected and corrected.) Date: 09/28/2024; Time: 12:07 PM

## 2024-09-28 NOTE — Progress Notes (Signed)
 Nursing Pain Medication Assessment:  Safety precautions to be maintained throughout the outpatient stay will include: orient to surroundings, keep bed in low position, maintain call bell within reach at all times, provide assistance with transfer out of bed and ambulation.  Medication Inspection Compliance: Pill count conducted under aseptic conditions, in front of the patient. Neither the pills nor the bottle was removed from the patient's sight at any time. Once count was completed pills were immediately returned to the patient in their original bottle.  Medication: Oxycodone  IR Pill/Patch Count: 42 of 90 pills/patches remain Pill/Patch Appearance: Markings consistent with prescribed medication Bottle Appearance: Standard pharmacy container. Clearly labeled. Filled Date: 75 / 07 / 2025 Last Medication intake:  Today

## 2024-09-30 ENCOUNTER — Telehealth: Payer: Self-pay | Admitting: Nurse Practitioner

## 2024-09-30 NOTE — Telephone Encounter (Signed)
 Patient states all his meds need PA

## 2024-09-30 NOTE — Telephone Encounter (Signed)
 I worked on this with patient and has been resolved.  No PA required and Logan at [facility] was able to locate Rx's.  Patient aware of all information.

## 2024-10-12 ENCOUNTER — Telehealth: Payer: Self-pay

## 2024-10-12 NOTE — Telephone Encounter (Signed)
 Patient called. States he called pharmacy and they stated the PA had been sent to us  via the pharmacy for his oxycodone .. I checked, not here. Lori checked CMM.

## 2024-10-12 NOTE — Telephone Encounter (Signed)
 He is having trouble getting his medicines filled due to prior auth.The pharmacy has sent us  a pa request so he needs us  to do it asap. He said last month someone checked and said he didn't need prior auth but the pharmacy is saying he does. He called the insurance company and they gave him this ID number for the pa request. [phone]

## 2024-10-13 LAB — GENECONNECT MOLECULAR SCREEN: Genetic Analysis Overall Interpretation: NEGATIVE

## 2024-11-10 ENCOUNTER — Telehealth: Payer: Self-pay

## 2024-11-10 NOTE — Telephone Encounter (Signed)
 Patient tried to get his oxycodone  filled today and the pharmacy told her it does require prior auth, the the insurance requires it. Can someone look into this again

## 2024-11-10 NOTE — Telephone Encounter (Signed)
 This has been addressed. See note in chart.

## 2024-11-10 NOTE — Telephone Encounter (Signed)
 He would like another hip injection if you will put an order in please

## 2024-12-29 ENCOUNTER — Encounter: Admitting: Nurse Practitioner

## 2024-12-30 ENCOUNTER — Ambulatory Visit: Admit: 2024-12-30 | Admitting: Gastroenterology

## 2024-12-30 ENCOUNTER — Ambulatory Visit: Admission: RE | Admit: 2024-12-30 | Source: Home / Self Care | Admitting: Gastroenterology

## 2024-12-30 ENCOUNTER — Encounter: Admission: RE | Payer: Self-pay | Source: Home / Self Care

## 2024-12-30 SURGERY — COLONOSCOPY
Anesthesia: General
